# Patient Record
Sex: Female | Born: 1975 | Race: Black or African American | Hispanic: No | Marital: Single | Smoking: Former smoker
Health system: Southern US, Community
[De-identification: ages and names within clinical notes are randomized; demographics above are authoritative.]

## PROBLEM LIST (undated history)

## (undated) DIAGNOSIS — N61 Mastitis without abscess: Secondary | ICD-10-CM

## (undated) DIAGNOSIS — G4730 Sleep apnea, unspecified: Secondary | ICD-10-CM

## (undated) DIAGNOSIS — F1010 Alcohol abuse, uncomplicated: Secondary | ICD-10-CM

## (undated) DIAGNOSIS — F329 Major depressive disorder, single episode, unspecified: Secondary | ICD-10-CM

## (undated) DIAGNOSIS — E739 Lactose intolerance, unspecified: Secondary | ICD-10-CM

## (undated) DIAGNOSIS — F419 Anxiety disorder, unspecified: Secondary | ICD-10-CM

## (undated) DIAGNOSIS — F4310 Post-traumatic stress disorder, unspecified: Secondary | ICD-10-CM

## (undated) DIAGNOSIS — E669 Obesity, unspecified: Secondary | ICD-10-CM

## (undated) DIAGNOSIS — I10 Essential (primary) hypertension: Secondary | ICD-10-CM

## (undated) DIAGNOSIS — M2550 Pain in unspecified joint: Secondary | ICD-10-CM

## (undated) DIAGNOSIS — N611 Abscess of the breast and nipple: Secondary | ICD-10-CM

## (undated) DIAGNOSIS — M549 Dorsalgia, unspecified: Secondary | ICD-10-CM

## (undated) DIAGNOSIS — R600 Localized edema: Secondary | ICD-10-CM

## (undated) DIAGNOSIS — R1310 Dysphagia, unspecified: Secondary | ICD-10-CM

## (undated) DIAGNOSIS — D259 Leiomyoma of uterus, unspecified: Secondary | ICD-10-CM

## (undated) DIAGNOSIS — Z720 Tobacco use: Secondary | ICD-10-CM

## (undated) DIAGNOSIS — K829 Disease of gallbladder, unspecified: Secondary | ICD-10-CM

## (undated) DIAGNOSIS — E282 Polycystic ovarian syndrome: Secondary | ICD-10-CM

## (undated) DIAGNOSIS — K5900 Constipation, unspecified: Secondary | ICD-10-CM

## (undated) DIAGNOSIS — K219 Gastro-esophageal reflux disease without esophagitis: Secondary | ICD-10-CM

## (undated) DIAGNOSIS — R7303 Prediabetes: Secondary | ICD-10-CM

## (undated) DIAGNOSIS — F32A Depression, unspecified: Secondary | ICD-10-CM

## (undated) HISTORY — DX: Localized edema: R60.0

## (undated) HISTORY — DX: Tobacco use: Z72.0

## (undated) HISTORY — DX: Alcohol abuse, uncomplicated: F10.10

## (undated) HISTORY — DX: Depression, unspecified: F32.A

## (undated) HISTORY — PX: INCISION AND DRAINAGE BREAST ABSCESS: SUR672

## (undated) HISTORY — DX: Dorsalgia, unspecified: M54.9

## (undated) HISTORY — DX: Constipation, unspecified: K59.00

## (undated) HISTORY — DX: Dysphagia, unspecified: R13.10

## (undated) HISTORY — DX: Post-traumatic stress disorder, unspecified: F43.10

## (undated) HISTORY — DX: Essential (primary) hypertension: I10

## (undated) HISTORY — DX: Lactose intolerance, unspecified: E73.9

## (undated) HISTORY — DX: Anxiety disorder, unspecified: F41.9

## (undated) HISTORY — DX: Pain in unspecified joint: M25.50

## (undated) HISTORY — DX: Obesity, unspecified: E66.9

## (undated) HISTORY — DX: Disease of gallbladder, unspecified: K82.9

## (undated) HISTORY — DX: Polycystic ovarian syndrome: E28.2

## (undated) HISTORY — DX: Gastro-esophageal reflux disease without esophagitis: K21.9

## (undated) HISTORY — DX: Major depressive disorder, single episode, unspecified: F32.9

## (undated) HISTORY — DX: Abscess of the breast and nipple: N61.1

---

## 1999-07-16 ENCOUNTER — Emergency Department (HOSPITAL_COMMUNITY): Admission: EM | Admit: 1999-07-16 | Discharge: 1999-07-16 | Payer: Self-pay | Admitting: Emergency Medicine

## 1999-09-15 ENCOUNTER — Encounter: Payer: Self-pay | Admitting: Emergency Medicine

## 1999-09-15 ENCOUNTER — Emergency Department (HOSPITAL_COMMUNITY): Admission: EM | Admit: 1999-09-15 | Discharge: 1999-09-15 | Payer: Self-pay | Admitting: Emergency Medicine

## 1999-09-23 ENCOUNTER — Encounter (INDEPENDENT_AMBULATORY_CARE_PROVIDER_SITE_OTHER): Payer: Self-pay | Admitting: Specialist

## 1999-09-23 ENCOUNTER — Ambulatory Visit (HOSPITAL_COMMUNITY): Admission: RE | Admit: 1999-09-23 | Discharge: 1999-09-24 | Payer: Self-pay | Admitting: General Surgery

## 1999-09-23 ENCOUNTER — Encounter: Payer: Self-pay | Admitting: General Surgery

## 2000-03-31 HISTORY — PX: CHOLECYSTECTOMY: SHX55

## 2000-05-06 ENCOUNTER — Emergency Department (HOSPITAL_COMMUNITY): Admission: EM | Admit: 2000-05-06 | Discharge: 2000-05-06 | Payer: Self-pay | Admitting: Emergency Medicine

## 2001-09-13 ENCOUNTER — Emergency Department (HOSPITAL_COMMUNITY): Admission: EM | Admit: 2001-09-13 | Discharge: 2001-09-13 | Payer: Self-pay | Admitting: Emergency Medicine

## 2001-09-19 ENCOUNTER — Emergency Department (HOSPITAL_COMMUNITY): Admission: EM | Admit: 2001-09-19 | Discharge: 2001-09-19 | Payer: Self-pay

## 2002-03-25 ENCOUNTER — Emergency Department (HOSPITAL_COMMUNITY): Admission: EM | Admit: 2002-03-25 | Discharge: 2002-03-25 | Payer: Self-pay | Admitting: Emergency Medicine

## 2002-04-23 ENCOUNTER — Emergency Department (HOSPITAL_COMMUNITY): Admission: EM | Admit: 2002-04-23 | Discharge: 2002-04-24 | Payer: Self-pay | Admitting: Emergency Medicine

## 2002-12-12 ENCOUNTER — Emergency Department (HOSPITAL_COMMUNITY): Admission: EM | Admit: 2002-12-12 | Discharge: 2002-12-12 | Payer: Self-pay | Admitting: Emergency Medicine

## 2003-04-09 ENCOUNTER — Emergency Department (HOSPITAL_COMMUNITY): Admission: EM | Admit: 2003-04-09 | Discharge: 2003-04-09 | Payer: Self-pay | Admitting: Emergency Medicine

## 2003-06-11 ENCOUNTER — Emergency Department (HOSPITAL_COMMUNITY): Admission: EM | Admit: 2003-06-11 | Discharge: 2003-06-11 | Payer: Self-pay | Admitting: Emergency Medicine

## 2004-01-10 ENCOUNTER — Emergency Department (HOSPITAL_COMMUNITY): Admission: EM | Admit: 2004-01-10 | Discharge: 2004-01-10 | Payer: Self-pay | Admitting: Emergency Medicine

## 2004-06-29 ENCOUNTER — Emergency Department (HOSPITAL_COMMUNITY): Admission: EM | Admit: 2004-06-29 | Discharge: 2004-06-29 | Payer: Self-pay | Admitting: Emergency Medicine

## 2004-08-08 ENCOUNTER — Ambulatory Visit: Payer: Self-pay | Admitting: Family Medicine

## 2004-11-20 ENCOUNTER — Emergency Department (HOSPITAL_COMMUNITY): Admission: EM | Admit: 2004-11-20 | Discharge: 2004-11-21 | Payer: Self-pay | Admitting: *Deleted

## 2005-02-04 ENCOUNTER — Emergency Department (HOSPITAL_COMMUNITY): Admission: EM | Admit: 2005-02-04 | Discharge: 2005-02-05 | Payer: Self-pay | Admitting: Emergency Medicine

## 2006-05-28 DIAGNOSIS — F411 Generalized anxiety disorder: Secondary | ICD-10-CM | POA: Insufficient documentation

## 2006-05-28 DIAGNOSIS — K219 Gastro-esophageal reflux disease without esophagitis: Secondary | ICD-10-CM | POA: Insufficient documentation

## 2006-05-28 DIAGNOSIS — I10 Essential (primary) hypertension: Secondary | ICD-10-CM | POA: Insufficient documentation

## 2006-11-08 ENCOUNTER — Emergency Department (HOSPITAL_COMMUNITY): Admission: EM | Admit: 2006-11-08 | Discharge: 2006-11-09 | Payer: Self-pay | Admitting: Emergency Medicine

## 2006-12-11 ENCOUNTER — Ambulatory Visit: Payer: Self-pay | Admitting: Family Medicine

## 2006-12-11 ENCOUNTER — Encounter (INDEPENDENT_AMBULATORY_CARE_PROVIDER_SITE_OTHER): Payer: Self-pay | Admitting: *Deleted

## 2006-12-11 ENCOUNTER — Other Ambulatory Visit: Admission: RE | Admit: 2006-12-11 | Discharge: 2006-12-11 | Payer: Self-pay | Admitting: *Deleted

## 2006-12-11 ENCOUNTER — Telehealth (INDEPENDENT_AMBULATORY_CARE_PROVIDER_SITE_OTHER): Payer: Self-pay | Admitting: *Deleted

## 2006-12-11 LAB — CONVERTED CEMR LAB
Blood in Urine, dipstick: NEGATIVE
Chlamydia, DNA Probe: NEGATIVE
Nitrite: NEGATIVE
Protein, U semiquant: NEGATIVE
Urobilinogen, UA: 0.2
pH: 5.5

## 2006-12-15 ENCOUNTER — Encounter (INDEPENDENT_AMBULATORY_CARE_PROVIDER_SITE_OTHER): Payer: Self-pay | Admitting: *Deleted

## 2006-12-18 ENCOUNTER — Encounter (INDEPENDENT_AMBULATORY_CARE_PROVIDER_SITE_OTHER): Payer: Self-pay | Admitting: *Deleted

## 2007-01-07 ENCOUNTER — Ambulatory Visit: Payer: Self-pay | Admitting: Family Medicine

## 2007-01-08 ENCOUNTER — Encounter: Admission: RE | Admit: 2007-01-08 | Discharge: 2007-01-08 | Payer: Self-pay | Admitting: Sports Medicine

## 2007-01-11 ENCOUNTER — Encounter: Payer: Self-pay | Admitting: Family Medicine

## 2007-01-20 ENCOUNTER — Encounter: Payer: Self-pay | Admitting: Family Medicine

## 2007-01-20 ENCOUNTER — Ambulatory Visit: Payer: Self-pay | Admitting: Family Medicine

## 2007-01-20 LAB — CONVERTED CEMR LAB
ALT: 25 units/L (ref 0–35)
AST: 17 units/L (ref 0–37)
BUN: 10 mg/dL (ref 6–23)
Calcium: 9.2 mg/dL (ref 8.4–10.5)
Chloride: 106 meq/L (ref 96–112)
Cholesterol: 165 mg/dL (ref 0–200)
Creatinine, Ser: 0.91 mg/dL (ref 0.40–1.20)
HDL: 46 mg/dL (ref >39)
Total Bilirubin: 0.2 mg/dL — ABNORMAL LOW (ref 0.3–1.2)
Total CHOL/HDL Ratio: 3.6
VLDL: 17 mg/dL (ref 0–40)
Whiff Test: NEGATIVE

## 2007-02-03 ENCOUNTER — Ambulatory Visit: Payer: Self-pay | Admitting: Family Medicine

## 2007-02-03 DIAGNOSIS — Z6841 Body Mass Index (BMI) 40.0 and over, adult: Secondary | ICD-10-CM | POA: Insufficient documentation

## 2007-03-05 ENCOUNTER — Encounter (INDEPENDENT_AMBULATORY_CARE_PROVIDER_SITE_OTHER): Payer: Self-pay | Admitting: *Deleted

## 2007-04-27 ENCOUNTER — Ambulatory Visit: Payer: Self-pay | Admitting: Family Medicine

## 2007-04-27 ENCOUNTER — Encounter (INDEPENDENT_AMBULATORY_CARE_PROVIDER_SITE_OTHER): Payer: Self-pay | Admitting: Family Medicine

## 2007-04-29 ENCOUNTER — Telehealth: Payer: Self-pay | Admitting: *Deleted

## 2007-05-03 ENCOUNTER — Telehealth: Payer: Self-pay | Admitting: *Deleted

## 2007-05-07 ENCOUNTER — Encounter: Admission: RE | Admit: 2007-05-07 | Discharge: 2007-05-07 | Payer: Self-pay | Admitting: Family Medicine

## 2007-05-10 ENCOUNTER — Ambulatory Visit: Payer: Self-pay | Admitting: Sports Medicine

## 2007-05-13 ENCOUNTER — Telehealth: Payer: Self-pay | Admitting: *Deleted

## 2007-06-15 ENCOUNTER — Telehealth (INDEPENDENT_AMBULATORY_CARE_PROVIDER_SITE_OTHER): Payer: Self-pay | Admitting: Family Medicine

## 2007-06-22 ENCOUNTER — Emergency Department (HOSPITAL_COMMUNITY): Admission: EM | Admit: 2007-06-22 | Discharge: 2007-06-22 | Payer: Self-pay | Admitting: Emergency Medicine

## 2007-08-22 ENCOUNTER — Emergency Department (HOSPITAL_COMMUNITY): Admission: EM | Admit: 2007-08-22 | Discharge: 2007-08-22 | Payer: Self-pay | Admitting: Emergency Medicine

## 2007-10-26 ENCOUNTER — Telehealth: Payer: Self-pay | Admitting: Family Medicine

## 2007-10-28 ENCOUNTER — Emergency Department (HOSPITAL_COMMUNITY): Admission: EM | Admit: 2007-10-28 | Discharge: 2007-10-28 | Payer: Self-pay | Admitting: Family Medicine

## 2008-01-04 ENCOUNTER — Telehealth: Payer: Self-pay | Admitting: *Deleted

## 2008-05-11 ENCOUNTER — Telehealth: Payer: Self-pay | Admitting: Family Medicine

## 2008-05-11 ENCOUNTER — Ambulatory Visit: Payer: Self-pay | Admitting: Family Medicine

## 2008-05-15 ENCOUNTER — Telehealth (INDEPENDENT_AMBULATORY_CARE_PROVIDER_SITE_OTHER): Payer: Self-pay | Admitting: *Deleted

## 2008-05-17 ENCOUNTER — Telehealth (INDEPENDENT_AMBULATORY_CARE_PROVIDER_SITE_OTHER): Payer: Self-pay | Admitting: *Deleted

## 2008-05-23 ENCOUNTER — Encounter: Payer: Self-pay | Admitting: Family Medicine

## 2008-07-10 ENCOUNTER — Emergency Department (HOSPITAL_COMMUNITY): Admission: EM | Admit: 2008-07-10 | Discharge: 2008-07-10 | Payer: Self-pay | Admitting: *Deleted

## 2008-09-23 ENCOUNTER — Emergency Department (HOSPITAL_COMMUNITY): Admission: EM | Admit: 2008-09-23 | Discharge: 2008-09-23 | Payer: Self-pay | Admitting: Emergency Medicine

## 2008-11-01 ENCOUNTER — Ambulatory Visit (HOSPITAL_COMMUNITY): Admission: RE | Admit: 2008-11-01 | Discharge: 2008-11-01 | Payer: Self-pay | Admitting: Family Medicine

## 2008-11-01 ENCOUNTER — Telehealth: Payer: Self-pay | Admitting: *Deleted

## 2008-11-01 ENCOUNTER — Telehealth (INDEPENDENT_AMBULATORY_CARE_PROVIDER_SITE_OTHER): Payer: Self-pay | Admitting: *Deleted

## 2008-11-01 ENCOUNTER — Ambulatory Visit: Payer: Self-pay | Admitting: Family Medicine

## 2008-11-01 DIAGNOSIS — F17200 Nicotine dependence, unspecified, uncomplicated: Secondary | ICD-10-CM

## 2008-11-03 ENCOUNTER — Encounter: Payer: Self-pay | Admitting: Family Medicine

## 2008-11-06 ENCOUNTER — Encounter: Payer: Self-pay | Admitting: Family Medicine

## 2008-11-06 ENCOUNTER — Ambulatory Visit: Payer: Self-pay | Admitting: Family Medicine

## 2008-11-06 ENCOUNTER — Other Ambulatory Visit: Admission: RE | Admit: 2008-11-06 | Discharge: 2008-11-06 | Payer: Self-pay | Admitting: *Deleted

## 2008-11-06 DIAGNOSIS — L301 Dyshidrosis [pompholyx]: Secondary | ICD-10-CM

## 2008-11-08 ENCOUNTER — Emergency Department (HOSPITAL_COMMUNITY): Admission: EM | Admit: 2008-11-08 | Discharge: 2008-11-09 | Payer: Self-pay | Admitting: Emergency Medicine

## 2008-11-08 ENCOUNTER — Encounter: Payer: Self-pay | Admitting: Family Medicine

## 2008-11-16 ENCOUNTER — Encounter: Payer: Self-pay | Admitting: Family Medicine

## 2008-11-24 ENCOUNTER — Ambulatory Visit: Payer: Self-pay | Admitting: Family Medicine

## 2008-12-14 ENCOUNTER — Ambulatory Visit: Payer: Self-pay | Admitting: Family Medicine

## 2008-12-17 ENCOUNTER — Telehealth: Payer: Self-pay | Admitting: Family Medicine

## 2008-12-17 ENCOUNTER — Emergency Department (HOSPITAL_COMMUNITY): Admission: EM | Admit: 2008-12-17 | Discharge: 2008-12-17 | Payer: Self-pay | Admitting: Emergency Medicine

## 2008-12-18 ENCOUNTER — Encounter: Payer: Self-pay | Admitting: Family Medicine

## 2008-12-18 ENCOUNTER — Ambulatory Visit: Payer: Self-pay | Admitting: Family Medicine

## 2008-12-18 ENCOUNTER — Encounter: Payer: Self-pay | Admitting: *Deleted

## 2008-12-18 ENCOUNTER — Telehealth: Payer: Self-pay | Admitting: Family Medicine

## 2008-12-18 ENCOUNTER — Encounter: Admission: RE | Admit: 2008-12-18 | Discharge: 2008-12-18 | Payer: Self-pay | Admitting: Family Medicine

## 2008-12-18 DIAGNOSIS — N61 Mastitis without abscess: Secondary | ICD-10-CM

## 2009-01-04 ENCOUNTER — Telehealth: Payer: Self-pay | Admitting: Family Medicine

## 2009-04-23 ENCOUNTER — Telehealth: Payer: Self-pay | Admitting: *Deleted

## 2009-05-25 ENCOUNTER — Ambulatory Visit: Payer: Self-pay | Admitting: Family Medicine

## 2009-06-01 ENCOUNTER — Ambulatory Visit: Payer: Self-pay | Admitting: Family Medicine

## 2009-06-30 ENCOUNTER — Emergency Department (HOSPITAL_COMMUNITY): Admission: EM | Admit: 2009-06-30 | Discharge: 2009-06-30 | Payer: Self-pay | Admitting: Emergency Medicine

## 2009-09-10 ENCOUNTER — Emergency Department (HOSPITAL_COMMUNITY): Admission: EM | Admit: 2009-09-10 | Discharge: 2009-09-10 | Payer: Self-pay | Admitting: Emergency Medicine

## 2010-02-17 ENCOUNTER — Emergency Department (HOSPITAL_COMMUNITY)
Admission: EM | Admit: 2010-02-17 | Discharge: 2010-02-17 | Payer: Self-pay | Source: Home / Self Care | Admitting: Emergency Medicine

## 2010-03-22 ENCOUNTER — Emergency Department (HOSPITAL_COMMUNITY)
Admission: EM | Admit: 2010-03-22 | Discharge: 2010-03-22 | Payer: Self-pay | Source: Home / Self Care | Admitting: Emergency Medicine

## 2010-04-21 ENCOUNTER — Encounter: Payer: Self-pay | Admitting: Family Medicine

## 2010-04-21 ENCOUNTER — Encounter: Payer: Self-pay | Admitting: Emergency Medicine

## 2010-04-22 ENCOUNTER — Encounter: Payer: Self-pay | Admitting: Family Medicine

## 2010-04-27 IMAGING — CR DG ABDOMEN ACUTE W/ 1V CHEST
3 series · 3 of 3 positions shown · non-contrast
Comparison: Chest dated 02/04/2005.

CLINICAL DATA: Right upper back pain.  Previous cholecystectomy.

ACUTE ABDOMEN SERIES (ABDOMEN 2 VIEW & CHEST 1 VIEW)

[w chest pa]
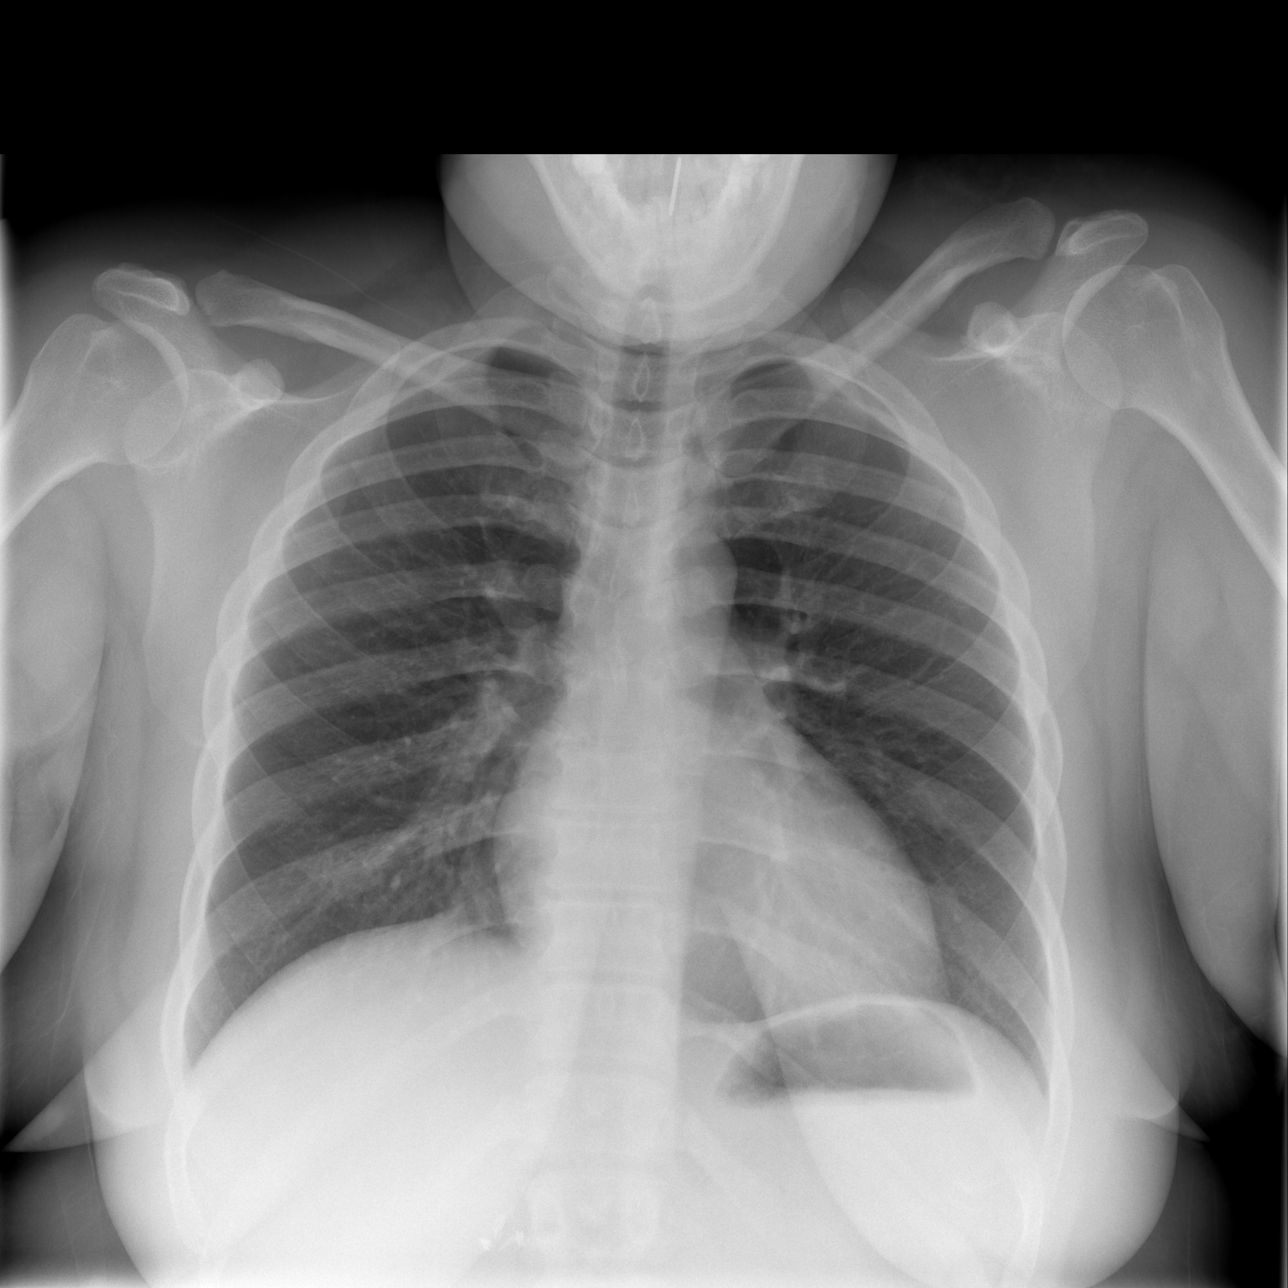

[w abdomen upright]
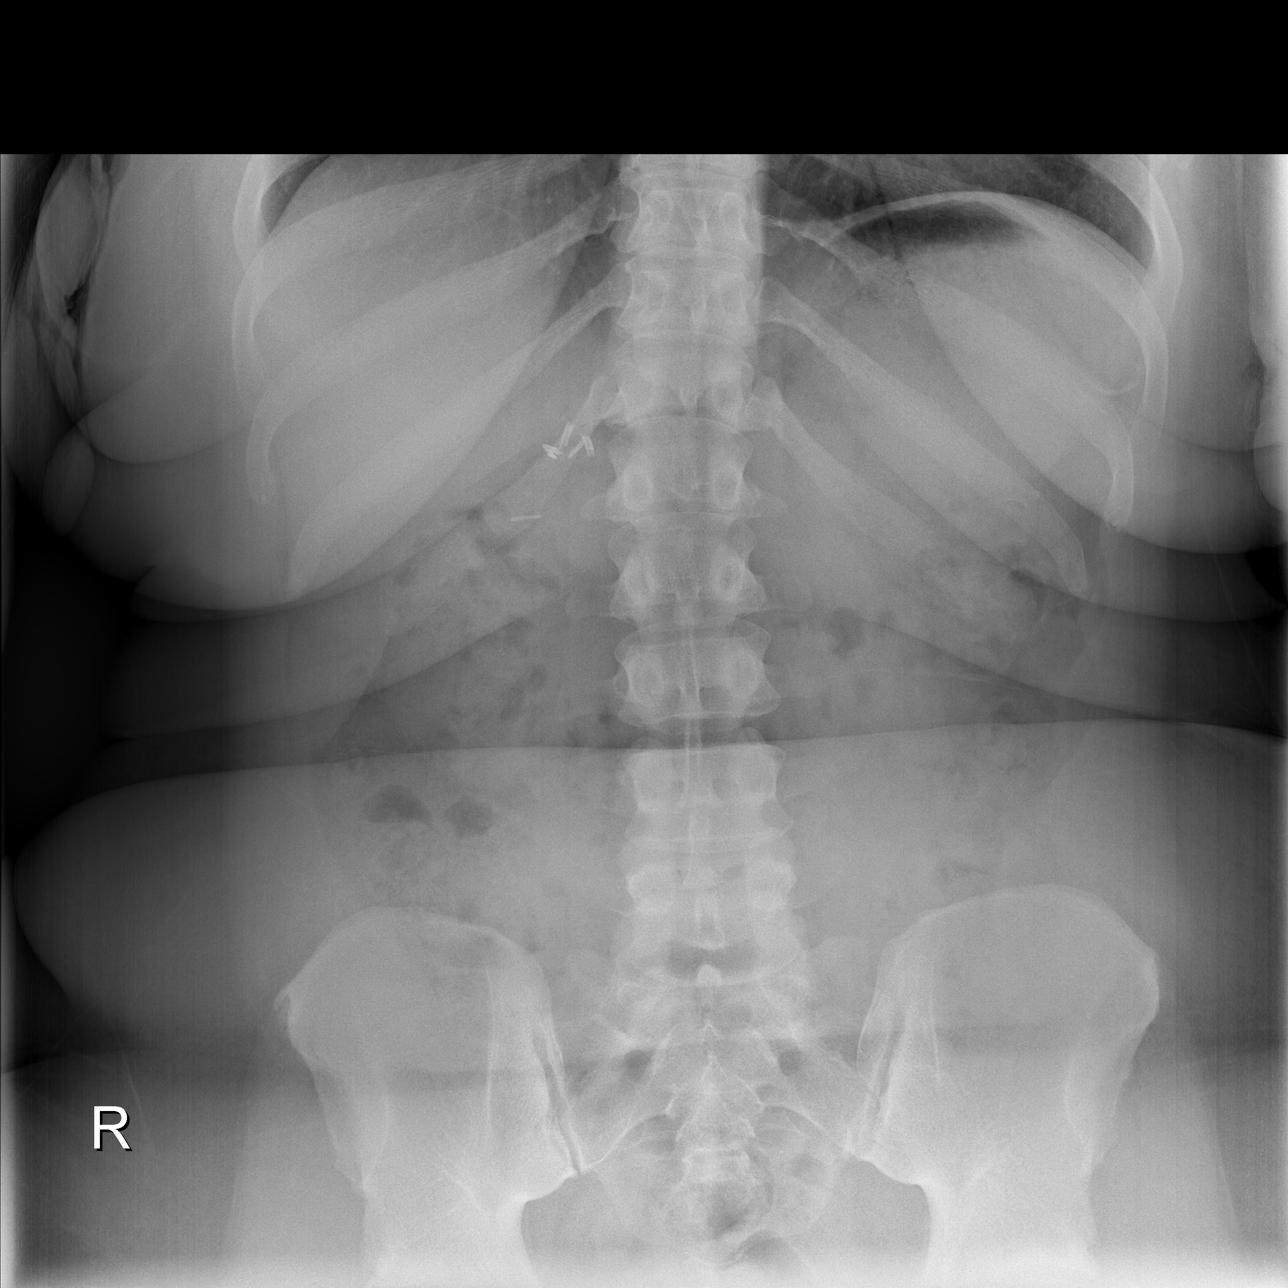

[t abdomen supine]
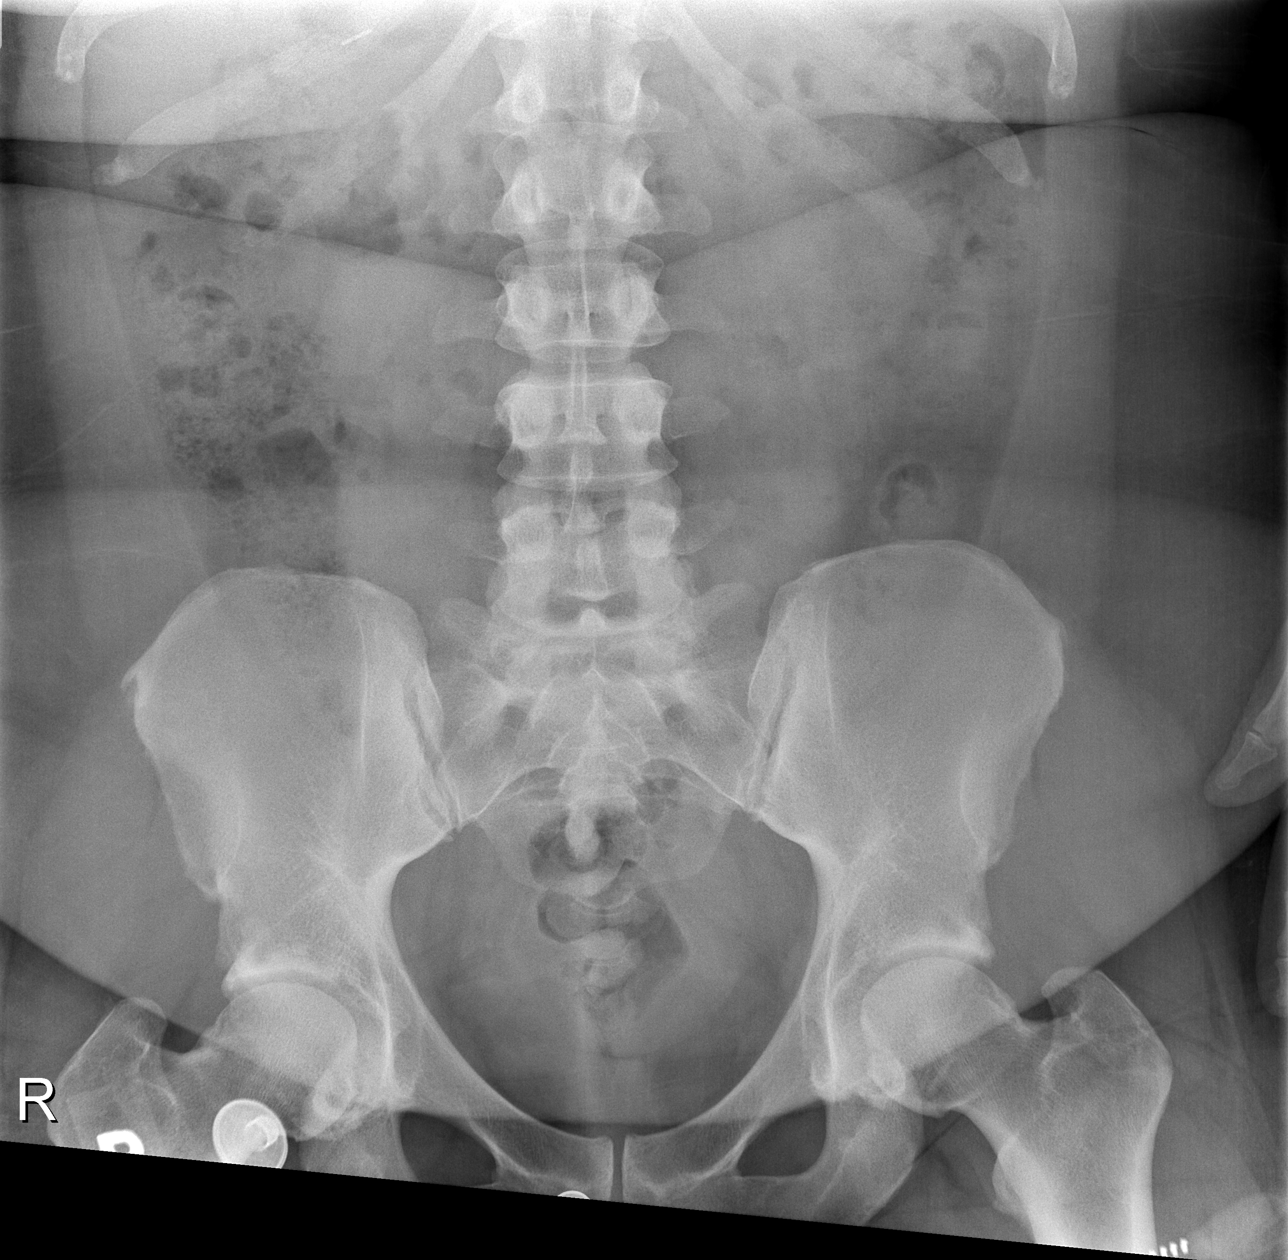

[3 of 3 positions shown; findings below may reference images not displayed]

FINDINGS: Normal sized heart.  Clear lungs.  Normal bowel gas
pattern without free peritoneal air.  Cholecystectomy clips.  Mild
scoliosis, possibly positional.
IMPRESSION: No acute abnormality.

## 2010-04-30 ENCOUNTER — Telehealth (INDEPENDENT_AMBULATORY_CARE_PROVIDER_SITE_OTHER): Payer: Self-pay | Admitting: *Deleted

## 2010-04-30 NOTE — Assessment & Plan Note (Signed)
Summary: bp ck,tcb  Nurse Visit patient in for BP check today because she didn't feel well this morning, "feels like I drank a lot last night and I didn't" feels tired.  In August 2010 she was given 3 refills on HCTZ and has stretched it until now. she has been only  taking off and on. she did take med  yesterday at 9:00 PM but not for few days prior. she currently has 18 tabs left. states she has no insurance and doesn't  qualify for medicaid. gave her Vernell Morgans number to  call. states she has had no period in 2 months . she is certain she is not pregnant due to her lifestyle.Marland Kitchen advised patient that an office visit costs $125.00 and they would like for her to pay half. states she has her tax money and will be able to do that. appointment scheduled next week with Dr. Sharen Hones. Dr. Sheffield Slider consulted on this . BP today 130/98 pulse 76 weight 255.2 Theresia Lo RN  May 25, 2009 12:07 PM   Allergies: No Known Drug Allergies  Orders Added: 1)  No Charge Patient Arrived (NCPA0) [NCPA0]

## 2010-04-30 NOTE — Assessment & Plan Note (Signed)
Summary: BP follow up and needs refill on med/ls   Vital Signs:  Patient profile:   35 year old female Height:      63 inches Weight:      257.6 pounds BMI:     45.80 Pulse rate:   81 / minute BP sitting:   120 / 80  (left arm) Cuff size:   large  Vitals Entered By: Renato Battles slade,cma CC: f/up HTN and refill meds. Is Patient Diabetic? No Pain Assessment Patient in pain? no        Primary Care Provider:  Ardeen Garland  MD  CC:  f/up HTN and refill meds..  History of Present Illness: CC: f/u BP and breast  1. HTN - Today 120/80.  HCTZ 25mg  daily and doing well with it, taking daily.  No recent HA changes, vision changes, CP/tightness, SOB, leg swelling or urinary changes.  Walks on treadmill 30 min every other day.    2. L breast - itching, h/o recurrent abscesses.  not swelling as much as prior to draining.  Last drained 11/2008.  saw surgeon who recommended dial soap and neosporin but per patient didn't provide any long term management plan.  now states draining intermittently, last drained this weekend.  no fevers or systemic symptoms  3. obesity - walking every other day.  Habits & Providers  Alcohol-Tobacco-Diet     Tobacco Status: current     Tobacco Counseling: to quit use of tobacco products  Allergies (verified): No Known Drug Allergies  Past History:  Past Medical History: htn, obesity, tobacco abuse recurrent Left breast abscess lateral to areola PMH-FH-SH reviewed for relevance  Physical Exam  General:  Alert and oriented x3, obese, no acute distress Breasts:  left breast with indurated area with scab no fluctuance lateral to areola.  pruritic.  no rash along skin contact Lungs:  Normal respiratory effort, chest expands symmetrically. Lungs are clear to auscultation, no crackles or wheezes. Heart:  Normal rate and regular rhythm. S1 and S2 normal without gallop, murmur, click, rub or other extra sounds. Extremities:  no edema Skin:  Intact without  suspicious lesions or rashes   Impression & Recommendations:  Problem # 1:  ABSCESS, BREAST, LEFT (ICD-611.0) treat with antibiotics as recently draining.  bactrim DS two times a day x 10 days to cover MRSA.  warm compresses.  use dial soap and neosporin.  pt states gets yeast infections with antibiotics so will provide diflucan ppx  Orders: [facility]- Est  Level 4 (45409)  Problem # 2:  OBESITY (ICD-278.00) continue treadmill walking. Orders: [facility]- Est  Level 4 (81191)  Problem # 3:  HYPERTENSION, BENIGN SYSTEMIC (ICD-401.1)  refilled HCTZ and encouraged BP at goal.  discussed importance of checking labs, pt states she cannot afford to obtain blood work today.  advised to eat plenty of fruits/vegetables to keep K at goal.  Advised HCTZ can drop K.  Her updated medication list for this problem includes:    Hydrochlorothiazide 25 Mg Tabs (Hydrochlorothiazide) .Marland Kitchen... 1 tab by mouth daily  Orders: [facility]- Est  Level 4 (99214)  BP today: 120/80 Prior BP: 110/90 (12/18/2008)  Labs Reviewed: K+: 4.0 (01/20/2007) Creat: : 0.91 (01/20/2007)   Chol: 165 (01/20/2007)   HDL: 46 (01/20/2007)   LDL: 102 (01/20/2007)   TG: 85 (01/20/2007)  Complete Medication List: 1)  Hydrochlorothiazide 25 Mg Tabs (Hydrochlorothiazide) .Marland Kitchen.. 1 tab by mouth daily 2)  Triamcinolone Acetonide 0.1 % Oint (Triamcinolone acetonide) .... Apply to feet once a day.  try to apply after bathing. disp: 80 g tube 3)  Bactrim Ds 800-160 Mg Tabs (Sulfamethoxazole-trimethoprim) .... 2 pills two times a day xx 10 days 4)  Diflucan 100 Mg Tabs (Fluconazole) .... One by mouth daily  Patient Instructions: 1)  I have refilled your HCTZ. 2)  Remember to take as much fruits and vegetables as possible - 3-5 servings daily. 3)  Keep up the good work with walking. 4)  Warm compresses.  Keep using dial soap and neosporin ointment for breast.  Antibiotic for 10 days bactrim 2 pills twice a day.  and Diflucan in case you get yeast  infection. 5)  Call clinic with questions. Prescriptions: DIFLUCAN 100 MG TABS (FLUCONAZOLE) one by mouth daily  #1 x 0   Entered and Authorized by:   Eustaquio Boyden  MD   Signed by:   Eustaquio Boyden  MD on 06/01/2009   Method used:   Electronically to        [facility] [address] (retail)       [address]       [place], [place]  [id number]       Ph: [phone]       Fax: [phone]   RxID:   [id number] BACTRIM DS 800-160 MG TABS (SULFAMETHOXAZOLE-TRIMETHOPRIM) 2 pills two times a day xx 10 days  #40 x 0   Entered and Authorized by:   Eustaquio Boyden  MD   Signed by:   Eustaquio Boyden  MD on 06/01/2009   Method used:   Electronically to        [facility] [facility] (retail)       [address]       [place], [place]  [id number]       Ph: [phone]       Fax: [phone]   RxID:   [id number] HYDROCHLOROTHIAZIDE 25 MG  TABS (HYDROCHLOROTHIAZIDE) 1 tab by mouth daily  #30 x 5   Entered and Authorized by:   Eustaquio Boyden  MD   Signed by:   Eustaquio Boyden  MD on 06/01/2009   Method used:   Electronically to        [facility] (retail)       [address]       [place], [place]  [address]       Ph: [phone]       Fax: [phone]   RxID:   [id number]

## 2010-04-30 NOTE — Progress Notes (Signed)
Summary: pt needs OV with Dr.Mayans/TS  Phone Note Call from Patient Call back at Home Phone [phone]   Caller: Patient Summary of Call: Pt wondering if she can talk to Dr. Georgiana Shore about her refill for her bp meds.   Initial call taken by: Clydell Hakim,  April 23, 2009 9:13 AM  Follow-up for Phone Call        called pt and lmvm to sched. OV with Dr.Mayans and we may refill her BP meds until the appt.  waiting for pt to call back. Follow-up by: Arlyss Repress CMA,,  April 23, 2009 12:14 PM

## 2010-05-08 NOTE — Progress Notes (Signed)
Summary: refill  Phone Note Refill Request Call back at Home Phone [phone] Message from:  Patient  Refills Requested: Medication #1:  HYDROCHLOROTHIAZIDE 25 MG  TABS 1 tab by mouth daily pt is waiting on her medicaid or get her tax refund so she can make an appt [facility]  Initial call taken by: De Nurse,  April 30, 2010 1:46 PM  Follow-up for Phone Call        rx sent to pharmacy . called and advised patient that she will appointment before further refills . she hopes to hear from her medicaid  within a couple weeks and will call to schedule. Follow-up by: Theresia Lo RN,  April 30, 2010 2:59 PM

## 2010-05-22 ENCOUNTER — Encounter: Payer: Self-pay | Admitting: *Deleted

## 2010-05-31 ENCOUNTER — Telehealth: Payer: Self-pay | Admitting: Family Medicine

## 2010-05-31 DIAGNOSIS — I10 Essential (primary) hypertension: Secondary | ICD-10-CM

## 2010-05-31 MED ORDER — HYDROCHLOROTHIAZIDE 25 MG PO TABS: 25.0000 mg | ORAL_TABLET | Freq: Every day | ORAL | Status: DC

## 2010-05-31 NOTE — Telephone Encounter (Signed)
Will be out of HCTZ - has appt on Wednesday [facility]

## 2010-05-31 NOTE — Telephone Encounter (Signed)
Patient notified that will send in 7 tabs to last until hr appointment.

## 2010-06-05 ENCOUNTER — Encounter: Payer: Self-pay | Admitting: Family Medicine

## 2010-06-05 ENCOUNTER — Other Ambulatory Visit: Payer: Self-pay | Admitting: Family Medicine

## 2010-06-05 ENCOUNTER — Other Ambulatory Visit (HOSPITAL_COMMUNITY)
Admission: RE | Admit: 2010-06-05 | Discharge: 2010-06-05 | Disposition: A | Discharge status: Home / Self Care | Payer: Medicaid Other | Source: Ambulatory Visit | Attending: Family Medicine | Admitting: Family Medicine

## 2010-06-05 ENCOUNTER — Ambulatory Visit (INDEPENDENT_AMBULATORY_CARE_PROVIDER_SITE_OTHER): Payer: Medicaid Other | Admitting: Family Medicine

## 2010-06-05 DIAGNOSIS — Z124 Encounter for screening for malignant neoplasm of cervix: Secondary | ICD-10-CM

## 2010-06-05 DIAGNOSIS — I10 Essential (primary) hypertension: Secondary | ICD-10-CM

## 2010-06-05 DIAGNOSIS — R198 Other specified symptoms and signs involving the digestive system and abdomen: Secondary | ICD-10-CM

## 2010-06-05 DIAGNOSIS — Z01419 Encounter for gynecological examination (general) (routine) without abnormal findings: Secondary | ICD-10-CM | POA: Insufficient documentation

## 2010-06-05 LAB — CONVERTED CEMR LAB: GC Probe Amp, Genital: NEGATIVE

## 2010-06-05 NOTE — Patient Instructions (Signed)
Let me know when you are ready to quit tobacco.  Also I think that you would be a lot healthier if you lost some weight. I am happy to help you with this.  Please come see me in 3 months to check your blood pressure and to check for diabetes.

## 2010-06-05 NOTE — Progress Notes (Signed)
Claudia Wall is here for a well woman exam:  Mensuration: Historically irregular. LMP was in Nov 2011. No sex with a man in 13 years. No risk for pregnancy. Has been told her irregular cycles were due to obesity. She is not on OCPs as she smokes and does not have sex with men.   Breast: Has history of left breast abscess. Occasionally drains. Has been I&D by [facility] a while ago. Has been worked up by the breast center as well. She does not want referral back there any time soon.   Paps: Last papsmear was over 1 year ago. It has never been abnormal.   STI: No history of STI. Would like GC/CHL testing today. Notes occasional discharge.   HTN: Stable. Takes HCTZ. No regular doctor here at [facility]. No palpitations or chest pain.   Headaches: Occasionally.  2 a month. Takes NSAIDS. Denies any aura.   Tobacco: Smoke 1/3 PPD for 20 years. Would like to quit eventually.   ROS: As above. No fevers or chills.   Exam: VS noted and rechecked.  GEN: Obese woman in NAD Lungs: CTABL Heart: RRR no MRG Breast: Left breast with hypopigmented dime sized patch lateral to the nipple Mildly TTP. Small amount of yellow fluid expressible. No surrounding erythremia or induration.  Pelvis: Ext genitalia normal appearing. Vaginal normal. Cervix is normal appearing. Small white discharge. Cervix swabbed for PAP and GC/CHL. No cervical motion tenderness. Bimanual exam with not revealing due to body habitus.  Exts: Obese. NT, ND Neuro/Psych: AOx3. Pleasant woman with supportive partner in the room.  ABD: NABS, NT, ND

## 2010-06-05 NOTE — Telephone Encounter (Signed)
Refill request

## 2010-06-06 ENCOUNTER — Encounter: Payer: Self-pay | Admitting: Family Medicine

## 2010-06-06 LAB — GC/CHLAMYDIA PROBE AMP, GENITAL
Chlamydia, DNA Probe: NEGATIVE
GC Probe Amp, Genital: NEGATIVE

## 2010-06-06 NOTE — Telephone Encounter (Signed)
Please review and refill

## 2010-06-12 ENCOUNTER — Other Ambulatory Visit: Payer: Medicaid Other

## 2010-06-12 DIAGNOSIS — E669 Obesity, unspecified: Secondary | ICD-10-CM

## 2010-06-12 LAB — COMPREHENSIVE METABOLIC PANEL
ALT: 30 U/L (ref 0–35)
AST: 19 U/L (ref 0–37)
Albumin: 4.4 g/dL (ref 3.5–5.2)
Alkaline Phosphatase: 76 U/L (ref 39–117)
Potassium: 3.8 mEq/L (ref 3.5–5.3)
Sodium: 136 mEq/L (ref 135–145)
Total Bilirubin: 0.3 mg/dL (ref 0.3–1.2)
Total Protein: 7.6 g/dL (ref 6.0–8.3)

## 2010-06-12 LAB — CONVERTED CEMR LAB
ALT: 30 units/L (ref 0–35)
AST: 19 units/L (ref 0–37)
Albumin: 4.4 g/dL (ref 3.5–5.2)
Calcium: 9.6 mg/dL (ref 8.4–10.5)
Chloride: 100 meq/L (ref 96–112)
Creatinine, Ser: 1.12 mg/dL (ref 0.40–1.20)
Potassium: 3.8 meq/L (ref 3.5–5.3)
Sodium: 136 meq/L (ref 135–145)
Total CHOL/HDL Ratio: 3.9
Total Protein: 7.6 g/dL (ref 6.0–8.3)

## 2010-06-12 LAB — LIPID PANEL
LDL Cholesterol: 107 mg/dL — ABNORMAL HIGH (ref 0–99)
VLDL: 24 mg/dL (ref 0–40)

## 2010-07-10 LAB — DIFFERENTIAL
Eosinophils Relative: 1 % (ref 0–5)
Lymphocytes Relative: 46 % (ref 12–46)
Lymphs Abs: 3.7 10*3/uL (ref 0.7–4.0)
Monocytes Absolute: 0.5 10*3/uL (ref 0.1–1.0)
Monocytes Relative: 6 % (ref 3–12)

## 2010-07-10 LAB — COMPREHENSIVE METABOLIC PANEL
AST: 20 U/L (ref 0–37)
Albumin: 3.6 g/dL (ref 3.5–5.2)
Calcium: 9 mg/dL (ref 8.4–10.5)
Chloride: 103 mEq/L (ref 96–112)
Creatinine, Ser: 1.23 mg/dL — ABNORMAL HIGH (ref 0.4–1.2)
GFR calc Af Amer: >60 mL/min (ref >60)

## 2010-07-10 LAB — LIPASE, BLOOD: Lipase: 23 U/L (ref 11–59)

## 2010-07-10 LAB — URINE MICROSCOPIC-ADD ON

## 2010-07-10 LAB — URINALYSIS, ROUTINE W REFLEX MICROSCOPIC
Glucose, UA: NEGATIVE mg/dL
Leukocytes, UA: NEGATIVE
Protein, ur: NEGATIVE mg/dL
Specific Gravity, Urine: 1.02 (ref 1.005–1.030)

## 2010-07-10 LAB — POCT I-STAT, CHEM 8
Creatinine, Ser: 1.3 mg/dL — ABNORMAL HIGH (ref 0.4–1.2)
Glucose, Bld: 101 mg/dL — ABNORMAL HIGH (ref 70–99)
Hemoglobin: 13.6 g/dL (ref 12.0–15.0)
TCO2: 28 mmol/L (ref 0–100)

## 2010-07-10 LAB — CBC
MCHC: 34.6 g/dL (ref 30.0–36.0)
MCV: 93.4 fL (ref 78.0–100.0)
Platelets: 262 10*3/uL (ref 150–400)
WBC: 8.1 10*3/uL (ref 4.0–10.5)

## 2010-08-16 NOTE — Op Note (Signed)
[facility]  Patient:    Claudia Wall, Claudia Wall                      MRN: [id number] Proc. Date: 09/23/99 Adm. Date:  [id number] Disc. Date: [id number] Attending:  Henrene Dodge CC:         Anselm Pancoast. Zachery Dakins, M.D.             patients chart                           Operative Report  PREOPERATIVE DIAGNOSIS:  Chronic cholecystitis.  POSTOPERATIVE DIAGNOSIS:  Chronic cholecystitis.  OPERATION PERFORMED:  Laparoscopic cholecystectomy with cholangiogram.  SURGEON:  Anselm Pancoast. Zachery Dakins, M.D.  ASSISTANT:  Currie Paris, M.D.  ANESTHESIA: General.  BRIEF HISTORY:  Avo Schlachter is a 35 year old female who I saw last week after she had been seen in the emergency room at [facility] for severe epigastric pain.  Her liver function studies were normal, but she had an ultrasound that showed stones within her gallbladder and she was referred to me for a cholecystectomy.  She stated that she has had previous episodes of pain which occurred after eating, usually late in the evening.  When she was seen in the emergency room at [facility] her amylase was 126, lipase was 20, white count was 12,900.  I recommended we proceed with a laparoscopic cholecystectomy and she was scheduled for today.  Preoperatively, she was given 3 grams of Unasyn and stockings.  DESCRIPTION OF OPERATION:  She was taken to the operative suite and inducted under general anesthesia via endotracheal tube.  The abdomen was prepped with Betadine surgical scrub and solution and draped in a sterile manner.  The patient was fairly overweight and a small incision was made just below the umbilicus, down through the fascia and picked up between two cuffs, a small opening made.  The underlying posterior rectus fascia was identified and a small opening made through this and opened into the peritoneal cavity.  A retraction suture was placed and the cannula introduced.  In the upper  10 mm trocar was placed on direct vision through the midline and two lateral 5 mm trocars were placed at the anterior axillary line.  The gallbladder was grasped, retracted upward and outward.  You could see the numerous stones within. The gallbladder itself was mildly dilated but not tipped, with a long cystic duct and a wide cystic duct.  I dissected down from the transition to the gallbladder to the cystic duct and placed a clip and flushed this and made a small opening and only got while bile out of the cystic duct.  We then continued to section on down more proximal, and as far as where the transition was from the cystic duct to the common bile duct was very difficult because of her size and anatomy.  I kept sort of milking down.  I did an x-ray that showed no evidence of any flow through the cystic duct and then on down further was able to free up a little stone that was very low in the cystic duct and flushed it and then I could get a threaded catheter in the duct and held in place with a clip.  A cholangiogram was then obtained and there was good flow into the common bile duct to the duodenum and it flushes quite easily. We then removed the cystic duct.  We basically transected the cystic duct and repeated trying to get a catheter in.  I placed three clips proximal but still a centimeter or so from from the junction of the common bile duct. The cystic artery was doubly clipped proximally, and distally divided and the gallbladder was freed from its bed.  The gallbladder was grasped and brought up at the umbilicus, the camera in the upper 10 mm port.  The fascia and umbilicus was closed with two figure of eights of ) Vicryl.  The irrigated fluid was aspirated.  There was good hemostasis and two lateral 5 mm trocars were withdrawn.  I had anesthetized the fascia through the trocar to release the carbon dioxide and the subcutaneous wounds were closed with 4-0 Vicryl. Benzoin and  Steri-Strips were placed on the skin and the patient tolerated the procedure and was extubated and taken to the recovery room in stable postoperative condition.  Hopefully she will be ready for release in the a.m. and hopefully will have no further episodes of biliary colic. DD:  09/23/99 TD:  09/24/99 Job: [id number]

## 2010-08-20 ENCOUNTER — Ambulatory Visit (INDEPENDENT_AMBULATORY_CARE_PROVIDER_SITE_OTHER): Payer: Self-pay | Admitting: Family Medicine

## 2010-08-20 ENCOUNTER — Encounter: Payer: Self-pay | Admitting: Family Medicine

## 2010-08-20 DIAGNOSIS — I10 Essential (primary) hypertension: Secondary | ICD-10-CM

## 2010-08-20 DIAGNOSIS — N61 Mastitis without abscess: Secondary | ICD-10-CM

## 2010-08-20 MED ORDER — HYDROCHLOROTHIAZIDE 25 MG PO TABS: 25.0000 mg | ORAL_TABLET | Freq: Every day | ORAL | Status: DC

## 2010-08-20 MED ORDER — DOXYCYCLINE HYCLATE 100 MG PO TABS: 100.0000 mg | ORAL_TABLET | Freq: Two times a day (BID) | ORAL | Status: AC

## 2010-08-20 NOTE — Assessment & Plan Note (Signed)
Chronic issue: Not drain able today.  Plan Doxy x 7 days and instructions to follow up with [facility].  If not able to get in will refer patient.  I feel that she needs eval for this abscess and possibly excision of cyst. Additionally they may elect to send sample to path to prove is not malignancy causing this issue. Stressed this importance to the patient who expresses understanding.

## 2010-08-20 NOTE — Assessment & Plan Note (Signed)
Bp elevated due to medication non-adherence. When Claudia Wall takes the medication her BP is within range.  Plan: Encourage her to take the medication. Gave examples of ways to remember.  Will f/u in 1 month. Red flags reviewed.

## 2010-08-20 NOTE — Patient Instructions (Addendum)
Thank you for coming in today. Go back and see the surgeons about your breast.  Also please take your blood pressure medications regularly.  Try using a pill box to keep track of your medications.  Come back in 1 month for a follow up.

## 2010-08-20 NOTE — Progress Notes (Signed)
1) breast Abscess: Left breast. This has been an ongoing issue for 2 years. Now. It started hurting and draining a few weeks ago. She has been to [facility] for I&D and evaluation of this issue before. She does not want it drained today. No fevers or chills. Feels well otherwise. If frustrated.   2) HTN: Took her HCTZ this afternoon but has missed many doses before today. Feels well enough. No chest pain, SOB, palpitations or syncope.   PMH reviewed.  ROS as above otherwise neg  Exam:  Vs noted.  Gen: Well NAD Lungs: CTABL Nl WOB Heart: RRR no MRG Abd: NABS, NT, ND Exts: Non edematous BL  LE Skin: 3cm indurated erythematous abscess. No material expressible. No fluctuance noted. Tender.  Heme: No LAD in right or left axilla.

## 2010-08-28 ENCOUNTER — Other Ambulatory Visit (INDEPENDENT_AMBULATORY_CARE_PROVIDER_SITE_OTHER): Payer: Self-pay | Admitting: Surgery

## 2010-08-28 ENCOUNTER — Encounter (HOSPITAL_COMMUNITY): Payer: Self-pay

## 2010-08-28 ENCOUNTER — Ambulatory Visit (HOSPITAL_COMMUNITY)
Admission: RE | Admit: 2010-08-28 | Discharge: 2010-08-28 | Disposition: A | Discharge status: Home / Self Care | Payer: Self-pay | Source: Ambulatory Visit | Attending: Surgery | Admitting: Surgery

## 2010-08-28 DIAGNOSIS — Z01818 Encounter for other preprocedural examination: Secondary | ICD-10-CM

## 2010-08-28 DIAGNOSIS — N61 Mastitis without abscess: Secondary | ICD-10-CM | POA: Insufficient documentation

## 2010-08-28 DIAGNOSIS — Z01812 Encounter for preprocedural laboratory examination: Secondary | ICD-10-CM | POA: Insufficient documentation

## 2010-08-28 LAB — BASIC METABOLIC PANEL
BUN: 14 mg/dL (ref 6–23)
CO2: 28 mEq/L (ref 19–32)
Calcium: 10.7 mg/dL — ABNORMAL HIGH (ref 8.4–10.5)
Creatinine, Ser: 1.07 mg/dL (ref 0.4–1.2)
Glucose, Bld: 109 mg/dL — ABNORMAL HIGH (ref 70–99)

## 2010-08-28 LAB — CBC
MCH: 30.7 pg (ref 26.0–34.0)
MCHC: 34.5 g/dL (ref 30.0–36.0)
Platelets: 393 10*3/uL (ref 150–400)
RDW: 12.7 % (ref 11.5–15.5)

## 2010-08-28 LAB — HCG, SERUM, QUALITATIVE: Preg, Serum: NEGATIVE

## 2010-08-29 ENCOUNTER — Other Ambulatory Visit (INDEPENDENT_AMBULATORY_CARE_PROVIDER_SITE_OTHER): Payer: Self-pay | Admitting: Surgery

## 2010-08-29 ENCOUNTER — Ambulatory Visit (HOSPITAL_COMMUNITY)
Admission: RE | Admit: 2010-08-29 | Discharge: 2010-08-29 | Disposition: A | Discharge status: Home / Self Care | Payer: Self-pay | Source: Ambulatory Visit | Attending: Surgery | Admitting: Surgery

## 2010-08-29 DIAGNOSIS — N6089 Other benign mammary dysplasias of unspecified breast: Secondary | ICD-10-CM | POA: Insufficient documentation

## 2010-08-29 DIAGNOSIS — I10 Essential (primary) hypertension: Secondary | ICD-10-CM | POA: Insufficient documentation

## 2010-08-29 DIAGNOSIS — Z79899 Other long term (current) drug therapy: Secondary | ICD-10-CM | POA: Insufficient documentation

## 2010-08-29 DIAGNOSIS — N63 Unspecified lump in unspecified breast: Secondary | ICD-10-CM | POA: Insufficient documentation

## 2010-08-29 DIAGNOSIS — N61 Mastitis without abscess: Secondary | ICD-10-CM | POA: Insufficient documentation

## 2010-08-29 DIAGNOSIS — Z6841 Body Mass Index (BMI) 40.0 and over, adult: Secondary | ICD-10-CM | POA: Insufficient documentation

## 2010-09-01 NOTE — Op Note (Signed)
  Claudia Wall, RINDFLEISCH               ACCOUNT NO.:  [id number]  MEDICAL RECORD NO.:  [id number]           PATIENT TYPE:  O  LOCATION:  DAYL                         [facility]AC[facility]  PHYSICIAN:  Sandria Bales. Ezzard Standing, M.D.  DATE OF BIRTH:  1976/03/15  DATE OF PROCEDURE:  08/29/2010                               OPERATIVE REPORT   PRE OP DIAGNOSIS:  Recurrent left breast abscess  POST OP DIAGNOSIS:  Recurrent left breast abscess  PROCEDURE:  Excision of chronically inflamed left breast tissue  SURGEON:  Ovidio Kin  ANESTHESIA:  General anesthesia  HISTORY OF ILLNESS:  Ms. Olvey is a 35 year old African American female who sees Dr. Lorin Picket at the [facility] as her primary medical doctor.  She is actually seen by several of my partners before for recurrent left breast abscess.  She said this flares up about every month, then gets better, usually drains spontaneously.  When it gets done draining spontaneously, she either goes to the ER or is seen in our office.  When I last saw her, she was again with a recurrent abscess, really did not allow much of an exam in the office.  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  I discussed with her the indications and potential complications of surgery.  Potential complications of surgery include that she will have a defect where I excise this area, that she could have some nerve injury, and that even though usually excising these tracts, the wounds heal okay, she could have recurrent abscess.  OPERATIVE NOTE:  The patient in supine position in the operating room #1.  Dr. Sherrian Divers is the acting anesthesiologist.  I prepped the left breast with Betadine solution and sterilely draped her.  Held a time-out and ran a surgical checklist.  I then excised a palpable mass/tract that was consistent with chronically inflamed breast tissue.  This tract/inflammation went from the edge of  areola up to her nipple. This was about 3 or 4 cm long.  The inflamed area  did not go into the depth of her breast.  I think this is a chronically inflamed ductal system which is chronically infected.    After the excision, I injected the wound with a long-acting 0.25% Marcaine.  I used 27 cc of this.  I then placed a Betadine soaked gauze in an open incision which she will take out tomorrow and start  twice a day dressing changes.  She will see me back in 1 week for wound check.  At the end of the procedure, the sponge and needle count were correct and she was transferred to the recovery room in good condition.   Sandria Bales. Ezzard Standing, M.D., FACS   DHN/MEDQ  D:  08/29/2010  T:  08/30/2010  Job:  [id number]  cc:   Dr. Chinita Pester [facility]  Electronically Signed by Ovidio Kin M.D. on 09/01/2010 21:30:86 PM

## 2010-09-18 ENCOUNTER — Encounter (INDEPENDENT_AMBULATORY_CARE_PROVIDER_SITE_OTHER): Payer: Self-pay | Admitting: Surgery

## 2010-09-27 ENCOUNTER — Encounter (INDEPENDENT_AMBULATORY_CARE_PROVIDER_SITE_OTHER): Payer: Self-pay | Admitting: Surgery

## 2010-12-25 LAB — DIFFERENTIAL
Basophils Absolute: 0
Basophils Relative: 1
Monocytes Absolute: 0.6
Neutro Abs: 4.1
Neutrophils Relative %: 49

## 2010-12-25 LAB — CBC
Hemoglobin: 13.4
MCHC: 34.2
Platelets: 300
RDW: 12.2

## 2010-12-25 LAB — POCT I-STAT, CHEM 8
Calcium, Ion: 1.16
Chloride: 103
HCT: 40
TCO2: 27

## 2011-01-13 LAB — I-STAT 8, (EC8 V) (CONVERTED LAB)
Acid-Base Excess: 2
BUN: 6
Bicarbonate: 27.4 — ABNORMAL HIGH
Chloride: 105
Glucose, Bld: 107 — ABNORMAL HIGH
HCT: 45
Hemoglobin: 15.3 — ABNORMAL HIGH
Operator id: 277751
Potassium: 3.6
Sodium: 141
TCO2: 29
pCO2, Ven: 43.5 — ABNORMAL LOW
pH, Ven: 7.408 — ABNORMAL HIGH

## 2011-01-13 LAB — URINALYSIS, ROUTINE W REFLEX MICROSCOPIC
Bilirubin Urine: NEGATIVE
Glucose, UA: NEGATIVE
Hgb urine dipstick: NEGATIVE
Nitrite: NEGATIVE
Specific Gravity, Urine: 1.03
Urobilinogen, UA: 0.2
pH: 6

## 2011-01-13 LAB — POCT PREGNANCY, URINE
Operator id: 277751
Preg Test, Ur: NEGATIVE

## 2011-01-13 LAB — D-DIMER, QUANTITATIVE: D-Dimer, Quant: <0.22

## 2011-01-13 LAB — POCT I-STAT CREATININE
Creatinine, Ser: 0.9
Operator id: 277751

## 2011-06-30 ENCOUNTER — Telehealth: Payer: Self-pay | Admitting: Family Medicine

## 2011-06-30 DIAGNOSIS — I10 Essential (primary) hypertension: Secondary | ICD-10-CM

## 2011-06-30 NOTE — Telephone Encounter (Addendum)
Ms. Lerette is out of refills for her hctz.  Need to have enough until appt on 4/10.  Please call her if any problem with request.  Pharmacy is [facility] on [address].  Patient calling back to ask if she could get refill on hctz before her appt.  Completely out of med.

## 2011-07-01 MED ORDER — HYDROCHLOROTHIAZIDE 25 MG PO TABS: 25.0000 mg | ORAL_TABLET | Freq: Every day | ORAL | Status: DC

## 2011-07-01 NOTE — Telephone Encounter (Signed)
Patient states she has been on HCTZ regularly since  last visit.  Advised will send in enough to last until appointment.

## 2011-07-04 ENCOUNTER — Encounter (HOSPITAL_COMMUNITY): Payer: Self-pay | Admitting: *Deleted

## 2011-07-04 ENCOUNTER — Telehealth: Payer: Self-pay | Admitting: Family Medicine

## 2011-07-04 ENCOUNTER — Emergency Department (INDEPENDENT_AMBULATORY_CARE_PROVIDER_SITE_OTHER)
Admission: EM | Admit: 2011-07-04 | Discharge: 2011-07-04 | Disposition: A | Discharge status: Home / Self Care | Payer: Medicaid Other | Source: Home / Self Care | Attending: Family Medicine | Admitting: Family Medicine

## 2011-07-04 DIAGNOSIS — N611 Abscess of the breast and nipple: Secondary | ICD-10-CM

## 2011-07-04 DIAGNOSIS — N61 Mastitis without abscess: Secondary | ICD-10-CM

## 2011-07-04 MED ORDER — SULFAMETHOXAZOLE-TRIMETHOPRIM 800-160 MG PO TABS: 1.0000 | ORAL_TABLET | Freq: Two times a day (BID) | ORAL | Status: DC

## 2011-07-04 MED ORDER — OXYCODONE-ACETAMINOPHEN 5-325 MG PO TABS: ORAL_TABLET | ORAL | Status: DC

## 2011-07-04 MED ORDER — NAPROXEN 500 MG PO TABS: 500.0000 mg | ORAL_TABLET | Freq: Two times a day (BID) | ORAL | Status: DC

## 2011-07-04 NOTE — ED Notes (Signed)
Pt is here with complaints of red, swollen right breast.  Pt has hx of mastitis with surgical repair in may of 2012 at [facility].

## 2011-07-04 NOTE — Telephone Encounter (Signed)
Patient is requesting to speak to a nurse because of her symptoms of Masstitis.  She doesn't know what she needs to do.

## 2011-07-04 NOTE — Discharge Instructions (Signed)
Take naproxen as directed, with meals, for baseline pain control, and while you are at work. Use the other pain medication for severe pain not controlled by the naproxen and when you are not working or driving. Please follow up with [facility] for further evaluation and management.

## 2011-07-04 NOTE — Telephone Encounter (Signed)
Spoke with patient . She states she had surgery last  May for duct removal. Now breast is painful, hard and swollen. Skin feels warm to touch. Symptoms started last night . Advised patient she needs to go to  [facility] now for treatment. She voices understanding.

## 2011-07-04 NOTE — ED Provider Notes (Signed)
History     CSN: [id number]  Arrival date & time 07/04/11  1651   First MD Initiated Contact with Patient 07/04/11 1712      Chief Complaint  Patient presents with  . Breast Problem    (Consider location/radiation/quality/duration/timing/severity/associated sxs/prior treatment) HPI Comments: Claudia Wall presents for evaluation of pain, swelling, redness, and induration of her LEFT breast. She reports that she had surgery on her LEFT breast last year at [facility], for a "clogged duct" with repeated infections. She denies any problems since that time until now, when she developed sudden onset of pain, redness, and swelling around the areola.  Patient is a 36 y.o. female presenting with abscess. The history is provided by the patient.  Abscess  This is a new problem. The current episode started yesterday. The problem has been unchanged. The abscess is present on the torso. The problem is severe. The abscess is characterized by painfulness, redness and swelling. It is unknown what she was exposed to.    Past Medical History  Diagnosis Date  . Obesity   . Breast abscess   . HTN (hypertension)   . Irregular periods/menstrual cycles   . Tobacco abuse   . Obesity   . Tobacco abuse     Past Surgical History  Procedure Date  . Incision and drainage breast abscess   . Cholecystectomy 2002    Family History  Problem Relation Age of Onset  . Hypertension Father   . Cancer Mother   . Hypertension Mother     History  Substance Use Topics  . Smoking status: Current Everyday Smoker -- 0.3 packs/day    Types: Cigarettes  . Smokeless tobacco: Not on file  . Alcohol Use: 1.8 oz/week    3 Glasses of wine per week    OB History    Grav Para Term Preterm Abortions TAB SAB Ect Mult Living                  Review of Systems  Constitutional: Negative.   HENT: Negative.   Eyes: Negative.   Respiratory: Negative.   Cardiovascular: Negative.   Gastrointestinal: Negative.    Genitourinary: Negative.   Musculoskeletal: Negative.   Skin: Negative.        Pain, erythema, and swelling of LEFT breast  Neurological: Negative.     Allergies  Review of patient's allergies indicates no known allergies.  Home Medications   Current Outpatient Rx  Name Route Sig Dispense Refill  . HYDROCHLOROTHIAZIDE 25 MG PO TABS Oral Take 1 tablet (25 mg total) by mouth daily. 8 tablet 0  . NAPROXEN 500 MG PO TABS Oral Take 1 tablet (500 mg total) by mouth 2 (two) times daily. 30 tablet 0  . OXYCODONE-ACETAMINOPHEN 5-325 MG PO TABS  Take one to two tablets every 4 to 6 hours as needed for pain 20 tablet 0  . SULFAMETHOXAZOLE-TRIMETHOPRIM 800-160 MG PO TABS Oral Take 1 tablet by mouth 2 (two) times daily. 14 tablet 0    BP 128/94  Pulse 94  Temp(Src) 99.2 F (37.3 C) (Oral)  Resp 18  SpO2 100%  LMP 06/13/2011  Physical Exam  Nursing note and vitals reviewed. Constitutional: She is oriented to person, place, and time. She appears well-developed and well-nourished.  HENT:  Head: Normocephalic and atraumatic.  Eyes: EOM are normal.  Neck: Normal range of motion.  Pulmonary/Chest: Effort normal. She exhibits tenderness and swelling. Left breast exhibits skin change and tenderness. Left breast exhibits no inverted nipple,  no mass and no nipple discharge.    Musculoskeletal: Normal range of motion.  Neurological: She is alert and oriented to person, place, and time.  Skin: Skin is warm and dry.  Psychiatric: Her behavior is normal.    ED Course  Procedures (including critical care time)  Labs Reviewed - No data to display No results found.   1. Breast abscess       MDM  rx given for oxycodone and TMP-SMX and referred back to [facility]; return precautions given to return should symptoms worsen over the weekend        Renaee Munda, MD 07/04/11 1825

## 2011-07-09 ENCOUNTER — Encounter: Payer: Self-pay | Admitting: Family Medicine

## 2011-07-09 ENCOUNTER — Ambulatory Visit (INDEPENDENT_AMBULATORY_CARE_PROVIDER_SITE_OTHER): Payer: Medicaid Other | Admitting: Family Medicine

## 2011-07-09 VITALS — BP 124/94 | HR 88 | Temp 98.5°F | Ht 63.0 in | Wt 245.0 lb

## 2011-07-09 DIAGNOSIS — E669 Obesity, unspecified: Secondary | ICD-10-CM

## 2011-07-09 DIAGNOSIS — F17200 Nicotine dependence, unspecified, uncomplicated: Secondary | ICD-10-CM

## 2011-07-09 DIAGNOSIS — I10 Essential (primary) hypertension: Secondary | ICD-10-CM

## 2011-07-09 MED ORDER — HYDROCHLOROTHIAZIDE 25 MG PO TABS: 25.0000 mg | ORAL_TABLET | Freq: Every day | ORAL | Status: DC

## 2011-07-09 NOTE — Patient Instructions (Addendum)
Thank you for coming in today. Try downloading MY[employer]. Think about quitting smoking.  Come see me right before your quit date.  I would like to see yo every 6 months or so.  We would like fasting labs some time this month.

## 2011-07-10 ENCOUNTER — Encounter: Payer: Self-pay | Admitting: Family Medicine

## 2011-07-10 NOTE — Assessment & Plan Note (Signed)
Patient is overweight with an elevated BMI. Discuss her for weight loss strategies. I advised to try My[employer]nessPal.  She'll try that and continued exercise and followup with me with a smoking cessation counseling visit

## 2011-07-10 NOTE — Assessment & Plan Note (Signed)
Blood pressure currently reasonably well controlled.  Patient will be due for fasting cholesterol and comprehensive metabolic panel in the next few weeks. Order placed.

## 2011-07-10 NOTE — Assessment & Plan Note (Signed)
Patient is desirous of quitting smoking. I am highly encouraged by her desire to quit. We can work on her ability to quit. Advised a dedicated smoking cessation followup visit with myself. Discussed starting a quit date and see me a few days before the quit date. She expresses understanding and is encouraged.

## 2011-07-10 NOTE — Progress Notes (Signed)
Claudia Wall is a 36 y.o. female who presents to [facility] today for   1) hypertension. Patient is taking hydrochlorothiazide and feels well. Denies any chest pain palpitations syncope edema orthopnea. She feels well and needs a refill of her hydrochlorothiazide.   2) patient smokes daily. She smokes approximately half a pack of cigarettes a day. She desires to quit smoking. Yet she finds it very difficult. She has never been successful in a quick attempt before. She rates her desire is a 10 and her ability as low.    3) obesity: Like most Americans [name] has struggled to lose weight.  She is not sure what to do.  She is trying to exercise and abstain from high fat high carbohydrate food.     PMH, SH reviewed: Current smoker ROS as above otherwise neg. No Chest pain, palpitations, SOB, Fever, Chills, Abd pain, N/V/D.  Medications reviewed. Current Outpatient Prescriptions  Medication Sig Dispense Refill  . hydrochlorothiazide (HYDRODIURIL) 25 MG tablet Take 1 tablet (25 mg total) by mouth daily.  90 tablet  4    Exam:  BP 124/94  Pulse 88  Temp(Src) 98.5 F (36.9 C) (Oral)  Ht 5\' 3"  (1.6 m)  Wt 245 lb (111.131 kg)  BMI 43.40 kg/m2  LMP 06/02/2011 Gen: Well NAD Lungs: CTABL Nl WOB Heart: RRR no MRG Abd: NABS, NT, ND Exts: Non edematous BL  LE, warm and well perfused.   No results found for this or any previous visit (from the past 72 hour(s)).

## 2011-07-17 ENCOUNTER — Telehealth: Payer: Self-pay | Admitting: *Deleted

## 2011-07-17 NOTE — Telephone Encounter (Signed)
Patient states she went to Urgent care on 04/05 for mastitis. Was given antibiotic. She took 4-5 tabs and stopped. Now  her main concern that she is calling about is vaginal discharge. However her breast still is sore and has not improved any.  Offered appointment today but she cannot come in today. Appointment scheduled tomorrow.

## 2011-07-18 ENCOUNTER — Encounter: Payer: Self-pay | Admitting: Family Medicine

## 2011-07-18 ENCOUNTER — Ambulatory Visit (INDEPENDENT_AMBULATORY_CARE_PROVIDER_SITE_OTHER): Payer: Medicaid Other | Admitting: Family Medicine

## 2011-07-18 VITALS — BP 122/88 | HR 67 | Temp 98.4°F | Ht 67.0 in | Wt 246.0 lb

## 2011-07-18 DIAGNOSIS — N760 Acute vaginitis: Secondary | ICD-10-CM

## 2011-07-18 DIAGNOSIS — N61 Mastitis without abscess: Secondary | ICD-10-CM

## 2011-07-18 DIAGNOSIS — N898 Other specified noninflammatory disorders of vagina: Secondary | ICD-10-CM

## 2011-07-18 LAB — POCT WET PREP (WET MOUNT): Clue Cells Wet Prep Whiff POC: POSITIVE

## 2011-07-18 MED ORDER — METRONIDAZOLE 500 MG PO TABS: 500.0000 mg | ORAL_TABLET | Freq: Two times a day (BID) | ORAL | Status: AC

## 2011-07-18 NOTE — Assessment & Plan Note (Signed)
Healing currently. Recommended she continue full course of antibiotics as it was not drained at urgent care. However it did spontaneously drain on its own. Provided her with red flags and warnings that would indicate return to medical care plate

## 2011-07-18 NOTE — Progress Notes (Signed)
  Subjective:    Patient ID: Claudia Wall, female    DOB: 1975-06-16, 36 y.o.   MRN: [id number]  HPI  #1. Mastitis: Patient lives with surgery for an inflamed/infected memory gland duct about one year ago in the past. She was doing well since then until a week ago when she had red hot swollen gland on the lateral edge of area low. She presented to urgent care and was diagnosed with mastitis and placed on antibiotic at that time. She states she took a few of the antibiotics and then stopped.  She states that it opened and is spontaneously drained a few days ago. Since then the redness, pain has significantly improved. It now only mainly itches.  #2. Vaginal discharge: Again and she was placed on antibiotic. Describes this year, thick white discharge, vaginal itching. No dysuria or burning noted. No abdominal pain. No new sexual contacts. She states she occasionally has vaginal discharge after condom use. She does continue to use condoms with sexual intercourse.  Review of Systems See HPI above for review of systems.       Objective:   Physical Exam  Gen:  Alert, cooperative patient who appears stated age in no acute distress.  Vital signs reviewed. Breast:  Right breast within normal limits. Left breast reveals 1 cm in size healing for abscess with no overlying cellulitis lateral edge of area low. No warmth noted. Nontender. GYN:  External genitalia within normal limits.  Vaginal mucosa pink, moist, normal rugae.  Nonfriable cervix without lesions, mild thick white discharge with no bleeding noted on speculum exam.  Bimanual exam revealed normal, nongravid uterus.  No cervical motion tenderness. No adnexal masses bilaterally.    Full exam performed with female nurse chaperone present for entire exam.     Assessment & Plan:

## 2011-07-18 NOTE — Assessment & Plan Note (Addendum)
Likely yeast vaginitis in this patient with recent antibiotic use and initiation of discharge after beginning abuse. However possibly also bacterial vaginosis as she does describe fishy odor and history of discharge after condom use. Wet prep obtained today.  Revealed BV. Treat with Flagyl.

## 2011-10-01 ENCOUNTER — Telehealth: Payer: Self-pay | Admitting: Family Medicine

## 2011-10-01 NOTE — Telephone Encounter (Signed)
Patient is calling because she has an ingrown toenail and she wants to know how she can treat it over the counter to try and treat it herself.

## 2011-10-01 NOTE — Telephone Encounter (Signed)
Patient states toe is painful, no  redness or swolling. She has tried cutting the area out herself and is unable to. Advised she can soak in epsom salts , she can use peroxide ( she asks about this ). Advised she really needs to come in to have the area removed.  Offered appointment  . Her medicaid is not in effect now so she does not want to make appointment now.

## 2011-10-27 ENCOUNTER — Ambulatory Visit (INDEPENDENT_AMBULATORY_CARE_PROVIDER_SITE_OTHER): Payer: Medicaid Other | Admitting: Family Medicine

## 2011-10-27 ENCOUNTER — Telehealth: Payer: Self-pay | Admitting: Family Medicine

## 2011-10-27 ENCOUNTER — Other Ambulatory Visit: Payer: Self-pay

## 2011-10-27 ENCOUNTER — Ambulatory Visit (HOSPITAL_COMMUNITY)
Admission: RE | Admit: 2011-10-27 | Discharge: 2011-10-27 | Disposition: A | Discharge status: Home / Self Care | Payer: Medicaid Other | Source: Ambulatory Visit | Attending: Family Medicine | Admitting: Family Medicine

## 2011-10-27 ENCOUNTER — Encounter: Payer: Self-pay | Admitting: Family Medicine

## 2011-10-27 VITALS — BP 120/84 | HR 76 | Temp 99.2°F | Ht 63.0 in | Wt 239.4 lb

## 2011-10-27 DIAGNOSIS — I4902 Ventricular flutter: Secondary | ICD-10-CM | POA: Insufficient documentation

## 2011-10-27 DIAGNOSIS — I10 Essential (primary) hypertension: Secondary | ICD-10-CM

## 2011-10-27 DIAGNOSIS — F17200 Nicotine dependence, unspecified, uncomplicated: Secondary | ICD-10-CM

## 2011-10-27 DIAGNOSIS — F1010 Alcohol abuse, uncomplicated: Secondary | ICD-10-CM | POA: Insufficient documentation

## 2011-10-27 DIAGNOSIS — R002 Palpitations: Secondary | ICD-10-CM

## 2011-10-27 MED ORDER — NICOTINE 14 MG/24HR TD PT24: 1.0000 | MEDICATED_PATCH | TRANSDERMAL | Status: AC

## 2011-10-27 MED ORDER — NICOTINE 7 MG/24HR TD PT24: 1.0000 | MEDICATED_PATCH | TRANSDERMAL | Status: DC

## 2011-10-27 NOTE — Assessment & Plan Note (Signed)
A: BP well controlled P: continue current regimen  

## 2011-10-27 NOTE — Assessment & Plan Note (Signed)
A: ready to quit. P: per AVS Nicotine patch: recommend 21 mg start, patient request 14 mg.  Support: help line and smoking cessation classes.

## 2011-10-27 NOTE — Assessment & Plan Note (Signed)
A: patient contemplative about change.  P: continue to encourage a healthier drinking pattern vs abstinence if patient unable to comply.  She my benefit from SSRI for mood elevation but was resistant to this today.

## 2011-10-27 NOTE — Progress Notes (Signed)
Subjective:     Patient ID: Claudia Wall, female   DOB: 01/17/1976, 36 y.o.   MRN: [id number]  HPI 36 yo F with a past medical history significant for anxiety, HTN, tobacco use and obesity presents with a complaint of palpitations. Palpitations occurred three times yesterday, associated with chest heaviness, relieved with time, last seconds. Symptoms occurred at work while at rest. She denies symptoms with exertion, syncope, chest pain, reflux symptoms. Admit to feeling a bit lightheaded during events. No symptoms currently. These symptoms are unlike her previous anxiety which is marked by worry and shortness of breath.   Smoking:  1/2- 1 PPD x 25 years. Denies chronic cough, chest pain or shortness of breath.   Alcohol: drinks 1/5th of liquor and 6 pack of beer weekly. Drinking x 2 years. Cutting back due to cost of alcohol.   Denies illicit drug use.   Review of Systems As per HPI     Objective:   Physical Exam BP 120/84  Pulse 76  Temp 99.2 F (37.3 C) (Oral)  Ht 5\' 3"  (1.6 m)  Wt 239 lb 6.4 oz (108.591 kg)  BMI 42.41 kg/m2  SpO2 99%  LMP 05/30/2011 General appearance: alert, cooperative and no distress Head: Normocephalic, without obvious abnormality, atraumatic Eyes: conjunctivae/corneas clear. PERRL, EOM's intact. Throat: lips, mucosa, and tongue normal; teeth and gums normal Neck: no adenopathy, no carotid bruit, no JVD, supple, symmetrical, trachea midline and thyroid not enlarged, symmetric, no tenderness/mass/nodules Lungs: clear to auscultation bilaterally Heart: regular rate and rhythm, S1, S2 normal, no murmur, click, rub or gallop Extremities: extremities normal, atraumatic, no cyanosis or edema Pulses: 2+ and symmetric EKG: NSR, HR 68, normal axis, normal R wave progression.   Assessment and Plan:

## 2011-10-27 NOTE — Telephone Encounter (Signed)
Patient experiences heart flutter and shortness of breath 3 x yesterday and wants to be seen today.

## 2011-10-27 NOTE — Telephone Encounter (Signed)
Returned call to patient.  Had some episodes yesterday of "heart flutter."  No episodes today.  Thinks it may be due to her smoking and states she is going to stop smoking.  Patient had episode > 1 year ago and has had flutters about once a month.  Patient is concerned because yesterday was more than usual.  Patient requesting appt for today, but has to work at 2:30pm and may not be able to wait to be worked in on [employer].  No appts available and encouraged patient to go to urgent care to be evaluated.  Patient declined and states she will come for SDA appt this afternoon.  Will need work note for her employer.  Informed patient we will give her work note when she comes for appt  Gaylene Brooks, RN

## 2011-10-27 NOTE — Assessment & Plan Note (Signed)
A: intermittent palpitations. Norma exam and EKG today. Suspect anxiety vs PVCs. Contributing factors: alcoholism, smoking, HTN.  P:  -counseled patient on binge drinking: recommend 2 drinks at a time.  -smoking cessation counseling -increase exercise.

## 2011-10-27 NOTE — Patient Instructions (Signed)
Claudia Wall,  Thank you for coming in today. Your exam is normal today. For the palpitations:  1. Please call and come back if they recur especially if you feel lightheaded or develop chest pain. 2. Please increase exercise to 30 minutes 3-5 days per week. 3. Work on continuing to cut down on your drinking.  4. Think about starting medication for anxiety (SRRI)   For smoking:  Smoking cessation support: smoking cessation hotline: 1-800-QUIT-NOW.  Here is the number to the smoking cessation classes at [facility]: 409-8119 Start nicotine patch 14 mg daily x 6 weeks, 7 mg daily x 2 weeks. Set quit date. For 2-6 weeks from now.   F/u with your PCP in 4 weeks.   Dr. Armen Pickup

## 2011-11-22 ENCOUNTER — Emergency Department (HOSPITAL_COMMUNITY)
Admission: EM | Admit: 2011-11-22 | Discharge: 2011-11-22 | Disposition: A | Discharge status: Home / Self Care | Payer: Self-pay | Attending: Emergency Medicine | Admitting: Emergency Medicine

## 2011-11-22 ENCOUNTER — Encounter (HOSPITAL_COMMUNITY): Payer: Self-pay | Admitting: Emergency Medicine

## 2011-11-22 DIAGNOSIS — R5381 Other malaise: Secondary | ICD-10-CM | POA: Insufficient documentation

## 2011-11-22 DIAGNOSIS — R42 Dizziness and giddiness: Secondary | ICD-10-CM | POA: Insufficient documentation

## 2011-11-22 DIAGNOSIS — F17200 Nicotine dependence, unspecified, uncomplicated: Secondary | ICD-10-CM | POA: Insufficient documentation

## 2011-11-22 DIAGNOSIS — I10 Essential (primary) hypertension: Secondary | ICD-10-CM | POA: Insufficient documentation

## 2011-11-22 DIAGNOSIS — E669 Obesity, unspecified: Secondary | ICD-10-CM | POA: Insufficient documentation

## 2011-11-22 LAB — COMPREHENSIVE METABOLIC PANEL
ALT: 26 U/L (ref 0–35)
Alkaline Phosphatase: 74 U/L (ref 39–117)
BUN: 12 mg/dL (ref 6–23)
CO2: 29 mEq/L (ref 19–32)
Chloride: 102 mEq/L (ref 96–112)
GFR calc Af Amer: 88 mL/min — ABNORMAL LOW (ref >90)
Glucose, Bld: 101 mg/dL — ABNORMAL HIGH (ref 70–99)
Potassium: 4.5 mEq/L (ref 3.5–5.1)
Sodium: 137 mEq/L (ref 135–145)
Total Bilirubin: 0.2 mg/dL — ABNORMAL LOW (ref 0.3–1.2)
Total Protein: 7.4 g/dL (ref 6.0–8.3)

## 2011-11-22 LAB — CBC WITH DIFFERENTIAL/PLATELET
HCT: 39.6 % (ref 36.0–46.0)
Hemoglobin: 13.7 g/dL (ref 12.0–15.0)
Lymphs Abs: 3.9 10*3/uL (ref 0.7–4.0)
MCHC: 34.6 g/dL (ref 30.0–36.0)
Monocytes Absolute: 0.5 10*3/uL (ref 0.1–1.0)
Monocytes Relative: 6 % (ref 3–12)
Neutro Abs: 4 10*3/uL (ref 1.7–7.7)
Neutrophils Relative %: 47 % (ref 43–77)
RBC: 4.37 MIL/uL (ref 3.87–5.11)

## 2011-11-22 LAB — URINALYSIS, ROUTINE W REFLEX MICROSCOPIC
Bilirubin Urine: NEGATIVE
Glucose, UA: NEGATIVE mg/dL
Hgb urine dipstick: NEGATIVE
Specific Gravity, Urine: 1.03 (ref 1.005–1.030)
pH: 5.5 (ref 5.0–8.0)

## 2011-11-22 NOTE — ED Notes (Signed)
PA at bedside.

## 2011-11-22 NOTE — ED Notes (Signed)
Pt states she is feeling dizzy and fatigued all day  Pt states she feels very tired and has no energy  Pt states she has not ever felt this way  Pt states she has had a headache for the past 3 days but is resolved now but now she feels like this   Pt states she has a little nausea

## 2011-11-22 NOTE — ED Notes (Signed)
Pt states she thinks she may have taken 2 of her HCTZ yesterday instead of just one by accident

## 2011-11-22 NOTE — ED Provider Notes (Signed)
History     CSN: [id number]  Arrival date & time 11/22/11  0158   First MD Initiated Contact with Patient 11/22/11 0430      Chief Complaint  Patient presents with  . Dizziness  . Fatigue    (Consider location/radiation/quality/duration/timing/severity/associated sxs/prior treatment) HPI Comments: Claudia Wall is a 36 y.o. Female who presents with complaint of weakness and dizziness. States symptoms started yesterday. States feels "drained of energy" and all she wants to do is sleep. States she took two naps during the day. States dizzy when standing up and weak. Denies fever, chills, pain anywhere. No URI symptoms, no nausea, vomiting, abdominal pain, urinary symptoms. No hx of the same.    Past Medical History  Diagnosis Date  . Obesity   . Breast abscess   . HTN (hypertension)   . Irregular periods/menstrual cycles   . Tobacco abuse   . Obesity   . Tobacco abuse     Past Surgical History  Procedure Date  . Incision and drainage breast abscess   . Cholecystectomy 2002    Family History  Problem Relation Age of Onset  . Hypertension Father   . Cancer Mother   . Hypertension Mother     History  Substance Use Topics  . Smoking status: Current Everyday Smoker -- 0.5 packs/day    Types: Cigarettes  . Smokeless tobacco: Not on file   Comment: "smokes 1/2 to 1ppd depending on stress"  . Alcohol Use: 1.8 oz/week    3 Glasses of wine per week     occassional    OB History    Grav Para Term Preterm Abortions TAB SAB Ect Mult Living                  Review of Systems  Constitutional: Negative for fever and chills.  HENT: Negative for neck pain and neck stiffness.   Eyes: Negative for visual disturbance.  Respiratory: Negative.   Cardiovascular: Negative.   Gastrointestinal: Negative.   Genitourinary: Negative for dysuria, urgency, flank pain, vaginal bleeding, vaginal pain and pelvic pain.  Neurological: Positive for dizziness and weakness. Negative for  headaches.    Allergies  Review of patient's allergies indicates no known allergies.  Home Medications   Current Outpatient Rx  Name Route Sig Dispense Refill  . HYDROCHLOROTHIAZIDE 25 MG PO TABS Oral Take 1 tablet (25 mg total) by mouth daily. 90 tablet 4  . NAPROXEN SODIUM 220 MG PO TABS Oral Take 220 mg by mouth 2 (two) times daily with a meal.    . NICOTINE 14 MG/24HR TD PT24 Transdermal Place 1 patch onto the skin daily. 21 patch 1    BP 133/83  Pulse 68  Temp 98.7 F (37.1 C) (Oral)  Resp 18  SpO2 100%  LMP 11/05/2011  Physical Exam  Nursing note and vitals reviewed. Constitutional: She is oriented to person, place, and time. She appears well-developed and well-nourished. No distress.  HENT:  Head: Normocephalic and atraumatic.  Right Ear: External ear normal.  Left Ear: External ear normal.  Nose: Nose normal.  Mouth/Throat: Oropharynx is clear and moist.  Eyes: Conjunctivae are normal.  Neck: Normal range of motion. Neck supple.       No meningismus  Cardiovascular: Normal rate, regular rhythm and normal heart sounds.   Pulmonary/Chest: Effort normal and breath sounds normal. No respiratory distress. She has no wheezes. She has no rales.  Abdominal: Soft. Bowel sounds are normal. She exhibits no distension. There is no  tenderness. There is no rebound.  Musculoskeletal: She exhibits no edema.  Neurological: She is alert and oriented to person, place, and time.  Skin: Skin is warm and dry.  Psychiatric: She has a normal mood and affect.    ED Course  Procedures (including critical care time)  Pt with weakness, dizziness for a day. No specific complaints. VS normal. Will check CBC, electrolytes, UA, urine preg.   Results for orders placed during the hospital encounter of 11/22/11  CBC WITH DIFFERENTIAL      Component Value Range   WBC 8.5  4.0 - 10.5 K/uL   RBC 4.37  3.87 - 5.11 MIL/uL   Hemoglobin 13.7  12.0 - 15.0 g/dL   HCT 81.1  91.4 - 78.2 %   MCV  90.6  78.0 - 100.0 fL   MCH 31.4  26.0 - 34.0 pg   MCHC 34.6  30.0 - 36.0 g/dL   RDW 95.6  21.3 - 08.6 %   Platelets 336  150 - 400 K/uL   Neutrophils Relative 47  43 - 77 %   Neutro Abs 4.0  1.7 - 7.7 K/uL   Lymphocytes Relative 46  12 - 46 %   Lymphs Abs 3.9  0.7 - 4.0 K/uL   Monocytes Relative 6  3 - 12 %   Monocytes Absolute 0.5  0.1 - 1.0 K/uL   Eosinophils Relative 1  0 - 5 %   Eosinophils Absolute 0.1  0.0 - 0.7 K/uL   Basophils Relative 0  0 - 1 %   Basophils Absolute 0.0  0.0 - 0.1 K/uL  URINALYSIS, ROUTINE W REFLEX MICROSCOPIC      Component Value Range   Color, Urine YELLOW  YELLOW   APPearance CLOUDY (*) CLEAR   Specific Gravity, Urine 1.030  1.005 - 1.030   pH 5.5  5.0 - 8.0   Glucose, UA NEGATIVE  NEGATIVE mg/dL   Hgb urine dipstick NEGATIVE  NEGATIVE   Bilirubin Urine NEGATIVE  NEGATIVE   Ketones, ur NEGATIVE  NEGATIVE mg/dL   Protein, ur NEGATIVE  NEGATIVE mg/dL   Urobilinogen, UA 0.2  0.0 - 1.0 mg/dL   Nitrite NEGATIVE  NEGATIVE   Leukocytes, UA NEGATIVE  NEGATIVE  COMPREHENSIVE METABOLIC PANEL      Component Value Range   Sodium 137  135 - 145 mEq/L   Potassium 4.5  3.5 - 5.1 mEq/L   Chloride 102  96 - 112 mEq/L   CO2 29  19 - 32 mEq/L   Glucose, Bld 101 (*) 70 - 99 mg/dL   BUN 12  6 - 23 mg/dL   Creatinine, Ser 5.78  0.50 - 1.10 mg/dL   Calcium 9.6  8.4 - 46.9 mg/dL   Total Protein 7.4  6.0 - 8.3 g/dL   Albumin 3.9  3.5 - 5.2 g/dL   AST 22  0 - 37 U/L   ALT 26  0 - 35 U/L   Alkaline Phosphatase 74  39 - 117 U/L   Total Bilirubin 0.2 (*) 0.3 - 1.2 mg/dL   GFR calc non Af Amer 76 (*) >90 mL/min   GFR calc Af Amer 88 (*) >90 mL/min    Labs unremarkable. Pt is mildly orthostatic. Discussed lab results with pt. Instructed to drink plenty of fluids. Follow up with primary care doctor in the office.    1. Dizziness       MDM          Lottie Mussel,  PA 11/22/11 [phone]

## 2011-11-27 NOTE — ED Provider Notes (Signed)
Medical screening examination/treatment/procedure(s) were performed by non-physician practitioner and as supervising physician I was immediately available for consultation/collaboration.   Andrez Lieurance M Randeep Biondolillo, DO 11/27/11 1723 

## 2012-01-19 ENCOUNTER — Telehealth: Payer: Self-pay | Admitting: Family Medicine

## 2012-01-19 NOTE — Telephone Encounter (Signed)
Patient has been having neck pain for 2-3 weeks off and on.  She first thought it was a "crick " but now continuing  bothering her .Marland Kitchen Appointment rescheduled for tomorrow for work in. Appointment with PCP for this same problem cancelled.

## 2012-01-19 NOTE — Telephone Encounter (Signed)
Pt is a pain in neck and would like to talk to nurse about this.  She has appt on Wed AM w/pcp

## 2012-01-20 ENCOUNTER — Ambulatory Visit (INDEPENDENT_AMBULATORY_CARE_PROVIDER_SITE_OTHER): Payer: Self-pay | Admitting: Family Medicine

## 2012-01-20 ENCOUNTER — Encounter: Payer: Self-pay | Admitting: Family Medicine

## 2012-01-20 DIAGNOSIS — M542 Cervicalgia: Secondary | ICD-10-CM

## 2012-01-20 MED ORDER — CYCLOBENZAPRINE HCL 10 MG PO TABS: 10.0000 mg | ORAL_TABLET | Freq: Three times a day (TID) | ORAL | Status: DC | PRN

## 2012-01-20 NOTE — Patient Instructions (Signed)
I have sent in a prescription for Flexeril, a muscle relaxer. You can take this with pain in her neck. At least initially, I would recommend only taking it in the evenings as it can make you somewhat sleepy. I would also recommend that you take Aleve twice a day for the next 2 weeks regardless of whether or not her neck is hurting. This will help to reduce inflammation. I would also recommend trying to replace your pillow as I expect that how you are sleeping is making your neck pain worse.

## 2012-01-20 NOTE — Progress Notes (Signed)
Patient ID: Claudia Wall, female   DOB: 12-Nov-1975, 36 y.o.   MRN: [id number] Subjective: The patient is a 36 y.o. year old female who presents today for neck pain x 1 month.  Pain started on left side and of neck, then went to full neck for 2 weeks.  Has now returned to primarily on the left.  Worse with flexion/extension.  Has some muscle tension.  No radiation to arms.  Not obviously worse at any time of day.  No inciting event or trauma.  Patient's past medical, social, and family history were reviewed and updated as appropriate. History  Substance Use Topics  . Smoking status: Former Smoker -- 0.5 packs/day    Types: Cigarettes    Quit date: 11/30/2011  . Smokeless tobacco: Never Used   Comment: "used to smoke 1/2 to 1ppd depending on stress"  . Alcohol Use: 1.8 oz/week    3 Glasses of wine per week     occassional   Objective:  There were no vitals filed for this visit. Wt 248, temp 98.4, height 5'3" Gen: NAD HEENT: No bony abnormalities, no obvious muscle spasm.  Normal flexion/extension.  Patient has normal lateral flexion bilaterally.  Lateral rotation to the right is normal, decreased secondary to pain to the left.  Assessment/Plan: Neck pain, likely MSK in origin.  Muscle relaxer, scheduled NSAID, sleep changes.  See pt instructions.  No evidence of nerve impingement, no need for x-ray due to relatively normal exam and lack of trauma.  Please also see individual problems in problem list for problem-specific plans.

## 2012-01-21 ENCOUNTER — Ambulatory Visit: Payer: Medicaid Other | Admitting: Family Medicine

## 2012-05-11 ENCOUNTER — Telehealth: Payer: Self-pay | Admitting: Sports Medicine

## 2012-05-11 NOTE — Telephone Encounter (Signed)
After hours emergency line call.  Patient reporting her left breast, which has previously had surgery on it before and has issues with recurrent mastitis, is causing her pain over the past 2 days.  She reports that this will usually occur an area around her area low will spontaneously drain and her pain will improve. It has not done so at this time.  She has had a low-grade fever and some mild chills over the past 2 days but no nausea, no vomiting, no orthostasis or other systemic symptoms.  I did encourage her to be evaluated in the [facility] clinic first thing in the morning for evaluation for potential need for I&D versus referral back to surgery.  Instructed she may take Tylenol for pain as well as use warm compresses.  Discussed reasons to be evaluated in the emergency department tonight including unrelenting pain with vomiting.  Patient expresses understanding and agrees to call family practice Center first in the morning to be evaluated.  Understands that both urgent care and the emergency department are options at this time if need be.  Andrena Mews, DO [facility] Resident - PGY-2 05/11/2012 6:50 PM

## 2012-05-12 ENCOUNTER — Telehealth: Payer: Self-pay | Admitting: Family Medicine

## 2012-05-12 ENCOUNTER — Ambulatory Visit
Admission: RE | Admit: 2012-05-12 | Discharge: 2012-05-12 | Disposition: A | Discharge status: Home / Self Care | Payer: No Typology Code available for payment source | Source: Ambulatory Visit | Attending: Family Medicine | Admitting: Family Medicine

## 2012-05-12 ENCOUNTER — Other Ambulatory Visit: Payer: Self-pay | Admitting: Family Medicine

## 2012-05-12 ENCOUNTER — Ambulatory Visit (INDEPENDENT_AMBULATORY_CARE_PROVIDER_SITE_OTHER): Payer: Self-pay | Admitting: Family Medicine

## 2012-05-12 VITALS — BP 142/86 | HR 116 | Temp 99.0°F | Ht 63.0 in | Wt 249.0 lb

## 2012-05-12 DIAGNOSIS — N644 Mastodynia: Secondary | ICD-10-CM | POA: Insufficient documentation

## 2012-05-12 DIAGNOSIS — N6320 Unspecified lump in the left breast, unspecified quadrant: Secondary | ICD-10-CM

## 2012-05-12 DIAGNOSIS — N63 Unspecified lump in unspecified breast: Secondary | ICD-10-CM

## 2012-05-12 MED ORDER — DICLOXACILLIN SODIUM 500 MG PO CAPS: 500.0000 mg | ORAL_CAPSULE | Freq: Four times a day (QID) | ORAL | Status: DC

## 2012-05-12 MED ORDER — HYDROCODONE-ACETAMINOPHEN 10-325 MG PO TABS: 1.0000 | ORAL_TABLET | Freq: Four times a day (QID) | ORAL | Status: DC | PRN

## 2012-05-12 NOTE — Assessment & Plan Note (Signed)
Given history of recurrent breast abscess and her exam I am concerned that there may a breast abscess present today. I will send her for an ultrasound.  Called surgical office but she has an outstanding bill and can not be seen without paying up front.  If it turns out she does have an abscess she may need to go to the ER for the on call surgeon to evaluate.  Will go ahead and start dicloxacillin and norco for pain control .

## 2012-05-12 NOTE — Progress Notes (Signed)
  Subjective:    Patient ID: Claudia Wall, female    DOB: [birth year], 37 y.o.   MRN: [id number]  HPI  1. Breast pain: C/o L breast pain x3 days. 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 Ezzard Standing.  She denies a personal or family history of breast cancer.  She does endorse overall malaise.  Her tmax at home has been 99.  Using ibuprofen for pain control but doesn't work very well. She has not noticed any drainage from the area.  This problem had went away after she quit smoking but she recently started smoking again.    Review of Systems Per HPI    Objective:   Physical Exam  Constitutional: She appears well-nourished. No distress.  Neurological: She is alert.  Skin:  L breast with erythema extending from areola.  She does have indurated area around the nipple and areola.  Difficult to assess for fluctuance due to pain.  She also has a tender axillary lymph node.           Assessment & Plan:

## 2012-05-12 NOTE — Telephone Encounter (Signed)
Called patient on behalf or Dr. Ashley Royalty, [name] negative for breast abscess.  She should take the antibiotics as prescribed.  She voices understanding.

## 2012-05-12 NOTE — Patient Instructions (Signed)
Thank you for coming in today, it was good to see you I am concerned you may have another abscess in your breast We are scheduling you for an ultrasound. I have sent in antibiotics for this and you can use the pain medication as needed.

## 2012-06-14 IMAGING — CR DG CHEST 2V
2 series · 2 of 2 positions shown · non-contrast
Comparison: 11/09/2008

CLINICAL DATA: Preoperative respiratory exam.  Left breast abscess.

CHEST - 2 VIEW

[w chest pa]
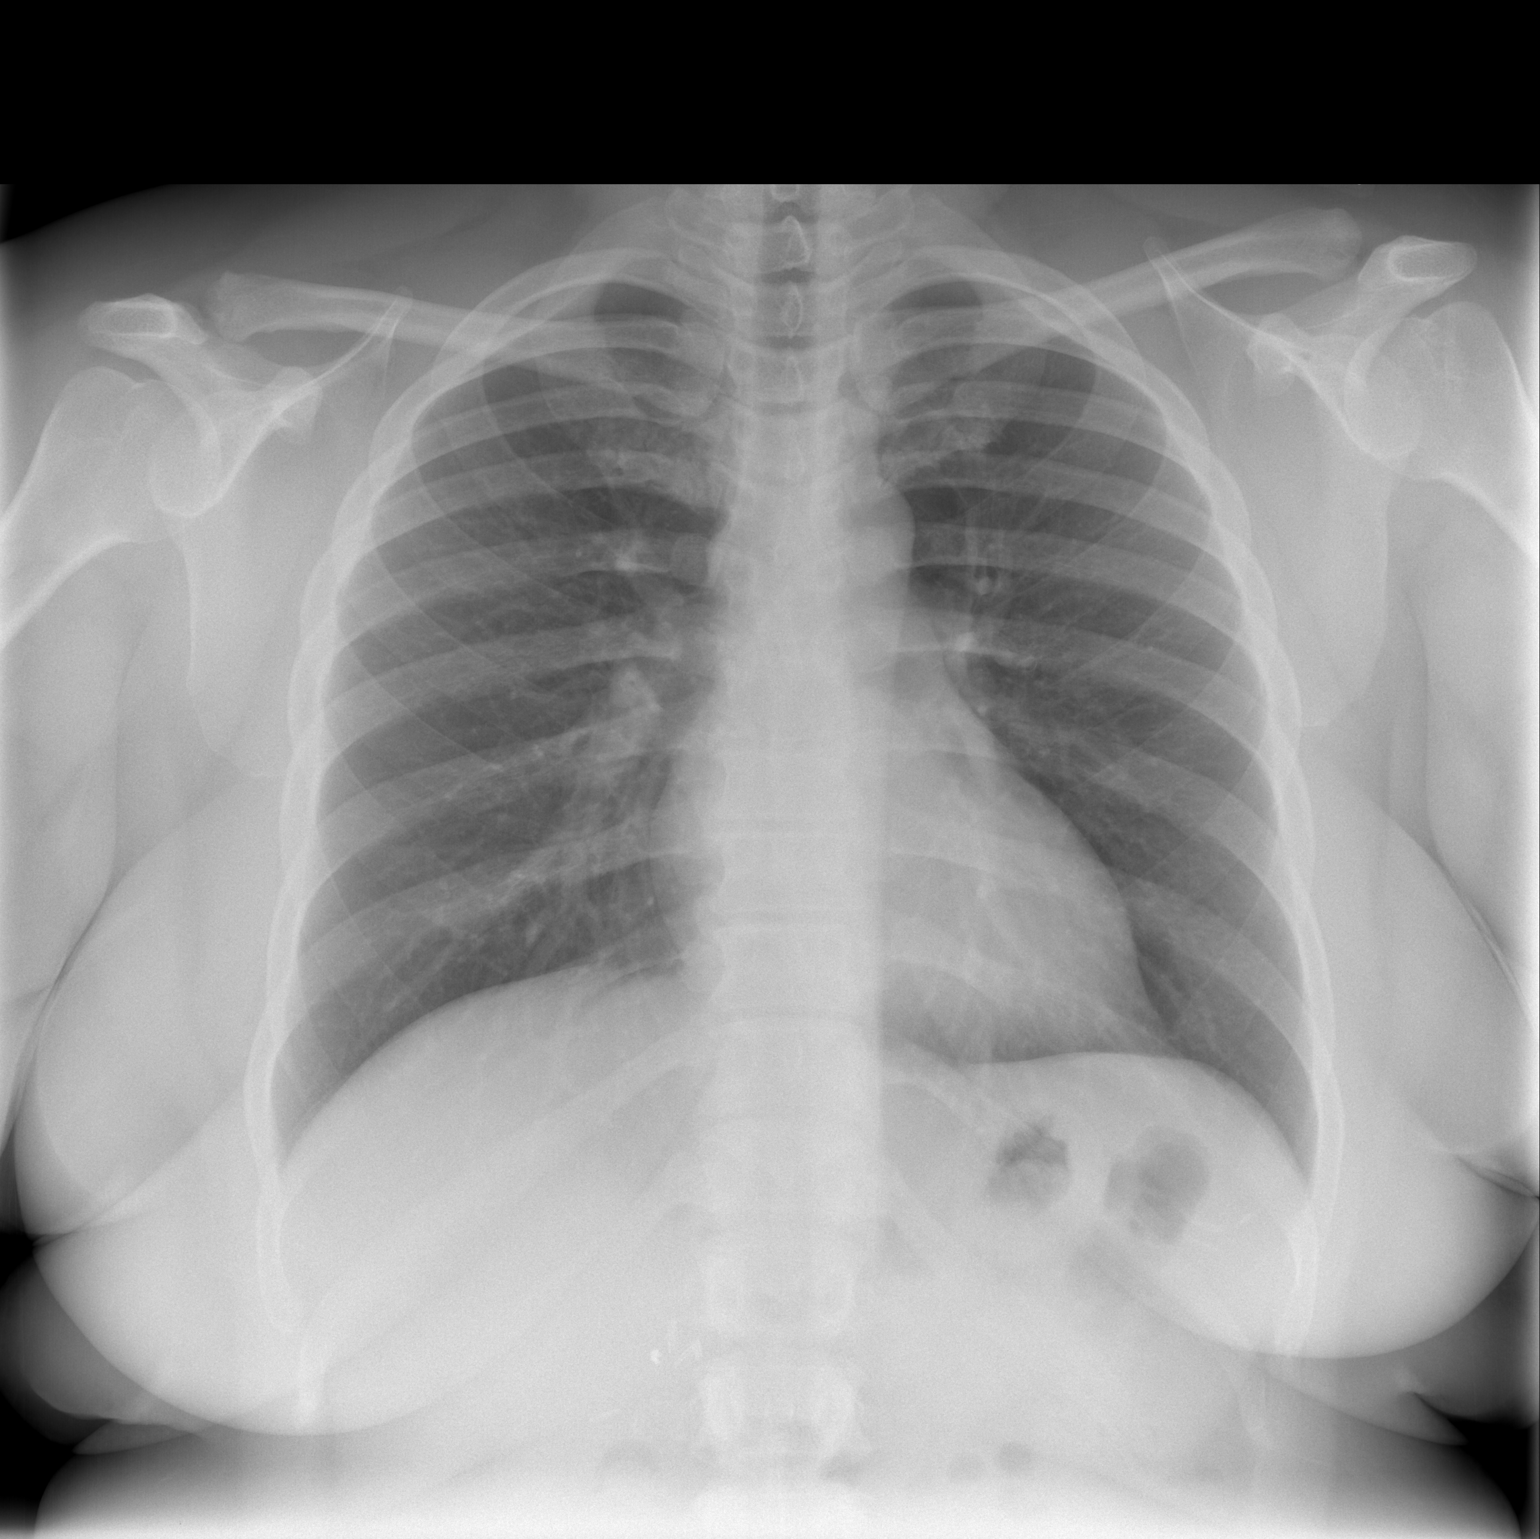

[w chest lat]
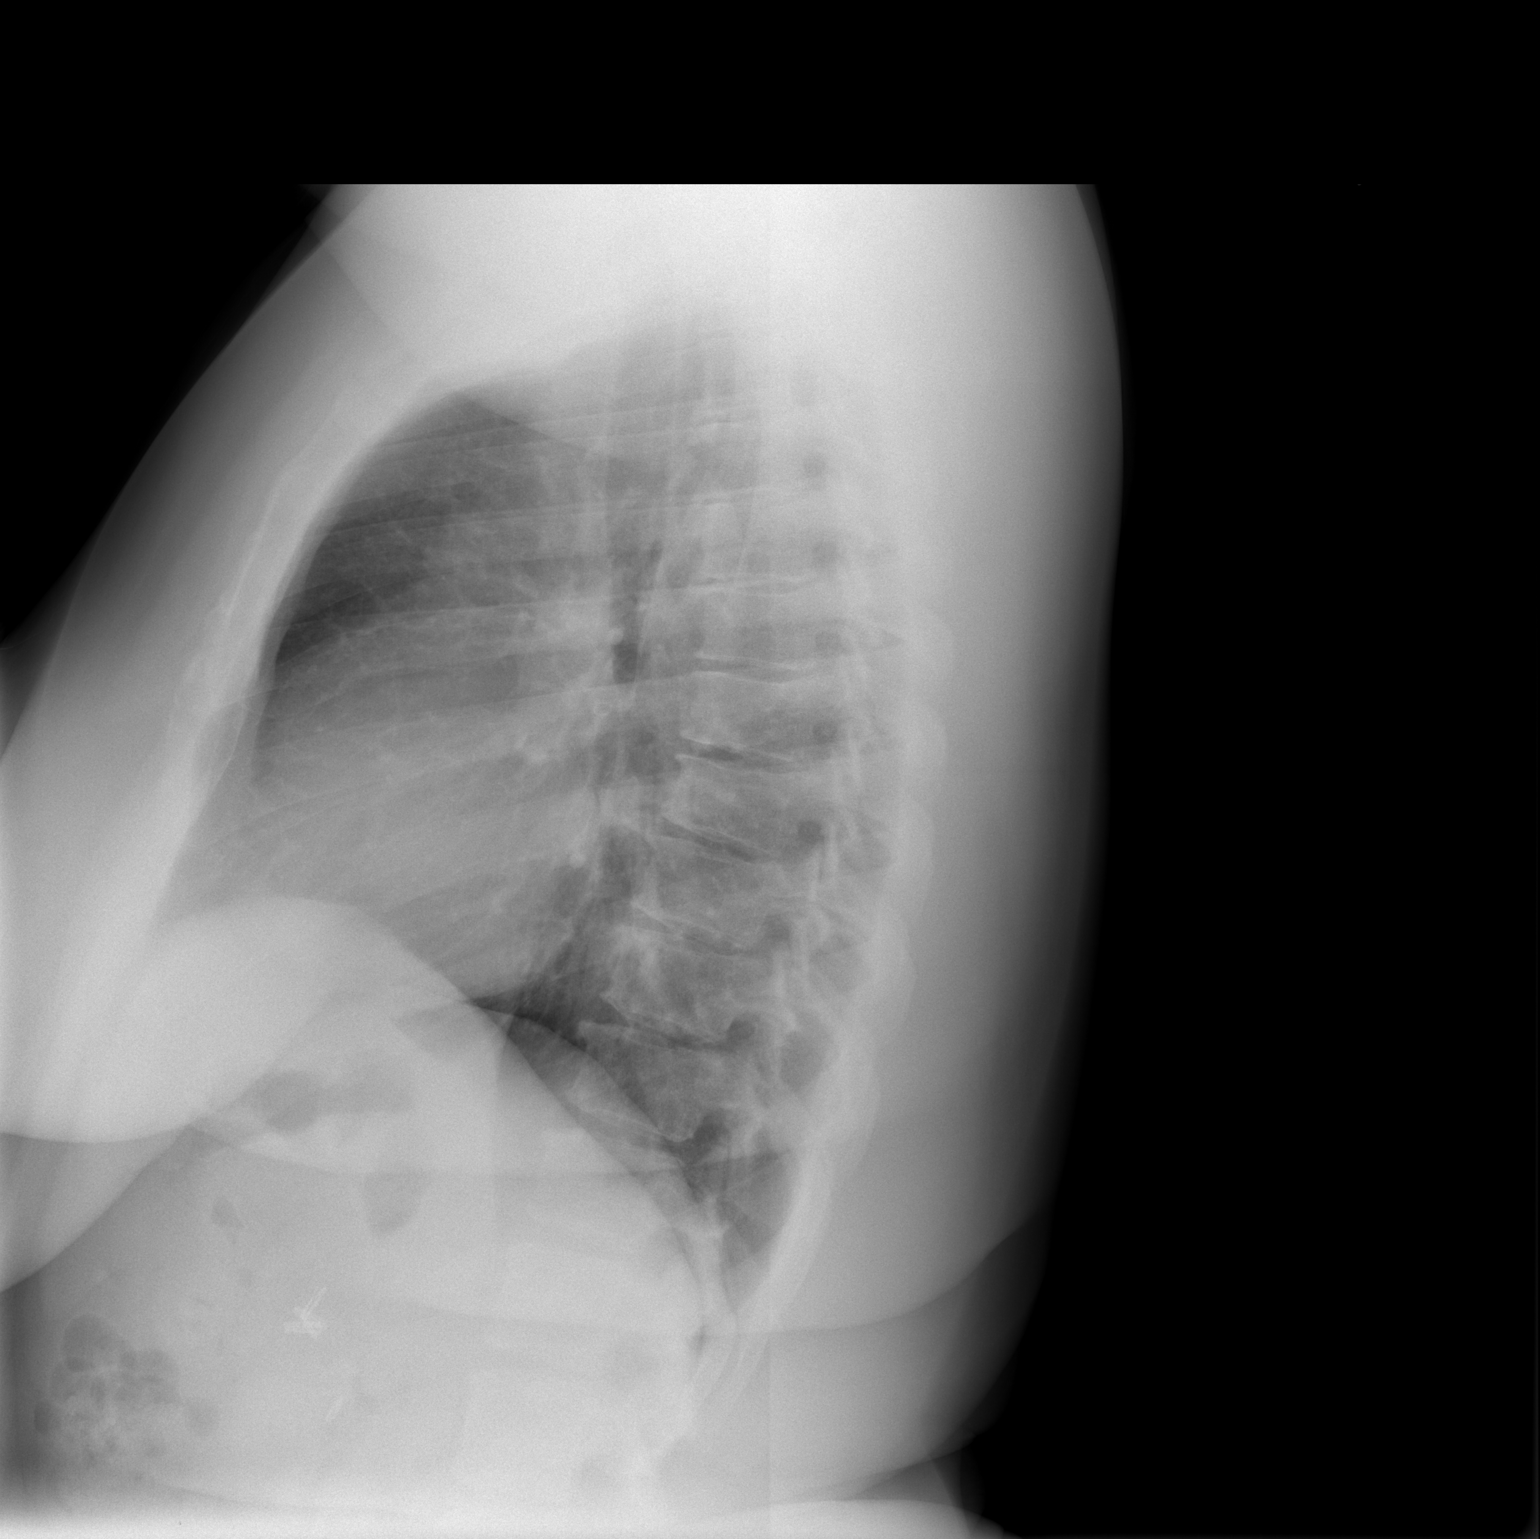

[2 of 2 positions shown; findings below may reference images not displayed]

FINDINGS: The heart size and vascularity are normal and the lungs
are clear.  No osseous abnormality.
IMPRESSION: Normal chest.

## 2012-08-02 ENCOUNTER — Other Ambulatory Visit: Payer: Self-pay | Admitting: *Deleted

## 2012-08-02 DIAGNOSIS — I10 Essential (primary) hypertension: Secondary | ICD-10-CM

## 2012-08-02 MED ORDER — HYDROCHLOROTHIAZIDE 25 MG PO TABS: 25.0000 mg | ORAL_TABLET | Freq: Every day | ORAL | Status: DC

## 2012-08-03 ENCOUNTER — Encounter: Payer: Self-pay | Admitting: Family Medicine

## 2012-08-05 NOTE — Telephone Encounter (Signed)
Error

## 2012-09-02 ENCOUNTER — Encounter (HOSPITAL_COMMUNITY): Payer: Self-pay | Admitting: Family Medicine

## 2012-09-02 ENCOUNTER — Emergency Department (HOSPITAL_COMMUNITY)
Admission: EM | Admit: 2012-09-02 | Discharge: 2012-09-03 | Disposition: A | Discharge status: Home / Self Care | Payer: Self-pay | Attending: Emergency Medicine | Admitting: Emergency Medicine

## 2012-09-02 DIAGNOSIS — Z8742 Personal history of other diseases of the female genital tract: Secondary | ICD-10-CM | POA: Insufficient documentation

## 2012-09-02 DIAGNOSIS — Z87448 Personal history of other diseases of urinary system: Secondary | ICD-10-CM | POA: Insufficient documentation

## 2012-09-02 DIAGNOSIS — I10 Essential (primary) hypertension: Secondary | ICD-10-CM | POA: Insufficient documentation

## 2012-09-02 DIAGNOSIS — Z87891 Personal history of nicotine dependence: Secondary | ICD-10-CM | POA: Insufficient documentation

## 2012-09-02 DIAGNOSIS — R0789 Other chest pain: Secondary | ICD-10-CM | POA: Insufficient documentation

## 2012-09-02 DIAGNOSIS — Z791 Long term (current) use of non-steroidal anti-inflammatories (NSAID): Secondary | ICD-10-CM | POA: Insufficient documentation

## 2012-09-02 DIAGNOSIS — E669 Obesity, unspecified: Secondary | ICD-10-CM | POA: Insufficient documentation

## 2012-09-02 DIAGNOSIS — Z872 Personal history of diseases of the skin and subcutaneous tissue: Secondary | ICD-10-CM | POA: Insufficient documentation

## 2012-09-02 HISTORY — DX: Mastitis without abscess: N61.0

## 2012-09-02 NOTE — ED Provider Notes (Signed)
History     CSN: [id number]  Arrival date & time 09/02/12  2308   First MD Initiated Contact with Patient 09/02/12 2336      Chief Complaint  Patient presents with  . Chest Pain   HPI the past medical history of hypertension and occasional indigestion presents with chest pain. She says it started this morning, it is brief lasting less than 5 seconds, feels like a pressure in the center of her chest, does not radiate, is not associated with diaphoresis, nausea, vomiting, abdominal pain, shortness of breath. No history of coronary artery disease. Patient has no family history of coronary artery disease. Patient has no history of venous thromboembolic disease. No hemoptysis, no recent long travel, no exogenous estrogens, no immobilization or trauma recently. Patient has not had a cough, denies any fevers or chills.  She thought it was "gas" he took some Rolaids which initially helped but the pain recurred this evening twice, again it was a similar type of pain, pressure, less than 5 seconds and self resolved. Patient also has a history of anxiety and panic but she says this feels different.  Past Medical History  Diagnosis Date  . Obesity   . Breast abscess   . HTN (hypertension)   . Irregular periods/menstrual cycles   . Tobacco abuse   . Obesity   . Tobacco abuse   . Mastitis     Past Surgical History  Procedure Laterality Date  . Incision and drainage breast abscess    . Cholecystectomy  2002    Family History  Problem Relation Age of Onset  . Hypertension Father   . Cancer Mother   . Hypertension Mother     History  Substance Use Topics  . Smoking status: Former Smoker -- 0.50 packs/day    Types: Cigarettes    Quit date: 11/30/2011  . Smokeless tobacco: Never Used     Comment: "used to smoke 1/2 to 1ppd depending on stress"  . Alcohol Use: 1.8 oz/week    3 Glasses of wine per week     Comment: occassional    OB History   Grav Para Term Preterm Abortions TAB SAB  Ect Mult Living                  Review of Systems At least 10pt or greater review of systems completed and are negative except where specified in the HPI.  Allergies  Review of patient's allergies indicates no known allergies.  Home Medications   Current Outpatient Rx  Name  Route  Sig  Dispense  Refill  . hydrochlorothiazide (HYDRODIURIL) 25 MG tablet   Oral   Take 1 tablet (25 mg total) by mouth daily.   30 tablet   0   . naproxen sodium (ALEVE) 220 MG tablet   Oral   Take 220 mg by mouth 2 (two) times daily with a meal.           BP 132/82  Pulse 66  Temp(Src) 99.4 F (37.4 C) (Oral)  Resp 18  SpO2 100%  LMP 06/08/2012  Physical Exam  Nursing notes reviewed.  Electronic medical record reviewed. VITAL SIGNS:   Filed Vitals:   09/02/12 2316  BP: 132/82  Pulse: 66  Temp: 99.4 F (37.4 C)  TempSrc: Oral  Resp: 18  SpO2: 100%   CONSTITUTIONAL: Awake, oriented, appears non-toxic HENT: Atraumatic, normocephalic, oral mucosa pink and moist, airway patent. Nares patent without drainage. External ears normal. EYES: Conjunctiva clear, EOMI, PERRLA  NECK: Trachea midline, non-tender, supple CARDIOVASCULAR: Normal heart rate, Normal rhythm, No murmurs, rubs, gallops PULMONARY/CHEST: Clear to auscultation, no rhonchi, wheezes, or rales. Symmetrical breath sounds. Non-tender. ABDOMINAL: Non-distended, obese, soft, non-tender - no rebound or guarding.  BS normal. NEUROLOGIC: Non-focal, moving all four extremities, no gross sensory or motor deficits. EXTREMITIES: No clubbing, cyanosis, or edema SKIN: Warm, Dry, No erythema, No rash  ED Course  Procedures (including critical care time)  Date: 09/03/2012  Rate: 81  Rhythm: normal sinus rhythm  QRS Axis: normal  Intervals: normal  ST/T Wave abnormalities: normal  Conduction Disutrbances: none  Narrative Interpretation: unremarkable normal sinus rhythm, no morphological change to suggest acute ST elevation  myocardial infarction for cardiac ischemia, no arrhythmia, no delta wave, no bruit   Labs Reviewed - No data to display Dg Chest Port 1 View  09/03/2012   *RADIOLOGY REPORT*  Clinical Data: Midsternal chest pain  PORTABLE CHEST - 1 VIEW  Comparison: 08/28/2010  Findings: Cardiomediastinal silhouette is within normal limits. The lungs are clear. No pleural effusion.  No pneumothorax.  No acute osseous abnormality.  IMPRESSION: Normal chest.   Original Report Authenticated By: Christiana Pellant, M.D.     1. Atypical chest pain       MDM  ADREONA BRAND is a 37 y.o. female presenting with chest pain that was intermittent, atypical of acute coronary syndrome, patient has an unremarkable EKG. Negative troponin and this is been going on for at least a day. I doubt ACS in this patient. Patient is PERC negative, no pneumonia seen on chest x-ray, no pneumothorax. Patient is nontoxic afebrile. Take her atypical chest pain could be chest wall pain versus GERD or esophageal spasm. No intrathoracic emergency at this time. Patient to followup with primary care physician Dr. Ashley Royalty. Return to the ER for worsening symptoms.         Jones Skene, MD 09/03/12 1610

## 2012-09-02 NOTE — ED Notes (Signed)
Patient states that she has been having chest pain since this morning. States pain has been coming and going all day.

## 2012-09-03 ENCOUNTER — Emergency Department (HOSPITAL_COMMUNITY): Payer: Self-pay

## 2012-09-03 LAB — POCT I-STAT, CHEM 8
Calcium, Ion: 1.23 mmol/L (ref 1.12–1.23)
Creatinine, Ser: 1 mg/dL (ref 0.50–1.10)
Glucose, Bld: 88 mg/dL (ref 70–99)
Hemoglobin: 13.9 g/dL (ref 12.0–15.0)
Potassium: 5.1 mEq/L (ref 3.5–5.1)

## 2012-09-03 MED ORDER — GI COCKTAIL ~~LOC~~
30.0000 mL | Freq: Once | ORAL | Status: AC
  Administered 2012-09-03: 30 mL via ORAL
  Filled 2012-09-03: qty 30

## 2012-09-03 MED ORDER — PANTOPRAZOLE SODIUM 40 MG PO TBEC
40.0000 mg | DELAYED_RELEASE_TABLET | Freq: Once | ORAL | Status: AC
  Administered 2012-09-03: 40 mg via ORAL
  Filled 2012-09-03: qty 1

## 2012-10-18 ENCOUNTER — Ambulatory Visit: Payer: Self-pay | Admitting: Family Medicine

## 2012-10-19 ENCOUNTER — Telehealth: Payer: Self-pay | Admitting: Family Medicine

## 2012-10-19 ENCOUNTER — Other Ambulatory Visit: Payer: Self-pay | Admitting: Family Medicine

## 2012-10-19 DIAGNOSIS — I10 Essential (primary) hypertension: Secondary | ICD-10-CM

## 2012-10-19 MED ORDER — HYDROCHLOROTHIAZIDE 25 MG PO TABS: 25.0000 mg | ORAL_TABLET | Freq: Every day | ORAL | Status: DC

## 2012-10-19 NOTE — Telephone Encounter (Signed)
Sent Rx for HCTZ 25mg  daily to [facility] on [address].   Marena Chancy, PGY-3 Family Medicine Resident

## 2012-10-19 NOTE — Telephone Encounter (Signed)
Will forward to Dr Losq 

## 2012-10-19 NOTE — Telephone Encounter (Signed)
Pt is requesting  Refill on her BP medication. She is out and will be in on 8/18 with Dr. Gwenlyn Saran. I talked to Molly Maduro about this a few minutes ago.. Her pharmacy is [facility] on [address]. JW

## 2012-10-22 ENCOUNTER — Ambulatory Visit: Payer: Self-pay | Admitting: Family Medicine

## 2012-11-15 ENCOUNTER — Encounter: Payer: Self-pay | Admitting: Family Medicine

## 2012-11-15 ENCOUNTER — Ambulatory Visit (INDEPENDENT_AMBULATORY_CARE_PROVIDER_SITE_OTHER): Payer: Self-pay | Admitting: Family Medicine

## 2012-11-15 VITALS — BP 118/65 | HR 88 | Temp 98.1°F | Ht 63.0 in | Wt 242.0 lb

## 2012-11-15 DIAGNOSIS — I10 Essential (primary) hypertension: Secondary | ICD-10-CM

## 2012-11-15 DIAGNOSIS — F17210 Nicotine dependence, cigarettes, uncomplicated: Secondary | ICD-10-CM

## 2012-11-15 DIAGNOSIS — F1010 Alcohol abuse, uncomplicated: Secondary | ICD-10-CM

## 2012-11-15 DIAGNOSIS — F17200 Nicotine dependence, unspecified, uncomplicated: Secondary | ICD-10-CM

## 2012-11-15 MED ORDER — HYDROCHLOROTHIAZIDE 25 MG PO TABS: 25.0000 mg | ORAL_TABLET | Freq: Every day | ORAL | Status: DC

## 2012-11-15 MED ORDER — NICOTINE 14 MG/24HR TD PT24: 1.0000 | MEDICATED_PATCH | TRANSDERMAL | Status: DC

## 2012-11-15 NOTE — Patient Instructions (Addendum)
I will see you back in 2 months.

## 2012-11-15 NOTE — Assessment & Plan Note (Signed)
Patient wanting to try quitting again. She has had good success with quitting on the patches in the past.  Rx for nicotine patch.

## 2012-11-15 NOTE — Progress Notes (Signed)
Patient ID: Claudia Wall    DOB: [birth year], 37 y.o.   MRN: [id number] --- Subjective:  Claudia Wall is a 37 y.o.female who presents for follow up on blood pressure.  - hypertension: not taking hctz 25mg  daily for multiple days. Doesn't check BP outside of clinic. No chest pain, no shortness of breath, no lower extremity swelling.  - Diet and exercise: has had increased stress recently and has stopped eating like she used to. She has lost weight due to that. She generally drinks water. No sweet drinks. She does binge drink alcohol once to twice a week: 6 pack of beer or 1/5 liquor. She doesn't feel guilty about it. Nobody in her family has expressed worry that she is drinking too much. She has done this for many years. She otherwise doesn't exercise on a regular basis.   - Smoking: started smoking back again 1 month ago due to stress in her relationship which has recently ended. She had quit smoking successfully with the patch in the past. She would like to quit again.    ROS: see HPI Past Medical History: reviewed and updated medications and allergies. Social History: Tobacco: see above.   Objective: Filed Vitals:   11/15/12 0903  BP: 118/65  Pulse: 88  Temp: 98.1 F (36.7 C)    Physical Examination:   General appearance - alert, well appearing, and in no distress Chest - clear to auscultation, no wheezes, rales or rhonchi, symmetric air entry Heart - normal rate, regular rhythm, normal S1, S2, no murmurs, rubs, clicks or gallops Extremities - no pedal edema.   BMET    Component Value Date/Time   NA 137 09/03/2012 0100   K 5.1 09/03/2012 0100   CL 104 09/03/2012 0100   CO2 29 11/22/2011 0510   GLUCOSE 88 09/03/2012 0100   BUN 23 09/03/2012 0100   CREATININE 1.00 09/03/2012 0100   CREATININE 1.12 06/12/2010 0919   CALCIUM 9.6 11/22/2011 0510   GFRNONAA 76* 11/22/2011 0510   GFRAA 88* 11/22/2011 0510

## 2012-11-15 NOTE — Assessment & Plan Note (Signed)
Well controled at 118/65 off of hydrochlorothiazide for several days. Will continue to hold on medicine for now. She has lost some weight due to stress at home and weight loss may be contributing to better BP control.  Follow up in 1 month for BP check.

## 2012-11-15 NOTE — Assessment & Plan Note (Signed)
Patient not wanting to quit and not seeing a problem with binge drinking. Explained the effect alcohol can have on increased weight gain as well as long term consequences on liver and other organs. Patient did not seem ready to quit at this time. Will readdress this at subsequent visits.

## 2012-11-23 ENCOUNTER — Ambulatory Visit: Payer: Self-pay | Admitting: Family Medicine

## 2013-03-30 ENCOUNTER — Encounter (HOSPITAL_COMMUNITY): Payer: Self-pay | Admitting: Emergency Medicine

## 2013-03-30 ENCOUNTER — Emergency Department (HOSPITAL_COMMUNITY)
Admission: EM | Admit: 2013-03-30 | Discharge: 2013-03-30 | Disposition: A | Discharge status: Home / Self Care | Payer: Self-pay | Attending: Emergency Medicine | Admitting: Emergency Medicine

## 2013-03-30 DIAGNOSIS — R51 Headache: Secondary | ICD-10-CM | POA: Insufficient documentation

## 2013-03-30 DIAGNOSIS — R197 Diarrhea, unspecified: Secondary | ICD-10-CM | POA: Insufficient documentation

## 2013-03-30 DIAGNOSIS — B9789 Other viral agents as the cause of diseases classified elsewhere: Secondary | ICD-10-CM | POA: Insufficient documentation

## 2013-03-30 DIAGNOSIS — R52 Pain, unspecified: Secondary | ICD-10-CM | POA: Insufficient documentation

## 2013-03-30 DIAGNOSIS — B349 Viral infection, unspecified: Secondary | ICD-10-CM

## 2013-03-30 DIAGNOSIS — Z8742 Personal history of other diseases of the female genital tract: Secondary | ICD-10-CM | POA: Insufficient documentation

## 2013-03-30 DIAGNOSIS — R42 Dizziness and giddiness: Secondary | ICD-10-CM | POA: Insufficient documentation

## 2013-03-30 DIAGNOSIS — I10 Essential (primary) hypertension: Secondary | ICD-10-CM | POA: Insufficient documentation

## 2013-03-30 DIAGNOSIS — R5381 Other malaise: Secondary | ICD-10-CM | POA: Insufficient documentation

## 2013-03-30 DIAGNOSIS — Z87891 Personal history of nicotine dependence: Secondary | ICD-10-CM | POA: Insufficient documentation

## 2013-03-30 DIAGNOSIS — E669 Obesity, unspecified: Secondary | ICD-10-CM | POA: Insufficient documentation

## 2013-03-30 DIAGNOSIS — Z79899 Other long term (current) drug therapy: Secondary | ICD-10-CM | POA: Insufficient documentation

## 2013-03-30 MED ORDER — KETOROLAC TROMETHAMINE 30 MG/ML IJ SOLN
30.0000 mg | Freq: Once | INTRAMUSCULAR | Status: AC
  Administered 2013-03-30: 30 mg via INTRAMUSCULAR
  Filled 2013-03-30: qty 1

## 2013-03-30 MED ORDER — PROMETHAZINE HCL 25 MG PO TABS: 25.0000 mg | ORAL_TABLET | Freq: Four times a day (QID) | ORAL | Status: DC | PRN

## 2013-03-30 MED ORDER — ACETAMINOPHEN 325 MG PO TABS
650.0000 mg | ORAL_TABLET | Freq: Four times a day (QID) | ORAL | Status: DC | PRN
  Administered 2013-03-30: 650 mg via ORAL
  Filled 2013-03-30: qty 2

## 2013-03-30 MED ORDER — ONDANSETRON 4 MG PO TBDP
4.0000 mg | ORAL_TABLET | Freq: Once | ORAL | Status: AC
  Administered 2013-03-30: 4 mg via ORAL
  Filled 2013-03-30: qty 1

## 2013-03-30 NOTE — ED Notes (Signed)
Pt c/o bodyaches, fever, cough, n/v/d x 3 days. Pt states yesterday is when she started vomiting and having diarrhea.

## 2013-03-30 NOTE — ED Notes (Signed)
Pt tolerated fluid challenge well. 

## 2013-03-30 NOTE — ED Provider Notes (Signed)
CSN: [id number]     Arrival date & time 03/30/13  1833 History   First MD Initiated Contact with Patient 03/30/13 2127     Chief Complaint  Patient presents with  . Generalized Body Aches  . Fever  . Dizziness  . Headache  . Vomiting   (Consider location/radiation/quality/duration/timing/severity/associated sxs/prior Treatment) HPI Comments: Patient presents today with a chief complaint of fatigue, headache, sore throat, cough, fever, chills, and body aches.  Symptoms began three days ago and are gradually worsening.  She reports that yesterday she developed nausea, vomiting, and diarrhea.  She denies any blood in her emesis or blood in the stool.  She reports that her son has similar symptoms.  She did not receive the influenza vaccine this year.  She has not taken anything for her symptoms.  She denies chest pain, SOB, abdominal pain, urinary symptoms, neck pain, or neck stiffness.    The history is provided by the patient.    Past Medical History  Diagnosis Date  . Obesity   . Breast abscess   . HTN (hypertension)   . Irregular periods/menstrual cycles   . Tobacco abuse   . Obesity   . Tobacco abuse   . Mastitis    Past Surgical History  Procedure Laterality Date  . Incision and drainage breast abscess    . Cholecystectomy  2002   Family History  Problem Relation Age of Onset  . Hypertension Father   . Cancer Mother   . Hypertension Mother    History  Substance Use Topics  . Smoking status: Former Smoker -- 0.50 packs/day    Types: Cigarettes    Quit date: 11/30/2011  . Smokeless tobacco: Never Used     Comment: "used to smoke 1/2 to 1ppd depending on stress"  . Alcohol Use: 1.8 oz/week    3 Glasses of wine per week     Comment: occassional   OB History   Grav Para Term Preterm Abortions TAB SAB Ect Mult Living                 Review of Systems  All other systems reviewed and are negative.    Allergies  Review of patient's allergies indicates no known  allergies.  Home Medications   Current Outpatient Rx  Name  Route  Sig  Dispense  Refill  . hydrochlorothiazide (HYDRODIURIL) 25 MG tablet   Oral   Take 1 tablet (25 mg total) by mouth daily.   30 tablet   2   . naproxen sodium (ALEVE) 220 MG tablet   Oral   Take 220 mg by mouth as needed (fever).           BP 127/70  Pulse 96  Temp(Src) 99.7 F (37.6 C) (Oral)  Resp 18  SpO2 95% Physical Exam  Nursing note and vitals reviewed. Constitutional: She appears well-developed and well-nourished.  HENT:  Head: Normocephalic and atraumatic.  Right Ear: Tympanic membrane and ear canal normal.  Left Ear: Tympanic membrane and ear canal normal.  Nose: Nose normal.  Mouth/Throat: Uvula is midline, oropharynx is clear and moist and mucous membranes are normal.  Neck: Normal range of motion. Neck supple.  Cardiovascular: Normal rate, regular rhythm and normal heart sounds.   Pulmonary/Chest: Effort normal and breath sounds normal.  Abdominal: Soft. Bowel sounds are normal. She exhibits no distension and no mass. There is no tenderness. There is no rebound and no guarding.  Musculoskeletal: Normal range of motion.  Neurological: She is  alert.  Skin: Skin is warm and dry.  Psychiatric: She has a normal mood and affect.    ED Course  Procedures (including critical care time) Labs Review Labs Reviewed - No data to display Imaging Review No results found.  EKG Interpretation   None       MDM  No diagnosis found. Patient with symptoms consistent with influenza.  Vitals are stable, low-grade fever.  No signs of dehydration, tolerating PO's.  Lungs are clear. Due to patient's presentation and physical exam a chest x-ray was not ordered bc likely diagnosis of flu.  Discussed the cost versus benefit of Tamiflu treatment with the patient.  The patient understands that symptoms are greater than the recommended 24-48 hour window of treatment.  Patient will be discharged with  instructions to orally hydrate, rest, and use over-the-counter medications such as anti-inflammatories ibuprofen and Aleve for muscle aches and Tylenol for fever.  Patient stable for discharge.  Return precautions given.    Santiago Glad, PA-C 04/01/13 [phone]

## 2013-04-03 NOTE — ED Provider Notes (Signed)
Medical screening examination/treatment/procedure(s) were performed by non-physician practitioner and as supervising physician I was immediately available for consultation/collaboration.  EKG Interpretation   None         Malvin Johns, MD 04/03/13 [phone]

## 2013-07-05 ENCOUNTER — Ambulatory Visit: Payer: Self-pay

## 2013-07-06 ENCOUNTER — Ambulatory Visit (INDEPENDENT_AMBULATORY_CARE_PROVIDER_SITE_OTHER): Payer: Self-pay | Admitting: Family Medicine

## 2013-07-06 ENCOUNTER — Encounter: Payer: Self-pay | Admitting: Family Medicine

## 2013-07-06 VITALS — BP 138/93 | HR 96 | Temp 98.6°F | Ht 63.0 in | Wt 252.0 lb

## 2013-07-06 DIAGNOSIS — I10 Essential (primary) hypertension: Secondary | ICD-10-CM

## 2013-07-06 DIAGNOSIS — G5600 Carpal tunnel syndrome, unspecified upper limb: Secondary | ICD-10-CM

## 2013-07-06 MED ORDER — HYDROCHLOROTHIAZIDE 12.5 MG PO TABS: 12.5000 mg | ORAL_TABLET | Freq: Every day | ORAL | Status: DC

## 2013-07-06 MED ORDER — LEVOFLOXACIN 750 MG PO TABS: 750.0000 mg | ORAL_TABLET | Freq: Every day | ORAL | Status: DC

## 2013-07-06 NOTE — Patient Instructions (Signed)
Follow up in 1 month for the blood pressure and the carpal tunnel.    Carpal Tunnel Syndrome The carpal tunnel is a narrow area located on the palm side of your wrist. The tunnel is formed by the wrist bones and ligaments. Nerves, blood vessels, and tendons pass through the carpal tunnel. Repeated wrist motion or certain diseases may cause swelling within the tunnel. This swelling pinches the main nerve in the wrist (median nerve) and causes the painful hand and arm condition called carpal tunnel syndrome. CAUSES   Repeated wrist motions.  Wrist injuries.  Certain diseases like arthritis, diabetes, alcoholism, hyperthyroidism, and kidney failure.  Obesity.  Pregnancy. SYMPTOMS   A "pins and needles" feeling in your fingers or hand.  Tingling or numbness in your fingers or hand.  An aching feeling in your entire arm.  Wrist pain that goes up your arm to your shoulder.  Pain that goes down into your palm or fingers.  A weak feeling in your hands. DIAGNOSIS  Your caregiver will take your history and perform a physical exam. An electromyography test may be needed. This test measures electrical signals sent out by the muscles. The electrical signals are usually slowed by carpal tunnel syndrome. You may also need X-rays. TREATMENT  Carpal tunnel syndrome may clear up by itself. Your caregiver may recommend a wrist splint or medicine such as a nonsteroidal anti-inflammatory medicine. Cortisone injections may help. Sometimes, surgery may be needed to free the pinched nerve.  HOME CARE INSTRUCTIONS   Take all medicine as directed by your caregiver. Only take over-the-counter or prescription medicines for pain, discomfort, or fever as directed by your caregiver.  If you were given a splint to keep your wrist from bending, wear it as directed. It is important to wear the splint at night. Wear the splint for as long as you have pain or numbness in your hand, arm, or wrist. This may take 1 to  2 months.  Rest your wrist from any activity that may be causing your pain. If your symptoms are work-related, you may need to talk to your employer about changing to a job that does not require using your wrist.  Put ice on your wrist after long periods of wrist activity.  Put ice in a plastic bag.  Place a towel between your skin and the bag.  Leave the ice on for 15-20 minutes, 03-04 times a day.  Keep all follow-up visits as directed by your caregiver. This includes any orthopedic referrals, physical therapy, and rehabilitation. Any delay in getting necessary care could result in a delay or failure of your condition to heal. SEEK IMMEDIATE MEDICAL CARE IF:   You have new, unexplained symptoms.  Your symptoms get worse and are not helped or controlled with medicines. MAKE SURE YOU:   Understand these instructions.  Will watch your condition.  Will get help right away if you are not doing well or get worse. Document Released: 03/14/2000 Document Revised: 06/09/2011 Document Reviewed: 01/31/2011 [facility] Patient Information 2014 [place], [place].

## 2013-07-07 ENCOUNTER — Encounter: Payer: Self-pay | Admitting: Family Medicine

## 2013-07-07 DIAGNOSIS — G5601 Carpal tunnel syndrome, right upper limb: Secondary | ICD-10-CM | POA: Insufficient documentation

## 2013-07-07 NOTE — Assessment & Plan Note (Addendum)
Symptoms and physical exam findings very consistent with carpal tunnel syndrome.  Will treat with cock up splint for 4 weeks and see how she does.  If not better, will likely send her to ortho for evaluation. Spoke about possibility of surgery with patient which she is not really ready for at this time.  Follow up in 1 month If not better, may also consider neck xray to rule out cervical involvement.

## 2013-07-07 NOTE — Assessment & Plan Note (Signed)
Patient on hctz 25mg  on and off.  Will try hctz 12.5mg  daily and follow up in 1 month.

## 2013-07-07 NOTE — Progress Notes (Signed)
Patient ID: STARR URIAS    DOB: [birth year], 38 y.o.   MRN: [id number] --- Subjective:  Cassidie is a 38 y.o.female who presents with bilateral hand numbness. Started a year ago. Getting worst. It's worst when she wakes up in morning or when she uses the car steering wheel. Numbness is in all her fingers. Right more than left. She is right handed. Opening jars has become more difficult. Some neck discomfort at times, but not regularly.  Numbness is better when she shakes hands and starts moving them.   ROS: see HPI Past Medical History: reviewed and updated medications and allergies. Social History: Tobacco: every day smoker  Objective: Filed Vitals:   07/06/13 1532  BP: 138/93  Pulse: 96  Temp: 98.6 F (37 C)    Physical Examination:   General appearance - alert, well appearing, and in no distress Neck - supple, some tenderness along right trapezoid muscle. No spinal tenderness. Negative Spurling's.  Hands - normal strength of fingers bilaterally, no thenar atrophy, normal sensation to light touch bilaterally Positive Tinnel's on left, positive phallen bilaterally. Positive Flick test.

## 2013-08-15 ENCOUNTER — Ambulatory Visit: Payer: Self-pay | Admitting: Family Medicine

## 2013-11-30 ENCOUNTER — Encounter (HOSPITAL_COMMUNITY): Payer: Self-pay | Admitting: Emergency Medicine

## 2013-11-30 ENCOUNTER — Emergency Department (HOSPITAL_COMMUNITY)
Admission: EM | Admit: 2013-11-30 | Discharge: 2013-12-01 | Disposition: A | Discharge status: Home / Self Care | Payer: Self-pay | Attending: Emergency Medicine | Admitting: Emergency Medicine

## 2013-11-30 DIAGNOSIS — E669 Obesity, unspecified: Secondary | ICD-10-CM | POA: Insufficient documentation

## 2013-11-30 DIAGNOSIS — F17200 Nicotine dependence, unspecified, uncomplicated: Secondary | ICD-10-CM | POA: Insufficient documentation

## 2013-11-30 DIAGNOSIS — R42 Dizziness and giddiness: Secondary | ICD-10-CM | POA: Insufficient documentation

## 2013-11-30 DIAGNOSIS — Z79899 Other long term (current) drug therapy: Secondary | ICD-10-CM | POA: Insufficient documentation

## 2013-11-30 DIAGNOSIS — I10 Essential (primary) hypertension: Secondary | ICD-10-CM | POA: Insufficient documentation

## 2013-11-30 DIAGNOSIS — Z8742 Personal history of other diseases of the female genital tract: Secondary | ICD-10-CM | POA: Insufficient documentation

## 2013-11-30 DIAGNOSIS — Z3202 Encounter for pregnancy test, result negative: Secondary | ICD-10-CM | POA: Insufficient documentation

## 2013-11-30 LAB — BASIC METABOLIC PANEL
Anion gap: 13 (ref 5–15)
BUN: 18 mg/dL (ref 6–23)
CHLORIDE: 101 meq/L (ref 96–112)
CO2: 25 meq/L (ref 19–32)
Calcium: 9.6 mg/dL (ref 8.4–10.5)
Creatinine, Ser: 1.12 mg/dL — ABNORMAL HIGH (ref 0.50–1.10)
GFR calc Af Amer: 72 mL/min — ABNORMAL LOW (ref >90)
GFR calc non Af Amer: 62 mL/min — ABNORMAL LOW (ref >90)
GLUCOSE: 89 mg/dL (ref 70–99)
POTASSIUM: 4.2 meq/L (ref 3.7–5.3)
Sodium: 139 mEq/L (ref 137–147)

## 2013-11-30 LAB — CBC
HEMATOCRIT: 42.3 % (ref 36.0–46.0)
HEMOGLOBIN: 14.3 g/dL (ref 12.0–15.0)
MCH: 31 pg (ref 26.0–34.0)
MCHC: 33.8 g/dL (ref 30.0–36.0)
MCV: 91.6 fL (ref 78.0–100.0)
Platelets: 333 10*3/uL (ref 150–400)
RBC: 4.62 MIL/uL (ref 3.87–5.11)
RDW: 12.5 % (ref 11.5–15.5)
WBC: 8 10*3/uL (ref 4.0–10.5)

## 2013-11-30 LAB — POC URINE PREG, ED: Preg Test, Ur: NEGATIVE

## 2013-11-30 LAB — CBG MONITORING, ED: Glucose-Capillary: 91 mg/dL (ref 70–99)

## 2013-11-30 NOTE — ED Notes (Signed)
Pt states that she began to have episodes of dizziness that began last pm; pt states that she was laying down and felt pounding in her ears and felt like the room was spinning; pt states that she has had several episodes of the same dizziness today; pt states that her vision is fuzzy during the episodes as well; pt states that if she goes somewhere quiet and relaxes that the symptoms resolve but then return later.

## 2013-12-01 MED ORDER — MECLIZINE HCL 50 MG PO TABS: 25.0000 mg | ORAL_TABLET | Freq: Three times a day (TID) | ORAL | Status: DC | PRN

## 2013-12-01 NOTE — Discharge Instructions (Signed)
Your workup today has not shown a specific cause for your dizziness.  Drink plenty of fluids.  Rest.  Follow up with your doctor if not improving.   Dizziness Dizziness is a common problem. It is a feeling of unsteadiness or light-headedness. You may feel like you are about to faint. Dizziness can lead to injury if you stumble or fall. A person of any age group can suffer from dizziness, but dizziness is more common in older adults. CAUSES  Dizziness can be caused by many different things, including:  Middle ear problems.  Standing for too long.  Infections.  An allergic reaction.  Aging.  An emotional response to something, such as the sight of blood.  Side effects of medicines.  Tiredness.  Problems with circulation or blood pressure.  Excessive use of alcohol or medicines, or illegal drug use.  Breathing too fast (hyperventilation).  An irregular heart rhythm (arrhythmia).  A low red blood cell count (anemia).  Pregnancy.  Vomiting, diarrhea, fever, or other illnesses that cause body fluid loss (dehydration).  Diseases or conditions such as Parkinson's disease, high blood pressure (hypertension), diabetes, and thyroid problems.  Exposure to extreme heat. DIAGNOSIS  Your health care provider will ask about your symptoms, perform a physical exam, and perform an electrocardiogram (ECG) to record the electrical activity of your heart. Your health care provider may also perform other heart or blood tests to determine the cause of your dizziness. These may include:  Transthoracic echocardiogram (TTE). During echocardiography, sound waves are used to evaluate how blood flows through your heart.  Transesophageal echocardiogram (TEE).  Cardiac monitoring. This allows your health care provider to monitor your heart rate and rhythm in real time.  Holter monitor. This is a portable device that records your heartbeat and can help diagnose heart arrhythmias. It allows your  health care provider to track your heart activity for several days if needed.  Stress tests by exercise or by giving medicine that makes the heart beat faster. TREATMENT  Treatment of dizziness depends on the cause of your symptoms and can vary greatly. HOME CARE INSTRUCTIONS   Drink enough fluids to keep your urine clear or pale yellow. This is especially important in very hot weather. In older adults, it is also important in cold weather.  Take your medicine exactly as directed if your dizziness is caused by medicines. When taking blood pressure medicines, it is especially important to get up slowly.  Rise slowly from chairs and steady yourself until you feel okay.  In the morning, first sit up on the side of the bed. When you feel okay, stand slowly while holding onto something until you know your balance is fine.  Move your legs often if you need to stand in one place for a long time. Tighten and relax your muscles in your legs while standing.  Have someone stay with you for 1-2 days if dizziness continues to be a problem. Do this until you feel you are well enough to stay alone. Have the person call your health care provider if he or she notices changes in you that are concerning.  Do not drive or use heavy machinery if you feel dizzy.  Do not drink alcohol. SEEK IMMEDIATE MEDICAL CARE IF:   Your dizziness or light-headedness gets worse.  You feel nauseous or vomit.  You have problems talking, walking, or using your arms, hands, or legs.  You feel weak.  You are not thinking clearly or you have trouble forming sentences.  It may take a friend or family member to notice this.  You have chest pain, abdominal pain, shortness of breath, or sweating.  Your vision changes.  You notice any bleeding.  You have side effects from medicine that seems to be getting worse rather than better. MAKE SURE YOU:   Understand these instructions.  Will watch your condition.  Will get  help right away if you are not doing well or get worse. Document Released: 09/10/2000 Document Revised: 03/22/2013 Document Reviewed: 10/04/2010 [id number] Patient Information 2015 [place], [place]. This information is not intended to replace advice given to you by your health care provider. Make sure you discuss any questions you have with your health care provider.

## 2013-12-01 NOTE — ED Provider Notes (Signed)
CSN: [id number]     Arrival date & time 11/30/13  2032 History   First MD Initiated Contact with Patient 12/01/13 0043     Chief Complaint  Patient presents with  . Dizziness     (Consider location/radiation/quality/duration/timing/severity/associated sxs/prior Treatment) HPI  38 year old female presents to the emergency department from home with complaint of dizziness.  Patient is vague about her description of the dizziness.  She reports sometimes it is a spinning sensation, but mostly it is a wooziness about to pass out weakness feeling.  She reports symptoms started last night and she was laying in bed.  She denies rolling over in bed or other sudden movement prompting onset of symptoms.  She reports throughout the day today, she has felt extremely tired and sleepy.  She has felt hot at times.  Symptoms improve if she wears sunglasses and is in a quiet place.  She denies any fever chills, no nausea vomiting or diarrhea.  Patient has felt that her blood pressure was elevated no headache.  No focal weakness or numbness.  No difficulties eating drinking walking.    Past Medical History  Diagnosis Date  . Obesity   . Breast abscess   . HTN (hypertension)   . Irregular periods/menstrual cycles   . Tobacco abuse   . Obesity   . Tobacco abuse   . Mastitis    Past Surgical History  Procedure Laterality Date  . Incision and drainage breast abscess    . Cholecystectomy  2002   Family History  Problem Relation Age of Onset  . Hypertension Father   . Cancer Mother   . Hypertension Mother    History  Substance Use Topics  . Smoking status: Current Every Day Smoker -- 0.50 packs/day    Types: Cigarettes  . Smokeless tobacco: Never Used     Comment: "used to smoke 1/2 to 1ppd depending on stress"  . Alcohol Use: 1.8 oz/week    3 Glasses of wine per week     Comment: occassional   OB History   Grav Para Term Preterm Abortions TAB SAB Ect Mult Living                 Review of  Systems   See History of Present Illness; otherwise all other systems are reviewed and negative  Allergies  Review of patient's allergies indicates no known allergies.  Home Medications   Prior to Admission medications   Medication Sig Start Date End Date Taking? Authorizing Provider  hydrochlorothiazide (HYDRODIURIL) 25 MG tablet Take 25 mg by mouth daily.   Yes Historical Provider, MD  naproxen sodium (ALEVE) 220 MG tablet Take 220 mg by mouth as needed (fever).    Yes Historical Provider, MD  meclizine (ANTIVERT) 50 MG tablet Take 0.5 tablets (25 mg total) by mouth 3 (three) times daily as needed for dizziness or nausea. 12/01/13   Kalman Drape, MD   BP 153/94  Pulse 68  Temp(Src) 98.2 F (36.8 C) (Oral)  Resp 18  SpO2 96%  LMP 11/02/2013 Physical Exam  Nursing note and vitals reviewed. Constitutional: She is oriented to person, place, and time. She appears well-developed and well-nourished.  HENT:  Head: Normocephalic and atraumatic.  Right Ear: External ear normal.  Left Ear: External ear normal.  Nose: Nose normal.  Mouth/Throat: Oropharynx is clear and moist.  Eyes: Conjunctivae and EOM are normal. Pupils are equal, round, and reactive to light.  Neck: Normal range of motion. Neck supple. No JVD  present. No tracheal deviation present. No thyromegaly present.  Cardiovascular: Normal rate, regular rhythm, normal heart sounds and intact distal pulses.  Exam reveals no gallop and no friction rub.   No murmur heard. Pulmonary/Chest: Effort normal and breath sounds normal. No stridor. No respiratory distress. She has no wheezes. She has no rales. She exhibits no tenderness.  Abdominal: Soft. Bowel sounds are normal. She exhibits no distension and no mass. There is no tenderness. There is no rebound and no guarding.  Musculoskeletal: Normal range of motion. She exhibits no edema and no tenderness.  Lymphadenopathy:    She has no cervical adenopathy.  Neurological: She is alert  and oriented to person, place, and time. She has normal reflexes. No cranial nerve deficit. She exhibits normal muscle tone. Coordination normal.  No nystagmus elicited with rapid head movement  Skin: Skin is warm and dry. No rash noted. No erythema. No pallor.  Psychiatric: She has a normal mood and affect. Her behavior is normal. Judgment and thought content normal.    ED Course  Procedures (including critical care time) Labs Review Labs Reviewed  BASIC METABOLIC PANEL - Abnormal; Notable for the following:    Creatinine, Ser 1.12 (*)    GFR calc non Af Amer 62 (*)    GFR calc Af Amer 72 (*)    All other components within normal limits  CBC  CBG MONITORING, ED  POC URINE PREG, ED    Imaging Review No results found.   EKG Interpretation   Date/Time:  Wednesday November 30 2013 22:03:32 EDT Ventricular Rate:  69 PR Interval:  168 QRS Duration: 74 QT Interval:  375 QTC Calculation: 402 R Axis:   62 Text Interpretation:  Sinus rhythm Confirmed by Earley Grobe  MD, Darriel Sinquefield ([id number]) on  11/30/2013 11:54:51 PM      MDM   Final diagnoses:  Dizziness, nonspecific   38 year old female with dizziness.  Do not feel symptoms are secondary to a cerebellar infarct.  Her workup here has been unremarkable.  Patient is stable for discharge and followup with primary care Dr.   Kalman Drape, MD 12/01/13 [phone]

## 2014-01-12 ENCOUNTER — Emergency Department (HOSPITAL_COMMUNITY)
Admission: EM | Admit: 2014-01-12 | Discharge: 2014-01-12 | Disposition: A | Discharge status: Home / Self Care | Payer: Self-pay | Attending: Emergency Medicine | Admitting: Emergency Medicine

## 2014-01-12 ENCOUNTER — Encounter (HOSPITAL_COMMUNITY): Payer: Self-pay | Admitting: Emergency Medicine

## 2014-01-12 DIAGNOSIS — Z79899 Other long term (current) drug therapy: Secondary | ICD-10-CM | POA: Insufficient documentation

## 2014-01-12 DIAGNOSIS — N61 Inflammatory disorders of breast: Secondary | ICD-10-CM | POA: Insufficient documentation

## 2014-01-12 DIAGNOSIS — E669 Obesity, unspecified: Secondary | ICD-10-CM | POA: Insufficient documentation

## 2014-01-12 DIAGNOSIS — I10 Essential (primary) hypertension: Secondary | ICD-10-CM | POA: Insufficient documentation

## 2014-01-12 DIAGNOSIS — Z720 Tobacco use: Secondary | ICD-10-CM | POA: Insufficient documentation

## 2014-01-12 MED ORDER — SULFAMETHOXAZOLE-TMP DS 800-160 MG PO TABS
1.0000 | ORAL_TABLET | Freq: Once | ORAL | Status: AC
  Administered 2014-01-12: 1 via ORAL
  Filled 2014-01-12: qty 1

## 2014-01-12 MED ORDER — SULFAMETHOXAZOLE-TRIMETHOPRIM 800-160 MG PO TABS: 1.0000 | ORAL_TABLET | Freq: Two times a day (BID) | ORAL | Status: DC

## 2014-01-12 MED ORDER — HYDROCODONE-ACETAMINOPHEN 5-325 MG PO TABS
2.0000 | ORAL_TABLET | Freq: Once | ORAL | Status: AC
  Administered 2014-01-12: 2 via ORAL
  Filled 2014-01-12: qty 2

## 2014-01-12 MED ORDER — HYDROCODONE-ACETAMINOPHEN 5-325 MG PO TABS
1.0000 | ORAL_TABLET | ORAL | Status: DC | PRN
Start: 2014-01-12 — End: 2015-07-02

## 2014-01-12 MED ORDER — CEPHALEXIN 500 MG PO CAPS: 500.0000 mg | ORAL_CAPSULE | Freq: Four times a day (QID) | ORAL | Status: DC

## 2014-01-12 MED ORDER — CEPHALEXIN 500 MG PO CAPS
500.0000 mg | ORAL_CAPSULE | Freq: Once | ORAL | Status: AC
  Administered 2014-01-12: 500 mg via ORAL
  Filled 2014-01-12: qty 1

## 2014-01-12 NOTE — ED Provider Notes (Signed)
Medical screening examination/treatment/procedure(s) were performed by non-physician practitioner and as supervising physician I was immediately available for consultation/collaboration.   EKG Interpretation None        Debby Freiberg, MD 01/12/14 2243

## 2014-01-12 NOTE — ED Notes (Signed)
Pt c/o left breast pain; pt states that she has hx of abscess and that she normally has drainage but that area has healed and no drainage coming out; pt reports that "it has been awhile since I had drainage from my breast"; pt c/o tenderness and pressure to left breast

## 2014-01-12 NOTE — ED Provider Notes (Signed)
CSN: [id number]     Arrival date & time 01/12/14  4034 History   First MD Initiated Contact with Patient 01/12/14 0340     Chief Complaint  Patient presents with  . Breast Pain     (Consider location/radiation/quality/duration/timing/severity/associated sxs/prior Treatment) HPI Comments: Patient is 38 year old female who presents with left breast pain for the past 3 days. Symptoms started gradually and progressively worsened since the onset. The pain is throbbing and severe without radiation. Palpation makes the pain worse. Patient reports a history of mastitis and abscess of the same breast. Patient denies drainage from the nipple or any other area. Patient has not tried anything for symptoms. No aggravating/alleviating factors.    Past Medical History  Diagnosis Date  . Obesity   . Breast abscess   . HTN (hypertension)   . Irregular periods/menstrual cycles   . Tobacco abuse   . Obesity   . Tobacco abuse   . Mastitis    Past Surgical History  Procedure Laterality Date  . Incision and drainage breast abscess    . Cholecystectomy  2002   Family History  Problem Relation Age of Onset  . Hypertension Father   . Cancer Mother   . Hypertension Mother    History  Substance Use Topics  . Smoking status: Current Every Day Smoker -- 0.50 packs/day    Types: Cigarettes  . Smokeless tobacco: Never Used     Comment: "used to smoke 1/2 to 1ppd depending on stress"  . Alcohol Use: 1.8 oz/week    3 Glasses of wine per week     Comment: occassional   OB History   Grav Para Term Preterm Abortions TAB SAB Ect Mult Living                 Review of Systems  Constitutional: Negative for fever, chills and fatigue.  HENT: Negative for trouble swallowing.   Eyes: Negative for visual disturbance.  Respiratory: Negative for shortness of breath.   Cardiovascular: Negative for chest pain and palpitations.  Gastrointestinal: Negative for nausea, vomiting, abdominal pain and diarrhea.   Genitourinary: Negative for dysuria and difficulty urinating.  Musculoskeletal: Negative for arthralgias and neck pain.  Skin: Positive for wound. Negative for color change.  Neurological: Negative for dizziness and weakness.  Psychiatric/Behavioral: Negative for dysphoric mood.      Allergies  Review of patient's allergies indicates no known allergies.  Home Medications   Prior to Admission medications   Medication Sig Start Date End Date Taking? Authorizing Provider  hydrochlorothiazide (HYDRODIURIL) 25 MG tablet Take 25 mg by mouth daily.    Historical Provider, MD  meclizine (ANTIVERT) 50 MG tablet Take 0.5 tablets (25 mg total) by mouth 3 (three) times daily as needed for dizziness or nausea. 12/01/13   Kalman Drape, MD  naproxen sodium (ALEVE) 220 MG tablet Take 220 mg by mouth as needed (fever).     Historical Provider, MD   BP 178/105  Pulse 94  Temp(Src) 98.2 F (36.8 C) (Oral)  Resp 18  Ht 5\' 3"  (1.6 m)  Wt 248 lb (112.492 kg)  BMI 43.94 kg/m2  SpO2 98%  LMP 01/05/2014 Physical Exam  Nursing note and vitals reviewed. Constitutional: She is oriented to person, place, and time. She appears well-developed and well-nourished. No distress.  HENT:  Head: Normocephalic and atraumatic.  Eyes: Conjunctivae and EOM are normal.  Neck: Normal range of motion.  Cardiovascular: Normal rate and regular rhythm.  Exam reveals no gallop  and no friction rub.   No murmur heard. Pulmonary/Chest: Effort normal and breath sounds normal. She has no wheezes. She has no rales. She exhibits no tenderness.  Abdominal: Soft. There is no tenderness.  Musculoskeletal: Normal range of motion.  Neurological: She is alert and oriented to person, place, and time. Coordination normal.  Speech is goal-oriented. Moves limbs without ataxia.   Skin: Skin is warm and dry.  Erythema, warm and extreme tenderness to left breast around the areola and nipple area. No drainage or fluctuance noted. No open  wound.   Psychiatric: She has a normal mood and affect. Her behavior is normal.    ED Course  Procedures (including critical care time) Labs Review Labs Reviewed - No data to display  Imaging Review No results found.   EKG Interpretation None      MDM   Final diagnoses:  Cellulitis of female breast    3:50 AM Patient has left breast cellulitis. Vitals stable and patient afebrile. Patient will have bactrim, keflex and vicodin for symptoms. Patient instructed to return with worsening or concerning symptoms.     Alvina Chou, PA-C 01/12/14 0403

## 2014-01-12 NOTE — Discharge Instructions (Signed)
Take Bactrim and Keflex as directed until gone. Take Vicodin as needed for pain. Refer to attached documents for more information. Return to the ED with worsening or concerning symptoms.

## 2014-02-15 ENCOUNTER — Ambulatory Visit (INDEPENDENT_AMBULATORY_CARE_PROVIDER_SITE_OTHER): Payer: PRIVATE HEALTH INSURANCE | Admitting: Family Medicine

## 2014-02-15 VITALS — BP 167/118 | HR 87 | Temp 98.3°F | Ht 63.0 in | Wt 247.0 lb

## 2014-02-15 DIAGNOSIS — R202 Paresthesia of skin: Secondary | ICD-10-CM

## 2014-02-15 DIAGNOSIS — I10 Essential (primary) hypertension: Secondary | ICD-10-CM

## 2014-02-15 DIAGNOSIS — R739 Hyperglycemia, unspecified: Secondary | ICD-10-CM

## 2014-02-15 LAB — POCT GLYCOSYLATED HEMOGLOBIN (HGB A1C): HEMOGLOBIN A1C: 5.7

## 2014-02-15 MED ORDER — CETIRIZINE HCL 10 MG PO TABS: 10.0000 mg | ORAL_TABLET | Freq: Every day | ORAL | Status: DC

## 2014-02-15 MED ORDER — HYDROCHLOROTHIAZIDE 25 MG PO TABS: 25.0000 mg | ORAL_TABLET | Freq: Every day | ORAL | Status: DC

## 2014-02-15 NOTE — Progress Notes (Signed)
   Subjective:    Patient ID: Claudia Wall, female    DOB: 1975/04/13, 38 y.o.   MRN: [id number]  HPI  Patient presents for Same Day Appointment  CC: tingling  # Hand and feet tingling  Feet tingling, initially states it is the top but later says it is bottom during exam; present for 2 weeks. Worse after sitting on toilet  Hand tingling: present for past 1 week. Initially states it is primarily when she wakes up but then states it is while she is on the couch. Cannot tell where exactly the tingling is occuring on her hands.  Denies any pain, denies trauma to the areas.  She is concerned about her "sugar"  She currently is not having any of the issues today  # Hypertension:  Says she has not been on her hypertensive med (hctz) as it was discontinued >5 months ago  Denies headache, changes in vision, chest pain  Review of Systems   She endorses feeling cold in the exam room. See HPI for ROS. All other systems reviewed and are negative.  Past medical history, surgical, family, and social history reviewed and updated in the EMR as appropriate.  Objective:  BP 167/118 mmHg  Pulse 87  Temp(Src) 98.3 F (36.8 C) (Oral)  Ht 5\' 3"  (1.6 m)  Wt 247 lb (112.038 kg)  BMI 43.76 kg/m2 Vitals reviewed  General: NAD, laying on exam table curled up in blanket CV: RRR, normal s1 and s2, no mrg Resp: CTAB Ext: no deformity of hands or feet, ROM intact. Skin: dry and scaled bottom of feet bilaterally, no rashes noted Neuro: alert and oriented, no focal deficits. Sensation intact in both hands and feet bilaterally Psych: somewhat increased rate of speech  Assessment & Plan:  See Problem List Documentation

## 2014-02-15 NOTE — Assessment & Plan Note (Signed)
Elevated BP today and prior office visits over past month. Will restart hctz and f/u nursing bp visit in 1 week.

## 2014-02-15 NOTE — Patient Instructions (Signed)
Try to keep your feet moisturized (not with vaseline, with an actual moisturzing cream). Start the zyrtec daily to help with itching.  Start the hctz for your blood pressure. Come back in about 1 week for a nursing visit to check your blood pressure.  F/u with pcp in 1 month

## 2014-02-15 NOTE — Assessment & Plan Note (Addendum)
Feet likely from prolonged sitting, hand tingling not as clear. Pts story changed halfway through visit. At present she denies tingling in either hands or feet in the office. Pt does have a history of carpal tunnel and uses wrist guards sometimes, however she cannot tell me the exact distribution of her tingling. I wonder if anxiety appears to be playing a role in her symptoms as she was acting anxious during visit. Plan: moisturizer for feet, zyrtec for itching. F/u if not improving.

## 2014-02-16 ENCOUNTER — Telehealth: Payer: Self-pay | Admitting: Family Medicine

## 2014-02-16 NOTE — Telephone Encounter (Signed)
Emergency Line / After Hours Call  Pt called after hours line due to fever. Checked temp and got 101.6. Had fever last night as well, and it recurred tonight. Has hx of mastitis of L breast but thinks it's not flared up. Has mild dry cough. Recently quit smoking. No burning with urination or back pain. Feels dizzy and kind of out of it. Recommended pt either go to the ER tonight, or call clinic in the AM if she feels this can wait until the morning. Pt stated she would go to the ER tonight.  Chrisandra Netters, MD Family Medicine PGY-3

## 2014-03-08 ENCOUNTER — Other Ambulatory Visit (HOSPITAL_COMMUNITY)
Admission: RE | Admit: 2014-03-08 | Discharge: 2014-03-08 | Disposition: A | Discharge status: Home / Self Care | Payer: PRIVATE HEALTH INSURANCE | Source: Ambulatory Visit | Attending: Family Medicine | Admitting: Family Medicine

## 2014-03-08 ENCOUNTER — Encounter: Payer: Self-pay | Admitting: Family Medicine

## 2014-03-08 ENCOUNTER — Ambulatory Visit (INDEPENDENT_AMBULATORY_CARE_PROVIDER_SITE_OTHER): Payer: PRIVATE HEALTH INSURANCE | Admitting: Family Medicine

## 2014-03-08 VITALS — BP 137/92 | HR 97 | Temp 98.0°F | Wt 242.0 lb

## 2014-03-08 DIAGNOSIS — Z113 Encounter for screening for infections with a predominantly sexual mode of transmission: Secondary | ICD-10-CM | POA: Diagnosis not present

## 2014-03-08 DIAGNOSIS — B9689 Other specified bacterial agents as the cause of diseases classified elsewhere: Secondary | ICD-10-CM | POA: Insufficient documentation

## 2014-03-08 DIAGNOSIS — N898 Other specified noninflammatory disorders of vagina: Secondary | ICD-10-CM

## 2014-03-08 DIAGNOSIS — N760 Acute vaginitis: Secondary | ICD-10-CM

## 2014-03-08 LAB — POCT WET PREP (WET MOUNT): Clue Cells Wet Prep Whiff POC: NEGATIVE

## 2014-03-08 MED ORDER — FLUCONAZOLE 150 MG PO TABS: 150.0000 mg | ORAL_TABLET | Freq: Once | ORAL | Status: DC

## 2014-03-08 MED ORDER — METRONIDAZOLE 500 MG PO TABS: 500.0000 mg | ORAL_TABLET | Freq: Three times a day (TID) | ORAL | Status: DC

## 2014-03-08 NOTE — Progress Notes (Signed)
   Subjective:    Patient ID: Claudia Wall, female    DOB: 1976-01-31, 38 y.o.   MRN: [id number]  HPI  Vaginal discharge: Patient presents with vaginal discharge, brown, moderate to severe in nature since Thanksgiving. She denies any changes in odor. She feels as if she has a internal irritation, but not externally. She did have sex with a new, female partner around Thanksgiving time. They used an old to tool, that was not properly cleaned and since she has had an infection. She is uncertain if she's had any sexual transmitted diseases at this time. She tried over-the-counter yeast cream, without resolution of symptoms. She has had problems with bacterial vaginosis for many years and prior to Thanksgiving she  was prescribed Keflex and Bactrim for mastitis, and it did make her vaginal discharge resolve.   Current every day smoker Past Medical History  Diagnosis Date  . Obesity   . Breast abscess   . HTN (hypertension)   . Irregular periods/menstrual cycles   . Tobacco abuse   . Obesity   . Tobacco abuse   . Mastitis    No Known Allergies    Review of Systems Per history of present illness    Objective:   Physical Exam BP 137/92 mmHg  Pulse 97  Temp(Src) 98 F (36.7 C) (Oral)  Wt 242 lb (109.77 kg)  LMP 02/15/2014 Gen: Pleasant, African-American female, no acute distress, nontoxic in appearance, well-developed, well-nourished. HEENT: AT. [place]. Bilateral eyes without injections or icterus. MMM. Abd: Soft. Mild tenderness right lower quadrant. ND. BS present. No Masses palpated.   GYN:  External genitalia within normal limits.  Vaginal mucosa pink, moist, normal rugae.  Nonfriable cervix without lesions, no bleeding noted on speculum exam.  Moderate white discharge.  No cervical motion tenderness. No adnexal masses bilaterally.           Assessment & Plan:

## 2014-03-08 NOTE — Patient Instructions (Signed)

## 2014-03-08 NOTE — Assessment & Plan Note (Addendum)
Discussed safe sex with patient today in detail, including hygiene. Wet prep was inconclusive today, I am at treating take her both bacterial vaginosis and yeast, discharge looked yeasty on presentation. Obtained gonorrhea and Chlamydia cultures.  We will call patient once results become available.

## 2014-03-09 ENCOUNTER — Telehealth: Payer: Self-pay | Admitting: Family Medicine

## 2014-03-09 LAB — HSV(HERPES SIMPLEX VRS) I + II AB-IGM: Herpes Simplex Vrs I&II-IgM Ab (EIA): 0.76 INDEX

## 2014-03-09 LAB — CERVICOVAGINAL ANCILLARY ONLY
Chlamydia: NEGATIVE
Neisseria Gonorrhea: NEGATIVE

## 2014-03-09 LAB — HSV(HERPES SIMPLEX VRS) I + II AB-IGG: HSV 2 GLYCOPROTEIN G AB, IGG: 18.2 IV — AB

## 2014-03-09 LAB — HIV ANTIBODY (ROUTINE TESTING W REFLEX): HIV 1&2 Ab, 4th Generation: NONREACTIVE

## 2014-03-09 NOTE — Telephone Encounter (Signed)
Please call patient, her gonorrhea, Chlamydia and HIV tests are negative. 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 It does not appear she is on daily suppression therapy for her herpes simplex 2, if she would like to consider this or have a discussion surrounding a she should contact her PCP. Thanks.

## 2014-03-10 NOTE — Telephone Encounter (Signed)
LVM for patient to call back. ?

## 2014-03-10 NOTE — Telephone Encounter (Signed)
Spoke with patient and informed her of below 

## 2014-03-14 ENCOUNTER — Telehealth: Payer: Self-pay | Admitting: Family Medicine

## 2014-03-14 NOTE — Telephone Encounter (Signed)
Will you please call the patient and have her come in for an appointment. Bactrim and cephalexin are normally not used to treat BV and her wet prep did not look incredibly convincing for BV at her last appointment with Dr. Raoul Pitch.  She may have a urinary tract infection (which would improve with Bactrim and/or Keflex).   Thanks, Archie Patten, MD [facility] Resident  03/14/2014, 2:17 PM  (Discussed this patient with Dr. Gwendlyn Deutscher)

## 2014-03-14 NOTE — Telephone Encounter (Signed)
Was given the generic for flagyl. It is not working. She says only smv/tma ds 800 160 tab interpharm generic for sulfamethoxavole/trimethopri The other one is cephalexin 500 cap lup that works for her. These two pills will take away her BV and this is what she wants.

## 2014-03-16 NOTE — Telephone Encounter (Signed)
Please let Ms. Haze know that I spoke with Dr. Raoul Pitch who evaluated her previously and I cannot write her a prescription for Bactrim and Keflex, however if she is continuing to have symptoms she should come into clinic to be re-evaluated.   Thanks, Archie Patten, MD [facility] Resident  03/16/2014, 1:50 PM '

## 2014-03-16 NOTE — Telephone Encounter (Signed)
Pt says" I dont have a UT" This doesn't make sense. I just need the medicine.  " I know what works for my body"

## 2014-03-16 NOTE — Telephone Encounter (Signed)
LMOVM for pt to call us back. [place], [place] CMA

## 2014-12-27 ENCOUNTER — Ambulatory Visit: Payer: PRIVATE HEALTH INSURANCE | Admitting: Family Medicine

## 2014-12-27 ENCOUNTER — Encounter: Payer: Self-pay | Admitting: Family Medicine

## 2014-12-27 ENCOUNTER — Ambulatory Visit (INDEPENDENT_AMBULATORY_CARE_PROVIDER_SITE_OTHER): Payer: PRIVATE HEALTH INSURANCE | Admitting: Family Medicine

## 2014-12-27 VITALS — BP 142/98 | HR 79 | Temp 98.3°F | Wt 245.9 lb

## 2014-12-27 DIAGNOSIS — R102 Pelvic and perineal pain: Secondary | ICD-10-CM | POA: Diagnosis not present

## 2014-12-27 DIAGNOSIS — N898 Other specified noninflammatory disorders of vagina: Secondary | ICD-10-CM | POA: Diagnosis not present

## 2014-12-27 LAB — POCT WET PREP (WET MOUNT): Clue Cells Wet Prep Whiff POC: POSITIVE

## 2014-12-27 MED ORDER — SULFAMETHOXAZOLE-TRIMETHOPRIM 800-160 MG PO TABS: 1.0000 | ORAL_TABLET | Freq: Two times a day (BID) | ORAL | Status: DC

## 2014-12-27 MED ORDER — IBUPROFEN 800 MG PO TABS: 800.0000 mg | ORAL_TABLET | Freq: Three times a day (TID) | ORAL | Status: DC | PRN

## 2014-12-27 NOTE — Progress Notes (Signed)
Subjective: CC: abdominal pain HPI: Patient is a 39 y.o. female presenting to clinic today for same day appt. Concerns today include:  1. Abdominal pain Patient reports that pain started about 1 day ago.  She describes the pain as stabbing in nature.  It is a twinge/10.  Nothing makes it worse.  Motrin 800mg  makes it better.  She notes that she has been spotting during this time.  Pain is different than normal premenstrual pain. She denies nausea, vomiting, diarrhea, constipation, fevers, postprandial pain. Patient's last menstrual period was 11/30/2014 (approximate).  Periods are normal.  Comes monthly, last about 7 days.  Heavy where she bleeds through pads.  Patient is homosexual.  Does not have sexual relations with men.  Does not use OCPs or hormonal meds because she is an active smoker.  Social History Reviewed: active smoker, smokes 7 cigs/ day. FamHx and MedHx updated.  Please see EMR. Health Maintenance: flu shot due  ROS: All other systems reviewed and are negative.  Objective: Office vital signs reviewed. BP 142/98 mmHg  Pulse 79  Temp(Src) 98.3 F (36.8 C) (Oral)  Wt 245 lb 14.4 oz (111.54 kg)  LMP 11/30/2014 (Approximate)  Physical Examination:  General: Awake, alert, obese, NAD HEENT: Normal, MMM GI: soft, NT/ND,+BS x4, no hepatomegaly, no splenomegaly GU: furuncle on inferior aspect of mons pubis, external vaginal tissue otherwise normal appearing, difficult to pass speculum through introitus 2/2 to patient being unable to relax, smallest speculum used.  Cervix visualized.  Moderate bleeding from os.  No odor or purulence appreciated.  No cervical motion TTP. No adnexal masses.  Results for orders placed or performed in visit on 12/27/14 (from the past 24 hour(s))  POCT Wet Prep [facility] [place])     Status: Abnormal   Collection Time: 12/27/14  4:42 PM  Result Value Ref Range   Source Wet Prep POC VAG    WBC, Wet Prep HPF POC 1-5    Bacteria Wet Prep HPF POC Few  None, Few   Clue Cells Wet Prep HPF POC Few (A) None   Clue Cells Wet Prep Whiff POC Positive Whiff    Yeast Wet Prep HPF POC None    Trichomonas Wet Prep HPF POC NONE    Assessment/ Plan: 39 y.o. female with  1. Pelvic pain in female.  Likely premenstrual pain in nature.  Discussed with patient re possibility of cystic ovary.  VERY LOW suspicion for ectopic pregnancy, provided that patient is homosexual and does not engage in sexual intercourse with men. - Patient to follow up after period is completed this upcoming week if pain persists - Would consider pelvic ultrasound at that time to evaluate for cystic ovary. - ibuprofen (ADVIL,MOTRIN) 800 MG tablet; Take 1 tablet (800 mg total) by mouth every 8 (eight) hours as needed.  Dispense: 30 tablet; Refill: 0 - sulfamethoxazole-trimethoprim (BACTRIM DS,SEPTRA DS) 800-160 MG tablet; Take 1 tablet by mouth 2 (two) times daily.  Dispense: 14 tablet; Refill: 0 - Return precautions reviewed.  2. Vaginal discharge.  Wet prep with BV.  Discussed at length the evidence behind use of Flagyl and Clinda, however patient adamant about using Septra.  Since Septra has some efficacy vs GNR, will give rx for this.  However, we discussed that if symptoms return or persist, she should consider using Clinda. - POCT Wet Prep ([place]) - sulfamethoxazole-trimethoprim (BACTRIM DS,SEPTRA DS) 800-160 MG tablet; Take 1 tablet by mouth 2 (two) times daily.  Dispense: 14 tablet; Refill: 0  Janora Norlander, DO PGY-2, [place]

## 2014-12-27 NOTE — Patient Instructions (Addendum)
Plan to follow up if symptoms have not improved after your period has stopped.  A very mild case of Bacterial vaginosis was see on miscroscopy.  I have sent in antibiotics for this.  Again, Keflex is does not fight against the bacteria that cause this infection.  Septra may, however, so I will send a supply of that into your pharmacy.  If your symptoms worsen, please return.  You may consider eating yogurt daily or taking a probiotic to prevent future BV infections.  If you develop severe pain, nausea, vomiting, or fevers , please seek immediate medical attention.  Ashly M. Lajuana Ripple, DO PGY-2, [facility] Pelvic Pain Female pelvic pain can be caused by many different things and start from a variety of places. Pelvic pain refers to pain that is located in the lower half of the abdomen and between your hips. The pain may occur over a short period of time (acute) or may be reoccurring (chronic). The cause of pelvic pain may be related to disorders affecting the female reproductive organs (gynecologic), but it may also be related to the bladder, kidney stones, an intestinal complication, or muscle or skeletal problems. Getting help right away for pelvic pain is important, especially if there has been severe, sharp, or a sudden onset of unusual pain. It is also important to get help right away because some types of pelvic pain can be life threatening.  CAUSES  Below are only some of the causes of pelvic pain. The causes of pelvic pain can be in one of several categories.   Gynecologic.  Pelvic inflammatory disease.  Sexually transmitted infection.  Ovarian cyst or a twisted ovarian ligament (ovarian torsion).  Uterine lining that grows outside the uterus (endometriosis).  Fibroids, cysts, or tumors.  Ovulation.  Pregnancy.  Pregnancy that occurs outside the uterus (ectopic pregnancy).  Miscarriage.  Labor.  Abruption of the placenta or ruptured uterus.  Infection.  Uterine  infection (endometritis).  Bladder infection.  Diverticulitis.  Miscarriage related to a uterine infection (septic abortion).  Bladder.  Inflammation of the bladder (cystitis).  Kidney stone(s).  Gastrointestinal.  Constipation.  Diverticulitis.  Neurologic.  Trauma.  Feeling pelvic pain because of mental or emotional causes (psychosomatic).  Cancers of the bowel or pelvis. EVALUATION  Your caregiver will want to take a careful history of your concerns. This includes recent changes in your health, a careful gynecologic history of your periods (menses), and a sexual history. Obtaining your family history and medical history is also important. Your caregiver may suggest a pelvic exam. A pelvic exam will help identify the location and severity of the pain. It also helps in the evaluation of which organ system may be involved. In order to identify the cause of the pelvic pain and be properly treated, your caregiver may order tests. These tests may include:   A pregnancy test.  Pelvic ultrasonography.  An X-ray exam of the abdomen.  A urinalysis or evaluation of vaginal discharge.  Blood tests. HOME CARE INSTRUCTIONS   Only take over-the-counter or prescription medicines for pain, discomfort, or fever as directed by your caregiver.   Rest as directed by your caregiver.   Eat a balanced diet.   Drink enough fluids to make your urine clear or pale yellow, or as directed.   Avoid sexual intercourse if it causes pain.   Apply warm or cold compresses to the lower abdomen depending on which one helps the pain.   Avoid stressful situations.   Keep a journal  of your pelvic pain. Write down when it started, where the pain is located, and if there are things that seem to be associated with the pain, such as food or your menstrual cycle.  Follow up with your caregiver as directed.  SEEK MEDICAL CARE IF:  Your medicine does not help your pain.  You have abnormal  vaginal discharge. SEEK IMMEDIATE MEDICAL CARE IF:   You have heavy bleeding from the vagina.   Your pelvic pain increases.   You feel light-headed or faint.   You have chills.   You have pain with urination or blood in your urine.   You have uncontrolled diarrhea or vomiting.   You have a fever or persistent symptoms for more than 3 days.  You have a fever and your symptoms suddenly get worse.   You are being physically or sexually abused.  MAKE SURE YOU:  Understand these instructions.  Will watch your condition.  Will get help if you are not doing well or get worse. Document Released: 02/12/2004 Document Revised: 08/01/2013 Document Reviewed: 07/07/2011 [facility] Patient Information 2015 [place], [place]. This information is not intended to replace advice given to you by your health care provider. Make sure you discuss any questions you have with your health care provider. Bacterial Vaginosis Bacterial vaginosis is an infection of the vagina. It happens when too many of certain germs (bacteria) grow in the vagina. HOME CARE  Take your medicine as told by your doctor.  Finish your medicine even if you start to feel better.  Do not have sex until you finish your medicine and are better.  Tell your sex partner that you have an infection. They should see their doctor for treatment.  Practice safe sex. Use condoms. Have only one sex partner. GET HELP IF:  You are not getting better after 3 days of treatment.  You have more grey fluid (discharge) coming from your vagina than before.  You have more pain than before.  You have a fever. MAKE SURE YOU:   Understand these instructions.  Will watch your condition.  Will get help right away if you are not doing well or get worse. Document Released: 12/25/2007 Document Revised: 01/05/2013 Document Reviewed: 10/27/2012 [facility] Patient Information 2015 [place], [place]. This information is not intended to replace  advice given to you by your health care provider. Make sure you discuss any questions you have with your health care provider.

## 2015-01-30 ENCOUNTER — Ambulatory Visit (INDEPENDENT_AMBULATORY_CARE_PROVIDER_SITE_OTHER): Payer: PRIVATE HEALTH INSURANCE | Admitting: Family Medicine

## 2015-01-30 ENCOUNTER — Encounter: Payer: Self-pay | Admitting: Family Medicine

## 2015-01-30 VITALS — BP 153/104 | HR 82 | Temp 98.9°F | Ht 63.0 in | Wt 251.6 lb

## 2015-01-30 DIAGNOSIS — J069 Acute upper respiratory infection, unspecified: Secondary | ICD-10-CM

## 2015-01-30 DIAGNOSIS — B9789 Other viral agents as the cause of diseases classified elsewhere: Principal | ICD-10-CM

## 2015-01-30 NOTE — Patient Instructions (Signed)
Your symptoms are due to a viral illness. Antibiotics will not help improve your symptoms, but the following will help you feel better while your body fights the virus.   Stay hydrated - drink a lot of water   Nasal Saline Spray  Congestion:   Nose spray: Afrin (Phenylephrine). DO NOT USE MORE THAN 3 DAYS  Oral: Pseudoephedrine  Sneezing & Runny nose: Cetirizine (Zyrtec), Fexofenadine (Allegra), Loratadine (Claritin)  Pain/Sore throat: Tylenol, Ibuprofen  Cough: Dextromethorphan, (Guaifenesin, "Mucinex") & Albuterol  Wash your hands often to prevent spreading the virus 

## 2015-01-30 NOTE — Progress Notes (Signed)
   Subjective:    Patient ID: Claudia Wall, female    DOB: 03-Sep-1975, 39 y.o.   MRN: [id number]  HPI  Patient presents for Same Day Appointment  Chief Complaint  Patient presents with  . URI    x 3 days with dry cough, chills (yesterday) some bodyaches and ears itching , runny nose   URI  Has been sick for 3 days. Nasal discharge: yes, clear Medications tried: ibuprofen for headache Sick contacts: son had small cold 1-2 weeks ago  Symptoms Fever: no Headache or face pain: yes, but all over not focal Tooth pain: no Sneezing: yes Scratchy throat: yes, but not sore Allergies: had when a kid Muscle aches: sore body Severe fatigue Stiff neck: no Shortness of breath: yes but often, smoker since she was 15 Rash: no Sore throat or swollen glands: no  ROS see HPI Smoking Status noted  Review of Systems   See HPI for ROS. All other systems reviewed and are negative.  Past medical history, surgical, family, and social history reviewed and updated in the EMR as appropriate.  Objective:  BP 153/104 mmHg  Pulse 82  Temp(Src) 98.9 F (37.2 C) (Oral)  Ht 5\' 3"  (1.6 m)  Wt 251 lb 9 oz (114.108 kg)  BMI 44.57 kg/m2  SpO2 98%  LMP 01/22/2015 (Approximate) Vitals and nursing note reviewed  General: NAD Eyes: normal conjunctiva and sclera  ENTM: clear rhinorrhea present. TMs pearly gray bilaterally, no effusion or erythema. CV: RRR, normal s1s2, no murmurs, rubs or gallop. 2+ radial pulses bilaterally  Resp: clear to auscultation bilaterally, normal effort Skin: no rashes Neuro: alert and oriented, no deficits Psych: normal thought content and speech  Assessment & Plan:  1. Viral URI with cough 3 day history. No red flags for bacterial sinus infection. OTC symptomatic treatment, return if not improving 1-2 weeks.

## 2015-03-08 ENCOUNTER — Other Ambulatory Visit: Payer: Self-pay | Admitting: Family Medicine

## 2015-07-02 ENCOUNTER — Ambulatory Visit (INDEPENDENT_AMBULATORY_CARE_PROVIDER_SITE_OTHER): Payer: PRIVATE HEALTH INSURANCE | Admitting: Family Medicine

## 2015-07-02 ENCOUNTER — Encounter: Payer: Self-pay | Admitting: Family Medicine

## 2015-07-02 VITALS — BP 158/87 | HR 77 | Temp 98.4°F | Ht 63.0 in | Wt 251.0 lb

## 2015-07-02 DIAGNOSIS — R002 Palpitations: Secondary | ICD-10-CM | POA: Diagnosis not present

## 2015-07-02 NOTE — Assessment & Plan Note (Signed)
Intermittent, none captured on exam today, without red flags for ACS. Primary cause is alcohol abuse. Also at risk with anxiety, smoking, obesity and HTN.  - Long discussion about cutting down on alcohol, high risk for alcoholism with + FH. Offered support for when/if she desires to stop drinking.  - To return for office visit with PCP for smoking cessation. Would consider wellbutrin which may also help with EtOH cravings.

## 2015-07-02 NOTE — Patient Instructions (Signed)
Thank you for coming in today!  Please make an appointment with your PCP to discuss stopping smoking!  Our clinic's number is [phone]. Feel free to call any time with questions or concerns. We will answer any questions after hours with our 24-hour emergency line at that number as well.   - Dr. Bonner Puna

## 2015-07-02 NOTE — Progress Notes (Signed)
Subjective: Claudia Wall is a 40 y.o. female presenting for palpitations.  She reports 2 months of intermittent palpitations that occur about twice a day (initially only once or twice a week), lasting 2 - 5 seconds. These episodes are independent of what she is doing, not related to exertion, position, or breathing. She has no actual dyspnea or chest pain with the episodes. These are sometimes related to stress at work and sometimes occur away from work on weekends.   She's been worked up with cardiac monitoring in the past which showed no arrhythmias, per pt report.   - ROS: No syncope. No cough, HA, vision changes, LE swelling, orthopnea, PND, N/V/D, polyuria, polydipsia, unintentional weight change. No recent medication changes.  - SH: Current every day smoker (somewhat interested in quitting), binge drinks 6 beers on Fridays, also drinks the occasional 5th of liquor, denies illicit drugs use.   Objective: BP 158/87 mmHg  Pulse 77  Temp(Src) 98.4 F (36.9 C) (Oral)  Ht 5\' 3"  (1.6 m)  Wt 251 lb (113.853 kg)  BMI 44.47 kg/m2  LMP 05/16/2015 (Approximate) Gen: Obese, well-appearing 40 y.o. female in no distress CV: Regular rate x 60 seconds, no murmur; radial, DP and PT pulses 2+ bilaterally; no LE edema, no JVD, cap refill < 2 sec. Pulm: Non-labored breathing ambient air; CTAB, no wheezes or crackles  Assessment/Plan: Claudia Wall is a 40 y.o. female here for intermittent palpitations.  Palpitations Intermittent, none captured on exam today, without red flags for ACS. Primary cause is alcohol abuse. Also at risk with anxiety, smoking, obesity and HTN.  - Long discussion about cutting down on alcohol, high risk for alcoholism with + FH. Offered support for when/if she desires to stop drinking.  - To return for office visit with PCP for smoking cessation. Would consider wellbutrin which may also help with EtOH cravings.

## 2015-07-05 ENCOUNTER — Ambulatory Visit (HOSPITAL_COMMUNITY)
Admission: EM | Admit: 2015-07-05 | Discharge: 2015-07-05 | Disposition: A | Discharge status: Home / Self Care | Payer: PRIVATE HEALTH INSURANCE | Attending: Emergency Medicine | Admitting: Emergency Medicine

## 2015-07-05 ENCOUNTER — Encounter (HOSPITAL_COMMUNITY): Payer: Self-pay | Admitting: Emergency Medicine

## 2015-07-05 DIAGNOSIS — N61 Mastitis without abscess: Secondary | ICD-10-CM

## 2015-07-05 MED ORDER — SULFAMETHOXAZOLE-TRIMETHOPRIM 800-160 MG PO TABS: 1.0000 | ORAL_TABLET | Freq: Two times a day (BID) | ORAL | Status: AC

## 2015-07-05 MED ORDER — HYDROCODONE-ACETAMINOPHEN 5-325 MG PO TABS: 1.0000 | ORAL_TABLET | Freq: Four times a day (QID) | ORAL | Status: DC | PRN

## 2015-07-05 MED ORDER — CEPHALEXIN 500 MG PO CAPS
500.0000 mg | ORAL_CAPSULE | Freq: Four times a day (QID) | ORAL | Status: DC
Start: 2015-07-05 — End: 2015-07-19

## 2015-07-05 NOTE — ED Provider Notes (Signed)
CSN: [id number]     Arrival date & time 07/05/15  1637 History   First MD Initiated Contact with Patient 07/05/15 1736     Chief Complaint  Patient presents with  . Abscess   (Consider location/radiation/quality/duration/timing/severity/associated sxs/prior Treatment) HPI  She is a 40 year old woman here for evaluation of left breast pain. This started about 2 days ago. She reports swelling and tenderness of the left breast around the nipple. There has been some redness. She denies any drainage or nipple discharge. She has a history of mastitis in the left breast previously. She states this feels similar. She has had a mammogram that was negative for cancer. She reports some nausea last night, but denies any fevers.  Past Medical History  Diagnosis Date  . Obesity   . Breast abscess   . HTN (hypertension)   . Irregular periods/menstrual cycles   . Tobacco abuse   . Obesity   . Tobacco abuse   . Mastitis    Past Surgical History  Procedure Laterality Date  . Incision and drainage breast abscess    . Cholecystectomy  2002   Family History  Problem Relation Age of Onset  . Hypertension Father   . Cancer Mother   . Hypertension Mother    Social History  Substance Use Topics  . Smoking status: Current Every Day Smoker -- 0.50 packs/day    Types: Cigarettes  . Smokeless tobacco: Never Used     Comment: "used to smoke 1/2 to 1ppd depending on stress"  . Alcohol Use: 1.8 oz/week    3 Glasses of wine per week     Comment: occassional   OB History    No data available     Review of Systems As in history of present illness Allergies  Review of patient's allergies indicates no known allergies.  Home Medications   Prior to Admission medications   Medication Sig Start Date End Date Taking? Authorizing Provider  hydrochlorothiazide (HYDRODIURIL) 25 MG tablet TAKE ONE TABLET BY MOUTH ONCE DAILY 03/09/15  Yes Archie Patten, MD  cephALEXin (KEFLEX) 500 MG capsule Take 1  capsule (500 mg total) by mouth 4 (four) times daily. 07/05/15   Melony Overly, MD  HYDROcodone-acetaminophen (NORCO) 5-325 MG tablet Take 1 tablet by mouth every 6 (six) hours as needed for moderate pain. 07/05/15   Melony Overly, MD  sulfamethoxazole-trimethoprim (BACTRIM DS,SEPTRA DS) 800-160 MG tablet Take 1 tablet by mouth 2 (two) times daily. 07/05/15 07/12/15  Melony Overly, MD   Meds Ordered and Administered this Visit  Medications - No data to display  BP 132/93 mmHg  Pulse 84  Temp(Src) 98.4 F (36.9 C) (Oral)  Resp 16  SpO2 100%  LMP 05/16/2015 (Approximate) No data found.   Physical Exam  Constitutional: She is oriented to person, place, and time. She appears well-developed and well-nourished. No distress.  Cardiovascular: Normal rate.   Pulmonary/Chest: Effort normal.  Genitourinary:  Left breast: There is tenderness and mild erythema surrounding the areola. She does have some induration of the area are. No focal abscess. No nipple discharge.  Neurological: She is alert and oriented to person, place, and time.    ED Course  Procedures (including critical care time)  Labs Review Labs Reviewed - No data to display  Imaging Review No results found.    MDM   1. Acute mastitis of left breast    We'll treat with Bactrim, Keflex, and Vicodin as this worked well for her in  2015. If not improving in 2 days, follow up in the emergency room.    Melony Overly, MD 07/05/15 1806

## 2015-07-05 NOTE — ED Notes (Signed)
Pt has a recurrent issue with abscesses in her left breast.  She was last treated for this at [facility] in October of 2015.  She was prescribed Septra, Keflex, and Norco and she said it worked well.  She noticed swelling two days ago and she does not want to wait until it comes to head to drain because it is very painful.  She states when they do drain, the drainage is usually a green color and the abscesses will make her sick.

## 2015-07-05 NOTE — Discharge Instructions (Signed)
Mastitis   Mastitis is redness, soreness, and puffiness (inflammation) in an area of the breast. It is often caused by an infection that occurs when bacteria enter the skin. The infection is often helped by antibiotic medicine.  HOME CARE  · Only take medicines as told by your doctor.  · If your doctor prescribed an antibiotic medicine, take it as told. Finish it even if you start to feel better.  · Do not wear a tight or underwire bra. Wear a soft support bra.  · Drink more fluids, especially if you have a fever.  · If you are breastfeeding:    Keep emptying the breast. Your doctor can tell you if the milk is safe. Use a breast pump if you are told to stop nursing.    Keep your nipples clean and dry.    Empty the first breast before going to the other breast. Use a breast pump if your baby is not emptying your breast.    If you go back to work, pump your breasts while at work.    Avoid letting your breasts get overly filled with milk (engorged).  GET HELP IF:  · You have pus-like fluid leaking from your breast.  · Your symptoms do not get better within 2 days.  GET HELP RIGHT AWAY IF:  · Your pain and puffiness are getting worse.  · Your pain is not helped by medicine.  · You have a red line going from your breast toward your armpit.  · You have a fever or lasting symptoms for more than 2-3 days.  · You have a fever and your symptoms suddenly get worse.  MAKE SURE YOU:   · Understand these instructions.  · Will watch your condition.  · Will get help right away if you are not doing well or get worse.     This information is not intended to replace advice given to you by your health care provider. Make sure you discuss any questions you have with your health care provider.     Document Released: 03/05/2009 Document Revised: 11/17/2012 Document Reviewed: 10/15/2012  Elsevier Interactive Patient Education ©2016 [employer]

## 2015-07-11 ENCOUNTER — Other Ambulatory Visit: Payer: Self-pay | Admitting: Family Medicine

## 2015-07-11 MED ORDER — HYDROCHLOROTHIAZIDE 25 MG PO TABS: 25.0000 mg | ORAL_TABLET | Freq: Every day | ORAL | Status: DC

## 2015-07-11 NOTE — Telephone Encounter (Signed)
Pt called and needs a refill on her Hydrochlorothiazide called in. She is out. Claudia Wall

## 2015-07-12 ENCOUNTER — Ambulatory Visit: Payer: PRIVATE HEALTH INSURANCE | Admitting: Family Medicine

## 2015-07-15 ENCOUNTER — Telehealth: Payer: Self-pay | Admitting: Family Medicine

## 2015-07-15 ENCOUNTER — Other Ambulatory Visit: Payer: Self-pay | Admitting: Family Medicine

## 2015-07-15 NOTE — Telephone Encounter (Signed)
After hours telephone call  Took 2 corticitin for cold and then got headache and took [place].  Wants to know if she took too much tylenol at once.  Advised patient to not take >1g of tylenol at once or >4g in one day.  Patient understands.  Virginia Crews, MD, MPH PGY-2,  [facility] 07/15/2015 9:37 PM

## 2015-07-16 NOTE — Telephone Encounter (Signed)
Refilled HCTZ x 3 month supply. Please have pt follow up in our clinic.  Thanks, Archie Patten, MD [facility] Resident  07/16/2015, 8:58 AM

## 2015-07-19 ENCOUNTER — Encounter: Payer: Self-pay | Admitting: Family Medicine

## 2015-07-19 ENCOUNTER — Ambulatory Visit (INDEPENDENT_AMBULATORY_CARE_PROVIDER_SITE_OTHER): Payer: PRIVATE HEALTH INSURANCE | Admitting: Family Medicine

## 2015-07-19 VITALS — BP 125/88 | HR 81 | Temp 98.4°F | Wt 244.0 lb

## 2015-07-19 DIAGNOSIS — N61 Mastitis without abscess: Secondary | ICD-10-CM

## 2015-07-19 DIAGNOSIS — N898 Other specified noninflammatory disorders of vagina: Secondary | ICD-10-CM

## 2015-07-19 DIAGNOSIS — N760 Acute vaginitis: Secondary | ICD-10-CM | POA: Diagnosis not present

## 2015-07-19 DIAGNOSIS — A499 Bacterial infection, unspecified: Secondary | ICD-10-CM | POA: Diagnosis not present

## 2015-07-19 DIAGNOSIS — B9689 Other specified bacterial agents as the cause of diseases classified elsewhere: Secondary | ICD-10-CM

## 2015-07-19 LAB — POCT WET PREP (WET MOUNT): Clue Cells Wet Prep Whiff POC: NEGATIVE

## 2015-07-19 MED ORDER — METRONIDAZOLE 0.75 % VA GEL: 1.0000 | Freq: Two times a day (BID) | VAGINAL | Status: DC

## 2015-07-19 MED ORDER — FLUCONAZOLE 150 MG PO TABS: ORAL_TABLET | ORAL | Status: DC

## 2015-07-19 NOTE — Assessment & Plan Note (Addendum)
Consistent with BV given recurrent h/o, wet prep without clearly identified clue cells or whiff. No yeast identified, but potential to miss yeast.  Plan: 1. Start empiric coverage Metrogel vaginal gel 0.75% BID x 5 days - declined metronidazole (prior ineffective) 2. Counseled on reducing recurrences with avoiding cleansing products/soaps for a trial, may try yogurt, probiotics, will not need condom use as patient has same sex female partner 3. Additionally given rx Diflucan to fill if develops subsequent yeast infection (may start after finish metrogel if still symptoms)

## 2015-07-19 NOTE — Progress Notes (Addendum)
Subjective:    Patient ID: Claudia Wall, female    DOB: 1976-03-25, 40 y.o.   MRN: [id number]  Claudia Wall is a 40 y.o. female presenting on 07/19/2015 for Vaginal irritation   Patient presents for a same day appointment.   HPI  LEFT MASTITIS, Follow-up: - Recently seen at [facility] 07/05/15 for Left breast mastitis, prior history of recurrent breast abscesses, last treated prior to that was 12/2013, she has been on multiple courses of antibiotics including Septra, Keflex. On 07/05/15, she had redness and swelling without draining pus superior medial near nipple at about 10-11 o'clock, she was treated with the same Septra and Keflex x 7 days, used warm compresses and soaks and admits she did have drainage of pus and this is mostly healed now. - Now with vaginal discharge, following antibiotics, see below  VAGINITIS: - Reports vaginal discharge started a few days prior to finishing recent antibiotic courses, about 10 days ago. Tried OTC off brand monostat for 3 days without relief. 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 - No menstrual cycle x 2 months, some irregular history. Denies any chance of pregnancy as she admits that her sexual partner is female. Declines pregnancy test. - Admits to using vaginal cleansing products / soaps usually after every menstrual cycle or at other times - Admits burning on outside when voids (not like prior urinary tract infection) - Denies any fevers/chills, dysuria, frequency, hematuria, abdominal pain   Social History  Substance Use Topics  . Smoking status: Current Every Day Smoker -- 0.50 packs/day    Types: Cigarettes  . Smokeless tobacco: Never Used     Comment: "used to smoke 1/2 to 1ppd depending on stress"  . Alcohol Use: 1.8 oz/week    3 Glasses of wine per week     Comment: occassional    Review of  Systems Per HPI unless specifically indicated above     Objective:    BP 125/88 mmHg  Pulse 81  Temp(Src) 98.4 F (36.9 C) (Oral)  Wt 244 lb (110.678 kg)  LMP 05/16/2015 (Approximate)  Wt Readings from Last 3 Encounters:  07/19/15 244 lb (110.678 kg)  07/02/15 251 lb (113.853 kg)  01/30/15 251 lb 9 oz (114.108 kg)    Physical Exam  Constitutional: She appears well-developed and well-nourished. No distress.  Well-appearing, comfortable, cooperative  Genitourinary: Vaginal discharge found.  Patient deferred repeat pelvic exam today.  Skin: Skin is warm and dry. No rash noted. She is not diaphoretic.  Left breast superior and medial against areola/nipple region 10-11 o'clock with resolving mastitis, no further erythema, some peeling skin from prior swelling, healed over prior opening s/p drainage.  Patient declined chaperone for exam. Note this was visual inspection of skin only.  Nursing note and vitals reviewed.  Results for orders placed or performed in visit on 07/19/15  POCT Wet Prep [facility])  Result Value Ref Range   Source Wet Prep POC VAG    WBC, Wet Prep HPF POC OCCASIONAL    Bacteria Wet Prep HPF POC Moderate (A) None, Few, Too numerous to count   Clue Cells Wet Prep HPF POC None None, Too numerous to count   Clue Cells Wet Prep Whiff POC Negative Whiff    Yeast Wet Prep HPF POC None    Trichomonas Wet Prep HPF POC NONE       Assessment &  Plan:   Problem List Items Addressed This Visit    BV (bacterial vaginosis) - Primary    Consistent with BV given recurrent h/o, wet prep without clearly identified clue cells or whiff. No yeast identified, but potential to miss yeast.  Plan: 1. Start empiric coverage Metrogel vaginal gel 0.75% BID x 5 days - declined metronidazole (prior ineffective) 2. Counseled on reducing recurrences with avoiding cleansing products/soaps for a trial, may try yogurt, probiotics, will not need condom use as patient has same sex female  partner 3. Additionally given rx Diflucan to fill if develops subsequent yeast infection (may start after finish metrogel if still symptoms)       Relevant Medications   fluconazole (DIFLUCAN) 150 MG tablet   metroNIDAZOLE (METROGEL) 0.75 % vaginal gel    Other Visit Diagnoses    Mastitis, left, acute        Resolved, s/p antibiotics keflex/septra 2 weeks ago.       Meds ordered this encounter  Medications  . DISCONTD: metroNIDAZOLE (METROGEL) 0.75 % vaginal gel    Sig: Place 1 Applicatorful vaginally 2 (two) times daily. For 5 days.    Dispense:  70 g    Refill:  1  . fluconazole (DIFLUCAN) 150 MG tablet    Sig: Take one tablet by mouth on Day 1. Repeat dose 2nd tablet on Day 3.    Dispense:  2 tablet    Refill:  1  . metroNIDAZOLE (METROGEL) 0.75 % vaginal gel    Sig: Place 1 Applicatorful vaginally 2 (two) times daily. For 5 days.    Dispense:  70 g    Refill:  1      Follow up plan: Return in about 4 weeks (around 08/16/2015), or if symptoms worsen or fail to improve, for vaginal discharge.  Nobie Putnam, [place], PGY-3

## 2015-07-19 NOTE — Patient Instructions (Signed)
Thank you for coming in to clinic today.  1. For Bacterial Vaginosis (BV) - Test showed ONLY BV - no yeast - Use Metronidazole Vaginal Gel Applicator twice daily for 5 days - Also sent in rx for Diflucan 150mg  take one pill day one then repeat in 2 days, may start this AFTER your Metronidazole course ONLY if you still have vaginal discharge symptoms  This problem can be recurrent. Some people are more likely to get it than others, and it has to do with normal body chemistry and vaginal environment. One of the biggest risk factors for causing BV is unprotected sexual intercourse (without a condom), because semen can change the chemistry of your vaginal environment, causing the "good bacteria" reduce and the other bacteria to grow and cause your symptoms. Recommend to start using condoms with intercourse to see if this helps reduce your symptoms, do this especially for next 1 week while on treatment. Some other recommendations include taking a daily probiotic and yogurt can help reduce BV as well, this is not proven to work in all patients.  Please schedule a follow-up appointment with Dr Lorenso Courier within 4 weeks to follow-up Vaginal Discharge Recurrent  If you have any other questions or concerns, please feel free to call the clinic to contact me. You may also schedule an earlier appointment if necessary.  However, if your symptoms get significantly worse, please go to the Emergency Department to seek immediate medical attention.  Nobie Putnam, [place]

## 2015-11-26 ENCOUNTER — Telehealth: Payer: Self-pay | Admitting: *Deleted

## 2015-11-26 NOTE — Telephone Encounter (Signed)
Patient calling requesting refill on hctz. [place]

## 2015-11-27 MED ORDER — HYDROCHLOROTHIAZIDE 25 MG PO TABS: 25.0000 mg | ORAL_TABLET | Freq: Every day | ORAL | 0 refills | Status: DC

## 2015-11-27 NOTE — Telephone Encounter (Signed)
Refill of HCTZ sent in.  Thanks, Archie Patten, MD [facility] Resident  11/27/2015, 7:37 AM

## 2015-11-28 ENCOUNTER — Other Ambulatory Visit: Payer: Self-pay | Admitting: Family Medicine

## 2015-11-28 MED ORDER — HYDROCHLOROTHIAZIDE 25 MG PO TABS: 25.0000 mg | ORAL_TABLET | Freq: Every day | ORAL | 0 refills | Status: DC

## 2015-11-28 NOTE — Telephone Encounter (Signed)
Needs refill on HCTZ.  [place]

## 2016-01-07 ENCOUNTER — Ambulatory Visit (INDEPENDENT_AMBULATORY_CARE_PROVIDER_SITE_OTHER): Payer: PRIVATE HEALTH INSURANCE | Admitting: Family Medicine

## 2016-01-07 ENCOUNTER — Encounter: Payer: Self-pay | Admitting: Family Medicine

## 2016-01-07 DIAGNOSIS — M62838 Other muscle spasm: Secondary | ICD-10-CM | POA: Diagnosis not present

## 2016-01-07 MED ORDER — CYCLOBENZAPRINE HCL 10 MG PO TABS: 10.0000 mg | ORAL_TABLET | Freq: Three times a day (TID) | ORAL | 0 refills | Status: DC | PRN

## 2016-01-07 MED ORDER — NAPROXEN 500 MG PO TABS: 500.0000 mg | ORAL_TABLET | Freq: Two times a day (BID) | ORAL | 0 refills | Status: DC

## 2016-01-07 NOTE — Progress Notes (Signed)
   HPI  CC:  Woke up Saturday with pain in shoulder.  Took ibuprofen helped some. Rt shoulder and back No activity Friday or Saturday. No weakness. Just sore and "cramping". Hard to turn head.  Pain does not radiate. No significant improvement since symptom onset. No history of injury or trauma. No numbness or paresthesias. Nothing has made it better other than the ibuprofen; movement and palpation of the areas of pain exacerbate the pain.  Review of Systems   See HPI for ROS. All other systems reviewed and are negative.  CC, SH/smoking status, and VS noted  Objective: BP (!) 165/97   Pulse 71   Temp 98.8 F (37.1 C) (Oral)   Ht 5\' 3"  (1.6 m)   Wt 257 lb 6.4 oz (116.8 kg)   LMP 01/02/2016   BMI 45.60 kg/m  Gen: NAD, alert, cooperative. CV: Well-perfused. Resp: Non-labored. Neuro: Sensation intact throughout. Shoulder, Right: No evidence of injury or trauma. No swelling or erythema. Significant TTP along the right scalene and trapezius muscles. Pain is exacerbated with extending neck and shrugging. Some muscle body fullness was present on palpation over the areas of pain when compared to the left side. Shoulder has full range of motion in all 4 tendons of the rotator cuff appear intact without significant tenderness.   Assessment and plan:  Muscle spasm of right shoulder Patient is here with complaints of upper back and right shoulder pain most consistent with muscle spasm of the right scalene and trapezius muscles. No red flag symptoms. No radiation of pain. No numbness, weakness, or paresthesias. - Naproxen twice a day 5 days then twice a day when necessary after that - Flexeril 3 times a day when necessary - Rest, heat/ice as needed and tolerated   Meds ordered this encounter  Medications  . cyclobenzaprine (FLEXERIL) 10 MG tablet    Sig: Take 1 tablet (10 mg total) by mouth 3 (three) times daily as needed for muscle spasms.    Dispense:  21 tablet    Refill:  0    . naproxen (NAPROSYN) 500 MG tablet    Sig: Take 1 tablet (500 mg total) by mouth 2 (two) times daily with a meal.    Dispense:  60 tablet    Refill:  0     Elberta Leatherwood, MD,MS,  PGY3 01/07/2016 6:25 PM

## 2016-01-07 NOTE — Assessment & Plan Note (Addendum)
Patient is here with complaints of upper back and right shoulder pain most consistent with muscle spasm of the right scalene and trapezius muscles. No red flag symptoms. No radiation of pain. No numbness, weakness, or paresthesias. - Naproxen twice a day 5 days then twice a day when necessary after that - Flexeril 3 times a day when necessary - Rest, heat/ice as needed and tolerated

## 2016-01-07 NOTE — Patient Instructions (Signed)
It was a pleasure seeing you today in our clinic. Today we discussed your upper back pain. Here is the treatment plan we have discussed and agreed upon together:   - I believe you have muscle spasms in your scalene and trapezius muscles. - I prescribed you naproxen. Take 1 tablet twice a day with food for the first 5 days. Then take 1 tablet twice a day with food as needed after that. - I prescribed you Flexeril. Take 1 tablet 3 times a day as needed for muscle spasm pain. This medication will benefit you most if taken before bed. One major side effect of this medication as drowsiness. Do not drive her car after taking this medication. - If you begin to experience weakness in your hands or shoulder, or bowel/bladder incontinence, or gait changes do not hesitate to come back for reevaluation or report to the nearest emergency department.

## 2016-01-10 ENCOUNTER — Encounter (HOSPITAL_COMMUNITY): Payer: Self-pay | Admitting: Emergency Medicine

## 2016-01-10 ENCOUNTER — Ambulatory Visit (HOSPITAL_COMMUNITY)
Admission: EM | Admit: 2016-01-10 | Discharge: 2016-01-10 | Disposition: A | Discharge status: Home / Self Care | Payer: PRIVATE HEALTH INSURANCE | Attending: Internal Medicine | Admitting: Internal Medicine

## 2016-01-10 DIAGNOSIS — N61 Mastitis without abscess: Secondary | ICD-10-CM | POA: Diagnosis not present

## 2016-01-10 MED ORDER — HYDROCODONE-ACETAMINOPHEN 5-325 MG PO TABS
ORAL_TABLET | ORAL | Status: AC
  Filled 2016-01-10: qty 1

## 2016-01-10 MED ORDER — CLINDAMYCIN HCL 300 MG PO CAPS: 300.0000 mg | ORAL_CAPSULE | Freq: Three times a day (TID) | ORAL | 0 refills | Status: DC

## 2016-01-10 MED ORDER — HYDROCODONE-ACETAMINOPHEN 5-325 MG PO TABS: 2.0000 | ORAL_TABLET | ORAL | 0 refills | Status: DC | PRN

## 2016-01-10 MED ORDER — HYDROCODONE-ACETAMINOPHEN 5-325 MG PO TABS
1.0000 | ORAL_TABLET | Freq: Once | ORAL | Status: AC
  Administered 2016-01-10: 1 via ORAL

## 2016-01-10 NOTE — ED Provider Notes (Signed)
CSN: [id number]     Arrival date & time 01/10/16  1820 History   None    Chief Complaint  Patient presents with  . Abscess   (Consider location/radiation/quality/duration/timing/severity/associated sxs/prior Treatment) Patien c/o pain in left breast and she is in severe pain.  She has hx of mastitis   The history is provided by the patient.  Abscess  Abscess quality: redness   Red streaking: no   Duration:  2 days Progression:  Worsening Chronicity:  New Relieved by:  Nothing Worsened by:  Nothing Ineffective treatments:  None tried Associated symptoms: anorexia     Past Medical History:  Diagnosis Date  . Breast abscess   . HTN (hypertension)   . Irregular periods/menstrual cycles   . Mastitis   . Obesity   . Obesity   . Tobacco abuse   . Tobacco abuse    Past Surgical History:  Procedure Laterality Date  . CHOLECYSTECTOMY  2002  . INCISION AND DRAINAGE BREAST ABSCESS     Family History  Problem Relation Age of Onset  . Cancer Mother   . Hypertension Mother   . Hypertension Father    Social History  Substance Use Topics  . Smoking status: Current Every Day Smoker    Packs/day: 0.50    Types: Cigarettes  . Smokeless tobacco: Never Used     Comment: "used to smoke 1/2 to 1ppd depending on stress"  . Alcohol use 1.8 oz/week    3 Glasses of wine per week     Comment: occassional   OB History    No data available     Review of Systems  Constitutional: Negative.   HENT: Negative.   Eyes: Negative.   Respiratory: Negative.   Cardiovascular: Negative.   Gastrointestinal: Positive for anorexia.  Endocrine: Negative.   Genitourinary: Negative.   Musculoskeletal: Negative.   Allergic/Immunologic: Negative.   Neurological: Negative.   Hematological: Negative.   Psychiatric/Behavioral: Negative.     Allergies  Review of patient's allergies indicates no known allergies.  Home Medications   Prior to Admission medications   Medication Sig Start  Date End Date Taking? Authorizing Provider  clindamycin (CLEOCIN) 300 MG capsule Take 1 capsule (300 mg total) by mouth 3 (three) times daily. 01/10/16   Lysbeth Penner, FNP  cyclobenzaprine (FLEXERIL) 10 MG tablet Take 1 tablet (10 mg total) by mouth 3 (three) times daily as needed for muscle spasms. 01/07/16   Elberta Leatherwood, MD  fluconazole (DIFLUCAN) 150 MG tablet Take one tablet by mouth on Day 1. Repeat dose 2nd tablet on Day 3. 07/19/15   Olin Hauser, DO  hydrochlorothiazide (HYDRODIURIL) 25 MG tablet Take 1 tablet (25 mg total) by mouth daily. 11/28/15   Archie Patten, MD  HYDROcodone-acetaminophen (NORCO) 5-325 MG tablet Take 1 tablet by mouth every 6 (six) hours as needed for moderate pain. 07/05/15   Melony Overly, MD  HYDROcodone-acetaminophen (NORCO/VICODIN) 5-325 MG tablet Take 2 tablets by mouth every 4 (four) hours as needed. 01/10/16   Lysbeth Penner, FNP  metroNIDAZOLE (METROGEL) 0.75 % vaginal gel Place 1 Applicatorful vaginally 2 (two) times daily. For 5 days. 07/19/15   Olin Hauser, DO  naproxen (NAPROSYN) 500 MG tablet Take 1 tablet (500 mg total) by mouth 2 (two) times daily with a meal. 01/07/16   Elberta Leatherwood, MD   Meds Ordered and Administered this Visit  Medications - No data to display  BP 145/97 (BP Location: Right Arm)  Pulse 80   Temp 98.2 F (36.8 C) (Oral)   Resp 18   LMP 01/02/2016   SpO2 100%  No data found.   Physical Exam  Constitutional: She appears well-developed and well-nourished.  HENT:  Head: Normocephalic and atraumatic.  Right Ear: External ear normal.  Left Ear: External ear normal.  Mouth/Throat: Oropharynx is clear and moist.  Eyes: Conjunctivae and EOM are normal. Pupils are equal, round, and reactive to light.  Neck: Normal range of motion. Neck supple.  Cardiovascular: Normal rate, regular rhythm and normal heart sounds.   Pulmonary/Chest: Effort normal and breath sounds normal.  Abdominal: Soft. Bowel sounds  are normal.  Musculoskeletal: Normal range of motion.  Skin:  Left breast warm and erythematous approx 3 cm area left breast and no abscess palpated.  Nursing note and vitals reviewed.   Urgent Care Course   Clinical Course    Procedures (including critical care time)  Labs Review Labs Reviewed - No data to display  Imaging Review No results found.   Visual Acuity Review  Right Eye Distance:   Left Eye Distance:   Bilateral Distance:    Right Eye Near:   Left Eye Near:    Bilateral Near:         MDM   1. Mastitis, left, acute    Clindamycin 300mg  po tid x 10 days #30 Norco 5/325 one po q 6 hours prn #6      Lysbeth Penner, FNP 01/10/16 1936

## 2016-01-10 NOTE — ED Notes (Signed)
Patient requested pain medicine now.  Patient has a driver.  Spoke to bill oxford to get order

## 2016-01-10 NOTE — ED Triage Notes (Signed)
Patient reports a history of mastitis.  Reports having surgery "to remove the duct" and patient also says she has had an abscess develop before, but she is not going to let it get that bad.

## 2016-01-10 NOTE — ED Notes (Signed)
At bedside for examination of patient

## 2016-01-15 ENCOUNTER — Encounter (HOSPITAL_COMMUNITY): Payer: Self-pay | Admitting: *Deleted

## 2016-01-15 ENCOUNTER — Emergency Department (HOSPITAL_COMMUNITY)
Admission: EM | Admit: 2016-01-15 | Discharge: 2016-01-15 | Disposition: A | Discharge status: Home / Self Care | Payer: PRIVATE HEALTH INSURANCE | Attending: Emergency Medicine | Admitting: Emergency Medicine

## 2016-01-15 DIAGNOSIS — Z79899 Other long term (current) drug therapy: Secondary | ICD-10-CM | POA: Diagnosis not present

## 2016-01-15 DIAGNOSIS — I10 Essential (primary) hypertension: Secondary | ICD-10-CM | POA: Diagnosis not present

## 2016-01-15 DIAGNOSIS — N644 Mastodynia: Secondary | ICD-10-CM | POA: Diagnosis present

## 2016-01-15 DIAGNOSIS — F17210 Nicotine dependence, cigarettes, uncomplicated: Secondary | ICD-10-CM | POA: Diagnosis not present

## 2016-01-15 DIAGNOSIS — N611 Abscess of the breast and nipple: Secondary | ICD-10-CM

## 2016-01-15 MED ORDER — VANCOMYCIN HCL 10 G IV SOLR
1250.0000 mg | Freq: Once | INTRAVENOUS | Status: AC
  Administered 2016-01-15: 1250 mg via INTRAVENOUS
  Filled 2016-01-15: qty 1250

## 2016-01-15 MED ORDER — HYDROCODONE-ACETAMINOPHEN 5-325 MG PO TABS: 2.0000 | ORAL_TABLET | ORAL | 0 refills | Status: DC | PRN

## 2016-01-15 MED ORDER — MORPHINE SULFATE (PF) 4 MG/ML IV SOLN
4.0000 mg | INTRAVENOUS | Status: AC | PRN
  Administered 2016-01-15 (×3): 4 mg via INTRAVENOUS
  Filled 2016-01-15 (×3): qty 1

## 2016-01-15 MED ORDER — ONDANSETRON HCL 4 MG/2ML IJ SOLN
4.0000 mg | Freq: Once | INTRAMUSCULAR | Status: AC
Start: 2016-01-15 — End: 2016-01-15
  Administered 2016-01-15: 4 mg via INTRAVENOUS
  Filled 2016-01-15: qty 2

## 2016-01-15 MED ORDER — CLINDAMYCIN HCL 150 MG PO CAPS: 150.0000 mg | ORAL_CAPSULE | Freq: Three times a day (TID) | ORAL | 0 refills | Status: DC

## 2016-01-15 NOTE — ED Triage Notes (Signed)
Patient c/o left breast pain and swelling.  She was seen at [facility] UC and dx with mastitis 5 days ago.  Patient was prescribed Clindamyin and Norco at [facility] on 10/12.  She states Norco is not managing pain and she is not getting better.  Patient denies drainage from nipple, and v/d, but endorses fever, high of 103 and nausea.  She also endorses anorexia.    Patient has hx of mastitis and had a duct removed "years ago."

## 2016-01-15 NOTE — Discharge Instructions (Signed)
Continue your current prescription, and fill new prescription to complete 10 days of clindamycin.  Take an over-the-counter probiotic twice per day to prevent diarrhea from your clindamycin.  Discus this with the pharmacist.  Vicodin for pain.  Change dressing as needed if soaked with drainage, or blood.  In 48 hours soaked, and remove the gauze from the wound. Then continue soaks with gentle massage twice per day to encourage additional drainage.  If not resolving in 5-10 days, or before completion of antibiotics, called the phone number on the chart above to make an appointment with [facility] [place] [facility] to discussed formal surgical procedure to drain abscess.

## 2016-01-15 NOTE — ED Provider Notes (Signed)
[facility] DEPT Provider Note   CSN: [id number] Arrival date & time: 01/15/16  0757     History   Chief Complaint Chief Complaint  Patient presents with  . Breast Pain    HPI Claudia Wall is a 39 y.o. female.  She presents for evaluation of redness and pain in her left breast for the last 5-6 days. She was seen and evaluated on the 12th at urgent care with a red swollen breast. She has a history of previous mastitis. Also has a history of a previous abscess requiring surgical incision and drainage a few years ago. Was placed on clindamycin 5 days go to urgent care. Exam at that time per her latter did not suggest abscess. Her symptoms have worsened with progressive redness of the left breast and focal pain just above and to the right of the left nipple.  HPI  Past Medical History:  Diagnosis Date  . Breast abscess   . HTN (hypertension)   . Irregular periods/menstrual cycles   . Mastitis   . Obesity   . Obesity   . Tobacco abuse   . Tobacco abuse     Patient Active Problem List   Diagnosis Date Noted  . Muscle spasm of right shoulder 01/07/2016  . BV (bacterial vaginosis) 03/08/2014  . Tingling in extremities 02/15/2014  . Carpal tunnel syndrome 07/07/2013  . Breast pain, left 05/12/2012  . Palpitations 10/27/2011  . Alcohol consumption binge drinking 10/27/2011  . DYSHIDROTIC ECZEMA 11/06/2008  . Moderate cigarette smoker (10-19 per day) 11/01/2008  . OBESITY 02/03/2007  . ANXIETY 05/28/2006  . HYPERTENSION, BENIGN SYSTEMIC 05/28/2006  . GASTROESOPHAGEAL REFLUX, NO ESOPHAGITIS 05/28/2006    Past Surgical History:  Procedure Laterality Date  . CHOLECYSTECTOMY  2002  . INCISION AND DRAINAGE BREAST ABSCESS      OB History    No data available       Home Medications    Prior to Admission medications   Medication Sig Start Date End Date Taking? Authorizing Provider  hydrochlorothiazide (HYDRODIURIL) 25 MG tablet Take 1 tablet (25 mg total) by mouth  daily. Patient taking differently: Take 25 mg by mouth at bedtime.  11/28/15  Yes Archie Patten, MD  ibuprofen (ADVIL,MOTRIN) 200 MG tablet Take 600 mg by mouth every 6 (six) hours as needed for headache, mild pain or moderate pain.   Yes Historical Provider, MD  clindamycin (CLEOCIN) 150 MG capsule Take 1 capsule (150 mg total) by mouth 3 (three) times daily. 01/15/16   Tanna Furry, MD  HYDROcodone-acetaminophen (NORCO/VICODIN) 5-325 MG tablet Take 2 tablets by mouth every 4 (four) hours as needed. 01/15/16   Tanna Furry, MD    Family History Family History  Problem Relation Age of Onset  . Cancer Mother   . Hypertension Mother   . Hypertension Father     Social History Social History  Substance Use Topics  . Smoking status: Current Some Day Smoker    Packs/day: 0.50    Types: Cigarettes  . Smokeless tobacco: Never Used     Comment: "used to smoke 1/2 to 1ppd depending on stress"  . Alcohol use 1.8 oz/week    3 Glasses of wine per week     Comment: occassional     Allergies   Review of patient's allergies indicates no known allergies.   Review of Systems Review of Systems  Constitutional: Negative for appetite change, chills, diaphoresis, fatigue and fever.  HENT: Negative for mouth sores, sore throat and trouble  swallowing.   Eyes: Negative for visual disturbance.  Respiratory: Negative for cough, chest tightness, shortness of breath and wheezing.   Cardiovascular: Negative for chest pain.  Gastrointestinal: Negative for abdominal distention, abdominal pain, diarrhea, nausea and vomiting.  Endocrine: Negative for polydipsia, polyphagia and polyuria.  Genitourinary: Negative for dysuria, frequency and hematuria.  Musculoskeletal: Negative for gait problem.  Skin: Negative for color change, pallor and rash.       Redness pain and swelling of the left breast. No nipple discharge.  Neurological: Negative for dizziness, syncope, light-headedness and headaches.    Hematological: Does not bruise/bleed easily.  Psychiatric/Behavioral: Negative for behavioral problems and confusion.     Physical Exam Updated Vital Signs BP 126/83   Pulse 94   Temp 98 F (36.7 C) (Oral)   Resp 18   Ht 5\' 3"  (1.6 m)   Wt 257 lb (116.6 kg)   LMP 01/02/2016   SpO2 100%   BMI 45.53 kg/m   Physical Exam  Constitutional: She is oriented to person, place, and time. She appears well-developed and well-nourished. No distress.  HENT:  Head: Normocephalic.  Eyes: Conjunctivae are normal. Pupils are equal, round, and reactive to light. No scleral icterus.  Neck: Normal range of motion. Neck supple. No thyromegaly present.  Cardiovascular: Normal rate and regular rhythm.  Exam reveals no gallop and no friction rub.   No murmur heard. Pulmonary/Chest: Effort normal and breath sounds normal. No respiratory distress. She has no wheezes. She has no rales.    Abdominal: Soft. Bowel sounds are normal. She exhibits no distension. There is no tenderness. There is no rebound.  Musculoskeletal: Normal range of motion.  Neurological: She is alert and oriented to person, place, and time.  Skin: Skin is warm and dry. No rash noted.  Psychiatric: She has a normal mood and affect. Her behavior is normal.     ED Treatments / Results  Labs (all labs ordered are listed, but only abnormal results are displayed) Labs Reviewed - No data to display  EKG  EKG Interpretation None       Radiology No results found.  Procedures Procedures (including critical care time)  Medications Ordered in ED Medications  morphine 4 MG/ML injection 4 mg (4 mg Intravenous Given 01/15/16 1239)  ondansetron (ZOFRAN) injection 4 mg (4 mg Intravenous Given 01/15/16 1042)  vancomycin (VANCOCIN) 1,250 mg in sodium chloride 0.9 % 250 mL IVPB (0 mg Intravenous Stopped 01/15/16 1309)     Initial Impression / Assessment and Plan / ED Course  I have reviewed the triage vital signs and the  nursing notes.  Pertinent labs & imaging results that were available during my care of the patient were reviewed by me and considered in my medical decision making (see chart for details).  Clinical Course    IV was placed. Given IV vancomycin. Given IV morphine and Zofran for pain and nausea. We discussed incision and drainage and sig is consent for this. The ultrasound this abscess is immediately subcutaneous and superficial to obvious demarcated breast tissue.  INCISION AND DRAINAGE Performed by: Lolita Patella Consent: Verbal consent obtained. Risks and benefits: risks, benefits and alternatives were discussed Type: abscess  Body area: Left breast  Anesthesia: local infiltration  Incision was made with a scalpel.  Local anesthetic: lidocaine2% c epinephrine  Anesthetic total: 4 ml  Complexity: complex Blunt dissection to break up loculations  Drainage: purulent  Drainage amount: moderate 5-10cc purulent, thick, irrigated with Ns until clear effluent. Then  probed with a cotton tip applicator and we irrigated until again clear effluent   Packing material: 1/4 in iodoform gauze  Patient tolerance: Patient tolerated the procedure well with no immediate complications.    Final Clinical Impressions(s) / ED Diagnoses   Final diagnoses:  Breast abscess    New Prescriptions New Prescriptions   CLINDAMYCIN (CLEOCIN) 150 MG CAPSULE    Take 1 capsule (150 mg total) by mouth 3 (three) times daily.   HYDROCODONE-ACETAMINOPHEN (NORCO/VICODIN) 5-325 MG TABLET    Take 2 tablets by mouth every 4 (four) hours as needed.     Tanna Furry, MD 01/18/16 1346

## 2016-01-17 ENCOUNTER — Other Ambulatory Visit: Payer: Self-pay | Admitting: Family Medicine

## 2016-01-17 MED ORDER — HYDROCODONE-ACETAMINOPHEN 5-325 MG PO TABS: 2.0000 | ORAL_TABLET | ORAL | 0 refills | Status: DC | PRN

## 2016-01-17 NOTE — Telephone Encounter (Signed)
Refill pain medication for 5 pills.  She should be taking Ibuprofen or Tylenol for pain in addition to warm compress prior to taking narcotics. She can pick up the Rx at the front office.  Thanks, Archie Patten, MD [facility] Family Medicine Resident  01/17/2016, 1:39 PM

## 2016-01-17 NOTE — Telephone Encounter (Signed)
Pt has breast pain, was seen in ER and given pain medicine. Pt only has one left. I scheduled a f/u appointment for tomorrow (01-18-16), but pt would like to know if it was possible to get a couple pain pills to get her through until appointment tomorrow. Please advise. Thanks! ep

## 2016-01-17 NOTE — Telephone Encounter (Signed)
Patient informed, expressed understanding. 

## 2016-01-18 ENCOUNTER — Encounter: Payer: Self-pay | Admitting: Family Medicine

## 2016-01-18 ENCOUNTER — Ambulatory Visit (INDEPENDENT_AMBULATORY_CARE_PROVIDER_SITE_OTHER): Payer: PRIVATE HEALTH INSURANCE | Admitting: Family Medicine

## 2016-01-18 DIAGNOSIS — N644 Mastodynia: Secondary | ICD-10-CM

## 2016-01-18 NOTE — Patient Instructions (Signed)
It was a pleasure to meet you today. Please see below to review our plan for today's visit.  1. Please finish the clindamycin antibiotic. If redness gets worse or your develop a fever. Please seek medical care. 2. Take ibuprofen for pain and tylenol interchangeably. You can take up to 800 mg four times per day. Use Norco only for severe pain if needed. 3. You need an up to date mammogram. I would recommend you calling and scheduling one in 2 months after this resolves.  Please call the clinic at [phone] if your symptoms worsen or you have any concerns. It was my pleasure to see you. -- Harriet Butte, [place], PGY-1   Mastitis Mastitis is inflammation of the breast tissue. It occurs most often in women who are breastfeeding, but it can also affect other women, and even sometimes men. CAUSES  Mastitis is usually caused by a bacterial infection. Bacteria enter the breast tissue through cuts or openings in the skin. Typically, this occurs with breastfeeding because of cracked or irritated skin. Sometimes, it can occur even when there is no opening in the skin. It can be associated with plugged milk (lactiferous) ducts. Nipple piercing can also lead to mastitis. Also, some forms of breast cancer can cause mastitis. SIGNS AND SYMPTOMS   Swelling, redness, tenderness, and pain in an area of the breast.  Swelling of the glands under the arm on the same side.  Fever. If an infection is allowed to progress, a collection of pus (abscess) may develop. DIAGNOSIS  Your health care provider can usually diagnose mastitis based on your symptoms and a physical exam. Tests may be done to help confirm the diagnosis. These may include:   Removal of pus from the breast by applying pressure to the area. This pus can be examined in the lab to determine which bacteria are present. If an abscess has developed, the fluid in the abscess can be removed with a needle. This can also be  used to confirm the diagnosis and determine the bacteria present. In most cases, pus will not be present.  Blood tests to determine if your body is fighting a bacterial infection.  Mammogram or ultrasound tests to rule out other problems or diseases. TREATMENT  Antibiotic medicine is used to treat a bacterial infection. Your health care provider will determine which bacteria are most likely causing the infection and will select an appropriate antibiotic. This is sometimes changed based on the results of tests performed to identify the bacteria, or if there is no response to the antibiotic selected. Antibiotics are usually given by mouth. You may also be given medicine for pain. Mastitis that occurs with breastfeeding will sometimes go away on its own, so your health care provider may choose to wait 24 hours after first seeing you to decide whether a prescription medicine is needed. HOME CARE INSTRUCTIONS   Only take over-the-counter or prescription medicines for pain, fever, or discomfort as directed by your health care provider.  If your health care provider prescribed an antibiotic, take the medicine as directed. Make sure you finish it even if you start to feel better.  Do not wear a tight or underwire bra. Wear a soft, supportive bra.  Increase your fluid intake, especially if you have a fever.  Women who are breastfeeding should follow these instructions:  Continue to empty the breast. Your health care provider can tell you whether this milk is safe for your infant or needs to be  thrown out. You may be told to stop nursing until your health care provider thinks it is safe for your baby. Use a breast pump if you are advised to stop nursing.  Keep your nipples clean and dry.  Empty the first breast completely before going to the other breast. If your baby is not emptying your breasts completely for some reason, use a breast pump to empty your breasts.  If you go back to work, pump your  breasts while at work to stay in time with your nursing schedule.  Avoid allowing your breasts to become overly filled with milk (engorged). SEEK MEDICAL CARE IF:   You have pus-like discharge from the breast.  Your symptoms do not improve with the treatment prescribed by your health care provider within 2 days. SEEK IMMEDIATE MEDICAL CARE IF:   Your pain and swelling are getting worse.  You have pain that is not controlled with medicine.  You have a red line extending from the breast toward your armpit.  You have a fever or persistent symptoms for more than 2-3 days.  You have a fever and your symptoms suddenly get worse.   This information is not intended to replace advice given to you by your health care provider. Make sure you discuss any questions you have with your health care provider.   Document Released: 03/17/2005 Document Revised: 03/22/2013 Document Reviewed: 10/15/2012 Elsevier Interactive Patient Education [employer].

## 2016-01-18 NOTE — Assessment & Plan Note (Addendum)
Improving. Mastitis with abscess drained. No erythema or drainage appreciated. Patient seems to be improving with clindamycin. Pain is well controlled. - Finish Clindamycin 150 mg TID - Use tylenol and ibuprofen for pain and Norco for severe pain PRN - Patient told to keep site dry - Dry dressings  - Due for mammogram (last 2014), rec patient get one in 2 months when breast is healed

## 2016-01-18 NOTE — Progress Notes (Signed)
   Subjective:   Patient ID: Claudia Wall    DOB: 11-12-1975, 40 y.o. female   MRN: [id number]  CC: Left Breast Pain  HPI: Claudia Wall is a 40 y.o. female who presents to clinic today left breast pain. Problems discussed today are as follows:  Left Breast Pain: onset 10/10 with left breast tenderness. Denies h/o trauma. Became erythematous and was seen at urgent care on 10/12 and d/c on Clindamycin and Norco for mastitis but without relief. Erythema worsened and patient experienced fever up to 103F with nausea. Returned to [facility] ED on 10/17 and was treated with U/S guided I&D with successful drainage of abscess. Given IV vancomycin and morphine. D/c on Clindamycin and Norco again. Here today for f/u which patient notes much improvement of both pain and redness. Pain is minimal and located only at incision site. Drainage has been decreasing daily and is minimal today with clear to white discharge. Denies fevers, chills, nausea, chest pain, dypnea, abdominal pain, fatigue, nipple drainage.  ROS: See HPI for pertinent ROS.  [place]: Pertinent past medical, surgical, family, and social history were reviewed and updated as appropriate. Smoking status reviewed.  Medications reviewed. Current Outpatient Prescriptions  Medication Sig Dispense Refill  . clindamycin (CLEOCIN) 150 MG capsule Take 1 capsule (150 mg total) by mouth 3 (three) times daily. 15 capsule 0  . hydrochlorothiazide (HYDRODIURIL) 25 MG tablet Take 1 tablet (25 mg total) by mouth daily. (Patient taking differently: Take 25 mg by mouth at bedtime. ) 90 tablet 0  . HYDROcodone-acetaminophen (NORCO/VICODIN) 5-325 MG tablet Take 2 tablets by mouth every 4 (four) hours as needed. 5 tablet 0  . ibuprofen (ADVIL,MOTRIN) 200 MG tablet Take 600 mg by mouth every 6 (six) hours as needed for headache, mild pain or moderate pain.     No current facility-administered medications for this visit.     Objective:   BP (!) 143/100   Pulse 91    Temp 98.1 F (36.7 C) (Oral)   Wt 250 lb 6.4 oz (113.6 kg)   LMP 01/02/2016   BMI 44.36 kg/m  Vitals and nursing note reviewed.  General: well nourished, well developed, in no acute distress with non-toxic appearance HEENT: normocephalic, atraumatic, moist mucous membranes Neck: supple, non-tender without lymphadenopathy CV: regular rate and rhythm without murmurs, rubs, or gallops Lungs: clear to auscultation bilaterally with normal work of breathing Abdomen: soft, non-tender, non-distended, no masses or organomegaly palpable, normoactive bowel sounds GU: accompanied by chaperone, right breast unremarkable, left breast with dry dressing, small 5 mm incision site superior to nipple with no drainage or hemorrhaging, minimal tenderness at site with no tracking or crepitus Skin: warm, dry, no rashes or lesions, cap refill < 2 seconds Extremities: warm and well perfused, normal tone  Assessment & Plan:   Breast pain, left Improving. Mastitis with abscess drained. No erythema or drainage appreciated. Patient seems to be improving with clindamycin. Pain is well controlled. - Finish Clindamycin 150 mg TID - Use tylenol and ibuprofen for pain and Norco for severe pain PRN - Patient told to keep site dry - Dry dressings  - Due for mammogram (last 2014), rec patient get one in 2 months when breast is healed   No orders of the defined types were placed in this encounter.  No orders of the defined types were placed in this encounter.   Harriet Butte, [place], PGY-1 01/18/2016 5:10 PM

## 2016-01-31 ENCOUNTER — Ambulatory Visit (INDEPENDENT_AMBULATORY_CARE_PROVIDER_SITE_OTHER): Payer: PRIVATE HEALTH INSURANCE | Admitting: Family Medicine

## 2016-01-31 ENCOUNTER — Encounter: Payer: Self-pay | Admitting: Family Medicine

## 2016-01-31 VITALS — BP 131/69 | HR 81 | Temp 98.8°F | Ht 63.0 in | Wt 252.8 lb

## 2016-01-31 DIAGNOSIS — N61 Mastitis without abscess: Secondary | ICD-10-CM | POA: Diagnosis not present

## 2016-01-31 MED ORDER — HYDROCODONE-ACETAMINOPHEN 5-325 MG PO TABS: 2.0000 | ORAL_TABLET | ORAL | 0 refills | Status: DC | PRN

## 2016-01-31 MED ORDER — CEFTRIAXONE SODIUM 1 G IJ SOLR
1.0000 g | Freq: Once | INTRAMUSCULAR | Status: AC
  Administered 2016-01-31: 1 g via INTRAMUSCULAR

## 2016-01-31 MED ORDER — SULFAMETHOXAZOLE-TRIMETHOPRIM 800-160 MG PO TABS: 2.0000 | ORAL_TABLET | Freq: Two times a day (BID) | ORAL | 0 refills | Status: DC

## 2016-01-31 NOTE — Patient Instructions (Signed)
We will give you a shot of antibiotics today.  Please start the bactrim today.  We will refer you to a surgeon in case it is not getting better.  Come back for a follow up within the next week.  Take care,  Dr Jerline Pain

## 2016-01-31 NOTE — Assessment & Plan Note (Addendum)
Unforunately no gram stain or culture from previous I&D. No obvious areas of abscess on bedside US. Apparently patient had similar episode in the past which improved with bactrim. Will start a 10 day course of bactrim today. Will also give 1G rocephin while in clinic (to cover possible MSSA). Will give norco for pain. Instructed patient to follow up within the next week. Strict return precautions reviewed. Give history of recurrent abscesses, will refer to surgery.

## 2016-01-31 NOTE — Progress Notes (Addendum)
    Subjective:  Claudia Wall is a 40 y.o. female who presents to the [facility] today with a chief complaint of left breast abscess.   HPI:  Left Breast Abscess Patient now with several week history of left breast mastitis and abscess. She was seen in the ED two weeks ago and had an I&D performed. No culture or gram stain was sent. She was placed on clindamycin which she does not think helped significantly with the symptoms. She was seen in clinic 10 days ago for follow up mastitis which seemed to be improving at that time. She was instructed to finish her course of clindamycin.  Since finishing her course, she has noticed an increase in redness and pain at the site over the last couple of days. Pain is most significant just above her left nipple.  No fevers or chills. No drainage from the area.   ROS: Per HPI  PMH: Smoking history reviewed.    Objective:  Physical Exam: BP 131/69 (BP Location: Left Arm, Patient Position: Sitting, Cuff Size: Large)   Pulse 81   Temp 98.8 F (37.1 C) (Oral)   Ht 5\' 3"  (1.6 m)   Wt 252 lb 12.8 oz (114.7 kg)   LMP 01/02/2016 (Exact Date)   SpO2 100%   BMI 44.78 kg/m   Gen: NAD, resting comfortably Pulm: NWOB Breast: Left breast erythematous across approximately 15cm area around nipple with underlying 4cm diameter indurated area. Exquisitely tender to palpation. No drainage.  MSK: no edema, cyanosis, or clubbing noted Skin: warm, dry Neuro: grossly normal, moves all extremities Psych: Normal affect and thought content  Bedside US: No apparent areas of abscess in left breast.   Assessment/Plan:  Cellulitis of left breast Unforunately no gram stain or culture from previous I&D. No obvious areas of abscess on bedside US. Apparently patient had similar episode in the past which improved with bactrim. Will start a 10 day course of bactrim today. Will also give 1G rocephin while in clinic (to cover possible MSSA). Will give norco for pain. Instructed  patient to follow up within the next week. Strict return precautions reviewed. Give history of recurrent abscesses, will refer to surgery.    Algis Greenhouse. Jerline Pain, [place] Resident PGY-3 02/01/2016 8:45 AM

## 2016-02-29 ENCOUNTER — Encounter: Payer: Self-pay | Admitting: Family Medicine

## 2016-02-29 ENCOUNTER — Ambulatory Visit (INDEPENDENT_AMBULATORY_CARE_PROVIDER_SITE_OTHER): Payer: PRIVATE HEALTH INSURANCE | Admitting: Family Medicine

## 2016-02-29 DIAGNOSIS — M545 Low back pain: Secondary | ICD-10-CM

## 2016-02-29 MED ORDER — MELOXICAM 15 MG PO TABS: 15.0000 mg | ORAL_TABLET | Freq: Every day | ORAL | 0 refills | Status: DC

## 2016-02-29 NOTE — Progress Notes (Signed)
   Subjective:    Patient ID: Claudia Wall , female   DOB: 1975-06-24 , 40 y.o..   MRN: [id number]  HPI  Claudia Wall is here for back pain . BACK PAIN  Location: right lower back  Quality: spasm   Onset: November 23rd. She was pushing a shopping cart at [facility] and all of a sudden felt a sharp pain. Worse with: walking, standing  Better with: heating pad, muscle relaxers (flexeril)  Radiation: down right leg Trauma: Pulled her lower lumbar "years ago". No acute events in the last couple months. 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  Best sitting/standing/leaning forward: No comfortable positions  Red Flags Fecal/urinary incontinence: no  Numbness/Weakness of legs: no Fever/chills/sweats: no  Night pain: no  Unexplained weight loss: no  No relief with bedrest: no  h/o cancer/immunosuppression: no  IV drug use: no  PMH of osteoporosis or chronic steroid use: no    Review of Systems: Per HPI. All other systems reviewed and are negative.  Social Hx:  reports that she has been smoking Cigarettes.  She has been smoking about 0.50 packs per day. She has never used smokeless tobacco.   Objective:   BP (!) 147/97   Pulse 93   Temp 98.1 F (36.7 C) (Oral)   Wt 255 lb (115.7 kg)   LMP 01/02/2016 (Exact Date)   SpO2 99%   BMI 45.17 kg/m  Physical Exam  Gen: NAD, alert, cooperative with exam, well-appearing, morbidly obese HEENT: clear conjunctiva, oropharynx clear, supple neck Cardiac: Regular rate and rhythm Respiratory: Clear to auscultation bilaterally, no wheezes, non-labored breathing Gastrointestinal: soft, non tender, non distended, bowel sounds present, obese abdomen Skin: no rashes, normal turgor  Neurological: no gross deficits.  Psych: good insight, normal Wall and affect  MSK Back Exam:  Inspection: Unremarkable  Palpable tenderness: Tender to palpation on middle lumbar area and right parasternal Range of Motion:  Flexion 45 deg; Extension 45 deg; Side Bending to 45 deg bilaterally; Rotation to 45 deg bilaterally  Leg strength: Quad: 5/5 Hamstring: 5/5 Hip flexor: 5/5 Hip abductors: 5/5  Strength at foot: Plantar-flexion: 5/5 Dorsi-flexion: 5/5 Eversion: 5/5 Inversion: 5/5  Sensory change: Gross sensation intact to all lumbar and sacral dermatomes.  Reflexes: 2+ at both patellar tendons, 2+ at achilles tendons, Babinski's downgoing.  Gait unremarkable. SLR laying: Negative  XSLR laying: Negative  FABER: negative.u  Assessment & Plan:  Back pain Pain likely secondary to muscle strain in lumbar area. Patient is morbidly obese and she is also lifting and pulling people in her job which I feel both have contributed to her pain. Other differential includes herniated disc. No red flag symptoms that would suggest cauda equina or spinal stenosis at this time. Recommended rest, intermittent application of  heating pad, and 7 day course of Mobic 15 mg daily. Discussed longer term treatment plan of prn NSAID's and discussed a home back care exercise program with flexion exercise routine. Proper lifting with avoidance of heavy lifting discussed. Consider Physical Therapy and XRay studies if not improving. She will call or return to clinic as needed if these symptoms worsen or fail to improve as anticipated.    Smitty Cords, MD [place], PGY-2

## 2016-02-29 NOTE — Patient Instructions (Signed)
Thank you for coming in today, it was so nice to see you! Today we talked about:   Back Pain Treatment - you should take 1 pill of mobic for the next 7 days. Please take this medications with a full glass of water and a meal. Please try walking 10 minutes a day. At your job it is recommended that you try not using your back to lift.  You should be better in 1-2 weeks Call us or go to the ER if you lose control of your bowels or bladder or are weak in one leg  Come back to see [name] if your pain does not resolve in the next couple weeks  If you have any questions or concerns, please do not hesitate to call the office at [phone]. You can also message me directly via MyChart.   Sincerely,  Smitty Cords, MD

## 2016-03-03 DIAGNOSIS — M5442 Lumbago with sciatica, left side: Secondary | ICD-10-CM | POA: Insufficient documentation

## 2016-03-03 NOTE — Assessment & Plan Note (Addendum)
Pain likely secondary to muscle strain in lumbar area. Patient is morbidly obese and she is also lifting and pulling people in her job which I feel both have contributed to her pain. Other differential includes herniated disc. No red flag symptoms that would suggest cauda equina or spinal stenosis at this time. Recommended rest, intermittent application of  heating pad, and 7 day course of Mobic 15 mg daily. Discussed longer term treatment plan of prn NSAID's and discussed a home back care exercise program with flexion exercise routine. Proper lifting with avoidance of heavy lifting discussed. Consider Physical Therapy and XRay studies if not improving. She will call or return to clinic as needed if these symptoms worsen or fail to improve as anticipated.

## 2016-03-20 ENCOUNTER — Other Ambulatory Visit: Payer: Self-pay | Admitting: General Surgery

## 2016-03-20 DIAGNOSIS — N611 Abscess of the breast and nipple: Secondary | ICD-10-CM

## 2016-04-01 ENCOUNTER — Ambulatory Visit
Admission: RE | Admit: 2016-04-01 | Discharge: 2016-04-01 | Disposition: A | Discharge status: Home / Self Care | Payer: PRIVATE HEALTH INSURANCE | Source: Ambulatory Visit | Attending: General Surgery | Admitting: General Surgery

## 2016-04-01 ENCOUNTER — Other Ambulatory Visit: Payer: Self-pay | Admitting: Family Medicine

## 2016-04-01 DIAGNOSIS — N611 Abscess of the breast and nipple: Secondary | ICD-10-CM

## 2016-04-01 MED ORDER — HYDROCHLOROTHIAZIDE 25 MG PO TABS: 25.0000 mg | ORAL_TABLET | Freq: Every day | ORAL | 0 refills | Status: DC

## 2016-04-01 NOTE — Progress Notes (Signed)
Family Medicine After hours phone call  Patient paged asking for a refill on her antihypertensive medication. She states that she did not realize that our clinic would be closed today.  30 day refill provided.  Patient was reminded that she would need a office visit scheduled before this prescription runs out at the end of the month. Patient stated her understanding and said that she would make this appointment first thing tomorrow. Patient had no further questions.  Elberta Leatherwood, MD,MS,  PGY3 04/01/2016 10:16 AM

## 2016-04-02 ENCOUNTER — Other Ambulatory Visit: Payer: Self-pay | Admitting: Family Medicine

## 2016-04-02 NOTE — Telephone Encounter (Signed)
Pt needs a refill on BP medication. Pt uses [facility] @ [address]. ep

## 2016-04-04 MED ORDER — HYDROCHLOROTHIAZIDE 25 MG PO TABS: 25.0000 mg | ORAL_TABLET | Freq: Every day | ORAL | 1 refills | Status: DC

## 2016-04-04 NOTE — Telephone Encounter (Signed)
Refilled HCTZ.  CSD

## 2016-04-08 ENCOUNTER — Ambulatory Visit (INDEPENDENT_AMBULATORY_CARE_PROVIDER_SITE_OTHER): Payer: PRIVATE HEALTH INSURANCE | Admitting: Family Medicine

## 2016-04-08 ENCOUNTER — Encounter: Payer: Self-pay | Admitting: Family Medicine

## 2016-04-08 VITALS — BP 128/90 | HR 90 | Temp 98.4°F | Ht 63.0 in | Wt 262.0 lb

## 2016-04-08 DIAGNOSIS — G5603 Carpal tunnel syndrome, bilateral upper limbs: Secondary | ICD-10-CM

## 2016-04-08 DIAGNOSIS — M545 Low back pain, unspecified: Secondary | ICD-10-CM

## 2016-04-08 DIAGNOSIS — G629 Polyneuropathy, unspecified: Secondary | ICD-10-CM

## 2016-04-08 LAB — CBC WITH DIFFERENTIAL/PLATELET
BASOS PCT: 0 %
Basophils Absolute: 0 cells/uL (ref 0–200)
EOS ABS: 136 {cells}/uL (ref 15–500)
Eosinophils Relative: 2 %
HEMATOCRIT: 38.6 % (ref 35.0–45.0)
HEMOGLOBIN: 12.9 g/dL (ref 11.7–15.5)
LYMPHS PCT: 52 %
Lymphs Abs: 3536 cells/uL (ref 850–3900)
MCH: 29.9 pg (ref 27.0–33.0)
MCHC: 33.4 g/dL (ref 32.0–36.0)
MCV: 89.6 fL (ref 80.0–100.0)
MONO ABS: 612 {cells}/uL (ref 200–950)
MPV: 9.1 fL (ref 7.5–12.5)
Monocytes Relative: 9 %
NEUTROS PCT: 37 %
Neutro Abs: 2516 cells/uL (ref 1500–7800)
Platelets: 341 10*3/uL (ref 140–400)
RBC: 4.31 MIL/uL (ref 3.80–5.10)
RDW: 13.8 % (ref 11.0–15.0)
WBC: 6.8 10*3/uL (ref 3.8–10.8)

## 2016-04-08 LAB — BASIC METABOLIC PANEL WITH GFR
BUN: 14 mg/dL (ref 7–25)
CO2: 25 mmol/L (ref 20–31)
Calcium: 9.2 mg/dL (ref 8.6–10.2)
Chloride: 104 mmol/L (ref 98–110)
Creat: 0.95 mg/dL (ref 0.50–1.10)
GFR, EST AFRICAN AMERICAN: 87 mL/min (ref >=60)
GFR, Est Non African American: 75 mL/min (ref >=60)
GLUCOSE: 114 mg/dL — AB (ref 65–99)
POTASSIUM: 4.1 mmol/L (ref 3.5–5.3)
Sodium: 138 mmol/L (ref 135–146)

## 2016-04-08 LAB — TSH: TSH: 0.96 mIU/L

## 2016-04-08 MED ORDER — HYDROCHLOROTHIAZIDE 25 MG PO TABS: 25.0000 mg | ORAL_TABLET | Freq: Every day | ORAL | 1 refills | Status: DC

## 2016-04-08 MED ORDER — MELOXICAM 15 MG PO TABS: 15.0000 mg | ORAL_TABLET | Freq: Every day | ORAL | 0 refills | Status: DC

## 2016-04-08 MED ORDER — METHOCARBAMOL 500 MG PO TABS: 500.0000 mg | ORAL_TABLET | Freq: Three times a day (TID) | ORAL | 0 refills | Status: DC | PRN

## 2016-04-08 NOTE — Progress Notes (Signed)
Subjective: CC: back pain HPI: Patient is a 41 y.o. female with a past medical history of HTN and obesity presenting to clinic today a f/u on back pain.  BACK PAIN  Back pain began Nov 23rd Pain is described as "heavy," pressure, and pulling in the lumbar region bilaterally and radiates around the R flank with ambulation. Pain doesn't radiate down the legs. Prolonged sitting, standing, or walking makes it worse.  History of trauma or injury: Pulled her lower back "years ago". No acute events in the last couple months. 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   She was prescribed Mobic daily which she felt helped as well until she ran out.   Symptoms Incontinence of bowel or bladder:  no Numbness of leg: no Fever: no Rest or Night pain: no Weight Loss:  no Rash: no  She also notes chronic numbness and tingling in her hands. This is the most prominent at night. She has a h/o carpal tunnel and has been using splints without improvement. She notes that the numbness is the most prominent in the 1-4th fingers, however also notes mild numbness in her wrists up into her elbows as well. Denies any shooting pain. No cervical neck pain.  No trauma. Per EMR, has had issues with numbness of her feet bilaterally in the past as well.    Social History: current smoker  Flu Vaccine: declines today   ROS: All other systems reviewed and are negative.  Past Medical History Patient Active Problem List   Diagnosis Date Noted  . Back pain 03/03/2016  . Cellulitis of left breast 01/31/2016  . Muscle spasm of right shoulder 01/07/2016  . Tingling in extremities 02/15/2014  .  Carpal tunnel syndrome 07/07/2013  . Breast pain, left 05/12/2012  . Palpitations 10/27/2011  . Alcohol consumption binge drinking 10/27/2011  . DYSHIDROTIC ECZEMA 11/06/2008  . Moderate cigarette smoker (10-19 per day) 11/01/2008  . OBESITY 02/03/2007  . ANXIETY 05/28/2006  . HYPERTENSION, BENIGN SYSTEMIC 05/28/2006  . GASTROESOPHAGEAL REFLUX, NO ESOPHAGITIS 05/28/2006    Medications- reviewed and updated  Objective: Office vital signs reviewed. BP 128/90 (BP Location: Left Arm, Patient Position: Sitting, Cuff Size: Large)   Pulse 90   Temp 98.4 F (36.9 C) (Oral)   Ht 5\' 3"  (1.6 m)   Wt 262 lb (118.8 kg)   LMP 01/03/2016 (Approximate)   SpO2 98%   BMI 46.41 kg/m    Physical Examination:  General: Awake, alert, well- nourished, NAD No thyromegaly Cardio: RRR, no m/r/g noted.  Pulm: No increased WOB.  CTAB, without wheezes, rhonchi or crackles noted.  Back: normal to inspection without bruising. Tenderness to palpation over the midline and R paraspinous muscles in the lumbar region. No drop offs noted. Full ROM of the lumbar spine with reported tension in flexion and side bending (most notable with bending to left side). Negative SLR. 5/5 strength in the LEs bilaterally. 2/4 patellar and 1/4 Achilles DTRs. Sensation intact grossly in the LEs. Negative Spurling's test Hands: normal to inspection without swelling. + phalens, negative tinel's. 5/5 strength bilaterally.   Assessment/Plan: Carpal tunnel syndrome Exam mostly classic for carpal  tunnel, however pt notes numbness in wrist up to elbow which is not consistent. Advised pt to f/u her symptoms to better pinpoint where the issue is. Given she has a h/o numbness/tingling of feet per EMR as well in the past (not currently having, was attributed to immobility vs anxiety at that time), will check CBG. Will also check TSH and CBC. Advised pt to continue wrist splints at night for now.   Back pain Exam consistent with muscle  strain in the lumbar region. No red flags on exam or history. No need for imaging at this time. Discussed rest, light stretching, heat therapy, Rx for Mobic daily x 10 days, then daily PRN, and Robaxin as needed. Discussed appropriate lifting techniques and possible PT in the future. Return precautions discussed. Pt to f/u in 1 month or sooner as needed.    Orders Placed This Encounter  Procedures  . TSH  . CBC with Differential/Platelet  . BASIC METABOLIC PANEL WITH GFR    Meds ordered this encounter  Medications  . hydrochlorothiazide (HYDRODIURIL) 25 MG tablet    Sig: Take 1 tablet (25 mg total) by mouth daily.    Dispense:  30 tablet    Refill:  1  . meloxicam (MOBIC) 15 MG tablet    Sig: Take 1 tablet (15 mg total) by mouth daily. For 10 days, then daily as needed    Dispense:  30 tablet    Refill:  0  . methocarbamol (ROBAXIN) 500 MG tablet    Sig: Take 1 tablet (500 mg total) by mouth every 8 (eight) hours as needed for muscle spasms.    Dispense:  45 tablet    Refill:  [place] PGY-3, [place]

## 2016-04-08 NOTE — Patient Instructions (Addendum)
Back Exercises Introduction If you have pain in your back, do these exercises 2-3 times each day or as told by your doctor. When the pain goes away, do the exercises once each day, but repeat the steps more times for each exercise (do more repetitions). If you do not have pain in your back, do these exercises once each day or as told by your doctor. Exercises Single Knee to Chest  Do these steps 3-5 times in a row for each leg: 1. Lie on your back on a firm bed or the floor with your legs stretched out. 2. Bring one knee to your chest. 3. Hold your knee to your chest by grabbing your knee or thigh. 4. Pull on your knee until you feel a gentle stretch in your lower back. 5. Keep doing the stretch for 10-30 seconds. 6. Slowly let go of your leg and straighten it. Pelvic Tilt  Do these steps 5-10 times in a row: 1. Lie on your back on a firm bed or the floor with your legs stretched out. 2. Bend your knees so they point up to the ceiling. Your feet should be flat on the floor. 3. Tighten your lower belly (abdomen) muscles to press your lower back against the floor. This will make your tailbone point up to the ceiling instead of pointing down to your feet or the floor. 4. Stay in this position for 5-10 seconds while you gently tighten your muscles and breathe evenly. Cat-Cow  Do 5 sets total daily.  Do these steps until your lower back bends more easily: 1. Get on your hands and knees on a firm surface. Keep your hands under your shoulders, and keep your knees under your hips. You may put padding under your knees. 2. Let your head hang down, and make your tailbone point down to the floor so your lower back is round like the back of a cat. 3. Stay in this position for 5 seconds. 4. Slowly lift your head and make your tailbone point up to the ceiling so your back hangs low (sags) like the back of a cow. 5. Stay in this position for 5 seconds. Contact a doctor if:  Your back pain gets a lot  worse when you do an exercise.  Your back pain does not lessen 2 hours after you exercise. If you have any of these problems, stop doing the exercises. Do not do them again unless your doctor says it is okay. Get help right away if:  You have sudden, very bad back pain. If this happens, stop doing the exercises. Do not do them again unless your doctor says it is okay. This information is not intended to replace advice given to you by your health care provider. Make sure you discuss any questions you have with your health care provider. Document Released: 04/19/2010 Document Revised: 08/23/2015 Document Reviewed: 05/11/2014  2017 Elsevier

## 2016-04-10 ENCOUNTER — Telehealth: Payer: Self-pay | Admitting: Family Medicine

## 2016-04-10 NOTE — Telephone Encounter (Signed)
Would like test results hs

## 2016-04-11 NOTE — Assessment & Plan Note (Signed)
Exam consistent with muscle strain in the lumbar region. No red flags on exam or history. No need for imaging at this time. Discussed rest, light stretching, heat therapy, Rx for Mobic daily x 10 days, then daily PRN, and Robaxin as needed. Discussed appropriate lifting techniques and possible PT in the future. Return precautions discussed. Pt to f/u in 1 month or sooner as needed.

## 2016-04-11 NOTE — Telephone Encounter (Signed)
Called patient to discuss results, all normal.  pt concerned about continued numbness/tingling in hands most prominent at night.  She notes that it is the most prominent in the thumb.  After discussing her wrist splints, it sounds as though they are not fitting appropriately, she's still able to flex and extend her wrists significantly. Discussed making sure that they fit appropriately but aren't cutting off circulation. She will try this and if there's no improvement, will refer to ortho for possible steroid injections vs surgical intervention.  Archie Patten, MD [facility] Family Medicine Resident  04/11/2016, 8:27 AM

## 2016-04-11 NOTE — Assessment & Plan Note (Signed)
Exam mostly classic for carpal tunnel, however pt notes numbness in wrist up to elbow which is not consistent. Advised pt to f/u her symptoms to better pinpoint where the issue is. Given she has a h/o numbness/tingling of feet per EMR as well in the past (not currently having, was attributed to immobility vs anxiety at that time), will check CBG. Will also check TSH and CBC. Advised pt to continue wrist splints at night for now.

## 2016-04-18 ENCOUNTER — Telehealth: Payer: Self-pay | Admitting: Family Medicine

## 2016-04-18 DIAGNOSIS — G5603 Carpal tunnel syndrome, bilateral upper limbs: Secondary | ICD-10-CM

## 2016-04-18 NOTE — Telephone Encounter (Signed)
Pt has been wearing braces at night and the pain isnt going away.  Please advise what to do next

## 2016-04-21 NOTE — Telephone Encounter (Signed)
Please let the patient know that I referred her to orthopedics.   Thanks, Archie Patten, MD [facility] Family Medicine Resident  04/21/2016, 8:16 AM

## 2016-04-21 NOTE — Telephone Encounter (Signed)
Patient informed, would like to go to [facility]. FYI to referral coodinator

## 2016-05-12 ENCOUNTER — Ambulatory Visit (INDEPENDENT_AMBULATORY_CARE_PROVIDER_SITE_OTHER): Payer: PRIVATE HEALTH INSURANCE | Admitting: Family Medicine

## 2016-05-12 VITALS — BP 125/82 | HR 73 | Temp 98.5°F | Ht 63.0 in | Wt 264.4 lb

## 2016-05-12 DIAGNOSIS — R197 Diarrhea, unspecified: Secondary | ICD-10-CM | POA: Diagnosis not present

## 2016-05-12 DIAGNOSIS — R42 Dizziness and giddiness: Secondary | ICD-10-CM

## 2016-05-12 LAB — CBC WITH DIFFERENTIAL/PLATELET
BASOS ABS: 0 {cells}/uL (ref 0–200)
Basophils Relative: 0 %
EOS ABS: 60 {cells}/uL (ref 15–500)
EOS PCT: 1 %
HCT: 38.9 % (ref 35.0–45.0)
HEMOGLOBIN: 12.9 g/dL (ref 11.7–15.5)
LYMPHS ABS: 3360 {cells}/uL (ref 850–3900)
Lymphocytes Relative: 56 %
MCH: 29.5 pg (ref 27.0–33.0)
MCHC: 33.2 g/dL (ref 32.0–36.0)
MCV: 89 fL (ref 80.0–100.0)
MONOS PCT: 9 %
MPV: 9.2 fL (ref 7.5–12.5)
Monocytes Absolute: 540 cells/uL (ref 200–950)
NEUTROS ABS: 2040 {cells}/uL (ref 1500–7800)
Neutrophils Relative %: 34 %
PLATELETS: 334 10*3/uL (ref 140–400)
RBC: 4.37 MIL/uL (ref 3.80–5.10)
RDW: 13.9 % (ref 11.0–15.0)
WBC: 6 10*3/uL (ref 3.8–10.8)

## 2016-05-12 LAB — BASIC METABOLIC PANEL WITH GFR
BUN: 13 mg/dL (ref 7–25)
CALCIUM: 9.5 mg/dL (ref 8.6–10.2)
CO2: 24 mmol/L (ref 20–31)
Chloride: 107 mmol/L (ref 98–110)
Creat: 0.9 mg/dL (ref 0.50–1.10)
GFR, EST NON AFRICAN AMERICAN: 80 mL/min (ref >=60)
GLUCOSE: 112 mg/dL — AB (ref 65–99)
POTASSIUM: 3.9 mmol/L (ref 3.5–5.3)
SODIUM: 139 mmol/L (ref 135–146)

## 2016-05-12 NOTE — Patient Instructions (Signed)
Dizziness Dizziness is a common problem. It is a feeling of unsteadiness or light-headedness. You may feel like you are about to faint. Dizziness can lead to injury if you stumble or fall. Anyone can become dizzy, but dizziness is more common in older adults. This condition can be caused by a number of things, including medicines, dehydration, or illness. Follow these instructions at home: Taking these steps may help with your condition: Eating and drinking   Drink enough fluid to keep your urine clear or pale yellow. This helps to keep you from becoming dehydrated. Try to drink more clear fluids, such as water.  Do not drink alcohol.  Limit your caffeine intake if directed by your health care provider.  Limit your salt intake if directed by your health care provider. Activity   Avoid making quick movements.  Rise slowly from chairs and steady yourself until you feel okay.  In the morning, first sit up on the side of the bed. When you feel okay, stand slowly while you hold onto something until you know that your balance is fine.  Move your legs often if you need to stand in one place for a long time. Tighten and relax your muscles in your legs while you are standing.  Do not drive or operate heavy machinery if you feel dizzy.  Avoid bending down if you feel dizzy. Place items in your home so that they are easy for you to reach without leaning over. Lifestyle   Do not use any tobacco products, including cigarettes, chewing tobacco, or electronic cigarettes. If you need help quitting, ask your health care provider.  Try to reduce your stress level, such as with yoga or meditation. Talk with your health care provider if you need help. General instructions   Watch your dizziness for any changes.  Take medicines only as directed by your health care provider. Talk with your health care provider if you think that your dizziness is caused by a medicine that you are taking.  Tell a friend  or a family member that you are feeling dizzy. If he or she notices any changes in your behavior, have this person call your health care provider.  Keep all follow-up visits as directed by your health care provider. This is important. Contact a health care provider if:  Your dizziness does not go away.  Your dizziness or light-headedness gets worse.  You feel nauseous.  You have reduced hearing.  You have new symptoms.  You are unsteady on your feet or you feel like the room is spinning. Get help right away if:  You vomit or have diarrhea and are unable to eat or drink anything.  You have problems talking, walking, swallowing, or using your arms, hands, or legs.  You feel generally weak.  You are not thinking clearly or you have trouble forming sentences. It may take a friend or family member to notice this.  You have chest pain, abdominal pain, shortness of breath, or sweating.  Your vision changes.  You notice any bleeding.  You have a headache.  You have neck pain or a stiff neck.  You have a fever. This information is not intended to replace advice given to you by your health care provider. Make sure you discuss any questions you have with your health care provider. Document Released: 09/10/2000 Document Revised: 08/23/2015 Document Reviewed: 03/13/2014 Elsevier Interactive Patient Education  2017 Elsevier Inc.  

## 2016-05-12 NOTE — Progress Notes (Signed)
   Subjective: [id number] N Felt is a 41 y.o. female presenting to clinic today for same day appointment. PCP: Kathrine Cords, MD Concerns today include:  1. Dizziness Patient reports that dizziness started on Thursday, was worse yesterday.  She describes dizziness as lightheadedness.  Last time she felt like this her BP was low, also had a h/o dehydration requiring hospitalization in the past.  She reports diarrhea that has been ongoing for 1 month.  She notes that she has diarrheal stools 3-4 times daily every day.  No blood in stool.  Denies fevers, nausea, vomiting.  No new pets, unusual foods, recent travel.  She reports fatigue.  Denies abnormal vaginal bleeding or abnormal bleeding from elsewhere.  No Known Allergies  Social Hx reviewed. MedHx, current medications and allergies reviewed.  Please see EMR. ROS: Per HPI  Objective: Office vital signs reviewed. BP 125/82 (BP Location: Right Wrist, Patient Position: Sitting, Cuff Size: Normal)   Pulse 73   Temp 98.5 F (36.9 C) (Oral)   Ht 5\' 3"  (1.6 m)   Wt 264 lb 6.4 oz (119.9 kg)   LMP 05/05/2016 (Approximate)   SpO2 99%   BMI 46.84 kg/m   Physical Examination:  General: Awake, alert, well nourished, tired appearing, No acute distress HEENT: Normal    Neck: No masses palpated. No lymphadenopathy    Eyes: PERRLA, EOMI, sclera white    Throat: moist mucus membranes, no erythema, no tonsillar exudate.  Airway is patent Cardio: regular rate and rhythm, S1S2 heard, no murmurs appreciated Pulm: clear to auscultation bilaterally, no wheezes, rhonchi or rales; normal work of breathing on room air GI: obese, soft, non-tender, non-distended, bowel sounds present x4, no masses Neuro: 5/5 UE and LE Strength and UE and LE light touch sensation grossly intact, 2/4 patellar DTRs, CN 2-12 grossly in tact. No focal deficits on exam.  Orthostatic VS for the past 24 hrs:  BP- Lying Pulse- Lying BP- Sitting Pulse- Sitting BP-  Standing at 0 minutes Pulse- Standing at 0 minutes  05/12/16 1739 122/80 80 125/82 73 120/60 86    Assessment/ Plan: 41 y.o. female   1. Dizziness.  She has no neurologic deficits on exam.  She had negative orthostatic vital signs.  Her report of dark urine causes me to think that she likely has a component of dehydration.  Frequent diarrheal stools certainly would put patient at risk for electrolyte abnormalities and dehydration.  - Hydration encouraged - BASIC METABOLIC PANEL WITH GFR - CBC with Differential/Platelet - Will contact with results - Work excuse provided - If normal work up would consider referral to Vestibular PT for eval/ treatment. - Return precautions reviewed  2. Diarrhea, unspecified type.  03/2016 TSH reviewed and WNL.  Possible infectious etiology vs malabsorption. Offered to test stools.  Patient declines this at this time. - Recommend yogurt/ probiotic daily - Recommend follow up with PCP if loose stools persist.  Janora Norlander, DO PGY-3, [facility]

## 2016-05-13 ENCOUNTER — Other Ambulatory Visit: Payer: Self-pay | Admitting: Family Medicine

## 2016-05-13 ENCOUNTER — Telehealth: Payer: Self-pay

## 2016-05-13 DIAGNOSIS — R42 Dizziness and giddiness: Secondary | ICD-10-CM

## 2016-05-13 NOTE — Telephone Encounter (Signed)
-----   Message from Janora Norlander, DO sent at 05/13/2016  1:54 PM EST ----- No electrolyte abnormalities.  No evidence of infection or anemia.  Referral placed to vestibular PT.  Please inform patient.

## 2016-05-13 NOTE — Telephone Encounter (Signed)
Pt informed. Adalida Garver T Phillippe Orlick, CMA  

## 2016-05-13 NOTE — Progress Notes (Signed)
Results for orders placed or performed in visit on 05/12/16 (from the past 24 hour(s))  BASIC METABOLIC PANEL WITH GFR     Status: Abnormal   Collection Time: 05/12/16  3:10 PM  Result Value Ref Range   Sodium 139 135 - 146 mmol/L   Potassium 3.9 3.5 - 5.3 mmol/L   Chloride 107 98 - 110 mmol/L   CO2 24 20 - 31 mmol/L   Glucose, Bld 112 (H) 65 - 99 mg/dL   BUN 13 7 - 25 mg/dL   Creat 0.90 0.50 - 1.10 mg/dL   Calcium 9.5 8.6 - 10.2 mg/dL   GFR, Est African American >89 >=60 mL/min   GFR, Est Non African American 80 >=60 mL/min   Narrative   Performed at:  [address], [address]                [place], [place] [address] SOURCE: BLD&BLOOD; SOURCE: BLD&BLOOD; SOURCE: BLD&BLOOD  CBC with Differential/Platelet     Status: None   Collection Time: 05/12/16  3:10 PM  Result Value Ref Range   WBC 6.0 3.8 - 10.8 K/uL   RBC 4.37 3.80 - 5.10 MIL/uL   Hemoglobin 12.9 11.7 - 15.5 g/dL   HCT 38.9 35.0 - 45.0 %   MCV 89.0 80.0 - 100.0 fL   MCH 29.5 27.0 - 33.0 pg   MCHC 33.2 32.0 - 36.0 g/dL   RDW 13.9 11.0 - 15.0 %   Platelets 334 140 - 400 K/uL   MPV 9.2 7.5 - 12.5 fL   Neutro Abs 2,040 1,500 - 7,800 cells/uL   Lymphs Abs 3,360 850 - 3,900 cells/uL   Monocytes Absolute 540 200 - 950 cells/uL   Eosinophils Absolute 60 15 - 500 cells/uL   Basophils Absolute 0 0 - 200 cells/uL   Neutrophils Relative % 34 %   Lymphocytes Relative 56 %   Monocytes Relative 9 %   Eosinophils Relative 1 %   Basophils Relative 0 %   Smear Review Criteria for review not met    Narrative   Performed at:  [address], [address]                [place],  [address]    No electrolyte abnormalities or evidence of infection.  Will refer to Vestibular PT.

## 2016-06-02 ENCOUNTER — Telehealth: Payer: Self-pay | Admitting: Family Medicine

## 2016-06-02 NOTE — Telephone Encounter (Signed)
t called because we referred her to an orthopedic doctor. We sent the referral to Dr. Alfonso Ramus and she doesn't like him and would like [name] to send her to a different office. jw

## 2016-06-03 NOTE — Telephone Encounter (Signed)
Will forward to [place].  Archie Patten, MD [facility] Family Medicine Resident  06/03/2016, 8:57 PM

## 2016-06-04 NOTE — Telephone Encounter (Signed)
Referral sent to [place].

## 2016-06-13 ENCOUNTER — Telehealth: Payer: Self-pay | Admitting: Family Medicine

## 2016-06-13 NOTE — Telephone Encounter (Signed)
Return call to patient regarding her symptoms.  Patient reported heaviness of chest, SOB, no menstrual cycle in months and fatigue.  Pt stated she cant's even talk or walk across a parking lot without getting SOB.  Advised patient that she should be seen in the ED or urgent care.  No appointments this afternoon at [facility] due to early closing of clinic.  Pt denies dizziness, headache, visual changes, n/v or numbness/tingling of arms/legs.  Patient stated if she did not feel better by tomorrow she was going to ED.  Advised patient to call after hours line if she needed to speak with a provider from [facility].  Patient voiced understanding. Will forward to PCP. Derl Barrow, RN

## 2016-06-13 NOTE — Telephone Encounter (Signed)
Pt would like to talk to a nurse. She isnt feeling good.  She is feeling really tired, heavy in her chest.  She hasnt had a period since oct but says she isnt pregnant because she is a lesbian. She doesn't know what to do.

## 2016-06-16 ENCOUNTER — Ambulatory Visit (INDEPENDENT_AMBULATORY_CARE_PROVIDER_SITE_OTHER): Payer: Self-pay

## 2016-06-16 ENCOUNTER — Ambulatory Visit (INDEPENDENT_AMBULATORY_CARE_PROVIDER_SITE_OTHER): Payer: PRIVATE HEALTH INSURANCE | Admitting: Orthopaedic Surgery

## 2016-06-16 ENCOUNTER — Encounter (INDEPENDENT_AMBULATORY_CARE_PROVIDER_SITE_OTHER): Payer: Self-pay | Admitting: Orthopaedic Surgery

## 2016-06-16 DIAGNOSIS — M79641 Pain in right hand: Secondary | ICD-10-CM | POA: Diagnosis not present

## 2016-06-16 DIAGNOSIS — M25532 Pain in left wrist: Secondary | ICD-10-CM

## 2016-06-16 DIAGNOSIS — M79642 Pain in left hand: Secondary | ICD-10-CM

## 2016-06-16 DIAGNOSIS — M25531 Pain in right wrist: Secondary | ICD-10-CM | POA: Diagnosis not present

## 2016-06-16 NOTE — Progress Notes (Signed)
Office Visit Note   Patient: Claudia Wall           Date of Birth: [birth year]           MRN: [id number] Visit Date: 06/16/2016              Requested by: Archie Patten, MD [address], [place] [address] PCP: Kathrine Cords, MD   Assessment & Plan: Visit Diagnoses:  1. Bilateral hand pain   2. Bilateral wrist pain     Plan: Nerve conduction study to evaluate severity of suspected carpal tunnel syndrome bilaterally. Continue nighttime bracing for now. Ibuprofen as needed.  Follow-Up Instructions: Return in about 10 days (around 06/26/2016).   Orders:  Orders Placed This Encounter  Procedures  . Ambulatory referral to Physical Medicine Rehab   No orders of the defined types were placed in this encounter.     Procedures: No procedures performed   Clinical Data: No additional findings.   Subjective: Chief Complaint  Patient presents with  . Right Hand - Pain  . Left Hand - Pain  . Right Wrist - Pain  . Left Wrist - Pain    Patient is a 41 year old female with bilateral hand pain and numbness is constant and worse at night. She endorses tingling burning and numbness. She has worn carpal tunnel braces with partial relief. This is been getting worse progressively over the last year. She has not taken any medicines. The pain does not radiate. She drops things.    Review of Systems  Constitutional: Negative.   HENT: Negative.   Eyes: Negative.   Respiratory: Negative.   Cardiovascular: Negative.   Endocrine: Negative.   Musculoskeletal: Negative.   Neurological: Negative.   Hematological: Negative.   Psychiatric/Behavioral: Negative.   All other systems reviewed and are negative.    Objective: Vital Signs: There were no vitals taken for this visit.  Physical Exam  Constitutional: She is oriented to person, place, and time. She appears well-developed and well-nourished.  HENT:  Head: Normocephalic and atraumatic.  Eyes: EOM are  normal.  Neck: Neck supple.  Pulmonary/Chest: Effort normal.  Abdominal: Soft.  Neurological: She is alert and oriented to person, place, and time.  Skin: Skin is warm. Capillary refill takes less than 2 seconds.  Psychiatric: She has a normal mood and affect. Her behavior is normal. Judgment and thought content normal.  Nursing note and vitals reviewed.   Ortho Exam Bilateral hand exam shows no muscular flattening or atrophy. She has positive Durkin's, Tinel, Phalen's test. Hand is warm well-perfused Specialty Comments:  No specialty comments available.  Imaging: No results found.   PMFS History: Patient Active Problem List   Diagnosis Date Noted  . Back pain 03/03/2016  . Cellulitis of left breast 01/31/2016  . Muscle spasm of right shoulder 01/07/2016  . Tingling in extremities 02/15/2014  . Carpal tunnel syndrome 07/07/2013  . Breast pain, left 05/12/2012  . Palpitations 10/27/2011  . Alcohol consumption binge drinking 10/27/2011  . DYSHIDROTIC ECZEMA 11/06/2008  . Moderate cigarette smoker (10-19 per day) 11/01/2008  . OBESITY 02/03/2007  . ANXIETY 05/28/2006  . HYPERTENSION, BENIGN SYSTEMIC 05/28/2006  . GASTROESOPHAGEAL REFLUX, NO ESOPHAGITIS 05/28/2006   Past Medical History:  Diagnosis Date  . Breast abscess   . HTN (hypertension)   . Irregular periods/menstrual cycles   . Mastitis   . Obesity   . Obesity   . Tobacco abuse   . Tobacco abuse  Family History  Problem Relation Age of Onset  . Cancer Mother   . Hypertension Mother   . Hypertension Father     Past Surgical History:  Procedure Laterality Date  . CHOLECYSTECTOMY  2002  . INCISION AND DRAINAGE BREAST ABSCESS     Social History   Occupational History  . Not on file.   Social History Main Topics  . Smoking status: Current Some Day Smoker    Packs/day: 0.50    Types: Cigarettes  . Smokeless tobacco: Never Used     Comment: "used to smoke 1/2 to 1ppd depending on stress"  .  Alcohol use 1.8 oz/week    3 Glasses of wine per week     Comment: occassional  . Drug use: No  . Sexual activity: Yes    Birth control/ protection: Other-see comments, None     Comment: With women

## 2016-06-24 ENCOUNTER — Ambulatory Visit (INDEPENDENT_AMBULATORY_CARE_PROVIDER_SITE_OTHER): Payer: PRIVATE HEALTH INSURANCE | Admitting: Orthopaedic Surgery

## 2016-07-03 ENCOUNTER — Ambulatory Visit (INDEPENDENT_AMBULATORY_CARE_PROVIDER_SITE_OTHER): Payer: PRIVATE HEALTH INSURANCE | Admitting: Physical Medicine and Rehabilitation

## 2016-07-03 ENCOUNTER — Encounter (INDEPENDENT_AMBULATORY_CARE_PROVIDER_SITE_OTHER): Payer: Self-pay | Admitting: Physical Medicine and Rehabilitation

## 2016-07-03 DIAGNOSIS — R202 Paresthesia of skin: Secondary | ICD-10-CM | POA: Diagnosis not present

## 2016-07-03 NOTE — Progress Notes (Signed)
DIM MEISINGER - 41 y.o. female MRN [id number]  Date of birth: 12-02-1975  Office Visit Note: Visit Date: 07/03/2016 PCP: Kathrine Cords, MD Referred by: Archie Patten, MD  Subjective: Chief Complaint  Patient presents with  . Right Hand - Pain  . Left Hand - Pain   HPI: [name] is a pleasant right hand dominant 41 year old female who complains of chronic worsening hand pain with numbness and tingling in the radial digits worsening over the last year.  She now reports constant symptoms but worse at night. Again, she has tingling, burning, and numbness particularly in the radial 3 digits bilaterally. She has worn carpal tunnel braces with some relief initially but not now. She reports that she has been dropping some objects. She's had no prior electrodiagnostic studies. She has been using ibuprofen    ROS Otherwise per HPI.  Assessment & Plan: Visit Diagnoses:  1. Paresthesia of skin     Plan: No additional findings.  Impression: The above electrodiagnostic study is ABNORMAL and reveals evidence of a moderate bilateral median nerve entrapment at the wrist (carpal tunnel syndrome) affecting sensory and motor components. The left side is slightly more severe with mild evidence of sensory axonal changes.   There is no significant electrodiagnostic evidence of any other focal nerve entrapment, brachial plexopathy or cervical radiculopathy.   Recommendations: 1.  Follow-up with referring physician. 2.  Continue current management of symptoms. 3.  Continue use of resting splint at night-time and as needed during the day. 4.  Suggest surgical evaluation.   Meds & Orders: No orders of the defined types were placed in this encounter.   Orders Placed This Encounter  Procedures  . NCV with EMG (electromyography)    Follow-up: Return for Dr. Erlinda Hong appointment.   Procedures: No procedures performed  EMG & NCV Findings: Evaluation of the left median motor and the right median  motor nerves showed prolonged distal onset latency (L6.0, R5.5 ms) and decreased conduction velocity (Elbow-Wrist, L49, R49 m/s).  The left median (across palm) sensory nerve showed prolonged distal peak latency (Wrist, 7.8 ms), reduced amplitude (2.5 V), and prolonged distal peak latency (Palm, 4.8 ms).  The right median (across palm) sensory nerve showed prolonged distal peak latency (Wrist, 5.9 ms) and prolonged distal peak latency (Palm, 4.9 ms).  All remaining nerves (as indicated in the following tables) were within normal limits.  Left vs. Right side comparison data for the ulnar motor nerve indicates abnormal L-R velocity difference (B Elbow-Wrist, 11 m/s).  All remaining left vs. right side differences were within normal limits.    All examined muscles (as indicated in the following table) showed no evidence of electrical instability.    Impression: The above electrodiagnostic study is ABNORMAL and reveals evidence of a moderate bilateral median nerve entrapment at the wrist (carpal tunnel syndrome) affecting sensory and motor components. The left side is slightly more severe with mild evidence of sensory axonal changes.   There is no significant electrodiagnostic evidence of any other focal nerve entrapment, brachial plexopathy or cervical radiculopathy.   Recommendations: 1.  Follow-up with referring physician. 2.  Continue current management of symptoms. 3.  Continue use of resting splint at night-time and as needed during the day. 4.  Suggest surgical evaluation.   Nerve Conduction Studies Anti Sensory Summary Table   Stim Site NR Peak (ms) Norm Peak (ms) P-T Amp (V) Norm P-T Amp Site1 Site2 Delta-P (ms) Dist (cm) Vel (m/s) Norm Vel (m/s)  Left  Median Acr Palm Anti Sensory (2nd Digit)  34.9C  Wrist    *7.8 <3.6 *2.5 >10 Wrist Palm 3.0 0.0    Palm    *4.8 <2.0 4.9         Right Median Acr Palm Anti Sensory (2nd Digit)  33.6C  Wrist    *5.9 <3.6 17.3 >10 Wrist Palm 1.0 0.0      Palm    *4.9 <2.0 3.9         Left Radial Anti Sensory (Base 1st Digit)  33C  Wrist    1.8 <3.1 33.8  Wrist Base 1st Digit 1.8 0.0    Right Radial Anti Sensory (Base 1st Digit)  32.9C  Wrist    1.7 <3.1 21.9  Wrist Base 1st Digit 1.7 0.0    Left Ulnar Anti Sensory (5th Digit)  33.7C  Wrist    2.9 <3.7 39.5 >15.0 Wrist 5th Digit 2.9 14.0 48 >38  Right Ulnar Anti Sensory (5th Digit)  33.3C  Wrist    2.8 <3.7 32.5 >15.0 Wrist 5th Digit 2.8 14.0 50 >38   Motor Summary Table   Stim Site NR Onset (ms) Norm Onset (ms) O-P Amp (mV) Norm O-P Amp Site1 Site2 Delta-0 (ms) Dist (cm) Vel (m/s) Norm Vel (m/s)  Left Median Motor (Abd Poll Brev)  33C  Wrist    *6.0 <4.2 8.2 >5 Elbow Wrist 4.5 22.0 *49 >50  Elbow    10.5  7.9         Right Median Motor (Abd Poll Brev)  32.6C  Wrist    *5.5 <4.2 9.5 >5 Elbow Wrist 4.4 21.5 *49 >50  Elbow    9.9  9.5         Left Ulnar Motor (Abd Dig Min)  33.1C  Wrist    2.6 <4.2 12.5 >3 B Elbow Wrist 3.1 20.0 65 >53  B Elbow    5.7  10.4  A Elbow B Elbow 1.2 11.0 92 >53  A Elbow    6.9  12.4         Right Ulnar Motor (Abd Dig Min)  32.1C  Wrist    2.7 <4.2 11.9 >3 B Elbow Wrist 2.8 21.3 76 >53  B Elbow    5.5  12.3  A Elbow B Elbow 1.3 11.0 85 >53  A Elbow    6.8  12.4          EMG   Side Muscle Nerve Root Ins Act Fibs Psw Amp Dur Poly Recrt Int Fraser Din Comment  Left Abd Poll Brev Median C8-T1 Nml Nml Nml Nml Nml 0 Nml Nml   Left 1stDorInt Ulnar C8-T1 Nml Nml Nml Nml Nml 0 Nml Nml   Left PronatorTeres Median C6-7 Nml Nml Nml Nml Nml 0 Nml Nml   Left Biceps Musculocut C5-6 Nml Nml Nml Nml Nml 0 Nml Nml   Left Deltoid Axillary C5-6 Nml Nml Nml Nml Nml 0 Nml Nml     Nerve Conduction Studies Anti Sensory Left/Right Comparison   Stim Site L Lat (ms) R Lat (ms) L-R Lat (ms) L Amp (V) R Amp (V) L-R Amp (%) Site1 Site2 L Vel (m/s) R Vel (m/s) L-R Vel (m/s)  Median Acr Palm Anti Sensory (2nd Digit)  34.9C  Wrist *7.8 *5.9 1.9 *2.5 17.3 85.5 Wrist Palm      Palm *4.8 *4.9 0.1 4.9 3.9 20.4       Radial Anti Sensory (Base 1st Digit)  33C  Wrist 1.8 1.7 0.1  33.8 21.9 35.2 Wrist Base 1st Digit     Ulnar Anti Sensory (5th Digit)  33.7C  Wrist 2.9 2.8 0.1 39.5 32.5 17.7 Wrist 5th Digit 48 50 2   Motor Left/Right Comparison   Stim Site L Lat (ms) R Lat (ms) L-R Lat (ms) L Amp (mV) R Amp (mV) L-R Amp (%) Site1 Site2 L Vel (m/s) R Vel (m/s) L-R Vel (m/s)  Median Motor (Abd Poll Brev)  33C  Wrist *6.0 *5.5 0.5 8.2 9.5 13.7 Elbow Wrist *49 *49 0  Elbow 10.5 9.9 0.6 7.9 9.5 16.8       Ulnar Motor (Abd Dig Min)  33.1C  Wrist 2.6 2.7 0.1 12.5 11.9 4.8 B Elbow Wrist 65 76 *11  B Elbow 5.7 5.5 0.2 10.4 12.3 15.4 A Elbow B Elbow 92 85 7  A Elbow 6.9 6.8 0.1 12.4 12.4 0.0             Clinical History: No specialty comments available.  She reports that she has been smoking Cigarettes.  She has been smoking about 0.50 packs per day. She has never used smokeless tobacco. No results for input(s): HGBA1C, LABURIC in the last 8760 hours.  Objective:  VS:  HT:    WT:   BMI:     BP:   HR: bpm  TEMP: ( )  RESP:  Physical Exam  Musculoskeletal:  Inspection reveals no atrophy of the bilateral APB or FDI or hand intrinsics. There is no swelling, color changes, allodynia or dystrophic changes. There is 5 out of 5 strength in the bilateral wrist extension, finger abduction and long finger flexion. There is impaired sensation to light touch in the bilateral median nerve distribution. There is a positive Phalen's test bilaterally. There is a negative Hoffmann's test bilaterally.    Ortho Exam Imaging: No results found.  Past Medical/Family/Surgical/Social History: Medications & Allergies reviewed per EMR Patient Active Problem List   Diagnosis Date Noted  . Back pain 03/03/2016  . Cellulitis of left breast 01/31/2016  . Muscle spasm of right shoulder 01/07/2016  . Tingling in extremities 02/15/2014  . Carpal tunnel syndrome 07/07/2013  . Breast  pain, left 05/12/2012  . Palpitations 10/27/2011  . Alcohol consumption binge drinking 10/27/2011  . DYSHIDROTIC ECZEMA 11/06/2008  . Moderate cigarette smoker (10-19 per day) 11/01/2008  . OBESITY 02/03/2007  . ANXIETY 05/28/2006  . HYPERTENSION, BENIGN SYSTEMIC 05/28/2006  . GASTROESOPHAGEAL REFLUX, NO ESOPHAGITIS 05/28/2006   Past Medical History:  Diagnosis Date  . Breast abscess   . HTN (hypertension)   . Irregular periods/menstrual cycles   . Mastitis   . Obesity   . Obesity   . Tobacco abuse   . Tobacco abuse    Family History  Problem Relation Age of Onset  . Cancer Mother   . Hypertension Mother   . Hypertension Father    Past Surgical History:  Procedure Laterality Date  . CHOLECYSTECTOMY  2002  . INCISION AND DRAINAGE BREAST ABSCESS     Social History   Occupational History  . Not on file.   Social History Main Topics  . Smoking status: Current Some Day Smoker    Packs/day: 0.50    Types: Cigarettes  . Smokeless tobacco: Never Used     Comment: "used to smoke 1/2 to 1ppd depending on stress"  . Alcohol use 1.8 oz/week    3 Glasses of wine per week     Comment: occassional  . Drug use: No  . Sexual  activity: Yes    Birth control/ protection: Other-see comments, None     Comment: With women

## 2016-07-07 NOTE — Procedures (Signed)
EMG & NCV Findings: Evaluation of the left median motor and the right median motor nerves showed prolonged distal onset latency (L6.0, R5.5 ms) and decreased conduction velocity (Elbow-Wrist, L49, R49 m/s).  The left median (across palm) sensory nerve showed prolonged distal peak latency (Wrist, 7.8 ms), reduced amplitude (2.5 V), and prolonged distal peak latency (Palm, 4.8 ms).  The right median (across palm) sensory nerve showed prolonged distal peak latency (Wrist, 5.9 ms) and prolonged distal peak latency (Palm, 4.9 ms).  All remaining nerves (as indicated in the following tables) were within normal limits.  Left vs. Right side comparison data for the ulnar motor nerve indicates abnormal L-R velocity difference (B Elbow-Wrist, 11 m/s).  All remaining left vs. right side differences were within normal limits.    All examined muscles (as indicated in the following table) showed no evidence of electrical instability.    Impression: The above electrodiagnostic study is ABNORMAL and reveals evidence of a moderate bilateral median nerve entrapment at the wrist (carpal tunnel syndrome) affecting sensory and motor components. The left side is slightly more severe with mild evidence of sensory axonal changes.   There is no significant electrodiagnostic evidence of any other focal nerve entrapment, brachial plexopathy or cervical radiculopathy.   Recommendations: 1.  Follow-up with referring physician. 2.  Continue current management of symptoms. 3.  Continue use of resting splint at night-time and as needed during the day. 4.  Suggest surgical evaluation.   Nerve Conduction Studies Anti Sensory Summary Table   Stim Site NR Peak (ms) Norm Peak (ms) P-T Amp (V) Norm P-T Amp Site1 Site2 Delta-P (ms) Dist (cm) Vel (m/s) Norm Vel (m/s)  Left Median Acr Palm Anti Sensory (2nd Digit)  34.9C  Wrist    *7.8 <3.6 *2.5 >10 Wrist Palm 3.0 0.0    Palm    *4.8 <2.0 4.9         Right Median Acr Palm Anti  Sensory (2nd Digit)  33.6C  Wrist    *5.9 <3.6 17.3 >10 Wrist Palm 1.0 0.0    Palm    *4.9 <2.0 3.9         Left Radial Anti Sensory (Base 1st Digit)  33C  Wrist    1.8 <3.1 33.8  Wrist Base 1st Digit 1.8 0.0    Right Radial Anti Sensory (Base 1st Digit)  32.9C  Wrist    1.7 <3.1 21.9  Wrist Base 1st Digit 1.7 0.0    Left Ulnar Anti Sensory (5th Digit)  33.7C  Wrist    2.9 <3.7 39.5 >15.0 Wrist 5th Digit 2.9 14.0 48 >38  Right Ulnar Anti Sensory (5th Digit)  33.3C  Wrist    2.8 <3.7 32.5 >15.0 Wrist 5th Digit 2.8 14.0 50 >38   Motor Summary Table   Stim Site NR Onset (ms) Norm Onset (ms) O-P Amp (mV) Norm O-P Amp Site1 Site2 Delta-0 (ms) Dist (cm) Vel (m/s) Norm Vel (m/s)  Left Median Motor (Abd Poll Brev)  33C  Wrist    *6.0 <4.2 8.2 >5 Elbow Wrist 4.5 22.0 *49 >50  Elbow    10.5  7.9         Right Median Motor (Abd Poll Brev)  32.6C  Wrist    *5.5 <4.2 9.5 >5 Elbow Wrist 4.4 21.5 *49 >50  Elbow    9.9  9.5         Left Ulnar Motor (Abd Dig Min)  33.1C  Wrist    2.6 <4.2 12.5 >3  B Elbow Wrist 3.1 20.0 65 >53  B Elbow    5.7  10.4  A Elbow B Elbow 1.2 11.0 92 >53  A Elbow    6.9  12.4         Right Ulnar Motor (Abd Dig Min)  32.1C  Wrist    2.7 <4.2 11.9 >3 B Elbow Wrist 2.8 21.3 76 >53  B Elbow    5.5  12.3  A Elbow B Elbow 1.3 11.0 85 >53  A Elbow    6.8  12.4          EMG   Side Muscle Nerve Root Ins Act Fibs Psw Amp Dur Poly Recrt Int Fraser Din Comment  Left Abd Poll Brev Median C8-T1 Nml Nml Nml Nml Nml 0 Nml Nml   Left 1stDorInt Ulnar C8-T1 Nml Nml Nml Nml Nml 0 Nml Nml   Left PronatorTeres Median C6-7 Nml Nml Nml Nml Nml 0 Nml Nml   Left Biceps Musculocut C5-6 Nml Nml Nml Nml Nml 0 Nml Nml   Left Deltoid Axillary C5-6 Nml Nml Nml Nml Nml 0 Nml Nml     Nerve Conduction Studies Anti Sensory Left/Right Comparison   Stim Site L Lat (ms) R Lat (ms) L-R Lat (ms) L Amp (V) R Amp (V) L-R Amp (%) Site1 Site2 L Vel (m/s) R Vel (m/s) L-R Vel (m/s)  Median Acr Palm Anti  Sensory (2nd Digit)  34.9C  Wrist *7.8 *5.9 1.9 *2.5 17.3 85.5 Wrist Palm     Palm *4.8 *4.9 0.1 4.9 3.9 20.4       Radial Anti Sensory (Base 1st Digit)  33C  Wrist 1.8 1.7 0.1 33.8 21.9 35.2 Wrist Base 1st Digit     Ulnar Anti Sensory (5th Digit)  33.7C  Wrist 2.9 2.8 0.1 39.5 32.5 17.7 Wrist 5th Digit 48 50 2   Motor Left/Right Comparison   Stim Site L Lat (ms) R Lat (ms) L-R Lat (ms) L Amp (mV) R Amp (mV) L-R Amp (%) Site1 Site2 L Vel (m/s) R Vel (m/s) L-R Vel (m/s)  Median Motor (Abd Poll Brev)  33C  Wrist *6.0 *5.5 0.5 8.2 9.5 13.7 Elbow Wrist *49 *49 0  Elbow 10.5 9.9 0.6 7.9 9.5 16.8       Ulnar Motor (Abd Dig Min)  33.1C  Wrist 2.6 2.7 0.1 12.5 11.9 4.8 B Elbow Wrist 65 76 *11  B Elbow 5.7 5.5 0.2 10.4 12.3 15.4 A Elbow B Elbow 92 85 7  A Elbow 6.9 6.8 0.1 12.4 12.4 0.0

## 2016-07-08 ENCOUNTER — Ambulatory Visit (INDEPENDENT_AMBULATORY_CARE_PROVIDER_SITE_OTHER): Payer: PRIVATE HEALTH INSURANCE | Admitting: Orthopaedic Surgery

## 2016-07-08 ENCOUNTER — Encounter (INDEPENDENT_AMBULATORY_CARE_PROVIDER_SITE_OTHER): Payer: Self-pay | Admitting: Orthopaedic Surgery

## 2016-07-08 DIAGNOSIS — G5602 Carpal tunnel syndrome, left upper limb: Secondary | ICD-10-CM | POA: Diagnosis not present

## 2016-07-08 DIAGNOSIS — G5601 Carpal tunnel syndrome, right upper limb: Secondary | ICD-10-CM | POA: Diagnosis not present

## 2016-07-08 NOTE — Progress Notes (Signed)
   Office Visit Note   Patient: GRICEL COPEN           Date of Birth: 30-May-1975           MRN: [id number] Visit Date: 07/08/2016              Requested by: Archie Patten, MD [address], [place] [address] PCP: Kathrine Cords, MD   Assessment & Plan: Visit Diagnoses:  1. Right carpal tunnel syndrome   2. Left carpal tunnel syndrome     Plan: We discussed carpal tunnel injections for moderate carpal tunnel syndrome. Patient has not done well with nighttime bracing and is actually worse from this. She will give [name] a call when she is ready to have an injection  Follow-Up Instructions: Return if symptoms worsen or fail to improve.   Orders:  No orders of the defined types were placed in this encounter.  No orders of the defined types were placed in this encounter.     Procedures: No procedures performed   Clinical Data: No additional findings.   Subjective: Chief Complaint  Patient presents with  . Left Hand - Pain, Follow-up  . Right Hand - Pain, Follow-up    Patient follows up today to review her nerve conduction studies. She states both hands tingle and burn and are worse at night. Wearing the braces makes her symptoms worse.    Review of Systems   Objective: Vital Signs: There were no vitals taken for this visit.  Physical Exam  Ortho Exam Bilateral hand and wrist exam are unchanged Specialty Comments:  No specialty comments available.  Imaging: No results found.   PMFS History: Patient Active Problem List   Diagnosis Date Noted  . Left carpal tunnel syndrome 07/08/2016  . Back pain 03/03/2016  . Cellulitis of left breast 01/31/2016  . Muscle spasm of right shoulder 01/07/2016  . Tingling in extremities 02/15/2014  . Right carpal tunnel syndrome 07/07/2013  . Breast pain, left 05/12/2012  . Palpitations 10/27/2011  . Alcohol consumption binge drinking 10/27/2011  . DYSHIDROTIC ECZEMA 11/06/2008  . Moderate cigarette  smoker (10-19 per day) 11/01/2008  . OBESITY 02/03/2007  . ANXIETY 05/28/2006  . HYPERTENSION, BENIGN SYSTEMIC 05/28/2006  . GASTROESOPHAGEAL REFLUX, NO ESOPHAGITIS 05/28/2006   Past Medical History:  Diagnosis Date  . Breast abscess   . HTN (hypertension)   . Irregular periods/menstrual cycles   . Mastitis   . Obesity   . Obesity   . Tobacco abuse   . Tobacco abuse     Family History  Problem Relation Age of Onset  . Cancer Mother   . Hypertension Mother   . Hypertension Father     Past Surgical History:  Procedure Laterality Date  . CHOLECYSTECTOMY  2002  . INCISION AND DRAINAGE BREAST ABSCESS     Social History   Occupational History  . Not on file.   Social History Main Topics  . Smoking status: Current Some Day Smoker    Packs/day: 0.50    Types: Cigarettes  . Smokeless tobacco: Never Used     Comment: "used to smoke 1/2 to 1ppd depending on stress"  . Alcohol use 1.8 oz/week    3 Glasses of wine per week     Comment: occassional  . Drug use: No  . Sexual activity: Yes    Birth control/ protection: Other-see comments, None     Comment: With women

## 2016-07-14 ENCOUNTER — Ambulatory Visit (INDEPENDENT_AMBULATORY_CARE_PROVIDER_SITE_OTHER): Payer: PRIVATE HEALTH INSURANCE | Admitting: Orthopaedic Surgery

## 2016-07-14 DIAGNOSIS — G5601 Carpal tunnel syndrome, right upper limb: Secondary | ICD-10-CM | POA: Diagnosis not present

## 2016-07-14 DIAGNOSIS — G5602 Carpal tunnel syndrome, left upper limb: Secondary | ICD-10-CM | POA: Diagnosis not present

## 2016-07-14 MED ORDER — BUPIVACAINE HCL 0.5 % IJ SOLN
0.5000 mL | INTRAMUSCULAR | Status: AC | PRN
  Administered 2016-07-14: .5 mL

## 2016-07-14 MED ORDER — METHYLPREDNISOLONE ACETATE 40 MG/ML IJ SUSP
20.0000 mg | INTRAMUSCULAR | Status: AC | PRN
  Administered 2016-07-14: 20 mg

## 2016-07-14 MED ORDER — LIDOCAINE HCL 1 % IJ SOLN
0.5000 mL | INTRAMUSCULAR | Status: AC | PRN
  Administered 2016-07-14: .5 mL

## 2016-07-14 NOTE — Progress Notes (Signed)
Office Visit Note   Patient: Claudia Wall           Date of Birth: [birth year]           MRN: [id number] Visit Date: 07/14/2016              Requested by: Archie Patten, MD [address], [place] [address] PCP: Kathrine Cords, MD   Assessment & Plan: Visit Diagnoses:  1. Left carpal tunnel syndrome   2. Right carpal tunnel syndrome     Plan: Left carpal tunnel injection was performed under sterile conditions. Patient tolerated this well.  Follow-Up Instructions: Return if symptoms worsen or fail to improve.   Orders:  No orders of the defined types were placed in this encounter.  No orders of the defined types were placed in this encounter.     Procedures: Hand/UE Inj Date/Time: 07/14/2016 4:54 PM Performed by: Leandrew Koyanagi Authorized by: Leandrew Koyanagi   Consent Given by:  Patient Timeout: prior to procedure the correct patient, procedure, and site was verified   Condition: carpal tunnel   Site:  L carpal tunnel Prep: patient was prepped and draped in usual sterile fashion   Needle Size:  25 G Approach:  Volar Medications:  0.5 mL lidocaine 1 %; 0.5 mL bupivacaine 0.5 %; 20 mg methylPREDNISolone acetate 40 MG/ML     Clinical Data: No additional findings.   Subjective: Chief Complaint  Patient presents with  . Right Hand - Pain  . Left Hand - Pain    [name] follows up today for left carpal tunnel injection. She denies any changes in medical history.    Review of Systems   Objective: Vital Signs: There were no vitals taken for this visit.  Physical Exam  Ortho Exam Exam is unchanged. Specialty Comments:  No specialty comments available.  Imaging: No results found.   PMFS History: Patient Active Problem List   Diagnosis Date Noted  . Left carpal tunnel syndrome 07/08/2016  . Back pain 03/03/2016  . Cellulitis of left breast 01/31/2016  . Muscle spasm of right shoulder 01/07/2016  . Tingling in extremities 02/15/2014    . Right carpal tunnel syndrome 07/07/2013  . Breast pain, left 05/12/2012  . Palpitations 10/27/2011  . Alcohol consumption binge drinking 10/27/2011  . DYSHIDROTIC ECZEMA 11/06/2008  . Moderate cigarette smoker (10-19 per day) 11/01/2008  . OBESITY 02/03/2007  . ANXIETY 05/28/2006  . HYPERTENSION, BENIGN SYSTEMIC 05/28/2006  . GASTROESOPHAGEAL REFLUX, NO ESOPHAGITIS 05/28/2006   Past Medical History:  Diagnosis Date  . Breast abscess   . HTN (hypertension)   . Irregular periods/menstrual cycles   . Mastitis   . Obesity   . Obesity   . Tobacco abuse   . Tobacco abuse     Family History  Problem Relation Age of Onset  . Cancer Mother   . Hypertension Mother   . Hypertension Father     Past Surgical History:  Procedure Laterality Date  . CHOLECYSTECTOMY  2002  . INCISION AND DRAINAGE BREAST ABSCESS     Social History   Occupational History  . Not on file.   Social History Main Topics  . Smoking status: Current Some Day Smoker    Packs/day: 0.50    Types: Cigarettes  . Smokeless tobacco: Never Used     Comment: "used to smoke 1/2 to 1ppd depending on stress"  . Alcohol use 1.8 oz/week    3 Glasses of wine per week  Comment: occassional  . Drug use: No  . Sexual activity: Yes    Birth control/ protection: Other-see comments, None     Comment: With women

## 2016-08-19 ENCOUNTER — Ambulatory Visit (HOSPITAL_COMMUNITY)
Admission: RE | Admit: 2016-08-19 | Discharge: 2016-08-19 | Disposition: A | Discharge status: Home / Self Care | Payer: PRIVATE HEALTH INSURANCE | Source: Ambulatory Visit | Attending: Family Medicine | Admitting: Family Medicine

## 2016-08-19 ENCOUNTER — Encounter: Payer: Self-pay | Admitting: Family Medicine

## 2016-08-19 ENCOUNTER — Ambulatory Visit (INDEPENDENT_AMBULATORY_CARE_PROVIDER_SITE_OTHER): Payer: PRIVATE HEALTH INSURANCE | Admitting: Family Medicine

## 2016-08-19 VITALS — BP 142/90 | HR 76 | Temp 98.4°F | Wt 257.0 lb

## 2016-08-19 DIAGNOSIS — R0789 Other chest pain: Secondary | ICD-10-CM

## 2016-08-19 DIAGNOSIS — R0602 Shortness of breath: Secondary | ICD-10-CM | POA: Insufficient documentation

## 2016-08-19 DIAGNOSIS — J040 Acute laryngitis: Secondary | ICD-10-CM | POA: Diagnosis not present

## 2016-08-19 NOTE — Progress Notes (Signed)
    Subjective:  Claudia Wall is a 41 y.o. female who presents to the [facility] today with a chief complaint of chest heaviness.   HPI:  Started yesterday, when got up in the morning felt "funny" and sounded hoarse. Feels short of breath. Chest flutter and heaviness, worse on exertion.  No fever/chills. No cough, sore throat. Allergies have been worse than usual. Sneezing. No OTC medications.   ROS: Per HPI  Smoking history: 1/3 ppd for 25 years, then quit in October 2018.   Objective:  Physical Exam: BP (!) 142/90   Pulse 76   Temp 98.4 F (36.9 C) (Oral)   Wt 257 lb (116.6 kg)   LMP 08/05/2016   SpO2 98%   BMI 45.53 kg/m   Gen: NAD, resting comfortably HEENT: [place], AT. Oropharynx mildy erythematous, no exudates or lesions CV: RRR with no murmurs appreciated Pulm: NWOB, CTAB with no crackles, wheezes, or rhonchi GI: Normal bowel sounds present. Soft, Nontender, Nondistended. MSK: no edema, cyanosis, or clubbing noted Skin: warm, dry Neuro: grossly normal, moves all extremities Psych: Normal affect and thought content  EKG normal sinus rhythm  Assessment/Plan:  Acute laryngitis Reassuring physical exam c/w throat irritation. Patient did present with chest heaviness and possible palpitations but EKG reassuring. - Supportive care with voice rest and good hydration - Patient given return precautions.  Bufford Lope, DO PGY-1, [facility] 08/19/2016 3:12 PM

## 2016-08-19 NOTE — Patient Instructions (Signed)
It was great to see you today,  For your laryngitis, - Voice rest, good hydration. I except you should be feeling better in about 1 week   Take care and seek immediate care sooner if you develop any concerns.   Dr. Bufford Lope, DO [facility]   Hoarseness Hoarseness is any abnormal change in your voice.Hoarseness can make it difficult to speak. Your voice may sound raspy, breathy, or strained. Hoarseness is caused by a problem with the vocal cords. The vocal cords are two bands of tissue inside your voice box (larynx). When you speak, your vocal cords move back and forth to create sound. The surfaces of your vocal cords need to be smooth for your voice to sound clear. Swelling or lumps on the vocal cords can cause hoarseness. Common causes of vocal cord problems include:  Upper airway infection.  A long-term cough.  Straining or overusing your voice.  Smoking.  Allergies.  Vocal cord growths.  Stomach acids that flow up from your stomach and irritate your vocal cords (gastroesophageal reflux). Follow these instructions at home: Watch your condition for any changes. To ease any discomfort that you feel:  Rest your voice. Do not whisper. Whispering can cause muscle strain.  Do not speak in a loud or harsh voice that makes your hoarseness worse.  Do not use any tobacco products, including cigarettes, chewing tobacco, or electronic cigarettes. If you need help quitting, ask your health care provider.  Avoid secondhand smoke.  Do not eat foods that give you heartburn. Heartburn can make gastroesophageal reflux worse.  Do not drink coffee.  Do not drink alcohol.  Drink enough fluids to keep your urine clear or pale yellow.  Use a humidifier if the air in your home is dry. Contact a health care provider if:  You have hoarseness that lasts longer than 3 weeks.  You almost lose or completelylose your voice for longer than 3 days.  You have pain when  you swallow or try to talk.  You feel a lump in your neck. Get help right away if:  You have trouble swallowing.  You feel as though you are choking when you swallow.  You cough up blood or vomit blood.  You have trouble breathing. This information is not intended to replace advice given to you by your health care provider. Make sure you discuss any questions you have with your health care provider. Document Released: 02/28/2005 Document Revised: 08/23/2015 Document Reviewed: 03/08/2014 Elsevier Interactive Patient Education  2017 [employer].

## 2016-09-09 ENCOUNTER — Other Ambulatory Visit: Payer: Self-pay | Admitting: Family Medicine

## 2016-09-09 NOTE — Telephone Encounter (Signed)
Pt needs a refill on hydrochlorothiazide. Pt only has one left. Pt uses [facility] on [address]. ep

## 2016-09-10 MED ORDER — HYDROCHLOROTHIAZIDE 25 MG PO TABS: 25.0000 mg | ORAL_TABLET | Freq: Every day | ORAL | 1 refills | Status: DC

## 2016-10-22 ENCOUNTER — Encounter: Payer: Self-pay | Admitting: Family Medicine

## 2016-10-22 ENCOUNTER — Ambulatory Visit (INDEPENDENT_AMBULATORY_CARE_PROVIDER_SITE_OTHER): Payer: 59 | Admitting: Family Medicine

## 2016-10-22 VITALS — BP 130/90 | HR 74 | Temp 98.0°F | Ht 63.0 in | Wt 258.2 lb

## 2016-10-22 DIAGNOSIS — Z6841 Body Mass Index (BMI) 40.0 and over, adult: Secondary | ICD-10-CM

## 2016-10-22 DIAGNOSIS — M5442 Lumbago with sciatica, left side: Secondary | ICD-10-CM | POA: Diagnosis not present

## 2016-10-22 DIAGNOSIS — Z0000 Encounter for general adult medical examination without abnormal findings: Secondary | ICD-10-CM | POA: Diagnosis not present

## 2016-10-22 MED ORDER — METHOCARBAMOL 500 MG PO TABS: 500.0000 mg | ORAL_TABLET | Freq: Three times a day (TID) | ORAL | 0 refills | Status: DC | PRN

## 2016-10-22 MED ORDER — MELOXICAM 15 MG PO TABS: 15.0000 mg | ORAL_TABLET | Freq: Every day | ORAL | 0 refills | Status: DC

## 2016-10-22 NOTE — Assessment & Plan Note (Addendum)
Chronic. Patient motivated to lose weight. Restricted due to acute back pain though now much improved. --Recommended 150 minutes of moderate to high intensity exercise weekly --Discussed at length importance of carbohydrate restriction

## 2016-10-22 NOTE — Progress Notes (Signed)
Subjective:   Patient ID: Claudia Wall    DOB: 1975/12/27, 41 y.o. female   MRN: [id number]  CC: "back pain"  HPI: Claudia Wall is a 41 y.o. female who presents to clinic today For back pain. Problems discussed today are as follows:  Low back pain: Onset last year after Thanksgiving. Patient recalls walking and feeling a sudden sensation of shock running down her left leg. Her symptoms are intermittent and had resolved since being seen at the beginning of the year. She endorses recurrence of her lower back pain which resolved with the use of leftover Mobic from her previous office visit. She also has been taking her friend's tramadol.  ROS: Denies bowel or bladder incontinence, saddle anesthesia, fever or chills, nuchal rigidity, unintentional weight loss.  Morbid obesity: Patient states her back pain has limited her ability to exercise but is motivated. She has been making efforts to reduce calorie intake.  Preventative health: Patient overdue for Pap and mammogram. Previous Pap unremarkable.  Complete ROS performed, see HPI for pertinent.  [place]: Obesity, HTN, GERD, eczema, anxiety, tobacco use disorder. Surgical cholecystectomy, I&D of breast abscess. Family mother cancer, HTN, father HTN. Smoking status reviewed. Medications reviewed.  Objective:   BP 130/90   Pulse 74   Temp 98 F (36.7 C) (Oral)   Ht 5\' 3"  (1.6 m)   Wt 258 lb 3.2 oz (117.1 kg)   SpO2 100%   BMI 45.74 kg/m  Vitals and nursing note reviewed.  General: morbidly obese, well nourished, well developed, in no acute distress with non-toxic appearance HEENT: normocephalic, atraumatic, moist mucous membranes CV: regular rate and rhythm without murmurs, rubs, or gallops, no lower extremity edema Lungs: clear to auscultation bilaterally with normal work of breathing Skin: warm, dry, no rashes or lesions, cap refill < 2 seconds Extremities: warm and well perfused, normal tone MSK: exquisitely tender SI joints  bilaterally, stable gait, full passive ROM, no decrease in sensation, 5/5 motor function on all 4 extremities  Assessment & Plan:   Lumbago with sciatica, left side Acute on chronic. Has symptoms of sciatica on left only. Much improved with use of leftover Mobic from prior episode of back pain. 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 Compensated with morbid obesity. Could also be muscle strain due to weight. No red flags on exam. --Prescribed Mobic 15 mg daily 30 tablets no refills, Robaxin 500 mg every 8 hours 45 tabs no refills --Patient to make lifestyle modifications to lose weight --RTC 3 months  Preventative health care Overdue for tetanus booster and Pap smear. Patient states Pap smear was obtained last year. --Patient sign a release of information, we'll recheck out to obtain Pap results --Tetanus booster prescription given during last visit, patient to go receive shot  BMI 45.0-49.9, adult ([place]) Chronic. Patient motivated to lose weight. Restricted due to acute back pain though now much improved. --Recommended 150 minutes of moderate to high intensity exercise weekly --Discussed at length importance of carbohydrate restriction  No orders of the defined types were placed in this encounter.  Meds ordered this encounter  Medications  . methocarbamol (ROBAXIN) 500 MG tablet    Sig: Take 1 tablet (500 mg total) by mouth every 8 (eight) hours as needed for muscle spasms.    Dispense:  45 tablet    Refill:  0  . meloxicam (MOBIC) 15 MG tablet    Sig: Take 1 tablet (15 mg total) by mouth daily.    Dispense:  30 tablet  Refill:  0    Harriet Butte, [place], PGY-2 10/23/2016 11:43 AM

## 2016-10-22 NOTE — Patient Instructions (Addendum)
Thank you for coming in to see [name] today. Please see below to review our plan for today's visit.  Your back pain is likely related to your weight. I have given you a medication to help with the pain including a muscle relaxer and a pain reducer. Ideally we would discontinue these medications once her pain is under control. The idea behind using his heart to help to chew through the days that you can remain active and make efforts to lose weight. I recommend 150 minutes of moderate intensity exercise weekly. She is also important to understand what you eat and avoid excessive calorie intake including sugary beverages and carbohydrates in foods such as bread. I have also attached information on scheduling a screening mammogram. You're overdue for Pap smear. We can discuss next visit.  Return to clinic in 3 months.  Please call the clinic at [phone] if your symptoms worsen or you have any concerns. It was my pleasure to see you. -- Harriet Butte, [place] [employer], PGY-2

## 2016-10-22 NOTE — Assessment & Plan Note (Addendum)
Acute on chronic. Has symptoms of sciatica on left only. Much improved with use of leftover Mobic from prior episode of back pain. 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 Compensated with morbid obesity. Could also be muscle strain due to weight. No red flags on exam. --Prescribed Mobic 15 mg daily 30 tablets no refills, Robaxin 500 mg every 8 hours 45 tabs no refills --Patient to make lifestyle modifications to lose weight --RTC 3 months

## 2016-10-22 NOTE — Assessment & Plan Note (Addendum)
Overdue for tetanus booster and Pap smear. Patient states Pap smear was obtained last year. --Patient sign a release of information, we'll recheck out to obtain Pap results --Tetanus booster prescription given during last visit, patient to go receive shot

## 2016-10-27 ENCOUNTER — Ambulatory Visit (INDEPENDENT_AMBULATORY_CARE_PROVIDER_SITE_OTHER): Payer: 59 | Admitting: Psychiatry

## 2016-10-27 ENCOUNTER — Encounter (HOSPITAL_COMMUNITY): Payer: Self-pay | Admitting: Psychiatry

## 2016-10-27 VITALS — BP 136/80 | HR 79 | Ht 63.0 in | Wt 255.8 lb

## 2016-10-27 DIAGNOSIS — F4312 Post-traumatic stress disorder, chronic: Secondary | ICD-10-CM | POA: Diagnosis not present

## 2016-10-27 MED ORDER — FLUOXETINE HCL 20 MG PO CAPS: 20.0000 mg | ORAL_CAPSULE | Freq: Every day | ORAL | 2 refills | Status: DC
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
## 2016-11-05 ENCOUNTER — Other Ambulatory Visit: Payer: Self-pay | Admitting: Family Medicine

## 2016-11-05 DIAGNOSIS — M5442 Lumbago with sciatica, left side: Secondary | ICD-10-CM

## 2016-11-05 MED ORDER — METHOCARBAMOL 500 MG PO TABS: 500.0000 mg | ORAL_TABLET | Freq: Three times a day (TID) | ORAL | 0 refills | Status: DC | PRN

## 2016-11-05 MED ORDER — MELOXICAM 15 MG PO TABS: 15.0000 mg | ORAL_TABLET | Freq: Every day | ORAL | 0 refills | Status: DC

## 2016-11-05 NOTE — Telephone Encounter (Signed)
Pt states meloxicam and methocarbamol were sent to the wrong pharmacy. Pt uses [facility]. Pt would like her pharmacy on file to be changed to this as well. ep

## 2016-11-05 NOTE — Telephone Encounter (Signed)
Medications sent to the correct pharmacy per patient request.  Derl Barrow, RN

## 2016-11-11 ENCOUNTER — Ambulatory Visit (INDEPENDENT_AMBULATORY_CARE_PROVIDER_SITE_OTHER): Payer: 59 | Admitting: Licensed Clinical Social Worker

## 2016-11-11 ENCOUNTER — Encounter (HOSPITAL_COMMUNITY): Payer: Self-pay | Admitting: Licensed Clinical Social Worker

## 2016-11-11 DIAGNOSIS — F4312 Post-traumatic stress disorder, chronic: Secondary | ICD-10-CM | POA: Diagnosis not present
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
## 2016-11-25 ENCOUNTER — Other Ambulatory Visit: Payer: Self-pay | Admitting: *Deleted

## 2016-11-25 MED ORDER — HYDROCHLOROTHIAZIDE 25 MG PO TABS: 25.0000 mg | ORAL_TABLET | Freq: Every day | ORAL | 3 refills | Status: DC

## 2016-12-04 ENCOUNTER — Ambulatory Visit (INDEPENDENT_AMBULATORY_CARE_PROVIDER_SITE_OTHER): Payer: 59 | Admitting: Psychiatry

## 2016-12-04 DIAGNOSIS — F4312 Post-traumatic stress disorder, chronic: Secondary | ICD-10-CM | POA: Diagnosis not present
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
## 2016-12-11 ENCOUNTER — Ambulatory Visit (INDEPENDENT_AMBULATORY_CARE_PROVIDER_SITE_OTHER): Payer: 59 | Admitting: Psychiatry

## 2016-12-11 DIAGNOSIS — F17210 Nicotine dependence, cigarettes, uncomplicated: Secondary | ICD-10-CM

## 2016-12-11 DIAGNOSIS — F4312 Post-traumatic stress disorder, chronic: Secondary | ICD-10-CM | POA: Diagnosis not present

## 2016-12-11 DIAGNOSIS — Z630 Problems in relationship with spouse or partner: Secondary | ICD-10-CM | POA: Diagnosis not present

## 2016-12-11 NOTE — Progress Notes (Signed)
[place] MD/PA/NP OP Progress Note  12/11/2016 4:02 PM Claudia Wall  MRN:  [id number]  Chief Complaint: med check  HPI: Claudia Wall has met with and continues to work with her therapist in office, last seen on September 6.  She reports that the Prozac has been very well tolerated, and she has had a significant turnaround in her mood. She reports that her girlfriend has noted a remarkable improvement, and she reports that she and her girlfriend now has decided to stay together, because the patient has not been as "ugly" and has been much kinder in the relationship.   She reports that she has not drank any alcohol except for on one occasion since she last saw this Probation officer in July. I applauded her efforts in this and expressed how impressed I am with her. She reports that she feels more upbeat, things don't get on her nerves nearly as much, she is sleeping better, she is able to push away hopeless thoughts and suicidal thoughts with ease, and she feels optimistic for the future. She is able to enjoy her activities and hobbies including watching television series, and spending time with her girlfriend. She agrees to remain on the current dose 20 mg, but will increase to 40 mg if she starts to notice a decline in her mood.  She agrees to continue to follow up in therapy and follow-up with this Probation officer in 3 months.   Visit Diagnosis:    ICD-10-CM   1. Chronic post-traumatic stress disorder (PTSD) F43.12     Past Psychiatric History: See intake H&P for full details. Reviewed, with no updates at this time.  Past Medical History:  Past Medical History:  Diagnosis Date  . Breast abscess   . HTN (hypertension)   . Hypertension   . Irregular periods/menstrual cycles   . Mastitis   . Obesity   . Obesity   . Tobacco abuse   . Tobacco abuse     Past Surgical History:  Procedure Laterality Date  . CHOLECYSTECTOMY  2002  . INCISION AND DRAINAGE BREAST ABSCESS      Family Psychiatric History: See  intake H&P for full details. Reviewed, with no updates at this time.   Family History:  Family History  Problem Relation Age of Onset  . Cancer Mother   . Hypertension Mother   . Hypertension Father     Social History:  Social History   Social History  . Marital status: Single    Spouse name: N/A  . Number of children: N/A  . Years of education: N/A   Social History Main Topics  . Smoking status: Current Every Day Smoker    Packs/day: 0.25    Years: 25.00    Types: Cigarettes  . Smokeless tobacco: Never Used     Comment: "used to smoke 1/2 to 1ppd depending on stress"  . Alcohol use 1.8 oz/week    3 Glasses of wine per week     Comment: occassional  . Drug use: No  . Sexual activity: Yes    Birth control/ protection: Other-see comments, None     Comment: With women   Other Topics Concern  . Not on file   Social History Narrative   Is in a same sex relationship x 1 year. Very supportive and loving per pt and partner.     Allergies: No Known Allergies  Metabolic Disorder Labs: Lab Results  Component Value Date   HGBA1C 5.7 02/15/2014   No results found  for: PROLACTIN Lab Results  Component Value Date   CHOL 176 06/12/2010   TRIG 121 06/12/2010   HDL 45 06/12/2010   CHOLHDL 3.9 Ratio 06/12/2010   VLDL 24 06/12/2010   LDLCALC 107 (H) 06/12/2010   LDLCALC 107 (H) 06/12/2010   Lab Results  Component Value Date   TSH 0.96 04/08/2016    Therapeutic Level Labs: No results found for: LITHIUM No results found for: VALPROATE No components found for:  CBMZ  Current Medications: Current Outpatient Prescriptions  Medication Sig Dispense Refill  . diphenhydrAMINE (BENADRYL) 25 mg capsule Take 25 mg by mouth every 6 (six) hours as needed.    Marland Kitchen FLUoxetine (PROZAC) 20 MG capsule Take 1 capsule (20 mg total) by mouth daily. 30 capsule 2  . hydrochlorothiazide (HYDRODIURIL) 25 MG tablet Take 1 tablet (25 mg total) by mouth daily. 90 tablet 3  . ibuprofen  (ADVIL,MOTRIN) 200 MG tablet Take 600 mg by mouth every 6 (six) hours as needed for headache, mild pain or moderate pain.    . meloxicam (MOBIC) 15 MG tablet Take 1 tablet (15 mg total) by mouth daily. 30 tablet 0  . methocarbamol (ROBAXIN) 500 MG tablet Take 1 tablet (500 mg total) by mouth every 8 (eight) hours as needed for muscle spasms. 45 tablet 0   No current facility-administered medications for this visit.      Musculoskeletal: Strength & Muscle Tone: within normal limits Gait & Station: normal Patient leans: N/A  Psychiatric Specialty Exam: ROS  There were no vitals taken for this visit.There is no height or weight on file to calculate BMI.  General Appearance: Casual and Fairly Groomed  Eye Contact:  Fair  Speech:  Clear and Coherent  Volume:  Normal  Mood:  Euthymic  Affect:  Appropriate and Congruent  Thought Process:  Goal Directed  Orientation:  Full (Time, Place, and Person)  Thought Content: Logical   Suicidal Thoughts:  No  Homicidal Thoughts:  No  Memory:  Immediate;   Fair  Judgement:  Fair  Insight:  Fair  Psychomotor Activity:  Normal  Concentration:  Concentration: Good  Recall:  Good  Fund of Knowledge: Good  Language: Good  Akathisia:  Negative  Handed:  Right  AIMS (if indicated): not done  Assets:  Communication Skills Desire for Improvement Financial Resources/Insurance Housing Transportation Vocational/Educational  ADL's:  Intact  Cognition: WNL  Sleep:  Fair   Screenings: PHQ2-9     Office Visit from 10/22/2016 in [place] Office Visit from 08/19/2016 in [place] Office Visit from 02/29/2016 in [place] Office Visit from 01/18/2016 in [place] Office Visit from 07/19/2015 in [place]  PHQ-2 Total Score  0  0  0  0  0  PHQ-9 Total Score  -  -  -  0  -      Assessment and Plan: Claudia Wall is a 41 year old  female with chronic PTSD and alcohol use disorder.She presents today for follow-up visit. She has been on Prozac for approximately 6 weeks, and reports a significant improvement in her mood and sleep symptoms.  This has positively affected her relationships, and she has had improved outlook for the future. She does not have any acute safety issues and has dramatically reduced her alcohol use.  I suspect a large part of her mood improvements are due primarily to her abstinence from alcohol, with additional benefits seen  from Prozac. We will follow-up in 2-3 months or sooner if needed.  1. Chronic post-traumatic stress disorder (PTSD)    Continue Prozac 20 mg daily; okay to increase to 40 mg if she notices a worsening of mood RTC 2-3 months Continue in individual therapy   Aundra Dubin, MD 12/11/2016, 4:02 PM

## 2017-01-19 ENCOUNTER — Ambulatory Visit (INDEPENDENT_AMBULATORY_CARE_PROVIDER_SITE_OTHER): Payer: 59 | Admitting: Psychiatry

## 2017-01-19 DIAGNOSIS — F4312 Post-traumatic stress disorder, chronic: Secondary | ICD-10-CM | POA: Diagnosis not present

## 2017-01-26 ENCOUNTER — Other Ambulatory Visit (HOSPITAL_COMMUNITY): Payer: Self-pay

## 2017-01-26 DIAGNOSIS — F4312 Post-traumatic stress disorder, chronic: Secondary | ICD-10-CM

## 2017-01-26 MED ORDER — FLUOXETINE HCL 20 MG PO CAPS: 40.0000 mg | ORAL_CAPSULE | Freq: Every day | ORAL | 2 refills | Status: DC

## 2017-02-09 ENCOUNTER — Ambulatory Visit (HOSPITAL_COMMUNITY): Payer: Self-pay | Admitting: Psychiatry

## 2017-02-09 ENCOUNTER — Telehealth: Payer: Self-pay | Admitting: Family Medicine

## 2017-02-09 NOTE — Telephone Encounter (Signed)
Pt called and said her dentist put her on amoxicillin and she says she now has a UTI. Pt would like a rx called in treat this. She comes in 11/14 to get other things checked out. Please advise

## 2017-02-10 ENCOUNTER — Other Ambulatory Visit: Payer: Self-pay | Admitting: Family Medicine

## 2017-02-10 MED ORDER — METRONIDAZOLE 500 MG PO TABS: 500.0000 mg | ORAL_TABLET | Freq: Two times a day (BID) | ORAL | 0 refills | Status: DC

## 2017-02-10 NOTE — Telephone Encounter (Signed)
Sent Rx for flagyl 500 mg BID for 7 days for suspected BV. Has h/o BV. Patient still needs to f/u with appt. Thanks.

## 2017-02-10 NOTE — Telephone Encounter (Signed)
Pt called again about her yeast infection. She doesn't want to wait until tomorrow to get the prescription. She is miserable

## 2017-02-11 ENCOUNTER — Ambulatory Visit (INDEPENDENT_AMBULATORY_CARE_PROVIDER_SITE_OTHER): Payer: 59 | Admitting: Family Medicine

## 2017-02-11 ENCOUNTER — Other Ambulatory Visit (HOSPITAL_COMMUNITY)
Admission: RE | Admit: 2017-02-11 | Discharge: 2017-02-11 | Disposition: A | Discharge status: Home / Self Care | Payer: 59 | Source: Ambulatory Visit | Attending: Family Medicine | Admitting: Family Medicine

## 2017-02-11 VITALS — BP 119/80 | HR 81 | Temp 98.5°F | Wt 248.0 lb

## 2017-02-11 DIAGNOSIS — N898 Other specified noninflammatory disorders of vagina: Secondary | ICD-10-CM | POA: Diagnosis present

## 2017-02-11 DIAGNOSIS — N926 Irregular menstruation, unspecified: Secondary | ICD-10-CM

## 2017-02-11 LAB — POCT WET PREP (WET MOUNT)
CLUE CELLS WET PREP WHIFF POC: NEGATIVE
Trichomonas Wet Prep HPF POC: ABSENT
WBC, Wet Prep HPF POC: >20

## 2017-02-11 LAB — POCT URINE PREGNANCY: Preg Test, Ur: NEGATIVE

## 2017-02-11 MED ORDER — FLUCONAZOLE 150 MG PO TABS: 150.0000 mg | ORAL_TABLET | Freq: Once | ORAL | 0 refills | Status: AC

## 2017-02-11 NOTE — Patient Instructions (Addendum)
It was good to see you today!  For your irregular periods,  - We are checking some labs today. If results require attention, either myself or my nurse will get in touch with you. If everything is normal, you will get a letter in the mail or a message in My Chart. Please give [name] a call if you do not hear from [name] after 2 weeks.  For your vaginal itching - Diflucan once to treat for a possible undetected yeast infection.    Please check-out at the front desk before leaving the clinic. Make an appointment with your PCP to discuss your sleep issues.  Please bring all of your medications with you to each visit.   Sign up for My Chart to have easy access to your labs results, and communication with your primary care physician.  Feel free to call with any questions or concerns at any time, at [phone].   Take care,  Dr. Bufford Lope, [place]

## 2017-02-11 NOTE — Telephone Encounter (Signed)
LMOVM for pt to call us back. Please see message below. Deseree Kennon Holter, CMA

## 2017-02-11 NOTE — Progress Notes (Signed)
    Subjective:  Claudia Wall is a 41 y.o. female who presents to the [facility] today with a chief complaint of irregular periods.   HPI:  Irregular periods - Has had irregular periods for most of her life - recalls in 2006 that she did not have any periods for almost a whole year. Does not recall if she had any spotting - Recently has been more irregular than usual and now having hot flashes - concerned about early menopause because she does not know if runs in her family. Mother had full hysterectomy at <age 83  - feels like got worse with some weight gain over recent years - has very painful cramping with her periods now  Vaginal itching - no abnormal vaginal discharge but does feel very itchy. Started after completing course of amoxicillin. Was given po metronidazole at that time which she was surprised as that has not worked well for her in the past and she had wanted diflucan - has been sexually active with the same female partner for years, no concern for STD exposure - has noticed a painful small bump on the outside of her vagina that she has been putting warm compresses on and seem to be resolving now - no fever/chills - no purulent drainage or foul odor   ROS: Per HPI  Objective:  Physical Exam: BP 119/80   Pulse 81   Temp 98.5 F (36.9 C)   Wt 248 lb (112.5 kg)   SpO2 99%   BMI 43.93 kg/m   Gen: NAD, resting comfortably GI: Soft, Nontender, Nondistended. Pelvic exam: small tender mildly erythematous area at 4'oclock at external female genitalia. normal vagina, cervix, uterus and adnexa. No CMT MSK: no edema, cyanosis, or clubbing noted Skin: warm, dry Neuro: grossly normal, moves all extremities Psych: Normal affect and thought content  Results for orders placed or performed in visit on 02/11/17 (from the past 72 hour(s))  POCT urine pregnancy     Status: None   Collection Time: 02/11/17  2:43 PM  Result Value Ref Range   Preg Test, Ur Negative Negative  POCT  Wet Prep [facility])     Status: None   Collection Time: 02/11/17  2:43 PM  Result Value Ref Range   Source Wet Prep POC VAG    WBC, Wet Prep HPF POC >20    Bacteria Wet Prep HPF POC Few Few   Clue Cells Wet Prep HPF POC None None   Clue Cells Wet Prep Whiff POC Negative Whiff    Yeast Wet Prep HPF POC None    Trichomonas Wet Prep HPF POC Absent Absent    Assessment/Plan:  Irregular periods/menstrual cycles Patient asking to be checked for menopause so will check labs today including FSH, estradiol, TSH, PRL. More likely is PCOS given patient weight. Upreg negative today.  Vaginal itching Bartholin gland cyst seen on exam today that per history appears to be self resolving. Wet prep negative but given patient's symptoms of intense itching after antibiotics, will treat with diflucan x1. GC/CT sent today off of pap smear collected today.   Patient had multiple other concerns including difficulty sleeping and weight that I advised she make an appointment with her PCP to discuss.   Bufford Lope, DO PGY-2, [facility] 02/11/2017 2:09 PM

## 2017-02-12 DIAGNOSIS — N898 Other specified noninflammatory disorders of vagina: Secondary | ICD-10-CM | POA: Insufficient documentation

## 2017-02-12 DIAGNOSIS — N926 Irregular menstruation, unspecified: Secondary | ICD-10-CM | POA: Insufficient documentation

## 2017-02-12 LAB — TESTOSTERONE: TESTOSTERONE: 54 ng/dL — AB (ref 8–48)

## 2017-02-12 LAB — ESTRADIOL: ESTRADIOL: 42.5 pg/mL

## 2017-02-12 LAB — CERVICOVAGINAL ANCILLARY ONLY
Chlamydia: NEGATIVE
NEISSERIA GONORRHEA: NEGATIVE

## 2017-02-12 LAB — PROLACTIN: PROLACTIN: 10.6 ng/mL (ref 4.8–23.3)

## 2017-02-12 LAB — FOLLICLE STIMULATING HORMONE: FSH: 8.5 m[IU]/mL

## 2017-02-12 LAB — TSH: TSH: 1.02 u[IU]/mL (ref 0.450–4.500)

## 2017-02-12 NOTE — Assessment & Plan Note (Signed)
Bartholin gland cyst seen on exam today that per history appears to be self resolving. Wet prep negative but given patient's symptoms of intense itching after antibiotics, will treat with diflucan x1. GC/CT sent today off of pap smear collected today.

## 2017-02-12 NOTE — Telephone Encounter (Signed)
Pt informed. Claudia Wall, CMA  

## 2017-02-12 NOTE — Assessment & Plan Note (Signed)
Patient asking to be checked for menopause so will check labs today including FSH, estradiol, TSH, PRL. More likely is PCOS given patient weight. Upreg negative today.

## 2017-02-13 ENCOUNTER — Encounter: Payer: Self-pay | Admitting: Family Medicine

## 2017-02-13 LAB — CYTOLOGY - PAP
Diagnosis: NEGATIVE
HPV: DETECTED — AB

## 2017-02-16 ENCOUNTER — Telehealth: Payer: Self-pay | Admitting: Family Medicine

## 2017-02-16 NOTE — Telephone Encounter (Signed)
Discussed lab result with patient that does not show menopause but that elevated testosterone could be c/w PCOS. Discussed that could check 17-hydroxyprogesterone or do ultrasound. Also discussed possibility of OCPs for cycle regulation and endometrial protection. Patient stated she preferred to come back in for appointment and give additional bloodwork. Agreed to discuss further at that time.

## 2017-02-24 ENCOUNTER — Ambulatory Visit (INDEPENDENT_AMBULATORY_CARE_PROVIDER_SITE_OTHER): Payer: 59 | Admitting: Psychiatry

## 2017-02-24 DIAGNOSIS — F4310 Post-traumatic stress disorder, unspecified: Secondary | ICD-10-CM

## 2017-02-24 DIAGNOSIS — F4312 Post-traumatic stress disorder, chronic: Secondary | ICD-10-CM
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
## 2017-02-27 NOTE — Progress Notes (Signed)
   THERAPIST PROGRESS NOTE   Session Time:3:05-4:00  Participation Level:Active  Behavioral Response:CasualAlertDepressed  Type of Therapy:Individual Therapy  Treatment Goals addressed:Coping  Interventions:CBT  Summary:Claudia N Settleis a 41 y.o.femalewho presents with Mood Disorder.   Suicidal/Homicidal:Nowithout intent/plan  Therapist Response:Pt. Continues to present as talkative, engaged in therapeutic process. Pt. Discussed recent events of Thanksgiving. Pt. Discussed that generally that she enjoyed time with her children and girlfriend. Pt. Discussed that her girlfriend accused her of drinking too much which she did not remember. Pt. Discussed that she went to see her mother who is living mostly on the streets and is addicted to drugs. Pt. reports that the interaction made her feel sad and angry because her mother did not recognize her at first. Pt. Discussed that she continues to see her mother on holidays out of guilt and obligation. She too her to a fast food restaurant and got her some food, but no meaningful interaction. Pt. Also discussed going bowling with her adult children and her girlfriend and her daughter becoming frustrating by her girlfriend's "picking" at her. Significant time was spent processing her observation of the poor boundaries between her daughter and her girlfriend and how she might model healthier boundaries and how to be more assertive in her relationships for her daughter. Pt. Inquired how she might begin to go about this. Counselor suggested that she continue her work around self-care and learning to prioritize her care for herself. Pt. Discussed that this had always been difficult for her due to thoughts of self-indulgence and feelings of guilt. Counselor discussed that guilt in this instance was a learned emotion and that her work was to be present with the guilt and to continue to engage in the self-care until the guilt became less of a  barrier. Pt. Was encouraged to engage in this exercise of "self-care diet" for the next 30 days and to notice if it became easier to advocate for her needs.  Diagnosis: PTSD  Plan: Return for supportive, CBT. Next session in 30 days.   Nancie Neas, [facility] 02/27/2017

## 2017-03-09 ENCOUNTER — Ambulatory Visit (HOSPITAL_COMMUNITY): Payer: Self-pay | Admitting: Psychiatry

## 2017-04-06 ENCOUNTER — Ambulatory Visit (INDEPENDENT_AMBULATORY_CARE_PROVIDER_SITE_OTHER): Payer: 59 | Admitting: Psychiatry

## 2017-04-06 DIAGNOSIS — F4312 Post-traumatic stress disorder, chronic: Secondary | ICD-10-CM

## 2017-04-10 NOTE — Progress Notes (Signed)
   THERAPIST PROGRESS NOTE  Time: 3:05-4:00  Participation Level:Active  Behavioral Response:CasualAlertEuthymic  Type of Therapy:Individual Therapy  Treatment Goals addressed:Coping  Interventions:CBT  Summary:Claudia Wall a 42 y.o.femalewho presents with Mood Disorder.   Suicidal/Homicidal:Nowithout intent/plan  Therapist Response:Pt.Continues to presentas talkative, engaged in therapeutic process. Pt. Discussed recent events of the Christmas holidays with her girlfriend and children. Pt. Discussed that things were not going well with her girlfriend and that she was making plans to move out of the house. Pt. Discussed that her emotional and physical needs were not being met and that she had tried for the last ten months to communicate this to her and that she was not being responsive. Pt. Discussed that she was frequently referred to as her "sugar momma" and issue of how she was showing up in the relationship financially and her discomfort with financial intimacy were discussed. Pt. Discussed her desire to take care of her partner financially and that she feels uncomfortable when she is not able to do so, to the point that she will do without or feel financially burdened. Pt. Discussed her history in other relationships allowing herself to take care of others rather than having the relationship meet both of their needs. Session focused on introduction of four love languages and how she and her current partner might discuss their preferences in getting their needs met so that the relationship can be more mutually satisfying.  Diagnosis: PTSD; MDD  Plan: Return for supportive, CBT. Next session in 30 days.  Nancie Neas, [facility] 04/10/2017

## 2017-04-16 ENCOUNTER — Encounter (INDEPENDENT_AMBULATORY_CARE_PROVIDER_SITE_OTHER): Payer: 59

## 2017-04-20 ENCOUNTER — Ambulatory Visit (HOSPITAL_COMMUNITY): Payer: Self-pay | Admitting: Psychiatry

## 2017-04-22 ENCOUNTER — Ambulatory Visit (INDEPENDENT_AMBULATORY_CARE_PROVIDER_SITE_OTHER): Payer: 59 | Admitting: Psychiatry

## 2017-04-22 DIAGNOSIS — F329 Major depressive disorder, single episode, unspecified: Secondary | ICD-10-CM

## 2017-04-22 DIAGNOSIS — F4310 Post-traumatic stress disorder, unspecified: Secondary | ICD-10-CM

## 2017-04-22 DIAGNOSIS — F4312 Post-traumatic stress disorder, chronic: Secondary | ICD-10-CM

## 2017-04-29 ENCOUNTER — Encounter (INDEPENDENT_AMBULATORY_CARE_PROVIDER_SITE_OTHER): Payer: Self-pay | Admitting: Family Medicine

## 2017-04-29 ENCOUNTER — Ambulatory Visit (INDEPENDENT_AMBULATORY_CARE_PROVIDER_SITE_OTHER): Payer: 59 | Admitting: Family Medicine

## 2017-04-29 VITALS — BP 138/87 | HR 90 | Temp 98.1°F | Ht 63.0 in | Wt 243.0 lb

## 2017-04-29 DIAGNOSIS — Z6841 Body Mass Index (BMI) 40.0 and over, adult: Secondary | ICD-10-CM

## 2017-04-29 DIAGNOSIS — E282 Polycystic ovarian syndrome: Secondary | ICD-10-CM

## 2017-04-29 DIAGNOSIS — R5383 Other fatigue: Secondary | ICD-10-CM | POA: Diagnosis not present

## 2017-04-29 DIAGNOSIS — Z1331 Encounter for screening for depression: Secondary | ICD-10-CM

## 2017-04-29 DIAGNOSIS — F1010 Alcohol abuse, uncomplicated: Secondary | ICD-10-CM

## 2017-04-29 DIAGNOSIS — R0602 Shortness of breath: Secondary | ICD-10-CM

## 2017-04-29 DIAGNOSIS — Z9189 Other specified personal risk factors, not elsewhere classified: Secondary | ICD-10-CM | POA: Diagnosis not present

## 2017-04-29 DIAGNOSIS — Z0289 Encounter for other administrative examinations: Secondary | ICD-10-CM

## 2017-04-29 DIAGNOSIS — E049 Nontoxic goiter, unspecified: Secondary | ICD-10-CM

## 2017-04-29 DIAGNOSIS — F1020 Alcohol dependence, uncomplicated: Secondary | ICD-10-CM | POA: Insufficient documentation

## 2017-04-29 NOTE — Progress Notes (Signed)
.  Office: [phone]  /  Fax: [phone]   HPI:   Chief Complaint: OBESITY  Claudia Wall (MR# [id number]) is a 42 y.o. female who presents on 04/29/2017 for obesity evaluation and treatment. Current BMI is Body mass index is 43.05 kg/m.Marland Kitchen Meshia has struggled with obesity for years and has been unsuccessful in either losing weight or maintaining long term weight loss. Zenith attended our information session and states she is currently in the action stage of change and ready to dedicate time achieving and maintaining a healthier weight.  Kelcy states her desired weight loss is 70 lbs she has been heavy most of  her life she started gaining weight around 79 or 42 yrs old her heaviest weight ever was 278 lbs. she has significant food cravings issues  she snacks frequently in the evenings she skips meals frequently she is frequently drinking liquids with calories she frequently makes poor food choices she frequently eats larger portions than normal  she has binge eating behaviors she struggles with emotional eating  Kensie heard about our clinic through her counselor at behavioral health. She is about to separate from her significant other. Clarie admits to emotional eating.   Fatigue Trinh feels her energy is lower than it should be. This has worsened with weight gain and has not worsened recently. Lenell admits to daytime somnolence and  admits to waking up still tired. Patient is at risk for obstructive sleep apnea. Patent has a history of symptoms of daytime fatigue, morning fatigue and morning headache. Patient generally gets 4 or 5 hours of sleep per night, and states they generally have restless sleep. Snoring is present. Apneic episodes are present. Epworth Sleepiness Score is 7 Gelene has poor sleep quality. We discussed follow up with PCP.  Dyspnea on exertion Aloha notes increasing shortness of breath with exercising and seems to be worsening over time with weight gain. She  notes getting out of breath sooner with activity than she used to. This has not gotten worse recently. Her EKG was within normal limits. Ellamay denies orthopnea.  ETOHism Oluwasemilore does binge drinking about 2 times per month. She has a history of binge drinking numerous times per week.  PCOS (polycystic ovarian syndrome) Mikaylah has a diagnosis of polycystic ovarian syndrome and is at risk of insulin resistance. She has a family history of diabetes.  Enlarged Thyroid Jakia has slight thyromegaly. She has no polyps and denies hot or cold intolerance. She has no nodules on exam and Sidnee has no history of hypothyroidism or hyperthyroidism.  At risk for diabetes Carmine is at higher than average risk for developing diabetes due to her obesity. She currently denies polyuria or polydipsia.  Depression Screen Heddy's Food and Mood (modified PHQ-9) score was  Depression screen PHQ 2/9 04/29/2017  Decreased Interest 1  Down, Depressed, Hopeless 3  PHQ - 2 Score 4  Altered sleeping 3  Tired, decreased energy 3  Change in appetite 3  Feeling bad or failure about yourself  2  Trouble concentrating 3  Moving slowly or fidgety/restless 0  Suicidal thoughts 1  PHQ-9 Score 19  Difficult doing work/chores Somewhat difficult    ALLERGIES: No Known Allergies  MEDICATIONS: Current Outpatient Medications on File Prior to Visit  Medication Sig Dispense Refill  . diphenhydrAMINE (BENADRYL) 25 mg capsule Take 25 mg by mouth at bedtime as needed.     Marland Kitchen FLUoxetine (PROZAC) 20 MG capsule Take 2 capsules (40 mg total) by mouth daily. 60 capsule 2  .  hydrochlorothiazide (HYDRODIURIL) 25 MG tablet Take 1 tablet (25 mg total) by mouth daily. 90 tablet 3  . ibuprofen (ADVIL,MOTRIN) 200 MG tablet Take 600 mg by mouth every 6 (six) hours as needed for headache, mild pain or moderate pain.    . Melatonin 10 MG TABS Take 20-30 mg by mouth at bedtime.    . meloxicam (MOBIC) 15 MG tablet Take 1 tablet (15 mg  total) by mouth daily. 30 tablet 0  . methocarbamol (ROBAXIN) 500 MG tablet Take 1 tablet (500 mg total) by mouth every 8 (eight) hours as needed for muscle spasms. 45 tablet 0  . naproxen sodium (ALEVE) 220 MG tablet Take 220 mg by mouth daily as needed.     No current facility-administered medications on file prior to visit.     PAST MEDICAL HISTORY: Past Medical History:  Diagnosis Date  . Alcohol abuse   . Anxiety   . Back pain   . Breast abscess   . Constipation   . Depression   . Gallbladder problem   . GERD (gastroesophageal reflux disease)   . HTN (hypertension)   . Hypertension   . Irregular periods/menstrual cycles   . Joint pain   . Lactose intolerance   . Leg edema   . Mastitis   . Obesity   . Obesity   . PCOS (polycystic ovarian syndrome)   . PTSD (post-traumatic stress disorder)   . Swallowing difficulty   . Tobacco abuse   . Tobacco abuse     PAST SURGICAL HISTORY: Past Surgical History:  Procedure Laterality Date  . CHOLECYSTECTOMY  2002  . INCISION AND DRAINAGE BREAST ABSCESS      SOCIAL HISTORY: Social History   Tobacco Use  . Smoking status: Current Every Day Smoker    Packs/day: 0.25    Years: 25.00    Pack years: 6.25    Types: Cigarettes  . Smokeless tobacco: Never Used  . Tobacco comment: "used to smoke 1/2 to 1ppd depending on stress"  Substance Use Topics  . Alcohol use: Yes    Alcohol/week: 1.8 oz    Types: 3 Glasses of wine per week    Comment: occassional  . Drug use: No    FAMILY HISTORY: Family History  Problem Relation Age of Onset  . Cancer Mother   . Hypertension Mother   . Heart disease Mother   . Depression Mother   . Bipolar disorder Mother   . Schizophrenia Mother   . Alcoholism Mother   . Drug abuse Mother   . Obesity Mother   . Hypertension Father   . Diabetes Father     ROS: Review of Systems  Constitutional: Positive for malaise/fatigue.       Breasts Pain Breasts Lumps  HENT: Positive for  congestion (nasal stuffiness) and tinnitus.        Difficult or Painful Swallowing Hoarseness  Eyes:       Wear Contacts or Glasses Flashes of Light Floaters  Respiratory: Positive for shortness of breath (on exertion).   Cardiovascular: Positive for palpitations.  Gastrointestinal: Positive for diarrhea and heartburn.       Swallowing difficulty  Musculoskeletal: Positive for back pain and neck pain.       Neck Stiffness Muscle or Joint Pain Muscle Stiffness  Skin: Positive for itching.       dryness  Neurological: Positive for dizziness, weakness and headaches.  Endo/Heme/Allergies:       Heat/Cold Intolerance Excessive Hunger  Psychiatric/Behavioral: Positive for depression.  The patient is nervous/anxious (nervousness) and has insomnia.        Stress    PHYSICAL EXAM: Blood pressure 138/87, pulse 90, temperature 98.1 F (36.7 C), temperature source Oral, height 5\' 3"  (1.6 m), weight 243 lb (110.2 kg), last menstrual period 04/25/2017, SpO2 100 %. Body mass index is 43.05 kg/m. Physical Exam  Constitutional: She is oriented to person, place, and time. She appears well-developed and well-nourished.  HENT:  Head: Normocephalic and atraumatic.  Nose: Nose normal.  Eyes: EOM are normal. No scleral icterus.  Neck: Normal range of motion. Neck supple. Thyromegaly (slight) present.  Cardiovascular: Normal rate and regular rhythm.  Pulmonary/Chest: Effort normal. No respiratory distress.  Abdominal: Soft. There is no tenderness.  + obesity  Musculoskeletal: Normal range of motion.  Range of Motion normal in all 4 extremities  Neurological: She is alert and oriented to person, place, and time. Coordination normal.  Skin: Skin is warm and dry.  Positive acanthosis nigricans  Psychiatric: She has a normal mood and affect. Her behavior is normal.  Vitals reviewed.   RECENT LABS AND TESTS: BMET    Component Value Date/Time   NA 139 05/12/2016 1510   K 3.9 05/12/2016 1510     CL 107 05/12/2016 1510   CO2 24 05/12/2016 1510   GLUCOSE 112 (H) 05/12/2016 1510   BUN 13 05/12/2016 1510   CREATININE 0.90 05/12/2016 1510   CALCIUM 9.5 05/12/2016 1510   GFRNONAA 80 05/12/2016 1510   GFRAA >89 05/12/2016 1510   Lab Results  Component Value Date   HGBA1C 5.7 02/15/2014   No results found for: INSULIN CBC    Component Value Date/Time   WBC 6.0 05/12/2016 1510   RBC 4.37 05/12/2016 1510   HGB 12.9 05/12/2016 1510   HCT 38.9 05/12/2016 1510   PLT 334 05/12/2016 1510   MCV 89.0 05/12/2016 1510   MCH 29.5 05/12/2016 1510   MCHC 33.2 05/12/2016 1510   RDW 13.9 05/12/2016 1510   LYMPHSABS 3,360 05/12/2016 1510   MONOABS 540 05/12/2016 1510   EOSABS 60 05/12/2016 1510   BASOSABS 0 05/12/2016 1510   Iron/TIBC/Ferritin/ %Sat No results found for: IRON, TIBC, FERRITIN, IRONPCTSAT Lipid Panel     Component Value Date/Time   CHOL 176 06/12/2010 1723   TRIG 121 06/12/2010 1723   HDL 45 06/12/2010 1723   CHOLHDL 3.9 Ratio 06/12/2010 1723   VLDL 24 06/12/2010 1723   LDLCALC 107 (H) 06/12/2010 1723   Hepatic Function Panel     Component Value Date/Time   PROT 7.4 11/22/2011 0510   ALBUMIN 3.9 11/22/2011 0510   AST 22 11/22/2011 0510   ALT 26 11/22/2011 0510   ALKPHOS 74 11/22/2011 0510   BILITOT 0.2 (L) 11/22/2011 0510      Component Value Date/Time   TSH 1.020 02/11/2017 1522   Vitamin D There are no recent labs  ECG  shows NSR with a rate of 64 BPM INDIRECT CALORIMETER done today shows a VO2 of 315 and a REE of 2191. Her calculated basal metabolic rate is 6160 thus her basal metabolic rate is better than expected.    ASSESSMENT AND PLAN: Other fatigue - Plan: EKG 12-Lead, Vitamin B12, CBC With Differential, Comprehensive metabolic panel, Folate, Hemoglobin A1c, Lipid Panel With LDL/HDL Ratio, VITAMIN D 25 Hydroxy (Vit-D Deficiency, Fractures)  Shortness of breath on exertion - Plan: CBC With Differential  Enlarged thyroid - Plan: T3, T4,  free, TSH  ETOHism (HCC)  PCOS (polycystic ovarian  syndrome) - Plan: Insulin, random  At risk for diabetes mellitus  Depression screening  Class 3 severe obesity with serious comorbidity and body mass index (BMI) of 40.0 to 44.9 in adult, unspecified obesity type ([place])  PLAN:  Fatigue Rondell was informed that her fatigue may be related to obesity, depression or many other causes. We will check thyroid panel, CMP, vitamin D and Indirect Calorimetry. In the meanwhile Kendle has agreed to work on diet, exercise and weight loss to help with fatigue. Proper sleep hygiene was discussed including the need for 7-8 hours of quality sleep each night. A sleep study was not ordered based on symptoms and Epworth score.  Dyspnea on exertion Kenyana's shortness of breath appears to be obesity related and exercise induced. She has agreed to work on weight loss and gradually increase exercise to treat her exercise induced shortness of breath. If Kirandeep follows our instructions and loses weight without improvement of her shortness of breath, we will plan to refer to pulmonology. We will order EKG and CBC and will monitor this condition regularly. Chizuko agrees to this plan.  ETOHism We encouraged cessation of ETOH. Keyatta is seeing a Social worker at behavioral health. She agrees to follow up with our clinic in 2 weeks.  PCOS (polycystic ovarian syndrome) We will check insulin level and Brittany is to return to her PCP for further workup as stated in note from PCP in November 2018. She agrees to follow up with our clinic in 2 weeks.  Enlarged Thyroid We will check thyroid panel and Rosario agrees to follow up with our clinic in 2 weeks.  Diabetes risk counseling Brennan was given extended (15 minutes) diabetes prevention counseling today. She is 42 y.o. female and has risk factors for diabetes including obesity. We discussed intensive lifestyle modifications today with an emphasis on weight loss as well as  increasing exercise and decreasing simple carbohydrates in her diet.  Depression Screen Liyanna had a strongly positive depression screening. Depression is commonly associated with obesity and often results in emotional eating behaviors. We will monitor this closely and work on CBT to help improve the non-hunger eating patterns. Referral to Psychology may be required if no improvement is seen as she continues in our clinic.  Obesity Savaya is currently in the action stage of change and her goal is to continue with weight loss efforts She has agreed to follow the Category 3 plan +200 calories Malika has been instructed to work up to a goal of 150 minutes of combined cardio and strengthening exercise per week for weight loss and overall health benefits. We discussed the following Behavioral Modification Strategies today: increasing lean protein intake and decrease eating out  Peachie has agreed to follow up with our clinic in 2 weeks. She was informed of the importance of frequent follow up visits to maximize her success with intensive lifestyle modifications for her multiple health conditions. She was informed we would discuss her lab results at her next visit unless there is a critical issue that needs to be addressed sooner. Solenne agreed to keep her next visit at the agreed upon time to discuss these results.    OBESITY BEHAVIORAL INTERVENTION VISIT  Today's visit was # 1 out of 22.  Starting weight: 243 lbs Starting date: 04/29/17 Today's weight : 243 lbs  Today's date: 04/29/2017 Total lbs lost to date: 0 (Patients must lose 7 lbs in the first 6 months to continue with counseling)   ASK: We discussed the diagnosis of obesity  with Augustina Mood today and Joyanna agreed to give [name] permission to discuss obesity behavioral modification therapy today.  ASSESS: Fredrika has the diagnosis of obesity and her BMI today is 43.06 Shelle is in the action stage of change   ADVISE: Jaymes was  educated on the multiple health risks of obesity as well as the benefit of weight loss to improve her health. She was advised of the need for long term treatment and the importance of lifestyle modifications.  AGREE: Multiple dietary modification options and treatment options were discussed and  Anavey agreed to the above obesity treatment plan.   I, Doreene Nest, am acting as transcriptionist for Dennard Nip, MD  I have reviewed the above documentation for accuracy and completeness, and I agree with the above. -Dennard Nip, MD

## 2017-04-30 ENCOUNTER — Telehealth: Payer: Self-pay | Admitting: Family Medicine

## 2017-04-30 LAB — CBC WITH DIFFERENTIAL
Basophils Absolute: 0 10*3/uL (ref 0.0–0.2)
Basos: 0 %
EOS (ABSOLUTE): 0.1 10*3/uL (ref 0.0–0.4)
Eos: 1 %
Hematocrit: 40 % (ref 34.0–46.6)
Hemoglobin: 13 g/dL (ref 11.1–15.9)
Immature Grans (Abs): 0 10*3/uL (ref 0.0–0.1)
Immature Granulocytes: 0 %
Lymphocytes Absolute: 3.6 10*3/uL — ABNORMAL HIGH (ref 0.7–3.1)
Lymphs: 51 %
MCH: 29.8 pg (ref 26.6–33.0)
MCHC: 32.5 g/dL (ref 31.5–35.7)
MCV: 92 fL (ref 79–97)
MONOS ABS: 0.4 10*3/uL (ref 0.1–0.9)
Monocytes: 6 %
NEUTROS PCT: 42 %
Neutrophils Absolute: 3 10*3/uL (ref 1.4–7.0)
RBC: 4.36 x10E6/uL (ref 3.77–5.28)
RDW: 13.7 % (ref 12.3–15.4)
WBC: 7.1 10*3/uL (ref 3.4–10.8)

## 2017-04-30 LAB — HEMOGLOBIN A1C
ESTIMATED AVERAGE GLUCOSE: 120 mg/dL
HEMOGLOBIN A1C: 5.8 % — AB (ref 4.8–5.6)

## 2017-04-30 LAB — COMPREHENSIVE METABOLIC PANEL
A/G RATIO: 1.3 (ref 1.2–2.2)
ALT: 28 IU/L (ref 0–32)
AST: 20 IU/L (ref 0–40)
Albumin: 4.3 g/dL (ref 3.5–5.5)
Alkaline Phosphatase: 86 IU/L (ref 39–117)
BUN/Creatinine Ratio: 11 (ref 9–23)
BUN: 10 mg/dL (ref 6–24)
Bilirubin Total: 0.3 mg/dL (ref 0.0–1.2)
CALCIUM: 9.7 mg/dL (ref 8.7–10.2)
CO2: 26 mmol/L (ref 20–29)
Chloride: 98 mmol/L (ref 96–106)
Creatinine, Ser: 0.92 mg/dL (ref 0.57–1.00)
GFR calc Af Amer: 89 mL/min/{1.73_m2} (ref >59)
GFR, EST NON AFRICAN AMERICAN: 78 mL/min/{1.73_m2} (ref >59)
Globulin, Total: 3.2 g/dL (ref 1.5–4.5)
Glucose: 83 mg/dL (ref 65–99)
POTASSIUM: 4.3 mmol/L (ref 3.5–5.2)
Sodium: 137 mmol/L (ref 134–144)
Total Protein: 7.5 g/dL (ref 6.0–8.5)

## 2017-04-30 LAB — LIPID PANEL WITH LDL/HDL RATIO
Cholesterol, Total: 193 mg/dL (ref 100–199)
HDL: 54 mg/dL (ref >39)
LDL Calculated: 122 mg/dL — ABNORMAL HIGH (ref 0–99)
LDL/HDL RATIO: 2.3 ratio (ref 0.0–3.2)
TRIGLYCERIDES: 83 mg/dL (ref 0–149)
VLDL Cholesterol Cal: 17 mg/dL (ref 5–40)

## 2017-04-30 LAB — T4, FREE: Free T4: 1.33 ng/dL (ref 0.82–1.77)

## 2017-04-30 LAB — TSH: TSH: 0.889 u[IU]/mL (ref 0.450–4.500)

## 2017-04-30 LAB — FOLATE: FOLATE: 17.5 ng/mL (ref >3.0)

## 2017-04-30 LAB — INSULIN, RANDOM: INSULIN: 8.6 u[IU]/mL (ref 2.6–24.9)

## 2017-04-30 LAB — VITAMIN B12: VITAMIN B 12: 594 pg/mL (ref 232–1245)

## 2017-04-30 LAB — T3: T3, Total: 101 ng/dL (ref 71–180)

## 2017-04-30 LAB — VITAMIN D 25 HYDROXY (VIT D DEFICIENCY, FRACTURES): Vit D, 25-Hydroxy: 19.6 ng/mL — ABNORMAL LOW (ref 30.0–100.0)

## 2017-04-30 NOTE — Progress Notes (Signed)
   THERAPIST PROGRESS NOTE  Time: 3:05-4:00  Participation Level:Active  Behavioral Response:CasualAlertEuthymic  Type of Therapy:Individual Therapy  Treatment Goals addressed:Coping  Interventions:CBT  Summary:Claudia Wall a 41y.o.femalewho presents with Mood Disorder.   Suicidal/Homicidal:Nowithout intent/plan  Therapist Response:Pt.Continues to presentas talkative, engaged in therapeutic process. Pt. Discussed that she and her girlfriend broke up. Pt. Discussed that she was sad about the breakup but that believed that it was the best decision because she has not been happy for a while. Pt. Discussed her plan to move into a new apartment and become "a cat lady". Pt. Discussed her past pattern of becoming quickly sexually involved in relationships and neglecting her emotional needs. Pt. Also discussed her pattern of taking on financial obligations too soon and becoming resentful of her partners. In this session the counselor encouraged Pt. That she could both be in relationship and not neglect her emotional needs by clearly communicating her needs and boundaries and asserting them in the relationship. Pt. Indicated that she had never been able to do this and was not sure she would be able to which is why she felt safer alone with her cat. Counselor explained that asserting her emotional needs was a skill that she could learn but validated that being alone for while to heal and and develop her self was a good idea before making a decision to be in another relationship.  Diagnosis: PTSD; MDD  Plan: Return for supportive, CBT. Next session in 30 days.   Nancie Neas, [facility] 04/30/2017

## 2017-04-30 NOTE — Telephone Encounter (Signed)
Error

## 2017-05-04 ENCOUNTER — Ambulatory Visit (INDEPENDENT_AMBULATORY_CARE_PROVIDER_SITE_OTHER): Payer: 59 | Admitting: Psychiatry

## 2017-05-04 DIAGNOSIS — F4312 Post-traumatic stress disorder, chronic: Secondary | ICD-10-CM | POA: Diagnosis not present

## 2017-05-06 ENCOUNTER — Emergency Department (HOSPITAL_COMMUNITY)
Admission: EM | Admit: 2017-05-06 | Discharge: 2017-05-06 | Discharge status: Against Medical Advice | Payer: 59 | Attending: Emergency Medicine | Admitting: Emergency Medicine

## 2017-05-06 ENCOUNTER — Encounter (HOSPITAL_COMMUNITY): Payer: Self-pay | Admitting: Emergency Medicine

## 2017-05-06 ENCOUNTER — Emergency Department (HOSPITAL_COMMUNITY): Payer: 59

## 2017-05-06 DIAGNOSIS — Z5321 Procedure and treatment not carried out due to patient leaving prior to being seen by health care provider: Secondary | ICD-10-CM | POA: Diagnosis not present

## 2017-05-06 DIAGNOSIS — R42 Dizziness and giddiness: Secondary | ICD-10-CM | POA: Diagnosis present

## 2017-05-06 NOTE — ED Notes (Signed)
Called for blood work without response

## 2017-05-06 NOTE — ED Notes (Signed)
No answer for blood draw @ this time. 

## 2017-05-06 NOTE — ED Notes (Addendum)
Second call for blood draw with no answer.Marland Kitchen

## 2017-05-06 NOTE — ED Notes (Signed)
Called x2 for vital recheck with no answer. 

## 2017-05-06 NOTE — ED Notes (Signed)
Patient called for vital recheck x1 with no answer. 

## 2017-05-06 NOTE — ED Triage Notes (Signed)
Per EMS. Pt reports she began to feel dizzy earlier today (describes as dizziness as "feeling like I am high"). Pt also reports she had CP earlier today, none now. Pt also felt hot. Hx of anxiety and panic attacks, but pt reports symptoms feel different.

## 2017-05-07 NOTE — Progress Notes (Signed)
   THERAPIST PROGRESS NOTE   Time: 3:05-4:00  Participation Level:Active  Behavioral Response:CasualAlertEuthymic  Type of Therapy:Individual Therapy  Treatment Goals addressed:Coping  Interventions:CBT  Summary:Claudia Wall a 41y.o.femalewho presents with Mood Disorder.   Suicidal/Homicidal:Nowithout intent/plan  Therapist Response:Claudia WallContinues to presentwith euthymic mood, talkative, engaged in therapeutic process. Pt. Discussed that she continues to move forward with her plan to move and her relationship with her partner has ended. Pt. Is actively moving forward with her self-care and reported that she got her nails done, met with bariatric physician to begin weight loss, and made appointment to go to a spa. Pt. Discussed that in her pain following the break-up she was tempted to enter a new relationship, but decided not to do because she knew that it was reactive to the break up and instead she decided to focus on herself. Pt. Discussed that she told her children about the break-up and they both have been supportive. Pt.'s children told her that she should start investing in herself and that to start she "should stop buying the cheap stuff". Pt. Interpreted that to mean that instead of buying the store brand groceries that she would treat herself to higher quality food because she was worth it, especially as she started focusing on her health. Therapist brought Pt.'s attention to how far she had come in the past few months from not being able to focus her attention on herself at all, not understanding the definition of self-care, to being able to choose self-care over turning to another potentially destructive relationship. Pt. Expressed gratification at her ability to make a positive choice for herself.  Diagnosis: PTSD; MDD  Plan: Return for supportive, CBT. Next session in 30 days.   Nancie Neas, [facility] 05/07/2017

## 2017-05-11 ENCOUNTER — Telehealth (HOSPITAL_COMMUNITY): Payer: Self-pay

## 2017-05-11 DIAGNOSIS — F4312 Post-traumatic stress disorder, chronic: Secondary | ICD-10-CM

## 2017-05-11 MED ORDER — FLUOXETINE HCL 20 MG PO CAPS: 40.0000 mg | ORAL_CAPSULE | Freq: Every day | ORAL | 0 refills | Status: DC

## 2017-05-13 ENCOUNTER — Ambulatory Visit (INDEPENDENT_AMBULATORY_CARE_PROVIDER_SITE_OTHER): Payer: 59 | Admitting: Family Medicine

## 2017-05-13 VITALS — BP 121/80 | HR 74 | Temp 98.9°F | Ht 63.0 in | Wt 244.0 lb

## 2017-05-13 DIAGNOSIS — R7303 Prediabetes: Secondary | ICD-10-CM

## 2017-05-13 DIAGNOSIS — E559 Vitamin D deficiency, unspecified: Secondary | ICD-10-CM | POA: Diagnosis not present

## 2017-05-13 DIAGNOSIS — Z9189 Other specified personal risk factors, not elsewhere classified: Secondary | ICD-10-CM | POA: Diagnosis not present

## 2017-05-13 DIAGNOSIS — Z6841 Body Mass Index (BMI) 40.0 and over, adult: Secondary | ICD-10-CM | POA: Diagnosis not present

## 2017-05-13 DIAGNOSIS — E7849 Other hyperlipidemia: Secondary | ICD-10-CM

## 2017-05-13 DIAGNOSIS — E66813 Obesity, class 3: Secondary | ICD-10-CM

## 2017-05-13 MED ORDER — VITAMIN D (ERGOCALCIFEROL) 1.25 MG (50000 UNIT) PO CAPS: 50000.0000 [IU] | ORAL_CAPSULE | ORAL | 0 refills | Status: DC

## 2017-05-13 MED ORDER — METFORMIN HCL 500 MG PO TABS: 500.0000 mg | ORAL_TABLET | Freq: Every day | ORAL | 0 refills | Status: DC

## 2017-05-13 NOTE — Progress Notes (Signed)
Office: [phone]  /  Fax: [phone]   HPI:   Chief Complaint: OBESITY Claudia Wall is here to discuss her progress with her obesity treatment plan. She is on the Category 3 plan + 200 calories and is following her eating plan approximately 90 % of the time. She states she is exercising 0 minutes 0 times per week. Claudia Wall is retaining some fluid but has struggled to follow her eating plan closely. She did have some celebration eating and drinking this weekend and underestimated the extra calories she took in.  Her weight is 244 lb (110.7 kg) today and has gained 1 pound since her last visit. She has lost 0 lbs since starting treatment with [name].  Hyperlipidemia Claudia Wall has hyperlipidemia and attempting to diet control to improve her cholesterol levels with intensive lifestyle modification including a low saturated fat diet, exercise and weight loss. She denies any chest pain, claudication or myalgias.  Vitamin D Deficiency Claudia Wall has a diagnosis of vitamin D deficiency. She is not on Vit D currently, level is low. She notes fatigue and denies nausea, vomiting or muscle weakness.  Pre-Diabetes Claudia Wall has a new diagnosis of pre-diabetes based on her elevated Hgb A1c at 5.8 and she notes polyphagia, she is struggling to follow her diet closely, snacking on simple carbohydrates. She was informed this puts her at greater risk of developing diabetes. She is taking metformin currently and continues to work on diet and exercise to decrease risk of diabetes. She denies nausea or hypoglycemia.  At risk for diabetes Claudia Wall is at higher than average risk for developing diabetes due to her obesity and pre-diabetes. She currently denies polyuria or polydipsia.  ALLERGIES: No Known Allergies  MEDICATIONS: Current Outpatient Medications on File Prior to Visit  Medication Sig Dispense Refill  . diphenhydrAMINE (BENADRYL) 25 mg capsule Take 25 mg by mouth at bedtime as needed.     Marland Kitchen FLUoxetine (PROZAC) 20  MG capsule Take 2 capsules (40 mg total) by mouth daily. 60 capsule 0  . hydrochlorothiazide (HYDRODIURIL) 25 MG tablet Take 1 tablet (25 mg total) by mouth daily. 90 tablet 3  . ibuprofen (ADVIL,MOTRIN) 200 MG tablet Take 600 mg by mouth every 6 (six) hours as needed for headache, mild pain or moderate pain.    . Melatonin 10 MG TABS Take 20-30 mg by mouth at bedtime.    . meloxicam (MOBIC) 15 MG tablet Take 1 tablet (15 mg total) by mouth daily. 30 tablet 0  . methocarbamol (ROBAXIN) 500 MG tablet Take 1 tablet (500 mg total) by mouth every 8 (eight) hours as needed for muscle spasms. 45 tablet 0  . naproxen sodium (ALEVE) 220 MG tablet Take 220 mg by mouth daily as needed.     No current facility-administered medications on file prior to visit.     PAST MEDICAL HISTORY: Past Medical History:  Diagnosis Date  . Alcohol abuse   . Anxiety   . Back pain   . Breast abscess   . Constipation   . Depression   . Gallbladder problem   . GERD (gastroesophageal reflux disease)   . HTN (hypertension)   . Hypertension   . Irregular periods/menstrual cycles   . Joint pain   . Lactose intolerance   . Leg edema   . Mastitis   . Obesity   . Obesity   . PCOS (polycystic ovarian syndrome)   . PTSD (post-traumatic stress disorder)   . Swallowing difficulty   . Tobacco abuse   .  Tobacco abuse     PAST SURGICAL HISTORY: Past Surgical History:  Procedure Laterality Date  . CHOLECYSTECTOMY  2002  . INCISION AND DRAINAGE BREAST ABSCESS      SOCIAL HISTORY: Social History   Tobacco Use  . Smoking status: Current Every Day Smoker    Packs/day: 0.25    Years: 25.00    Pack years: 6.25    Types: Cigarettes  . Smokeless tobacco: Never Used  . Tobacco comment: "used to smoke 1/2 to 1ppd depending on stress"  Substance Use Topics  . Alcohol use: Yes    Alcohol/week: 1.8 oz    Types: 3 Glasses of wine per week    Comment: occassional  . Drug use: No    FAMILY HISTORY: Family History   Problem Relation Age of Onset  . Cancer Mother   . Hypertension Mother   . Heart disease Mother   . Depression Mother   . Bipolar disorder Mother   . Schizophrenia Mother   . Alcoholism Mother   . Drug abuse Mother   . Obesity Mother   . Hypertension Father   . Diabetes Father     ROS: Review of Systems  Constitutional: Positive for malaise/fatigue. Negative for weight loss.  Cardiovascular: Negative for chest pain and claudication.  Gastrointestinal: Negative for nausea and vomiting.  Genitourinary: Negative for frequency.  Musculoskeletal: Negative for myalgias.       Negative muscle weakness  Endo/Heme/Allergies: Negative for polydipsia.       Negative hypoglycemia    PHYSICAL EXAM: Blood pressure 121/80, pulse 74, temperature 98.9 F (37.2 C), temperature source Oral, height 5\' 3"  (1.6 m), weight 244 lb (110.7 kg), last menstrual period 04/25/2017, SpO2 98 %. Body mass index is 43.22 kg/m. Physical Exam  Constitutional: She is oriented to person, place, and time. She appears well-developed and well-nourished.  Cardiovascular: Normal rate.  Pulmonary/Chest: Effort normal.  Musculoskeletal: Normal range of motion.  Neurological: She is oriented to person, place, and time.  Skin: Skin is warm and dry.  Psychiatric: She has a normal mood and affect. Her behavior is normal.  Vitals reviewed.   RECENT LABS AND TESTS: BMET    Component Value Date/Time   NA 137 04/29/2017 1138   K 4.3 04/29/2017 1138   CL 98 04/29/2017 1138   CO2 26 04/29/2017 1138   GLUCOSE 83 04/29/2017 1138   GLUCOSE 112 (H) 05/12/2016 1510   BUN 10 04/29/2017 1138   CREATININE 0.92 04/29/2017 1138   CREATININE 0.90 05/12/2016 1510   CALCIUM 9.7 04/29/2017 1138   GFRNONAA 78 04/29/2017 1138   GFRNONAA 80 05/12/2016 1510   GFRAA 89 04/29/2017 1138   GFRAA >89 05/12/2016 1510   Lab Results  Component Value Date   HGBA1C 5.8 (H) 04/29/2017   HGBA1C 5.7 02/15/2014   Lab Results    Component Value Date   INSULIN 8.6 04/29/2017   CBC    Component Value Date/Time   WBC 7.1 04/29/2017 1138   WBC 6.0 05/12/2016 1510   RBC 4.36 04/29/2017 1138   RBC 4.37 05/12/2016 1510   HGB 13.0 04/29/2017 1138   HCT 40.0 04/29/2017 1138   PLT 334 05/12/2016 1510   MCV 92 04/29/2017 1138   MCH 29.8 04/29/2017 1138   MCH 29.5 05/12/2016 1510   MCHC 32.5 04/29/2017 1138   MCHC 33.2 05/12/2016 1510   RDW 13.7 04/29/2017 1138   LYMPHSABS 3.6 (H) 04/29/2017 1138   MONOABS 540 05/12/2016 1510   EOSABS 0.1  04/29/2017 1138   BASOSABS 0.0 04/29/2017 1138   Iron/TIBC/Ferritin/ %Sat No results found for: IRON, TIBC, FERRITIN, IRONPCTSAT Lipid Panel     Component Value Date/Time   CHOL 193 04/29/2017 1138   TRIG 83 04/29/2017 1138   HDL 54 04/29/2017 1138   CHOLHDL 3.9 Ratio 06/12/2010 1723   VLDL 24 06/12/2010 1723   LDLCALC 122 (H) 04/29/2017 1138   Hepatic Function Panel     Component Value Date/Time   PROT 7.5 04/29/2017 1138   ALBUMIN 4.3 04/29/2017 1138   AST 20 04/29/2017 1138   ALT 28 04/29/2017 1138   ALKPHOS 86 04/29/2017 1138   BILITOT 0.3 04/29/2017 1138      Component Value Date/Time   TSH 0.889 04/29/2017 1138   TSH 1.020 02/11/2017 1522   TSH 0.96 04/08/2016 1612  Results for SHARONANN, MALBROUGH (MRN [id number]) as of 05/13/2017 15:59  Ref. Range 04/29/2017 11:38  Vitamin D, 25-Hydroxy Latest Ref Range: 30.0 - 100.0 ng/mL 19.6 (L)    ASSESSMENT AND PLAN: Other hyperlipidemia  Vitamin D deficiency - Plan: Vitamin D, Ergocalciferol, (DRISDOL) 50000 units CAPS capsule  Prediabetes - Plan: metFORMIN (GLUCOPHAGE) 500 MG tablet  At risk for diabetes mellitus  Class 3 severe obesity with serious comorbidity and body mass index (BMI) of 40.0 to 44.9 in adult, unspecified obesity type ([place])  PLAN:  Hyperlipidemia Sheika was informed of the American Heart Association Guidelines emphasizing intensive lifestyle modifications as the first line treatment for  hyperlipidemia. We discussed many lifestyle modifications today in depth, and Reona will continue to work on decreasing saturated fats such as fatty red meat, butter and many fried foods. She will also increase vegetables and lean protein in her diet and continue to work on diet, exercise and weight loss efforts. We will recheck labs in 3 months and Karlena agrees to follow up with our clinic in 2 weeks.  Vitamin D Deficiency Breleigh was informed that low vitamin D levels contributes to fatigue and are associated with obesity, breast, and colon cancer. Elmer agrees to start prescription Vit D @50 ,000 IU every week #4 with no refills. She will follow up for routine testing of vitamin D, at least 2-3 times per year. She was informed of the risk of over-replacement of vitamin D and agrees to not increase her dose unless she discusses this with [name] first. We will recheck labs in 3 months and Prakriti agrees to follow up with our clinic in 2 weeks.  Pre-Diabetes Melah will continue to work on weight loss, exercise, and decreasing simple carbohydrates in her diet to help decrease the risk of diabetes. We dicussed metformin including benefits and risks. She was informed that eating too many simple carbohydrates or too many calories at one sitting increases the likelihood of GI side effects. Judit agrees to start metformin 500 mg q AM #30 with no refills. Yaresly agrees to follow up with our clinic in 2 weeks as directed to monitor her progress.  Diabetes risk counselling Stormy was given extended (15 minutes) diabetes prevention counseling today. She is 42 y.o. female and has risk factors for diabetes including obesity and pre-diabetes. We discussed intensive lifestyle modifications today with an emphasis on weight loss as well as increasing exercise and decreasing simple carbohydrates in her diet.  Obesity Daylen is currently in the action stage of change. As such, her goal is to continue with weight loss  efforts She has agreed to follow the Category 3 plan + 200 calories Tahlia has been  instructed to work up to a goal of 150 minutes of combined cardio and strengthening exercise per week for weight loss and overall health benefits. We discussed the following Behavioral Modification Strategies today: increasing lean protein intake, decreasing simple carbohydrates  and decrease ETOH   Donzella has agreed to follow up with our clinic in 2 weeks. She was informed of the importance of frequent follow up visits to maximize her success with intensive lifestyle modifications for her multiple health conditions.   OBESITY BEHAVIORAL INTERVENTION VISIT  Today's visit was # 2 out of 22.  Starting weight: 243 lbs Starting date: 04/29/17 Today's weight : 244 lbs  Today's date: 05/13/2017 Total lbs lost to date: 0 (Patients must lose 7 lbs in the first 6 months to continue with counseling)   ASK: We discussed the diagnosis of obesity with Augustina Mood today and Sumaiyah agreed to give [name] permission to discuss obesity behavioral modification therapy today.  ASSESS: Carl has the diagnosis of obesity and her BMI today is 43.23 Kashina is in the action stage of change   ADVISE: Nakeda was educated on the multiple health risks of obesity as well as the benefit of weight loss to improve her health. She was advised of the need for long term treatment and the importance of lifestyle modifications.  AGREE: Multiple dietary modification options and treatment options were discussed and  Kataryna agreed to the above obesity treatment plan.  I, Trixie Dredge, am acting as transcriptionist for Dennard Nip, MD  I have reviewed the above documentation for accuracy and completeness, and I agree with the above. -Dennard Nip, MD

## 2017-05-25 ENCOUNTER — Telehealth: Payer: Self-pay | Admitting: Family Medicine

## 2017-05-25 NOTE — Telephone Encounter (Signed)
Pt was in the bed sick Thursday and Friday with head congestion.  She would like a drs note for those two days.  Please advise

## 2017-05-26 ENCOUNTER — Encounter: Payer: Self-pay | Admitting: Family Medicine

## 2017-05-26 NOTE — Telephone Encounter (Signed)
Letter made.  Please print and let patient know available for pickup.  Thank you.  Harriet Butte, [place], PGY-2

## 2017-05-27 ENCOUNTER — Ambulatory Visit (INDEPENDENT_AMBULATORY_CARE_PROVIDER_SITE_OTHER): Payer: 59 | Admitting: Physician Assistant

## 2017-05-27 ENCOUNTER — Ambulatory Visit (INDEPENDENT_AMBULATORY_CARE_PROVIDER_SITE_OTHER): Payer: 59 | Admitting: Family Medicine

## 2017-05-27 VITALS — BP 123/88 | HR 86 | Temp 98.1°F | Ht 63.0 in | Wt 236.0 lb

## 2017-05-27 DIAGNOSIS — R7303 Prediabetes: Secondary | ICD-10-CM

## 2017-05-27 DIAGNOSIS — Z6841 Body Mass Index (BMI) 40.0 and over, adult: Secondary | ICD-10-CM | POA: Diagnosis not present

## 2017-05-27 NOTE — Telephone Encounter (Signed)
Pt informed, will pick up letter today. Deseree Kennon Holter, CMA

## 2017-05-27 NOTE — Progress Notes (Signed)
Office: [phone]  /  Fax: [phone]   HPI:   Chief Complaint: OBESITY Claudia Wall is here to discuss her progress with her obesity treatment plan. She is on the Category 3 plan and is following her eating plan approximately 50 % of the time. She states she is exercising 0 minutes 0 times per week. Claudia Wall continues to do well with weight loss. She keeps up with her protein intake and states her hunger is well controlled. Claudia Wall has had an increase in emotional eating during her menses. Her weight is 236 lb (107 kg) today and has had a weight loss of 8 pounds over a period of 2 weeks since her last visit. She has lost 7 lbs since starting treatment with [name].  Pre-Diabetes Claudia Wall has a diagnosis of pre-diabetes based on her elevated Hgb A1c and was informed this puts her at greater risk of developing diabetes. She is taking metformin currently and continues to work on diet and exercise to decrease risk of diabetes. She denies nausea or hypoglycemia.  ALLERGIES: No Known Allergies  MEDICATIONS: Current Outpatient Medications on File Prior to Visit  Medication Sig Dispense Refill  . diphenhydrAMINE (BENADRYL) 25 mg capsule Take 25 mg by mouth at bedtime as needed.     Marland Kitchen FLUoxetine (PROZAC) 20 MG capsule Take 2 capsules (40 mg total) by mouth daily. 60 capsule 0  . hydrochlorothiazide (HYDRODIURIL) 25 MG tablet Take 1 tablet (25 mg total) by mouth daily. 90 tablet 3  . ibuprofen (ADVIL,MOTRIN) 200 MG tablet Take 600 mg by mouth every 6 (six) hours as needed for headache, mild pain or moderate pain.    . Melatonin 10 MG TABS Take 20-30 mg by mouth at bedtime.    . meloxicam (MOBIC) 15 MG tablet Take 1 tablet (15 mg total) by mouth daily. 30 tablet 0  . metFORMIN (GLUCOPHAGE) 500 MG tablet Take 1 tablet (500 mg total) by mouth daily with breakfast. 30 tablet 0  . methocarbamol (ROBAXIN) 500 MG tablet Take 1 tablet (500 mg total) by mouth every 8 (eight) hours as needed for muscle spasms. 45  tablet 0  . naproxen sodium (ALEVE) 220 MG tablet Take 220 mg by mouth daily as needed.    . Vitamin D, Ergocalciferol, (DRISDOL) 50000 units CAPS capsule Take 1 capsule (50,000 Units total) by mouth every 7 (seven) days. 4 capsule 0   No current facility-administered medications on file prior to visit.     PAST MEDICAL HISTORY: Past Medical History:  Diagnosis Date  . Alcohol abuse   . Anxiety   . Back pain   . Breast abscess   . Constipation   . Depression   . Gallbladder problem   . GERD (gastroesophageal reflux disease)   . HTN (hypertension)   . Hypertension   . Irregular periods/menstrual cycles   . Joint pain   . Lactose intolerance   . Leg edema   . Mastitis   . Obesity   . Obesity   . PCOS (polycystic ovarian syndrome)   . PTSD (post-traumatic stress disorder)   . Swallowing difficulty   . Tobacco abuse   . Tobacco abuse     PAST SURGICAL HISTORY: Past Surgical History:  Procedure Laterality Date  . CHOLECYSTECTOMY  2002  . INCISION AND DRAINAGE BREAST ABSCESS      SOCIAL HISTORY: Social History   Tobacco Use  . Smoking status: Current Every Day Smoker    Packs/day: 0.25    Years: 25.00  Pack years: 6.25    Types: Cigarettes  . Smokeless tobacco: Never Used  . Tobacco comment: "used to smoke 1/2 to 1ppd depending on stress"  Substance Use Topics  . Alcohol use: Yes    Alcohol/week: 1.8 oz    Types: 3 Glasses of wine per week    Comment: occassional  . Drug use: No    FAMILY HISTORY: Family History  Problem Relation Age of Onset  . Cancer Mother   . Hypertension Mother   . Heart disease Mother   . Depression Mother   . Bipolar disorder Mother   . Schizophrenia Mother   . Alcoholism Mother   . Drug abuse Mother   . Obesity Mother   . Hypertension Father   . Diabetes Father     ROS: Review of Systems  Constitutional: Positive for weight loss.  Gastrointestinal: Negative for nausea.  Endo/Heme/Allergies:       Negative for  hypoglycemia    PHYSICAL EXAM: Blood pressure 123/88, pulse 86, temperature 98.1 F (36.7 C), temperature source Oral, height 5\' 3"  (1.6 m), weight 236 lb (107 kg), SpO2 98 %. Body mass index is 41.81 kg/m. Physical Exam  Constitutional: She is oriented to person, place, and time. She appears well-developed and well-nourished.  Cardiovascular: Normal rate.  Pulmonary/Chest: Effort normal.  Musculoskeletal: Normal range of motion.  Neurological: She is oriented to person, place, and time.  Skin: Skin is warm and dry.  Psychiatric: She has a normal Wall and affect. Her behavior is normal.  Vitals reviewed.   RECENT LABS AND TESTS: BMET    Component Value Date/Time   NA 137 04/29/2017 1138   K 4.3 04/29/2017 1138   CL 98 04/29/2017 1138   CO2 26 04/29/2017 1138   GLUCOSE 83 04/29/2017 1138   GLUCOSE 112 (H) 05/12/2016 1510   BUN 10 04/29/2017 1138   CREATININE 0.92 04/29/2017 1138   CREATININE 0.90 05/12/2016 1510   CALCIUM 9.7 04/29/2017 1138   GFRNONAA 78 04/29/2017 1138   GFRNONAA 80 05/12/2016 1510   GFRAA 89 04/29/2017 1138   GFRAA >89 05/12/2016 1510   Lab Results  Component Value Date   HGBA1C 5.8 (H) 04/29/2017   HGBA1C 5.7 02/15/2014   Lab Results  Component Value Date   INSULIN 8.6 04/29/2017   CBC    Component Value Date/Time   WBC 7.1 04/29/2017 1138   WBC 6.0 05/12/2016 1510   RBC 4.36 04/29/2017 1138   RBC 4.37 05/12/2016 1510   HGB 13.0 04/29/2017 1138   HCT 40.0 04/29/2017 1138   PLT 334 05/12/2016 1510   MCV 92 04/29/2017 1138   MCH 29.8 04/29/2017 1138   MCH 29.5 05/12/2016 1510   MCHC 32.5 04/29/2017 1138   MCHC 33.2 05/12/2016 1510   RDW 13.7 04/29/2017 1138   LYMPHSABS 3.6 (H) 04/29/2017 1138   MONOABS 540 05/12/2016 1510   EOSABS 0.1 04/29/2017 1138   BASOSABS 0.0 04/29/2017 1138   Iron/TIBC/Ferritin/ %Sat No results found for: IRON, TIBC, FERRITIN, IRONPCTSAT Lipid Panel     Component Value Date/Time   CHOL 193 04/29/2017  1138   TRIG 83 04/29/2017 1138   HDL 54 04/29/2017 1138   CHOLHDL 3.9 Ratio 06/12/2010 1723   VLDL 24 06/12/2010 1723   LDLCALC 122 (H) 04/29/2017 1138   Hepatic Function Panel     Component Value Date/Time   PROT 7.5 04/29/2017 1138   ALBUMIN 4.3 04/29/2017 1138   AST 20 04/29/2017 1138   ALT 28  04/29/2017 1138   ALKPHOS 86 04/29/2017 1138   BILITOT 0.3 04/29/2017 1138      Component Value Date/Time   TSH 0.889 04/29/2017 1138   TSH 1.020 02/11/2017 1522   TSH 0.96 04/08/2016 1612    ASSESSMENT AND PLAN: Prediabetes  Class 3 severe obesity with serious comorbidity and body mass index (BMI) of 40.0 to 44.9 in adult, unspecified obesity type (Claudia Wall)  PLAN:  Pre-Diabetes Claudia Wall will continue to work on weight loss, exercise, and decreasing simple carbohydrates in her diet to help decrease the risk of diabetes. We dicussed metformin including benefits and risks. She was informed that eating too many simple carbohydrates or too many calories at one sitting increases the likelihood of GI side effects. Claudia Wall will continue metformin for now and a prescription was not written today. Claudia Wall agreed to follow up with [name] as directed to monitor her progress.  We spent > than 50% of the 15 minute visit on the counseling as documented in the note.  Obesity Claudia Wall is currently in the action stage of change. As such, her goal is to continue with weight loss efforts She has agreed to follow the Category 3 plan Claudia Wall has been instructed to work up to a goal of 150 minutes of combined cardio and strengthening exercise per week for weight loss and overall health benefits. We discussed the following Behavioral Modification Strategies today: increasing lean protein intake and emotional eating strategies  Claudia Wall has agreed to follow up with our clinic in 2 weeks. She was informed of the importance of frequent follow up visits to maximize her success with intensive lifestyle modifications for her  multiple health conditions.    OBESITY BEHAVIORAL INTERVENTION VISIT  Today's visit was # 3 out of 22.  Starting weight: 243 lbs Starting date: 04/29/17 Today's weight : 236 lbs Today's date: 05/27/2017 Total lbs lost to date: 7 (Patients must lose 7 lbs in the first 6 months to continue with counseling)   ASK: We discussed the diagnosis of obesity with Claudia Wall today and Claudia Wall agreed to give [name] permission to discuss obesity behavioral modification therapy today.  ASSESS: Claudia Wall has the diagnosis of obesity and her BMI today is 22.82 Claudia Wall is in the action stage of change   ADVISE: Claudia Wall was educated on the multiple health risks of obesity as well as the benefit of weight loss to improve her health. She was advised of the need for long term treatment and the importance of lifestyle modifications.  AGREE: Multiple dietary modification options and treatment options were discussed and  Claudia Wall agreed to the above obesity treatment plan.   Corey Skains, am acting as transcriptionist for [employer], PA-C I, [facility], have reviewed this note and agree with its content.

## 2017-05-30 ENCOUNTER — Encounter (HOSPITAL_COMMUNITY): Payer: Self-pay | Admitting: Psychiatry

## 2017-05-30 ENCOUNTER — Ambulatory Visit (HOSPITAL_COMMUNITY): Payer: 59 | Admitting: Psychiatry

## 2017-05-30 DIAGNOSIS — F4312 Post-traumatic stress disorder, chronic: Secondary | ICD-10-CM

## 2017-05-30 DIAGNOSIS — Z630 Problems in relationship with spouse or partner: Secondary | ICD-10-CM

## 2017-05-30 DIAGNOSIS — Z813 Family history of other psychoactive substance abuse and dependence: Secondary | ICD-10-CM

## 2017-05-30 DIAGNOSIS — F419 Anxiety disorder, unspecified: Secondary | ICD-10-CM

## 2017-05-30 DIAGNOSIS — Z811 Family history of alcohol abuse and dependence: Secondary | ICD-10-CM | POA: Diagnosis not present

## 2017-05-30 DIAGNOSIS — Z818 Family history of other mental and behavioral disorders: Secondary | ICD-10-CM | POA: Diagnosis not present

## 2017-05-30 DIAGNOSIS — F17210 Nicotine dependence, cigarettes, uncomplicated: Secondary | ICD-10-CM

## 2017-05-30 MED ORDER — FLUOXETINE HCL 40 MG PO CAPS: 40.0000 mg | ORAL_CAPSULE | Freq: Every day | ORAL | 1 refills | Status: DC

## 2017-05-30 MED ORDER — HYDROXYZINE PAMOATE 25 MG PO CAPS: 25.0000 mg | ORAL_CAPSULE | Freq: Every day | ORAL | 0 refills | Status: DC | PRN

## 2017-05-30 MED ORDER — TRAZODONE HCL 100 MG PO TABS: ORAL_TABLET | ORAL | 0 refills | Status: DC

## 2017-05-30 NOTE — Progress Notes (Signed)
[place] MD/PA/NP OP Progress Note  05/30/2017 10:57 AM Claudia Wall  MRN:  [id number]  Chief Complaint: med management  HPI: Claudia Wall reports that things are going well with her mood, considering the recent stressors including breakup with her girlfriend a few weeks ago.  She is in the process of finding a new place to live.  She reports that she continues to drink alcohol approximately 1-2 nights per week, and reports that reduction of anxiety and ongoing insomnia are driving factors for this.  We discussed introducing trazodone to help with insomnia symptoms and reviewed the risks and benefits.  She continues on Prozac at a dose of 40 mg daily and reports that Prozac has helped substantially with her sense of ability to tolerate frustration, and hopefulness for the future.  She has been trying to engage in hobbies and healthy living to improve herself.  She denies any acute safety issues.  She reports that she had one episode of panic in the context of recent breakup and argument with ex-girlfriend.  Offered Vistaril to be used as needed for episodes of anxiety or panic in the short-term.  She continues in individual therapy with Anderson Malta in this office and will follow-up with writer in 3 months/  Visit Diagnosis:    ICD-10-CM   1. Chronic post-traumatic stress disorder (PTSD) F43.12 FLUoxetine (PROZAC) 40 MG capsule    hydrOXYzine (VISTARIL) 25 MG capsule    traZODone (DESYREL) 100 MG tablet   Past Psychiatric History: See intake H&P for full details. Reviewed, with no updates at this time.  Past Medical History:  Past Medical History:  Diagnosis Date  . Alcohol abuse   . Anxiety   . Back pain   . Breast abscess   . Constipation   . Depression   . Gallbladder problem   . GERD (gastroesophageal reflux disease)   . HTN (hypertension)   . Hypertension   . Irregular periods/menstrual cycles   . Joint pain   . Lactose intolerance   . Leg edema   . Mastitis   . Obesity   .  Obesity   . PCOS (polycystic ovarian syndrome)   . PTSD (post-traumatic stress disorder)   . Swallowing difficulty   . Tobacco abuse   . Tobacco abuse     Past Surgical History:  Procedure Laterality Date  . CHOLECYSTECTOMY  2002  . INCISION AND DRAINAGE BREAST ABSCESS      Family Psychiatric History: See intake H&P for full details. Reviewed, with no updates at this time.   Family History:  Family History  Problem Relation Age of Onset  . Cancer Mother   . Hypertension Mother   . Heart disease Mother   . Depression Mother   . Bipolar disorder Mother   . Schizophrenia Mother   . Alcoholism Mother   . Drug abuse Mother   . Obesity Mother   . Hypertension Father   . Diabetes Father     Social History:  Social History   Socioeconomic History  . Marital status: Significant Other    Spouse name: Deforest Hoyles  . Number of children: None  . Years of education: None  . Highest education level: None  Social Needs  . Financial resource strain: None  . Food insecurity - worry: None  . Food insecurity - inability: None  . Transportation needs - medical: None  . Transportation needs - non-medical: None  Occupational History  . None  Tobacco Use  . Smoking status: Current  Every Day Smoker    Packs/day: 0.25    Years: 25.00    Pack years: 6.25    Types: Cigarettes  . Smokeless tobacco: Never Used  . Tobacco comment: "used to smoke 1/2 to 1ppd depending on stress"  Substance and Sexual Activity  . Alcohol use: Yes    Alcohol/week: 1.8 oz    Types: 3 Glasses of wine per week    Comment: occassional  . Drug use: No  . Sexual activity: Yes    Birth control/protection: Other-see comments, None    Comment: With women  Other Topics Concern  . None  Social History Narrative   Is in a same sex relationship x 1 year. Very supportive and loving per pt and partner.     Allergies: No Known Allergies  Metabolic Disorder Labs: Lab Results  Component Value Date   HGBA1C  5.8 (H) 04/29/2017   Lab Results  Component Value Date   PROLACTIN 10.6 02/11/2017   Lab Results  Component Value Date   CHOL 193 04/29/2017   TRIG 83 04/29/2017   HDL 54 04/29/2017   CHOLHDL 3.9 Ratio 06/12/2010   VLDL 24 06/12/2010   LDLCALC 122 (H) 04/29/2017   LDLCALC 107 (H) 06/12/2010   Lab Results  Component Value Date   TSH 0.889 04/29/2017   TSH 1.020 02/11/2017    Therapeutic Level Labs: No results found for: LITHIUM No results found for: VALPROATE No components found for:  CBMZ  Current Medications: Current Outpatient Medications  Medication Sig Dispense Refill  . diphenhydrAMINE (BENADRYL) 25 mg capsule Take 25 mg by mouth at bedtime as needed.     . hydrochlorothiazide (HYDRODIURIL) 25 MG tablet Take 1 tablet (25 mg total) by mouth daily. 90 tablet 3  . ibuprofen (ADVIL,MOTRIN) 200 MG tablet Take 600 mg by mouth every 6 (six) hours as needed for headache, mild pain or moderate pain.    . metFORMIN (GLUCOPHAGE) 500 MG tablet Take 1 tablet (500 mg total) by mouth daily with breakfast. 30 tablet 0  . methocarbamol (ROBAXIN) 500 MG tablet Take 1 tablet (500 mg total) by mouth every 8 (eight) hours as needed for muscle spasms. 45 tablet 0  . naproxen sodium (ALEVE) 220 MG tablet Take 220 mg by mouth daily as needed.    . Vitamin D, Ergocalciferol, (DRISDOL) 50000 units CAPS capsule Take 1 capsule (50,000 Units total) by mouth every 7 (seven) days. 4 capsule 0  . FLUoxetine (PROZAC) 40 MG capsule Take 1 capsule (40 mg total) by mouth daily. 90 capsule 1  . hydrOXYzine (VISTARIL) 25 MG capsule Take 1 capsule (25 mg total) by mouth daily as needed for anxiety. 30 capsule 0  . traZODone (DESYREL) 100 MG tablet Take 50-100 mg nightly for sleep 90 tablet 0   No current facility-administered medications for this visit.      Musculoskeletal: Strength & Muscle Tone: within normal limits Gait & Station: normal Patient leans: N/A  Psychiatric Specialty Exam: ROS   Blood pressure 136/84, pulse 85, height 5\' 3"  (1.6 m), weight 241 lb (109.3 kg).Body mass index is 42.69 kg/m.  General Appearance: Casual and Fairly Groomed  Eye Contact:  Fair  Speech:  Clear and Coherent and Normal Rate  Volume:  Normal  Mood:  Euthymic  Affect:  Appropriate and Congruent  Thought Process:  Goal Directed and Descriptions of Associations: Intact  Orientation:  Full (Time, Place, and Person)  Thought Content: Logical   Suicidal Thoughts:  No  Homicidal Thoughts:  No  Memory:  Immediate;   Fair  Judgement:  Fair  Insight:  Fair  Psychomotor Activity:  Normal  Concentration:  Concentration: Good  Recall:  Good  Fund of Knowledge: Good  Language: Good  Akathisia:  Negative  Handed:  Right  AIMS (if indicated): not done  Assets:  Communication Skills Desire for Improvement Financial Resources/Insurance Housing Intimacy Transportation Vocational/Educational  ADL's:  Intact  Cognition: WNL  Sleep:  Fair   Screenings: PHQ2-9     Office Visit from 04/29/2017 in [place] Office Visit from 10/22/2016 in [place] Office Visit from 08/19/2016 in [place] Office Visit from 02/29/2016 in [place] Office Visit from 01/18/2016 in [place]  PHQ-2 Total Score  4  0  0  0  0  PHQ-9 Total Score  19  No data  No data  No data  0      Assessment and Plan:  Claudia Wall has some stress related anxiety related to recent breakup, but appears to be coping generally pretty well.  She is taking Prozac 40 mg which has continued to provide benefit.  I suggested we proceed as below for some temporary increases in her anxiety and insomnia symptoms.  We will follow-up in 3 months, and she continues to engage in individual therapy in this office.  1. Chronic post-traumatic stress disorder (PTSD)     Status of current problems: stable  Labs Ordered: No orders of  the defined types were placed in this encounter.   Labs Reviewed: reviewed recent vitamin d level  Collateral Obtained/Records Reviewed: pcp note  Plan:  Prozac 40 mg daily Trazodone 50-100 mg nightly Vistaril 25 mg for anxiety or panic Continue individual therapy Continue to encourage healthy living, reduction of alcohol use  I spent 20 minutes with the patient in direct face-to-face clinical care.  Greater than 50% of this time was spent in counseling and coordination of care with the patient.    Aundra Dubin, MD 05/30/2017, 10:57 AM

## 2017-06-10 ENCOUNTER — Ambulatory Visit (INDEPENDENT_AMBULATORY_CARE_PROVIDER_SITE_OTHER): Payer: 59 | Admitting: Physician Assistant

## 2017-06-10 VITALS — BP 133/82 | HR 82 | Temp 98.5°F | Ht 63.0 in | Wt 240.0 lb

## 2017-06-10 DIAGNOSIS — Z6841 Body Mass Index (BMI) 40.0 and over, adult: Secondary | ICD-10-CM | POA: Diagnosis not present

## 2017-06-10 DIAGNOSIS — R7303 Prediabetes: Secondary | ICD-10-CM

## 2017-06-10 DIAGNOSIS — E559 Vitamin D deficiency, unspecified: Secondary | ICD-10-CM | POA: Diagnosis not present

## 2017-06-10 DIAGNOSIS — Z9189 Other specified personal risk factors, not elsewhere classified: Secondary | ICD-10-CM | POA: Diagnosis not present

## 2017-06-10 MED ORDER — VITAMIN D (ERGOCALCIFEROL) 1.25 MG (50000 UNIT) PO CAPS: 50000.0000 [IU] | ORAL_CAPSULE | ORAL | 0 refills | Status: DC

## 2017-06-10 MED ORDER — METFORMIN HCL 500 MG PO TABS: 500.0000 mg | ORAL_TABLET | Freq: Every day | ORAL | 0 refills | Status: DC

## 2017-06-10 NOTE — Progress Notes (Signed)
Office: [phone]  /  Fax: [phone]   HPI:   Chief Complaint: OBESITY Claudia Wall is here to discuss her progress with her obesity treatment plan. She is on the Category 3 plan and is following her eating plan approximately 30 to 40 % of the time. She states she is exercising 0 minutes 0 times per week. Claudia Wall has had increased emotional eating and has been drinking more high calories liquids. Her weight is 240 lb (108.9 kg) today and has had a weight gain of 4 pounds over a period of 2 weeks since her last visit. She has lost 3 lbs since starting treatment with [name].  Vitamin D deficiency Claudia Wall has a diagnosis of vitamin D deficiency. She is currently taking vit D and denies nausea, vomiting or muscle weakness.   Ref. Range 04/29/2017 11:38  Vitamin D, 25-Hydroxy Latest Ref Range: 30.0 - 100.0 ng/mL 19.6 (L)   Pre-Diabetes Claudia Wall has a diagnosis of pre-diabetes based on her elevated Hgb A1c and was informed this puts her at greater risk of developing diabetes. She is taking metformin currently and continues to work on diet and exercise to decrease risk of diabetes. She denies nausea, polyphagia or hypoglycemia.  At risk for diabetes Claudia Wall is at higher than average risk for developing diabetes due to her obesity and pre-diabetes. She currently denies polyuria or polydipsia.  ALLERGIES: No Known Allergies  MEDICATIONS: Current Outpatient Medications on File Prior to Visit  Medication Sig Dispense Refill  . diphenhydrAMINE (BENADRYL) 25 mg capsule Take 25 mg by mouth at bedtime as needed.     Marland Kitchen FLUoxetine (PROZAC) 40 MG capsule Take 1 capsule (40 mg total) by mouth daily. 90 capsule 1  . hydrochlorothiazide (HYDRODIURIL) 25 MG tablet Take 1 tablet (25 mg total) by mouth daily. 90 tablet 3  . hydrOXYzine (VISTARIL) 25 MG capsule Take 1 capsule (25 mg total) by mouth daily as needed for anxiety. 30 capsule 0  . ibuprofen (ADVIL,MOTRIN) 200 MG tablet Take 600 mg by mouth every 6 (six)  hours as needed for headache, mild pain or moderate pain.    . metFORMIN (GLUCOPHAGE) 500 MG tablet Take 1 tablet (500 mg total) by mouth daily with breakfast. 30 tablet 0  . methocarbamol (ROBAXIN) 500 MG tablet Take 1 tablet (500 mg total) by mouth every 8 (eight) hours as needed for muscle spasms. 45 tablet 0  . naproxen sodium (ALEVE) 220 MG tablet Take 220 mg by mouth daily as needed.    . traZODone (DESYREL) 100 MG tablet Take 50-100 mg nightly for sleep 90 tablet 0  . Vitamin D, Ergocalciferol, (DRISDOL) 50000 units CAPS capsule Take 1 capsule (50,000 Units total) by mouth every 7 (seven) days. 4 capsule 0   No current facility-administered medications on file prior to visit.     PAST MEDICAL HISTORY: Past Medical History:  Diagnosis Date  . Alcohol abuse   . Anxiety   . Back pain   . Breast abscess   . Constipation   . Depression   . Gallbladder problem   . GERD (gastroesophageal reflux disease)   . HTN (hypertension)   . Hypertension   . Irregular periods/menstrual cycles   . Joint pain   . Lactose intolerance   . Leg edema   . Mastitis   . Obesity   . Obesity   . PCOS (polycystic ovarian syndrome)   . PTSD (post-traumatic stress disorder)   . Swallowing difficulty   . Tobacco abuse   . Tobacco  abuse     PAST SURGICAL HISTORY: Past Surgical History:  Procedure Laterality Date  . CHOLECYSTECTOMY  2002  . INCISION AND DRAINAGE BREAST ABSCESS      SOCIAL HISTORY: Social History   Tobacco Use  . Smoking status: Current Every Day Smoker    Packs/day: 0.25    Years: 25.00    Pack years: 6.25    Types: Cigarettes  . Smokeless tobacco: Never Used  . Tobacco comment: "used to smoke 1/2 to 1ppd depending on stress"  Substance Use Topics  . Alcohol use: Yes    Alcohol/week: 1.8 oz    Types: 3 Glasses of wine per week    Comment: occassional  . Drug use: No    FAMILY HISTORY: Family History  Problem Relation Age of Onset  . Cancer Mother   .  Hypertension Mother   . Heart disease Mother   . Depression Mother   . Bipolar disorder Mother   . Schizophrenia Mother   . Alcoholism Mother   . Drug abuse Mother   . Obesity Mother   . Hypertension Father   . Diabetes Father     ROS: Review of Systems  Constitutional: Negative for weight loss.  Gastrointestinal: Negative for nausea and vomiting.  Genitourinary: Negative for frequency.  Musculoskeletal:       Negative for muscle weakness  Endo/Heme/Allergies: Negative for polydipsia.       Negative for polyphagia Negative for hypoglycemia    PHYSICAL EXAM: Blood pressure 133/82, pulse 82, temperature 98.5 F (36.9 C), temperature source Oral, height 5\' 3"  (1.6 m), weight 240 lb (108.9 kg), SpO2 100 %. Body mass index is 42.51 kg/m. Physical Exam  Constitutional: She is oriented to person, place, and time. She appears well-developed and well-nourished.  Cardiovascular: Normal rate.  Pulmonary/Chest: Effort normal.  Musculoskeletal: Normal range of motion.  Neurological: She is oriented to person, place, and time.  Skin: Skin is warm and dry.  Psychiatric: She has a normal Wall and affect. Her behavior is normal.  Vitals reviewed.   RECENT LABS AND TESTS: BMET    Component Value Date/Time   NA 137 04/29/2017 1138   K 4.3 04/29/2017 1138   CL 98 04/29/2017 1138   CO2 26 04/29/2017 1138   GLUCOSE 83 04/29/2017 1138   GLUCOSE 112 (H) 05/12/2016 1510   BUN 10 04/29/2017 1138   CREATININE 0.92 04/29/2017 1138   CREATININE 0.90 05/12/2016 1510   CALCIUM 9.7 04/29/2017 1138   GFRNONAA 78 04/29/2017 1138   GFRNONAA 80 05/12/2016 1510   GFRAA 89 04/29/2017 1138   GFRAA >89 05/12/2016 1510   Lab Results  Component Value Date   HGBA1C 5.8 (H) 04/29/2017   HGBA1C 5.7 02/15/2014   Lab Results  Component Value Date   INSULIN 8.6 04/29/2017   CBC    Component Value Date/Time   WBC 7.1 04/29/2017 1138   WBC 6.0 05/12/2016 1510   RBC 4.36 04/29/2017 1138   RBC  4.37 05/12/2016 1510   HGB 13.0 04/29/2017 1138   HCT 40.0 04/29/2017 1138   PLT 334 05/12/2016 1510   MCV 92 04/29/2017 1138   MCH 29.8 04/29/2017 1138   MCH 29.5 05/12/2016 1510   MCHC 32.5 04/29/2017 1138   MCHC 33.2 05/12/2016 1510   RDW 13.7 04/29/2017 1138   LYMPHSABS 3.6 (H) 04/29/2017 1138   MONOABS 540 05/12/2016 1510   EOSABS 0.1 04/29/2017 1138   BASOSABS 0.0 04/29/2017 1138   Iron/TIBC/Ferritin/ %Sat No results found  for: IRON, TIBC, FERRITIN, IRONPCTSAT Lipid Panel     Component Value Date/Time   CHOL 193 04/29/2017 1138   TRIG 83 04/29/2017 1138   HDL 54 04/29/2017 1138   CHOLHDL 3.9 Ratio 06/12/2010 1723   VLDL 24 06/12/2010 1723   LDLCALC 122 (H) 04/29/2017 1138   Hepatic Function Panel     Component Value Date/Time   PROT 7.5 04/29/2017 1138   ALBUMIN 4.3 04/29/2017 1138   AST 20 04/29/2017 1138   ALT 28 04/29/2017 1138   ALKPHOS 86 04/29/2017 1138   BILITOT 0.3 04/29/2017 1138      Component Value Date/Time   TSH 0.889 04/29/2017 1138   TSH 1.020 02/11/2017 1522   TSH 0.96 04/08/2016 1612    Ref. Range 04/29/2017 11:38  Vitamin D, 25-Hydroxy Latest Ref Range: 30.0 - 100.0 ng/mL 19.6 (L)   ASSESSMENT AND PLAN: Prediabetes - Plan: metFORMIN (GLUCOPHAGE) 500 MG tablet  Vitamin D deficiency - Plan: Vitamin D, Ergocalciferol, (DRISDOL) 50000 units CAPS capsule  At risk for diabetes mellitus  Class 3 severe obesity with serious comorbidity and body mass index (BMI) of 40.0 to 44.9 in adult, unspecified obesity type ([facility])  PLAN:  Vitamin D Deficiency Claudia Wall was informed that low vitamin D levels contributes to fatigue and are associated with obesity, breast, and colon cancer. She agrees to continue to take prescription Vit D @50 ,000 IU every week #4 with no refills and will follow up for routine testing of vitamin D, at least 2-3 times per year. She was informed of the risk of over-replacement of vitamin D and agrees to not increase her dose unless  she discusses this with [name] first. Claudia Wall agrees to follow up with our clinic in 2 weeks.  Pre-Diabetes Claudia Wall will continue to work on weight loss, exercise, and decreasing simple carbohydrates in her diet to help decrease the risk of diabetes. We dicussed metformin including benefits and risks. She was informed that eating too many simple carbohydrates or too many calories at one sitting increases the likelihood of GI side effects. Claudia Wall requested metformin for now and a prescription was written today for 1 month refill. Claudia Wall agreed to follow up with [name] as directed to monitor her progress.  Diabetes risk counseling Claudia Wall was given extended (15 minutes) diabetes prevention counseling today. She is 42 y.o. female and has risk factors for diabetes including obesity and pre-diabetes. We discussed intensive lifestyle modifications today with an emphasis on weight loss as well as increasing exercise and decreasing simple carbohydrates in her diet.  Obesity Claudia Wall is currently in the action stage of change. As such, her goal is to continue with weight loss efforts She has agreed to follow the Category 3 plan Claudia Wall has been instructed to work up to a goal of 150 minutes of combined cardio and strengthening exercise per week for weight loss and overall health benefits. We discussed the following Behavioral Modification Strategies today: emotional eating strategies and decrease liquid calories  Claudia Wall has agreed to follow up with our clinic in 2 weeks. She was informed of the importance of frequent follow up visits to maximize her success with intensive lifestyle modifications for her multiple health conditions.    OBESITY BEHAVIORAL INTERVENTION VISIT  Today's visit was # 4 out of 22.  Starting weight: 243 lbs Starting date: 04/29/17 Today's weight : 240 lbs Today's date: 06/10/2017 Total lbs lost to date: 3 (Patients must lose 7 lbs in the first 6 months to continue with  counseling)  ASK: We discussed the diagnosis of obesity with Claudia Wall today and Claudia Wall agreed to give [name] permission to discuss obesity behavioral modification therapy today.  ASSESS: Claudia Wall has the diagnosis of obesity and her BMI today is 42.52 Claudia Wall is in the action stage of change   ADVISE: Claudia Wall was educated on the multiple health risks of obesity as well as the benefit of weight loss to improve her health. She was advised of the need for long term treatment and the importance of lifestyle modifications.  AGREE: Multiple dietary modification options and treatment options were discussed and  Claudia Wall agreed to the above obesity treatment plan.   Claudia Wall, am acting as transcriptionist for [employer], PA-C I, Lacy Duverney [facility], have reviewed this note and agree with its content

## 2017-06-11 ENCOUNTER — Other Ambulatory Visit: Payer: Self-pay

## 2017-06-11 ENCOUNTER — Ambulatory Visit: Payer: 59 | Admitting: Family Medicine

## 2017-06-11 VITALS — HR 99 | Ht 63.0 in

## 2017-06-12 ENCOUNTER — Encounter: Payer: Self-pay | Admitting: Family Medicine

## 2017-06-12 ENCOUNTER — Ambulatory Visit (INDEPENDENT_AMBULATORY_CARE_PROVIDER_SITE_OTHER): Payer: 59 | Admitting: Family Medicine

## 2017-06-12 ENCOUNTER — Other Ambulatory Visit: Payer: Self-pay

## 2017-06-12 VITALS — BP 138/86 | HR 88 | Temp 98.5°F | Ht 63.0 in | Wt 244.8 lb

## 2017-06-12 DIAGNOSIS — N61 Mastitis without abscess: Secondary | ICD-10-CM | POA: Diagnosis not present

## 2017-06-12 MED ORDER — TRAMADOL HCL 50 MG PO TABS: 50.0000 mg | ORAL_TABLET | Freq: Four times a day (QID) | ORAL | 0 refills | Status: DC | PRN

## 2017-06-12 MED ORDER — FLUCONAZOLE 150 MG PO TABS: ORAL_TABLET | ORAL | 0 refills | Status: DC

## 2017-06-12 MED ORDER — DOXYCYCLINE HYCLATE 100 MG PO TABS
100.0000 mg | ORAL_TABLET | Freq: Two times a day (BID) | ORAL | 0 refills | Status: DC
Start: 2017-06-12 — End: 2017-11-03

## 2017-06-12 NOTE — Progress Notes (Signed)
Subjective:    Claudia Wall is a 42 y.o. female who presents to [facility] today for concerns for mastitis:  1.  Mastitis:  Chronic/recurrent issue for patient.  Started in the past several days.  She was seen at the obesity clinic last week on March 13 and this started shortly thereafter.  She states she has had some "squeezing" of the area which, combined with restarting smoking, often causes mastitis.  She is now smoking again.  She was able to quit for several months before restarting secondary to stress.  She feels tenderness around her breast and the area surrounding her areola.  She's had no drainage.  She has felt some heat from the area.  She is applying heat in the shower and wearing 2 bras for support.  It is painful when she does not have support.  She has tried Aleve without any pain relief.  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  She has had no fevers or chills.  No nausea or vomiting.  She is eating and drinking well.  She has not tried anything else for relief.  No abd pain.    ROS as above per HPI.    The following portions of the patient's history were reviewed and updated as appropriate: allergies, current medications, past medical history, family and social history, and problem list.   Patient is a smoker.    PMH reviewed.  Past Medical History:  Diagnosis Date  . Alcohol abuse   . Anxiety   . Back pain   . Breast abscess   . Constipation   . Depression   . Gallbladder problem   . GERD (gastroesophageal reflux disease)   . HTN (hypertension)   . Hypertension   . Irregular periods/menstrual cycles   . Joint pain   . Lactose intolerance   . Leg edema   . Mastitis   . Obesity   . Obesity   . PCOS (polycystic ovarian syndrome)   . PTSD (post-traumatic stress disorder)   . Swallowing difficulty   . Tobacco abuse   . Tobacco abuse    Past Surgical History:  Procedure Laterality Date  . CHOLECYSTECTOMY  2002  . INCISION AND DRAINAGE  BREAST ABSCESS      Medications reviewed. Current Outpatient Medications  Medication Sig Dispense Refill  . diphenhydrAMINE (BENADRYL) 25 mg capsule Take 25 mg by mouth at bedtime as needed.     Marland Kitchen FLUoxetine (PROZAC) 40 MG capsule Take 1 capsule (40 mg total) by mouth daily. 90 capsule 1  . hydrochlorothiazide (HYDRODIURIL) 25 MG tablet Take 1 tablet (25 mg total) by mouth daily. 90 tablet 3  . hydrOXYzine (VISTARIL) 25 MG capsule Take 1 capsule (25 mg total) by mouth daily as needed for anxiety. 30 capsule 0  . ibuprofen (ADVIL,MOTRIN) 200 MG tablet Take 600 mg by mouth every 6 (six) hours as needed for headache, mild pain or moderate pain.    . metFORMIN (GLUCOPHAGE) 500 MG tablet Take 1 tablet (500 mg total) by mouth daily with breakfast. 30 tablet 0  . methocarbamol (ROBAXIN) 500 MG tablet Take 1 tablet (500 mg total) by mouth every 8 (eight) hours as needed for muscle spasms. 45 tablet 0  . naproxen sodium (ALEVE) 220 MG tablet Take 220 mg by mouth daily as needed.    . traZODone (DESYREL) 100 MG tablet Take 50-100 mg nightly for sleep 90 tablet 0  . Vitamin D, Ergocalciferol, (DRISDOL) 50000 units CAPS capsule Take  1 capsule (50,000 Units total) by mouth every 7 (seven) days. 4 capsule 0   No current facility-administered medications for this visit.      Objective:   Physical Exam BP 138/86   Pulse 88   Temp 98.5 F (36.9 C) (Oral)   Ht 5\' 3"  (1.6 m)   Wt 244 lb 12.8 oz (111 kg)   SpO2 99%   BMI 43.36 kg/m  Gen:  Alert, cooperative patient who appears stated age in no acute distress.  Vital signs reviewed. HEENT: EOMI,  MMM Breast:  Some erythema surrounding areala. Minimal induration.  No fluctuance.  Unable to express any purulence from nipple/ducts.  No retracted nipple.  No masses.  TTP.  No swelling in upper breast/axilla  Imp/Plan: 1.  Mastitis: - recurrent issue. No evidence of abscess currently  - treat with Doxycycline as abx - pain relief with Tramadol   2.   Frequent yeast infections: -with abx - treat with Diflucan - see AVS

## 2017-06-12 NOTE — Patient Instructions (Addendum)
It was good to see you today.  Take the Tramadol for pain relief every 6 hours as needed.   Take the doxycycline for the infection.  Take this once in the morning and once the evening for the next 14 days.  Keep using heat and support issue have been for pain relief as well.  You can take 1 Diflucan now and another one again in 3 days to help prevent any yeast infection.  If you start to have a yeast infection after you finish the antibiotics you can take 1 and then repeat again in 3 days.  If this isn't better with the antibiotics, come back and see [name].

## 2017-06-12 NOTE — Progress Notes (Signed)
Patient was seen by Dr. Mingo Amber. Thanks.

## 2017-06-15 ENCOUNTER — Ambulatory Visit (INDEPENDENT_AMBULATORY_CARE_PROVIDER_SITE_OTHER): Payer: 59 | Admitting: Psychiatry

## 2017-06-15 DIAGNOSIS — F329 Major depressive disorder, single episode, unspecified: Secondary | ICD-10-CM | POA: Diagnosis not present

## 2017-06-15 DIAGNOSIS — F4312 Post-traumatic stress disorder, chronic: Secondary | ICD-10-CM

## 2017-06-15 DIAGNOSIS — F4310 Post-traumatic stress disorder, unspecified: Secondary | ICD-10-CM

## 2017-06-15 NOTE — Progress Notes (Signed)
Patient left without being seen on 06/11/17. Saw Dr Mingo Amber on 06/12/17. Sending this encounter to preceptor to close.  Danley Danker, RN [facility] RN)
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
## 2017-06-24 ENCOUNTER — Ambulatory Visit (INDEPENDENT_AMBULATORY_CARE_PROVIDER_SITE_OTHER): Payer: 59 | Admitting: Physician Assistant

## 2017-07-13 ENCOUNTER — Other Ambulatory Visit: Payer: Self-pay

## 2017-07-13 ENCOUNTER — Encounter: Payer: Self-pay | Admitting: Internal Medicine

## 2017-07-13 ENCOUNTER — Ambulatory Visit: Payer: 59 | Admitting: Internal Medicine

## 2017-07-13 DIAGNOSIS — R1031 Right lower quadrant pain: Secondary | ICD-10-CM | POA: Diagnosis not present

## 2017-07-13 NOTE — Progress Notes (Signed)
   [facility] Phone: [phone]  Subjective:  Claudia Wall is a 42 year old female presenting to clinic with RLQ abdominal pain for the last 3-4 days. She states she had a "severe" and "pulling" pain in her right lower quadrant. The pain bothered her throughout the whole weekend. She states she could barely walk and was "doubled over". The pain went away this morning. She was taking Ibuprofen, which helped. The pain did not radiate. She denies nausea, vomiting, or fevers. She endorses occasional chills. She had two BMs this morning, which were both diarrhea. She states she typically has diarrhea at baseline. She denies any vaginal discharge. She is sexually active only with women.  ROS: See HPI for pertinent positives and negatives  Past Medical History- HTN, PCOS, prediabetes, HLD  Family history reviewed for today's visit. No changes.  Social history- patient is a current smoker  Objective: BP 130/80   Pulse 80   Temp 98.4 F (36.9 C) (Oral)   Wt 244 lb (110.7 kg)   LMP 06/22/2017   BMI 43.22 kg/m  Gen: NAD, alert, cooperative with exam, well-appearing Resp: Normal work of breathing GI: +soft, non-distended, +focal tenderness to palpation in the RLQ, no rebound, no guarding, no abdominal wall defect, no hepatosplenomegaly.  Assessment/Plan: RLQ Abdominal Pain: Patient with "pulling" and severe RLQ abdominal pain over the last couple of days that resolved spontaneously. Concern for appendicitis, given her focal tenderness to palpation over McBurney's point; however she is very well-appearing, afebrile, and has not had any nausea or vomiting. Would not expect appendicitis to resolve on its own. She could also have a ruptured right ovarian cyst, as she does have a history of PCOS. This could explain severe right sided abdominal pain that resolved spontaneously. Ectopic pregnancy very unlikely, as patient only has sexual intercourse with women. PID unlikely without any  pelvic pain, fevers, or vaginal discharge. No abdominal wall hernia appreciated on abdominal exam. She could also have an abdominal muscle strain, given that she describes the pain as "pulling".  - Discussed different options with patient. Given that her pain has resolved on its own, will hold off on any imaging at this time. If her pain returns or if she starts having fevers, nausea, or vomiting, will order stat CT abdomen - Could also consider pelvic US to evaluate for ovarian cysts - Return precautions discussed in detail. Patient feels comfortable with the plan.   Hyman Bible, MD PGY-3

## 2017-07-13 NOTE — Assessment & Plan Note (Signed)
Patient with "pulling" and severe RLQ abdominal pain over the last couple of days that resolved spontaneously. Concern for appendicitis, given her focal tenderness to palpation over McBurney's point; however she is very well-appearing, afebrile, and has not had any nausea or vomiting. Would not expect appendicitis to resolve on its own. She could also have a ruptured right ovarian cyst, as she does have a history of PCOS. This could explain severe right sided abdominal pain that resolved spontaneously. Ectopic pregnancy very unlikely, as patient only has sexual intercourse with women. PID unlikely without any pelvic pain, fevers, or vaginal discharge. No abdominal wall hernia appreciated on abdominal exam. She could also have an abdominal muscle strain, given that she describes the pain as "pulling".  - Discussed different options with patient. Given that her pain has resolved on its own, will hold off on any imaging at this time. If her pain returns or if she starts having fevers, nausea, or vomiting, will order stat CT abdomen - Could also consider pelvic US to evaluate for ovarian cysts - Return precautions discussed in detail. Patient feels comfortable with the plan.

## 2017-07-13 NOTE — Patient Instructions (Addendum)
It was so nice to meet you!  I am not sure what is causing your belly pain, but I am glad it got better on its own. Let's wait and see what happens over the next couple of days. If your belly pain comes back or if you start having fevers, chills, vomiting, or feeling really sick, please let us know. We will likely need to get a CT scan of your abdomen at that time.  -Dr. Brett Albino

## 2017-07-21 ENCOUNTER — Ambulatory Visit (INDEPENDENT_AMBULATORY_CARE_PROVIDER_SITE_OTHER): Payer: 59 | Admitting: Physician Assistant

## 2017-07-21 VITALS — BP 122/78 | HR 84 | Temp 98.8°F | Ht 63.0 in | Wt 238.0 lb

## 2017-07-21 DIAGNOSIS — Z6841 Body Mass Index (BMI) 40.0 and over, adult: Secondary | ICD-10-CM | POA: Diagnosis not present

## 2017-07-21 DIAGNOSIS — R7303 Prediabetes: Secondary | ICD-10-CM | POA: Diagnosis not present

## 2017-07-21 DIAGNOSIS — Z9189 Other specified personal risk factors, not elsewhere classified: Secondary | ICD-10-CM

## 2017-07-21 DIAGNOSIS — E559 Vitamin D deficiency, unspecified: Secondary | ICD-10-CM | POA: Diagnosis not present

## 2017-07-21 MED ORDER — METFORMIN HCL 500 MG PO TABS: 500.0000 mg | ORAL_TABLET | Freq: Every day | ORAL | 0 refills | Status: DC

## 2017-07-21 MED ORDER — VITAMIN D (ERGOCALCIFEROL) 1.25 MG (50000 UNIT) PO CAPS: 50000.0000 [IU] | ORAL_CAPSULE | ORAL | 0 refills | Status: DC

## 2017-07-22 NOTE — Progress Notes (Signed)
Office: [phone]  /  Fax: [phone]   HPI:   Chief Complaint: OBESITY Claudia Wall is here to discuss her progress with her obesity treatment plan. She is on the Category 3 plan and is following her eating plan approximately 0 % of the time. She states she is exercising 0 minutes 0 times per week. Claudia Wall continues to do well with weight loss. Claudia Wall states she has not been following the meal plan, and continues to eat out more and drink high calorie liquids. Her weight is 238 lb (108 kg) today and has had a weight loss of 2 pounds over a period of 6 weeks since her last visit. She has lost 5 lbs since starting treatment with [name].  Vitamin D deficiency Claudia Wall has a diagnosis of vitamin D deficiency. She is currently taking vit D and denies nausea, vomiting or muscle weakness.  At risk for osteopenia and osteoporosis Claudia Wall is at higher risk of osteopenia and osteoporosis due to vitamin D deficiency.   Pre-Diabetes Claudia Wall has a diagnosis of pre-diabetes based on her elevated Hgb A1c and was informed this puts her at greater risk of developing diabetes. She is taking metformin currently and continues to work on diet and exercise to decrease risk of diabetes. She denies nausea, polyphagia or hypoglycemia.  ALLERGIES: No Known Allergies  MEDICATIONS: Current Outpatient Medications on File Prior to Visit  Medication Sig Dispense Refill  . diphenhydrAMINE (BENADRYL) 25 mg capsule Take 25 mg by mouth at bedtime as needed.     . doxycycline (VIBRA-TABS) 100 MG tablet Take 1 tablet (100 mg total) by mouth 2 (two) times daily. X 2 weeks 28 tablet 0  . fluconazole (DIFLUCAN) 150 MG tablet Take 1 after starting antibiotics, and second 3 days later.  May repeat after finishing abx course 4 tablet 0  . FLUoxetine (PROZAC) 40 MG capsule Take 1 capsule (40 mg total) by mouth daily. 90 capsule 1  . hydrochlorothiazide (HYDRODIURIL) 25 MG tablet Take 1 tablet (25 mg total) by mouth daily. 90 tablet 3    . hydrOXYzine (VISTARIL) 25 MG capsule Take 1 capsule (25 mg total) by mouth daily as needed for anxiety. 30 capsule 0  . ibuprofen (ADVIL,MOTRIN) 200 MG tablet Take 600 mg by mouth every 6 (six) hours as needed for headache, mild pain or moderate pain.    . methocarbamol (ROBAXIN) 500 MG tablet Take 1 tablet (500 mg total) by mouth every 8 (eight) hours as needed for muscle spasms. 45 tablet 0  . naproxen sodium (ALEVE) 220 MG tablet Take 220 mg by mouth daily as needed.    . traMADol (ULTRAM) 50 MG tablet Take 1-2 tablets (50-100 mg total) by mouth every 6 (six) hours as needed. 25 tablet 0  . traZODone (DESYREL) 100 MG tablet Take 50-100 mg nightly for sleep 90 tablet 0   No current facility-administered medications on file prior to visit.     PAST MEDICAL HISTORY: Past Medical History:  Diagnosis Date  . Alcohol abuse   . Anxiety   . Back pain   . Breast abscess   . Constipation   . Depression   . Gallbladder problem   . GERD (gastroesophageal reflux disease)   . HTN (hypertension)   . Hypertension   . Irregular periods/menstrual cycles   . Joint pain   . Lactose intolerance   . Leg edema   . Mastitis   . Obesity   . Obesity   . PCOS (polycystic ovarian syndrome)   .  PTSD (post-traumatic stress disorder)   . Swallowing difficulty   . Tobacco abuse   . Tobacco abuse     PAST SURGICAL HISTORY: Past Surgical History:  Procedure Laterality Date  . CHOLECYSTECTOMY  2002  . INCISION AND DRAINAGE BREAST ABSCESS      SOCIAL HISTORY: Social History   Tobacco Use  . Smoking status: Current Every Day Smoker    Packs/day: 0.25    Years: 25.00    Pack years: 6.25    Types: Cigarettes  . Smokeless tobacco: Never Used  . Tobacco comment: "used to smoke 1/2 to 1ppd depending on stress"  Substance Use Topics  . Alcohol use: Yes    Alcohol/week: 1.8 oz    Types: 3 Glasses of wine per week    Comment: occassional  . Drug use: No    FAMILY HISTORY: Family History   Problem Relation Age of Onset  . Cancer Mother   . Hypertension Mother   . Heart disease Mother   . Depression Mother   . Bipolar disorder Mother   . Schizophrenia Mother   . Alcoholism Mother   . Drug abuse Mother   . Obesity Mother   . Hypertension Father   . Diabetes Father     ROS: Review of Systems  Constitutional: Positive for weight loss.  Gastrointestinal: Negative for nausea and vomiting.  Musculoskeletal:       Negative for muscle weakness  Endo/Heme/Allergies:       Negative for polyphagia Negative for hypoglycemia    PHYSICAL EXAM: Blood pressure 122/78, pulse 84, temperature 98.8 F (37.1 C), temperature source Oral, height 5\' 3"  (1.6 m), weight 238 lb (108 kg), last menstrual period 06/22/2017, SpO2 99 %. Body mass index is 42.16 kg/m. Physical Exam  Constitutional: She is oriented to person, place, and time. She appears well-developed and well-nourished.  Cardiovascular: Normal rate.  Pulmonary/Chest: Effort normal.  Musculoskeletal: Normal range of motion.  Neurological: She is oriented to person, place, and time.  Skin: Skin is warm and dry.  Psychiatric: She has a normal Wall and affect. Her behavior is normal.  Vitals reviewed.   RECENT LABS AND TESTS: BMET    Component Value Date/Time   NA 137 04/29/2017 1138   K 4.3 04/29/2017 1138   CL 98 04/29/2017 1138   CO2 26 04/29/2017 1138   GLUCOSE 83 04/29/2017 1138   GLUCOSE 112 (H) 05/12/2016 1510   BUN 10 04/29/2017 1138   CREATININE 0.92 04/29/2017 1138   CREATININE 0.90 05/12/2016 1510   CALCIUM 9.7 04/29/2017 1138   GFRNONAA 78 04/29/2017 1138   GFRNONAA 80 05/12/2016 1510   GFRAA 89 04/29/2017 1138   GFRAA >89 05/12/2016 1510   Lab Results  Component Value Date   HGBA1C 5.8 (H) 04/29/2017   HGBA1C 5.7 02/15/2014   Lab Results  Component Value Date   INSULIN 8.6 04/29/2017   CBC    Component Value Date/Time   WBC 7.1 04/29/2017 1138   WBC 6.0 05/12/2016 1510   RBC 4.36  04/29/2017 1138   RBC 4.37 05/12/2016 1510   HGB 13.0 04/29/2017 1138   HCT 40.0 04/29/2017 1138   PLT 334 05/12/2016 1510   MCV 92 04/29/2017 1138   MCH 29.8 04/29/2017 1138   MCH 29.5 05/12/2016 1510   MCHC 32.5 04/29/2017 1138   MCHC 33.2 05/12/2016 1510   RDW 13.7 04/29/2017 1138   LYMPHSABS 3.6 (H) 04/29/2017 1138   MONOABS 540 05/12/2016 1510   EOSABS 0.1 04/29/2017  1138   BASOSABS 0.0 04/29/2017 1138   Iron/TIBC/Ferritin/ %Sat No results found for: IRON, TIBC, FERRITIN, IRONPCTSAT Lipid Panel     Component Value Date/Time   CHOL 193 04/29/2017 1138   TRIG 83 04/29/2017 1138   HDL 54 04/29/2017 1138   CHOLHDL 3.9 Ratio 06/12/2010 1723   VLDL 24 06/12/2010 1723   LDLCALC 122 (H) 04/29/2017 1138   Hepatic Function Panel     Component Value Date/Time   PROT 7.5 04/29/2017 1138   ALBUMIN 4.3 04/29/2017 1138   AST 20 04/29/2017 1138   ALT 28 04/29/2017 1138   ALKPHOS 86 04/29/2017 1138   BILITOT 0.3 04/29/2017 1138      Component Value Date/Time   TSH 0.889 04/29/2017 1138   TSH 1.020 02/11/2017 1522   TSH 0.96 04/08/2016 1612   Results for ISOBELLA, ASCHER (MRN [id number]) as of 07/22/2017 09:05  Ref. Range 04/29/2017 11:38  Vitamin D, 25-Hydroxy Latest Ref Range: 30.0 - 100.0 ng/mL 19.6 (L)   ASSESSMENT AND PLAN: Vitamin D deficiency - Plan: Vitamin D, Ergocalciferol, (DRISDOL) 50000 units CAPS capsule  Prediabetes - Plan: metFORMIN (GLUCOPHAGE) 500 MG tablet  At risk for osteoporosis  Class 3 severe obesity with serious comorbidity and body mass index (BMI) of 40.0 to 44.9 in adult, unspecified obesity type ([place])  PLAN:  Vitamin D Deficiency Claudia Wall was informed that low vitamin D levels contributes to fatigue and are associated with obesity, breast, and colon cancer. She agrees to continue to take prescription Vit D @50 ,000 IU every week #4 with no refills and will follow up for routine testing of vitamin D, at least 2-3 times per year. She was informed of  the risk of over-replacement of vitamin D and agrees to not increase her dose unless she discusses this with [name] first. Claudia Wall agree to follow up with our clinic in 2 weeks.  At risk for osteopenia and osteoporosis Claudia Wall is at risk for osteopenia and osteoporosis due to her vitamin D deficiency. She was encouraged to take her vitamin D and follow her higher calcium diet and increase strengthening exercise to help strengthen her bones and decrease her risk of osteopenia and osteoporosis.  Pre-Diabetes Claudia Wall will continue to work on weight loss, exercise, and decreasing simple carbohydrates in her diet to help decrease the risk of diabetes. We dicussed metformin including benefits and risks. She was informed that eating too many simple carbohydrates or too many calories at one sitting increases the likelihood of GI side effects. Claudia Wall requested metformin for now and a prescription was written today for 1 month refill. Claudia Wall agreed to follow up with [name] as directed to monitor her progress.  Obesity Claudia Wall is currently in the action stage of change. As such, her goal is to continue with weight loss efforts She has agreed to follow the Category 3 plan Claudia Wall has been instructed to work up to a goal of 150 minutes of combined cardio and strengthening exercise per week for weight loss and overall health benefits. We discussed the following Behavioral Modification Strategies today: decrease junk food and decrease liquid calories  Claudia Wall has agreed to follow up with our clinic in 2 weeks. She was informed of the importance of frequent follow up visits to maximize her success with intensive lifestyle modifications for her multiple health conditions.   OBESITY BEHAVIORAL INTERVENTION VISIT  Today's visit was # 5 out of 22.  Starting weight: 243 lbs Starting date: 04/29/17 Today's weight : 238 lbs  Today's date: 07/21/2017  Total lbs lost to date: 5 (Patients must lose 7 lbs in the first 6 months to  continue with counseling)   ASK: We discussed the diagnosis of obesity with Claudia Wall today and Claudia Wall agreed to give [name] permission to discuss obesity behavioral modification therapy today.  ASSESS: Claudia Wall has the diagnosis of obesity and her BMI today is 42.17 Claudia Wall is in the action stage of change   ADVISE: Claudia Wall was educated on the multiple health risks of obesity as well as the benefit of weight loss to improve her health. She was advised of the need for long term treatment and the importance of lifestyle modifications.  AGREE: Multiple dietary modification options and treatment options were discussed and  Claudia Wall agreed to the above obesity treatment plan.   Corey Skains, am acting as transcriptionist for [employer], PA-C I, Lacy Duverney [facility], have reviewed this note and agree with its content

## 2017-08-10 ENCOUNTER — Ambulatory Visit (INDEPENDENT_AMBULATORY_CARE_PROVIDER_SITE_OTHER): Payer: 59 | Admitting: Physician Assistant

## 2017-08-10 VITALS — BP 126/88 | HR 82 | Temp 99.0°F | Ht 63.0 in | Wt 238.0 lb

## 2017-08-10 DIAGNOSIS — Z6841 Body Mass Index (BMI) 40.0 and over, adult: Secondary | ICD-10-CM | POA: Diagnosis not present

## 2017-08-10 DIAGNOSIS — E7849 Other hyperlipidemia: Secondary | ICD-10-CM

## 2017-08-10 NOTE — Progress Notes (Signed)
Office: [phone]  /  Fax: [phone]   HPI:   Chief Complaint: OBESITY Claudia Wall is here to discuss her progress with her obesity treatment plan. She is on the Category 3 plan and is following her eating plan approximately 85 % of the time. She states she is exercising 0 minutes 0 times per week. Claudia Wall maintained her weight. She continues to have challenges preplanning and cooking her meals. She also eats out more. Her weight is 238 lb (108 kg) today and has maintained weight over a period of 3 weeks since her last visit. She has lost 5 lbs since starting treatment with [name].  Hyperlipidemia Claudia Wall has hyperlipidemia. She is not on medications and she declines today. Claudia Wall has been trying to improve her cholesterol levels with intensive lifestyle modification including a low saturated fat diet, exercise and weight loss. She denies any chest pain or claudication.  ALLERGIES: No Known Allergies  MEDICATIONS: Current Outpatient Medications on File Prior to Visit  Medication Sig Dispense Refill  . diphenhydrAMINE (BENADRYL) 25 mg capsule Take 25 mg by mouth at bedtime as needed.     . doxycycline (VIBRA-TABS) 100 MG tablet Take 1 tablet (100 mg total) by mouth 2 (two) times daily. X 2 weeks 28 tablet 0  . fluconazole (DIFLUCAN) 150 MG tablet Take 1 after starting antibiotics, and second 3 days later.  May repeat after finishing abx course 4 tablet 0  . FLUoxetine (PROZAC) 40 MG capsule Take 1 capsule (40 mg total) by mouth daily. 90 capsule 1  . hydrochlorothiazide (HYDRODIURIL) 25 MG tablet Take 1 tablet (25 mg total) by mouth daily. 90 tablet 3  . hydrOXYzine (VISTARIL) 25 MG capsule Take 1 capsule (25 mg total) by mouth daily as needed for anxiety. 30 capsule 0  . ibuprofen (ADVIL,MOTRIN) 200 MG tablet Take 600 mg by mouth every 6 (six) hours as needed for headache, mild pain or moderate pain.    . metFORMIN (GLUCOPHAGE) 500 MG tablet Take 1 tablet (500 mg total) by mouth daily with  breakfast. 30 tablet 0  . methocarbamol (ROBAXIN) 500 MG tablet Take 1 tablet (500 mg total) by mouth every 8 (eight) hours as needed for muscle spasms. 45 tablet 0  . naproxen sodium (ALEVE) 220 MG tablet Take 220 mg by mouth daily as needed.    . traMADol (ULTRAM) 50 MG tablet Take 1-2 tablets (50-100 mg total) by mouth every 6 (six) hours as needed. 25 tablet 0  . traZODone (DESYREL) 100 MG tablet Take 50-100 mg nightly for sleep 90 tablet 0  . Vitamin D, Ergocalciferol, (DRISDOL) 50000 units CAPS capsule Take 1 capsule (50,000 Units total) by mouth every 7 (seven) days. 4 capsule 0   No current facility-administered medications on file prior to visit.     PAST MEDICAL HISTORY: Past Medical History:  Diagnosis Date  . Alcohol abuse   . Anxiety   . Back pain   . Breast abscess   . Constipation   . Depression   . Gallbladder problem   . GERD (gastroesophageal reflux disease)   . HTN (hypertension)   . Hypertension   . Irregular periods/menstrual cycles   . Joint pain   . Lactose intolerance   . Leg edema   . Mastitis   . Obesity   . Obesity   . PCOS (polycystic ovarian syndrome)   . PTSD (post-traumatic stress disorder)   . Swallowing difficulty   . Tobacco abuse   . Tobacco abuse  PAST SURGICAL HISTORY: Past Surgical History:  Procedure Laterality Date  . CHOLECYSTECTOMY  2002  . INCISION AND DRAINAGE BREAST ABSCESS      SOCIAL HISTORY: Social History   Tobacco Use  . Smoking status: Current Every Day Smoker    Packs/day: 0.25    Years: 25.00    Pack years: 6.25    Types: Cigarettes  . Smokeless tobacco: Never Used  . Tobacco comment: "used to smoke 1/2 to 1ppd depending on stress"  Substance Use Topics  . Alcohol use: Yes    Alcohol/week: 1.8 oz    Types: 3 Glasses of wine per week    Comment: occassional  . Drug use: No    FAMILY HISTORY: Family History  Problem Relation Age of Onset  . Cancer Mother   . Hypertension Mother   . Heart disease  Mother   . Depression Mother   . Bipolar disorder Mother   . Schizophrenia Mother   . Alcoholism Mother   . Drug abuse Mother   . Obesity Mother   . Hypertension Father   . Diabetes Father     ROS: Review of Systems  Constitutional: Negative for weight loss.  Cardiovascular: Negative for chest pain and claudication.    PHYSICAL EXAM: Blood pressure 126/88, pulse 82, temperature 99 F (37.2 C), temperature source Oral, height 5\' 3"  (1.6 m), weight 238 lb (108 kg), SpO2 94 %. Body mass index is 42.16 kg/m. Physical Exam  Constitutional: She is oriented to person, place, and time. She appears well-developed and well-nourished.  Cardiovascular: Normal rate.  Pulmonary/Chest: Effort normal.  Musculoskeletal: Normal range of motion.  Neurological: She is oriented to person, place, and time.  Skin: Skin is warm and dry.  Psychiatric: She has a normal Wall and affect. Her behavior is normal.  Vitals reviewed.   RECENT LABS AND TESTS: BMET    Component Value Date/Time   NA 137 04/29/2017 1138   K 4.3 04/29/2017 1138   CL 98 04/29/2017 1138   CO2 26 04/29/2017 1138   GLUCOSE 83 04/29/2017 1138   GLUCOSE 112 (H) 05/12/2016 1510   BUN 10 04/29/2017 1138   CREATININE 0.92 04/29/2017 1138   CREATININE 0.90 05/12/2016 1510   CALCIUM 9.7 04/29/2017 1138   GFRNONAA 78 04/29/2017 1138   GFRNONAA 80 05/12/2016 1510   GFRAA 89 04/29/2017 1138   GFRAA >89 05/12/2016 1510   Lab Results  Component Value Date   HGBA1C 5.8 (H) 04/29/2017   HGBA1C 5.7 02/15/2014   Lab Results  Component Value Date   INSULIN 8.6 04/29/2017   CBC    Component Value Date/Time   WBC 7.1 04/29/2017 1138   WBC 6.0 05/12/2016 1510   RBC 4.36 04/29/2017 1138   RBC 4.37 05/12/2016 1510   HGB 13.0 04/29/2017 1138   HCT 40.0 04/29/2017 1138   PLT 334 05/12/2016 1510   MCV 92 04/29/2017 1138   MCH 29.8 04/29/2017 1138   MCH 29.5 05/12/2016 1510   MCHC 32.5 04/29/2017 1138   MCHC 33.2 05/12/2016  1510   RDW 13.7 04/29/2017 1138   LYMPHSABS 3.6 (H) 04/29/2017 1138   MONOABS 540 05/12/2016 1510   EOSABS 0.1 04/29/2017 1138   BASOSABS 0.0 04/29/2017 1138   Iron/TIBC/Ferritin/ %Sat No results found for: IRON, TIBC, FERRITIN, IRONPCTSAT Lipid Panel     Component Value Date/Time   CHOL 193 04/29/2017 1138   TRIG 83 04/29/2017 1138   HDL 54 04/29/2017 1138   CHOLHDL 3.9 Ratio 06/12/2010  1723   VLDL 24 06/12/2010 1723   LDLCALC 122 (H) 04/29/2017 1138   Hepatic Function Panel     Component Value Date/Time   PROT 7.5 04/29/2017 1138   ALBUMIN 4.3 04/29/2017 1138   AST 20 04/29/2017 1138   ALT 28 04/29/2017 1138   ALKPHOS 86 04/29/2017 1138   BILITOT 0.3 04/29/2017 1138      Component Value Date/Time   TSH 0.889 04/29/2017 1138   TSH 1.020 02/11/2017 1522   TSH 0.96 04/08/2016 1612    Results for ARALI, SOMERA (MRN [id number]) as of 08/10/2017 15:22  Ref. Range 04/29/2017 11:38  Vitamin D, 25-Hydroxy Latest Ref Range: 30.0 - 100.0 ng/mL 19.6 (L)   ASSESSMENT AND PLAN: Other hyperlipidemia  Class 3 severe obesity with serious comorbidity and body mass index (BMI) of 40.0 to 44.9 in adult, unspecified obesity type ([place])  PLAN:  Hyperlipidemia Claudia Wall was informed of the American Heart Association Guidelines emphasizing intensive lifestyle modifications as the first line treatment for hyperlipidemia. We discussed many lifestyle modifications today in depth, and Claudia Wall will continue to work on decreasing saturated fats such as fatty red meat, butter and many fried foods. She will also increase vegetables and lean protein in her diet and continue to work on exercise and weight loss efforts.  We spent > than 50% of the 15 minute visit on the counseling as documented in the note.  Obesity Claudia Wall is currently in the action stage of change. As such, her goal is to continue with weight loss efforts She has agreed to follow the Category 3 plan Claudia Wall has been instructed to  work up to a goal of 150 minutes of combined cardio and strengthening exercise per week for weight loss and overall health benefits. We discussed the following Behavioral Modification Strategies today: decrease eating out and decrease junk food  Claudia Wall has agreed to follow up with our clinic in 2 weeks. She was informed of the importance of frequent follow up visits to maximize her success with intensive lifestyle modifications for her multiple health conditions.   OBESITY BEHAVIORAL INTERVENTION VISIT  Today's visit was # 6 out of 22.  Starting weight: 243 lbs Starting date: 04/29/17 Today's weight : 238 lbs Today's date: 08/10/2017 Total lbs lost to date: 5 (Patients must lose 7 lbs in the first 6 months to continue with counseling)   ASK: We discussed the diagnosis of obesity with Claudia Wall today and Claudia Wall agreed to give [name] permission to discuss obesity behavioral modification therapy today.  ASSESS: Claudia Wall has the diagnosis of obesity and her BMI today is 42.17 Claudia Wall is in the action stage of change   ADVISE: Claudia Wall was educated on the multiple health risks of obesity as well as the benefit of weight loss to improve her health. She was advised of the need for long term treatment and the importance of lifestyle modifications.  AGREE: Multiple dietary modification options and treatment options were discussed and  Claudia Wall agreed to the above obesity treatment plan.   Corey Skains, am acting as transcriptionist for [employer], PA-C I, Lacy Duverney [facility], have reviewed this note and agree with its content

## 2017-08-17 ENCOUNTER — Telehealth (HOSPITAL_COMMUNITY): Payer: Self-pay

## 2017-08-17 NOTE — Telephone Encounter (Signed)
She should come in for a visit, she can call the front to schedule

## 2017-08-17 NOTE — Telephone Encounter (Signed)
Patient is calling because she wants to know if you can send in her Prozac as 2 20 mg capsules again. She said the 40 mg capsules are not working as well and she is still feeling "manicy".

## 2017-08-25 ENCOUNTER — Ambulatory Visit (INDEPENDENT_AMBULATORY_CARE_PROVIDER_SITE_OTHER): Payer: 59 | Admitting: Psychiatry

## 2017-08-25 DIAGNOSIS — F4310 Post-traumatic stress disorder, unspecified: Secondary | ICD-10-CM

## 2017-08-25 DIAGNOSIS — F4312 Post-traumatic stress disorder, chronic: Secondary | ICD-10-CM

## 2017-08-25 DIAGNOSIS — F419 Anxiety disorder, unspecified: Secondary | ICD-10-CM | POA: Diagnosis not present

## 2017-08-25 DIAGNOSIS — F329 Major depressive disorder, single episode, unspecified: Secondary | ICD-10-CM

## 2017-08-25 NOTE — Telephone Encounter (Signed)
I called the patient several times and did not get an answer

## 2017-08-26 NOTE — Progress Notes (Signed)
   THERAPIST PROGRESS NOTE  Time: 3:05-4:00  Participation Level:Active  Behavioral Response:CasualAlertEuthymic  Type of Therapy:Individual Therapy  Treatment Goals addressed:Coping  Interventions:CBT  Summary:Claudia Wall a 41y.o.femalewho presents with Mood Disorder.   Suicidal/Homicidal:Nowithout intent/plan  Therapist Response:Pt. Presented with significantly brightened mood compared to last session. Pt. Reports that she has moved successfully and is very happy with her new "friend". Pt. Discussed that her son and daughter are home for the summer and she is happy that they are both doing well in college. Pt. Discussed that she is not satisfied with her medication and continues to have occasional panic attacks. Pt. Was given breathing with body scan and visualization exercise to do daily and at the onset of her panic attacks. Pt. Was given specific instructions for how to complete and demonstration in session. Pt. Indicated that she would be able to do this. Pt. Also discussed that she is working 50+ hours a week and spending evenings with her friend that is resulting in sleep deprivation. Pt. Was given recommendation to assess her sleeping schedule so that she can get a minimum of 7-9 hours a sleep daily to assist with wellness and management of mood and anxiety.    Diagnosis: PTSD; MDD; anxiety state  Plan: Return for supportive, CBT. Next session in 30 days.    Claudia Wall, [facility] 08/26/2017

## 2017-08-27 ENCOUNTER — Ambulatory Visit (INDEPENDENT_AMBULATORY_CARE_PROVIDER_SITE_OTHER): Payer: 59 | Admitting: Physician Assistant

## 2017-08-27 VITALS — BP 128/83 | HR 91 | Temp 98.3°F | Ht 63.0 in | Wt 240.0 lb

## 2017-08-27 DIAGNOSIS — E559 Vitamin D deficiency, unspecified: Secondary | ICD-10-CM

## 2017-08-27 DIAGNOSIS — Z6841 Body Mass Index (BMI) 40.0 and over, adult: Secondary | ICD-10-CM | POA: Diagnosis not present

## 2017-08-27 NOTE — Progress Notes (Signed)
Office: [phone]  /  Fax: [phone]   HPI:   Chief Complaint: OBESITY Claudia Wall is here to discuss her progress with her obesity treatment plan. She is on the Category 3 plan and is following her eating plan approximately 75 % of the time. She states she is walking for 15 minutes 1 times per week. Claudia Wall has not been eating the recommended protein on the meal plan. Also, she snacks more and is drinking more liquid calories.  Her weight is 240 lb (108.9 kg) today and has gained 2 pounds since her last visit. She has lost 3 lbs since starting treatment with [name].  Vitamin D Deficiency Claudia Wall has a diagnosis of vitamin D deficiency. She is currently taking prescription Vit D and denies nausea, vomiting or muscle weakness.  ALLERGIES: No Known Allergies  MEDICATIONS: Current Outpatient Medications on File Prior to Visit  Medication Sig Dispense Refill  . diphenhydrAMINE (BENADRYL) 25 mg capsule Take 25 mg by mouth at bedtime as needed.     . doxycycline (VIBRA-TABS) 100 MG tablet Take 1 tablet (100 mg total) by mouth 2 (two) times daily. X 2 weeks 28 tablet 0  . fluconazole (DIFLUCAN) 150 MG tablet Take 1 after starting antibiotics, and second 3 days later.  May repeat after finishing abx course 4 tablet 0  . FLUoxetine (PROZAC) 40 MG capsule Take 1 capsule (40 mg total) by mouth daily. 90 capsule 1  . hydrochlorothiazide (HYDRODIURIL) 25 MG tablet Take 1 tablet (25 mg total) by mouth daily. 90 tablet 3  . hydrOXYzine (VISTARIL) 25 MG capsule Take 1 capsule (25 mg total) by mouth daily as needed for anxiety. 30 capsule 0  . ibuprofen (ADVIL,MOTRIN) 200 MG tablet Take 600 mg by mouth every 6 (six) hours as needed for headache, mild pain or moderate pain.    . metFORMIN (GLUCOPHAGE) 500 MG tablet Take 1 tablet (500 mg total) by mouth daily with breakfast. 30 tablet 0  . methocarbamol (ROBAXIN) 500 MG tablet Take 1 tablet (500 mg total) by mouth every 8 (eight) hours as needed for muscle  spasms. 45 tablet 0  . naproxen sodium (ALEVE) 220 MG tablet Take 220 mg by mouth daily as needed.    . traMADol (ULTRAM) 50 MG tablet Take 1-2 tablets (50-100 mg total) by mouth every 6 (six) hours as needed. 25 tablet 0  . traZODone (DESYREL) 100 MG tablet Take 50-100 mg nightly for sleep 90 tablet 0  . Vitamin D, Ergocalciferol, (DRISDOL) 50000 units CAPS capsule Take 1 capsule (50,000 Units total) by mouth every 7 (seven) days. 4 capsule 0   No current facility-administered medications on file prior to visit.     PAST MEDICAL HISTORY: Past Medical History:  Diagnosis Date  . Alcohol abuse   . Anxiety   . Back pain   . Breast abscess   . Constipation   . Depression   . Gallbladder problem   . GERD (gastroesophageal reflux disease)   . HTN (hypertension)   . Hypertension   . Irregular periods/menstrual cycles   . Joint pain   . Lactose intolerance   . Leg edema   . Mastitis   . Obesity   . Obesity   . PCOS (polycystic ovarian syndrome)   . PTSD (post-traumatic stress disorder)   . Swallowing difficulty   . Tobacco abuse   . Tobacco abuse     PAST SURGICAL HISTORY: Past Surgical History:  Procedure Laterality Date  . CHOLECYSTECTOMY  2002  .  INCISION AND DRAINAGE BREAST ABSCESS      SOCIAL HISTORY: Social History   Tobacco Use  . Smoking status: Current Every Day Smoker    Packs/day: 0.25    Years: 25.00    Pack years: 6.25    Types: Cigarettes  . Smokeless tobacco: Never Used  . Tobacco comment: "used to smoke 1/2 to 1ppd depending on stress"  Substance Use Topics  . Alcohol use: Yes    Alcohol/week: 1.8 oz    Types: 3 Glasses of wine per week    Comment: occassional  . Drug use: No    FAMILY HISTORY: Family History  Problem Relation Age of Onset  . Cancer Mother   . Hypertension Mother   . Heart disease Mother   . Depression Mother   . Bipolar disorder Mother   . Schizophrenia Mother   . Alcoholism Mother   . Drug abuse Mother   . Obesity  Mother   . Hypertension Father   . Diabetes Father     ROS: Review of Systems  Constitutional: Negative for weight loss.  Gastrointestinal: Negative for nausea and vomiting.  Musculoskeletal:       Negative muscle weakness    PHYSICAL EXAM: Blood pressure 128/83, pulse 91, temperature 98.3 F (36.8 C), temperature source Oral, height 5\' 3"  (1.6 m), weight 240 lb (108.9 kg), SpO2 99 %. Body mass index is 42.51 kg/m. Physical Exam  Constitutional: She is oriented to person, place, and time. She appears well-developed and well-nourished.  Cardiovascular: Normal rate.  Pulmonary/Chest: Effort normal.  Musculoskeletal: Normal range of motion.  Neurological: She is oriented to person, place, and time.  Skin: Skin is warm and dry.  Psychiatric: She has a normal Wall and affect. Her behavior is normal.  Vitals reviewed.   RECENT LABS AND TESTS: BMET    Component Value Date/Time   NA 137 04/29/2017 1138   K 4.3 04/29/2017 1138   CL 98 04/29/2017 1138   CO2 26 04/29/2017 1138   GLUCOSE 83 04/29/2017 1138   GLUCOSE 112 (H) 05/12/2016 1510   BUN 10 04/29/2017 1138   CREATININE 0.92 04/29/2017 1138   CREATININE 0.90 05/12/2016 1510   CALCIUM 9.7 04/29/2017 1138   GFRNONAA 78 04/29/2017 1138   GFRNONAA 80 05/12/2016 1510   GFRAA 89 04/29/2017 1138   GFRAA >89 05/12/2016 1510   Lab Results  Component Value Date   HGBA1C 5.8 (H) 04/29/2017   HGBA1C 5.7 02/15/2014   Lab Results  Component Value Date   INSULIN 8.6 04/29/2017   CBC    Component Value Date/Time   WBC 7.1 04/29/2017 1138   WBC 6.0 05/12/2016 1510   RBC 4.36 04/29/2017 1138   RBC 4.37 05/12/2016 1510   HGB 13.0 04/29/2017 1138   HCT 40.0 04/29/2017 1138   PLT 334 05/12/2016 1510   MCV 92 04/29/2017 1138   MCH 29.8 04/29/2017 1138   MCH 29.5 05/12/2016 1510   MCHC 32.5 04/29/2017 1138   MCHC 33.2 05/12/2016 1510   RDW 13.7 04/29/2017 1138   LYMPHSABS 3.6 (H) 04/29/2017 1138   MONOABS 540 05/12/2016  1510   EOSABS 0.1 04/29/2017 1138   BASOSABS 0.0 04/29/2017 1138   Iron/TIBC/Ferritin/ %Sat No results found for: IRON, TIBC, FERRITIN, IRONPCTSAT Lipid Panel     Component Value Date/Time   CHOL 193 04/29/2017 1138   TRIG 83 04/29/2017 1138   HDL 54 04/29/2017 1138   CHOLHDL 3.9 Ratio 06/12/2010 1723   VLDL 24 06/12/2010 1723  LDLCALC 122 (H) 04/29/2017 1138   Hepatic Function Panel     Component Value Date/Time   PROT 7.5 04/29/2017 1138   ALBUMIN 4.3 04/29/2017 1138   AST 20 04/29/2017 1138   ALT 28 04/29/2017 1138   ALKPHOS 86 04/29/2017 1138   BILITOT 0.3 04/29/2017 1138      Component Value Date/Time   TSH 0.889 04/29/2017 1138   TSH 1.020 02/11/2017 1522   TSH 0.96 04/08/2016 1612  Results for DIVYA, MUNSHI (MRN [id number]) as of 08/27/2017 14:47  Ref. Range 04/29/2017 11:38  Vitamin D, 25-Hydroxy Latest Ref Range: 30.0 - 100.0 ng/mL 19.6 (L)    ASSESSMENT AND PLAN: Vitamin D deficiency  Class 3 severe obesity with serious comorbidity and body mass index (BMI) of 40.0 to 44.9 in adult, unspecified obesity type ([place])  PLAN:  Vitamin D Deficiency Claudia Wall was informed that low vitamin D levels contributes to fatigue and are associated with obesity, breast, and colon cancer. Claudia Wall agrees to continue taking prescription Vit D @50 ,000 IU every week and will follow up for routine testing of vitamin D, at least 2-3 times per year. She was informed of the risk of over-replacement of vitamin D and agrees to not increase her dose unless she discusses this with [name] first. Claudia Wall agrees to follow up with our clinic 2 weeks.  We spent > than 50% of the 15 minute visit on the counseling as documented in the note.  Obesity Claudia Wall is currently in the action stage of change. As such, her goal is to continue with weight loss efforts She has agreed to portion control better and make smarter food choices, such as increase vegetables and decrease simple carbohydrates  Claudia Wall has  been instructed to work up to a goal of 150 minutes of combined cardio and strengthening exercise per week for weight loss and overall health benefits. We discussed the following Behavioral Modification Strategies today: decrease liquid calories and better snacking choices   Claudia Wall has agreed to follow up with our clinic in 2 weeks. She was informed of the importance of frequent follow up visits to maximize her success with intensive lifestyle modifications for her multiple health conditions.   OBESITY BEHAVIORAL INTERVENTION VISIT  Today's visit was # 7 out of 22.  Starting weight: 243 lbs Starting date: 04/29/17 Today's weight : 240 lbs  Today's date: 08/27/2017 Total lbs lost to date: 3 (Patients must lose 7 lbs in the first 6 months to continue with counseling)   ASK: We discussed the diagnosis of obesity with Claudia Wall today and Claudia Wall agreed to give [name] permission to discuss obesity behavioral modification therapy today.  ASSESS: Claudia Wall has the diagnosis of obesity and her BMI today is 42.52 Claudia Wall is in the action stage of change   ADVISE: Claudia Wall was educated on the multiple health risks of obesity as well as the benefit of weight loss to improve her health. She was advised of the need for long term treatment and the importance of lifestyle modifications.  AGREE: Multiple dietary modification options and treatment options were discussed and  Claudia Wall agreed to the above obesity treatment plan.   Claudia Wall, am acting as transcriptionist for Lacy Duverney, PA-C I, Lacy Duverney [facility], have reviewed this note and agree with its content

## 2017-08-31 ENCOUNTER — Ambulatory Visit (HOSPITAL_COMMUNITY): Payer: 59 | Admitting: Psychiatry

## 2017-09-10 ENCOUNTER — Ambulatory Visit (INDEPENDENT_AMBULATORY_CARE_PROVIDER_SITE_OTHER): Payer: 59 | Admitting: Orthopaedic Surgery

## 2017-09-14 ENCOUNTER — Ambulatory Visit (INDEPENDENT_AMBULATORY_CARE_PROVIDER_SITE_OTHER): Payer: 59 | Admitting: Family Medicine

## 2017-09-14 ENCOUNTER — Ambulatory Visit (HOSPITAL_COMMUNITY): Payer: 59 | Admitting: Psychiatry

## 2017-09-14 ENCOUNTER — Encounter (INDEPENDENT_AMBULATORY_CARE_PROVIDER_SITE_OTHER): Payer: Self-pay

## 2017-09-15 ENCOUNTER — Ambulatory Visit (INDEPENDENT_AMBULATORY_CARE_PROVIDER_SITE_OTHER): Payer: 59 | Admitting: Physician Assistant

## 2017-09-15 VITALS — BP 130/86 | HR 82 | Temp 99.0°F | Ht 63.0 in | Wt 239.0 lb

## 2017-09-15 DIAGNOSIS — R7303 Prediabetes: Secondary | ICD-10-CM | POA: Diagnosis not present

## 2017-09-15 DIAGNOSIS — Z9189 Other specified personal risk factors, not elsewhere classified: Secondary | ICD-10-CM

## 2017-09-15 DIAGNOSIS — Z6841 Body Mass Index (BMI) 40.0 and over, adult: Secondary | ICD-10-CM

## 2017-09-15 DIAGNOSIS — E559 Vitamin D deficiency, unspecified: Secondary | ICD-10-CM | POA: Diagnosis not present

## 2017-09-15 MED ORDER — VITAMIN D (ERGOCALCIFEROL) 1.25 MG (50000 UNIT) PO CAPS: 50000.0000 [IU] | ORAL_CAPSULE | ORAL | 0 refills | Status: DC

## 2017-09-15 NOTE — Progress Notes (Addendum)
Office: [phone]  /  Fax: [phone]   HPI:   Chief Complaint: OBESITY Claudia Wall is here to discuss her progress with her obesity treatment plan. She is on the portion control better and make smarter food choices, such as increase vegetables and decrease simple carbohydrates and is following her eating plan approximately 60-65 % of the time. She states she is exercising 0 minutes 0 times per week. Claudia Wall continues to do well with weight loss. She was on vacations but managed to make good food choices and controlled her portions.  Her weight is 239 lb (108.4 kg) today and has had a weight loss of 1 pound over a period of 2 to 3 weeks since her last visit. She has lost 4 lbs since starting treatment with [name].  Vitamin D Deficiency Claudia Wall has a diagnosis of vitamin D deficiency. She is currently taking prescription Vit D and denies nausea, vomiting or muscle weakness.  At risk for osteopenia and osteoporosis Claudia Wall is at higher risk of osteopenia and osteoporosis due to vitamin D deficiency.   Pre-Diabetes Claudia Wall has a diagnosis of pre-diabetes based on her elevated Hgb A1c and was informed this puts her at greater risk of developing diabetes. She is taking metformin currently and continues to work on diet and exercise to decrease risk of diabetes. She denies polyphagia, nausea, or hypoglycemia.  ALLERGIES: No Known Allergies  MEDICATIONS: Current Outpatient Medications on File Prior to Visit  Medication Sig Dispense Refill  . diphenhydrAMINE (BENADRYL) 25 mg capsule Take 25 mg by mouth at bedtime as needed.     . doxycycline (VIBRA-TABS) 100 MG tablet Take 1 tablet (100 mg total) by mouth 2 (two) times daily. X 2 weeks 28 tablet 0  . fluconazole (DIFLUCAN) 150 MG tablet Take 1 after starting antibiotics, and second 3 days later.  May repeat after finishing abx course 4 tablet 0  . FLUoxetine (PROZAC) 40 MG capsule Take 1 capsule (40 mg total) by mouth daily. 90 capsule 1  .  hydrochlorothiazide (HYDRODIURIL) 25 MG tablet Take 1 tablet (25 mg total) by mouth daily. 90 tablet 3  . hydrOXYzine (VISTARIL) 25 MG capsule Take 1 capsule (25 mg total) by mouth daily as needed for anxiety. 30 capsule 0  . ibuprofen (ADVIL,MOTRIN) 200 MG tablet Take 600 mg by mouth every 6 (six) hours as needed for headache, mild pain or moderate pain.    . metFORMIN (GLUCOPHAGE) 500 MG tablet Take 1 tablet (500 mg total) by mouth daily with breakfast. 30 tablet 0  . methocarbamol (ROBAXIN) 500 MG tablet Take 1 tablet (500 mg total) by mouth every 8 (eight) hours as needed for muscle spasms. 45 tablet 0  . naproxen sodium (ALEVE) 220 MG tablet Take 220 mg by mouth daily as needed.    . traMADol (ULTRAM) 50 MG tablet Take 1-2 tablets (50-100 mg total) by mouth every 6 (six) hours as needed. 25 tablet 0  . traZODone (DESYREL) 100 MG tablet Take 50-100 mg nightly for sleep 90 tablet 0   No current facility-administered medications on file prior to visit.     PAST MEDICAL HISTORY: Past Medical History:  Diagnosis Date  . Alcohol abuse   . Anxiety   . Back pain   . Breast abscess   . Constipation   . Depression   . Gallbladder problem   . GERD (gastroesophageal reflux disease)   . HTN (hypertension)   . Hypertension   . Irregular periods/menstrual cycles   . Joint pain   .  Lactose intolerance   . Leg edema   . Mastitis   . Obesity   . Obesity   . PCOS (polycystic ovarian syndrome)   . PTSD (post-traumatic stress disorder)   . Swallowing difficulty   . Tobacco abuse   . Tobacco abuse     PAST SURGICAL HISTORY: Past Surgical History:  Procedure Laterality Date  . CHOLECYSTECTOMY  2002  . INCISION AND DRAINAGE BREAST ABSCESS      SOCIAL HISTORY: Social History   Tobacco Use  . Smoking status: Current Every Day Smoker    Packs/day: 0.25    Years: 25.00    Pack years: 6.25    Types: Cigarettes  . Smokeless tobacco: Never Used  . Tobacco comment: "used to smoke 1/2 to  1ppd depending on stress"  Substance Use Topics  . Alcohol use: Yes    Alcohol/week: 1.8 oz    Types: 3 Glasses of wine per week    Comment: occassional  . Drug use: No    FAMILY HISTORY: Family History  Problem Relation Age of Onset  . Cancer Mother   . Hypertension Mother   . Heart disease Mother   . Depression Mother   . Bipolar disorder Mother   . Schizophrenia Mother   . Alcoholism Mother   . Drug abuse Mother   . Obesity Mother   . Hypertension Father   . Diabetes Father     ROS: Review of Systems  Constitutional: Positive for weight loss.  Gastrointestinal: Negative for nausea and vomiting.  Musculoskeletal:       Negative muscle weakness  Endo/Heme/Allergies:       Negative polyphagia Negative hypoglycemia    PHYSICAL EXAM: Blood pressure 130/86, pulse 82, temperature 99 F (37.2 C), height 5\' 3"  (1.6 m), weight 239 lb (108.4 kg), SpO2 99 %. Body mass index is 42.34 kg/m. Physical Exam  Constitutional: She is oriented to person, place, and time. She appears well-developed and well-nourished.  Cardiovascular: Normal rate.  Pulmonary/Chest: Effort normal.  Musculoskeletal: Normal range of motion.  Neurological: She is oriented to person, place, and time.  Skin: Skin is warm and dry.  Psychiatric: She has a normal mood and affect. Her behavior is normal.  Vitals reviewed.   RECENT LABS AND TESTS: BMET    Component Value Date/Time   NA 137 04/29/2017 1138   K 4.3 04/29/2017 1138   CL 98 04/29/2017 1138   CO2 26 04/29/2017 1138   GLUCOSE 83 04/29/2017 1138   GLUCOSE 112 (H) 05/12/2016 1510   BUN 10 04/29/2017 1138   CREATININE 0.92 04/29/2017 1138   CREATININE 0.90 05/12/2016 1510   CALCIUM 9.7 04/29/2017 1138   GFRNONAA 78 04/29/2017 1138   GFRNONAA 80 05/12/2016 1510   GFRAA 89 04/29/2017 1138   GFRAA >89 05/12/2016 1510   Lab Results  Component Value Date   HGBA1C 5.8 (H) 04/29/2017   HGBA1C 5.7 02/15/2014   Lab Results  Component  Value Date   INSULIN 8.6 04/29/2017   CBC    Component Value Date/Time   WBC 7.1 04/29/2017 1138   WBC 6.0 05/12/2016 1510   RBC 4.36 04/29/2017 1138   RBC 4.37 05/12/2016 1510   HGB 13.0 04/29/2017 1138   HCT 40.0 04/29/2017 1138   PLT 334 05/12/2016 1510   MCV 92 04/29/2017 1138   MCH 29.8 04/29/2017 1138   MCH 29.5 05/12/2016 1510   MCHC 32.5 04/29/2017 1138   MCHC 33.2 05/12/2016 1510   RDW 13.7 04/29/2017  1138   LYMPHSABS 3.6 (H) 04/29/2017 1138   MONOABS 540 05/12/2016 1510   EOSABS 0.1 04/29/2017 1138   BASOSABS 0.0 04/29/2017 1138   Iron/TIBC/Ferritin/ %Sat No results found for: IRON, TIBC, FERRITIN, IRONPCTSAT Lipid Panel     Component Value Date/Time   CHOL 193 04/29/2017 1138   TRIG 83 04/29/2017 1138   HDL 54 04/29/2017 1138   CHOLHDL 3.9 Ratio 06/12/2010 1723   VLDL 24 06/12/2010 1723   LDLCALC 122 (H) 04/29/2017 1138   Hepatic Function Panel     Component Value Date/Time   PROT 7.5 04/29/2017 1138   ALBUMIN 4.3 04/29/2017 1138   AST 20 04/29/2017 1138   ALT 28 04/29/2017 1138   ALKPHOS 86 04/29/2017 1138   BILITOT 0.3 04/29/2017 1138      Component Value Date/Time   TSH 0.889 04/29/2017 1138   TSH 1.020 02/11/2017 1522   TSH 0.96 04/08/2016 1612  Results for TEHILLAH, CIPRIANI (MRN [id number]) as of 09/15/2017 14:17  Ref. Range 04/29/2017 11:38  Vitamin D, 25-Hydroxy Latest Ref Range: 30.0 - 100.0 ng/mL 19.6 (L)    ASSESSMENT AND PLAN: Vitamin D deficiency - Plan: Vitamin D, Ergocalciferol, (DRISDOL) 50000 units CAPS capsule  Prediabetes  At risk for osteoporosis  Class 3 severe obesity with serious comorbidity and body mass index (BMI) of 40.0 to 44.9 in adult, unspecified obesity type ([place])  PLAN:  Vitamin D Deficiency Claudia Wall was informed that low vitamin D levels contributes to fatigue and are associated with obesity, breast, and colon cancer. Claudia Wall agrees to continue taking prescription Vit D @50 ,875 IU every week #4 and we will refill  for 1 month. She will follow up for routine testing of vitamin D, at least 2-3 times per year. She was informed of the risk of over-replacement of vitamin D and agrees to not increase her dose unless she discusses this with [name] first. Claudia Wall agrees to follow up with our clinic in 2 weeks.  At risk for osteopenia and osteoporosis Claudia Wall is at risk for osteopenia and osteoporsis due to her vitamin D deficiency. She was encouraged to take her vitamin D and follow her higher calcium diet and increase strengthening exercise to help strengthen her bones and decrease her risk of osteopenia and osteoporosis.  Pre-Diabetes Claudia Wall will continue to work on weight loss, exercise, and decreasing simple carbohydrates in her diet to help decrease the risk of diabetes. We dicussed metformin including benefits and risks. She was informed that eating too many simple carbohydrates or too many calories at one sitting increases the likelihood of GI side effects. Claudia Wall agrees to continue taking metformin and she agrees to follow up with our clinic in 2 weeks as directed to monitor her progress.  Obesity Claudia Wall is currently in the action stage of change. As such, her goal is to continue with weight loss efforts She has agreed to follow the Category 3 plan Claudia Wall has been instructed to work up to a goal of 150 minutes of combined cardio and strengthening exercise per week for weight loss and overall health benefits. We discussed the following Behavioral Modification Strategies today: increasing lean protein intake and work on meal planning and easy cooking plans   Claudia Wall has agreed to follow up with our clinic in 2 weeks. She was informed of the importance of frequent follow up visits to maximize her success with intensive lifestyle modifications for her multiple health conditions.   OBESITY BEHAVIORAL INTERVENTION VISIT  Today's visit was # 8 out of  22.  Starting weight: 243 lbs Starting date: 04/29/17 Today's  weight : 239 lbs Today's date: 09/15/2017 Total lbs lost to date: 4 (Patients must lose 7 lbs in the first 6 months to continue with counseling)   ASK: We discussed the diagnosis of obesity with Claudia Wall Mood today and Claudia Wall agreed to give [name] permission to discuss obesity behavioral modification therapy today.  ASSESS: Deneane has the diagnosis of obesity and her BMI today is 42.35 Emojean is in the action stage of change   ADVISE: Claudia Wall was educated on the multiple health risks of obesity as well as the benefit of weight loss to improve her health. She was advised of the need for long term treatment and the importance of lifestyle modifications.  AGREE: Multiple dietary modification options and treatment options were discussed and  Claudia Wall agreed to the above obesity treatment plan.   Wilhemena Durie, am acting as transcriptionist for Lacy Duverney, PA-C I, Lacy Duverney [facility], have reviewed this note and agree with its content

## 2017-09-17 ENCOUNTER — Ambulatory Visit (INDEPENDENT_AMBULATORY_CARE_PROVIDER_SITE_OTHER): Payer: 59 | Admitting: Orthopaedic Surgery

## 2017-09-22 ENCOUNTER — Telehealth: Payer: Self-pay

## 2017-09-22 NOTE — Telephone Encounter (Signed)
Patient called stating she has a yeast infection. White discharge, itching, and irritation. Has had in past. Requesting diflucan sent to pharmacy without an office visit if possible.  Call back is [phone]  Danley Danker, RN [facility] [place])

## 2017-09-23 ENCOUNTER — Other Ambulatory Visit: Payer: Self-pay | Admitting: Family Medicine

## 2017-09-23 DIAGNOSIS — B3731 Acute candidiasis of vulva and vagina: Secondary | ICD-10-CM

## 2017-09-23 DIAGNOSIS — B373 Candidiasis of vulva and vagina: Secondary | ICD-10-CM

## 2017-09-23 MED ORDER — FLUCONAZOLE 150 MG PO TABS: 150.0000 mg | ORAL_TABLET | Freq: Every day | ORAL | 0 refills | Status: DC

## 2017-09-23 NOTE — Telephone Encounter (Signed)
Rx sent.  David McMullen, DO [facility], PGY-2  

## 2017-09-29 ENCOUNTER — Ambulatory Visit (INDEPENDENT_AMBULATORY_CARE_PROVIDER_SITE_OTHER): Payer: 59 | Admitting: Physician Assistant

## 2017-09-29 VITALS — BP 143/85 | HR 79 | Temp 97.9°F | Ht 63.0 in | Wt 242.0 lb

## 2017-09-29 DIAGNOSIS — E66813 Obesity, class 3: Secondary | ICD-10-CM

## 2017-09-29 DIAGNOSIS — Z6841 Body Mass Index (BMI) 40.0 and over, adult: Secondary | ICD-10-CM

## 2017-09-29 DIAGNOSIS — I10 Essential (primary) hypertension: Secondary | ICD-10-CM

## 2017-09-29 NOTE — Progress Notes (Signed)
Office: [phone]  /  Fax: [phone]   HPI:   Chief Complaint: OBESITY Claudia Wall is here to discuss her progress with her obesity treatment plan. She is on the Category 3 plan and is following her eating plan approximately 80 % of the time. She states she is exercising 0 minutes 0 times per week. Sabriel has not been as mindful of her eating. 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  Her weight is 242 lb (109.8 kg) today and has gained 3 pounds since her last visit. She has lost 1 lbs since starting treatment with [name].  Hypertension Claudia Wall is a 42 y.o. female with hypertension. Tanice's blood pressure is elevated. She has not been taking her blood pressure medications. She has fluid retention in bilateral feet. She denies chest pain or shortness of breath. Discussed medication compliance with her. She is working weight loss to help control her blood pressure with the goal of decreasing her risk of heart attack and stroke. Jacilyn's blood pressure is not currently controlled.  ALLERGIES: No Known Allergies  MEDICATIONS: Current Outpatient Medications on File Prior to Visit  Medication Sig Dispense Refill  . diphenhydrAMINE (BENADRYL) 25 mg capsule Take 25 mg by mouth at bedtime as needed.     . doxycycline (VIBRA-TABS) 100 MG tablet Take 1 tablet (100 mg total) by mouth 2 (two) times daily. X 2 weeks 28 tablet 0  . fluconazole (DIFLUCAN) 150 MG tablet Take 1 tablet (150 mg total) by mouth daily. 2 tablet 0  . FLUoxetine (PROZAC) 40 MG capsule Take 1 capsule (40 mg total) by mouth daily. 90 capsule 1  . hydrochlorothiazide (HYDRODIURIL) 25 MG tablet Take 1 tablet (25 mg total) by mouth daily. 90 tablet 3  . hydrOXYzine (VISTARIL) 25 MG capsule Take 1 capsule (25 mg total) by mouth daily as needed for anxiety. 30 capsule 0  . ibuprofen (ADVIL,MOTRIN) 200 MG tablet Take 600 mg by mouth every 6 (six) hours as needed for headache, mild pain or moderate  pain.    . metFORMIN (GLUCOPHAGE) 500 MG tablet Take 1 tablet (500 mg total) by mouth daily with breakfast. 30 tablet 0  . methocarbamol (ROBAXIN) 500 MG tablet Take 1 tablet (500 mg total) by mouth every 8 (eight) hours as needed for muscle spasms. 45 tablet 0  . naproxen sodium (ALEVE) 220 MG tablet Take 220 mg by mouth daily as needed.    . traMADol (ULTRAM) 50 MG tablet Take 1-2 tablets (50-100 mg total) by mouth every 6 (six) hours as needed. 25 tablet 0  . traZODone (DESYREL) 100 MG tablet Take 50-100 mg nightly for sleep 90 tablet 0  . Vitamin D, Ergocalciferol, (DRISDOL) 50000 units CAPS capsule Take 1 capsule (50,000 Units total) by mouth every 7 (seven) days. 4 capsule 0   No current facility-administered medications on file prior to visit.     PAST MEDICAL HISTORY: Past Medical History:  Diagnosis Date  . Alcohol abuse   . Anxiety   . Back pain   . Breast abscess   . Constipation   . Depression   . Gallbladder problem   . GERD (gastroesophageal reflux disease)   . HTN (hypertension)   . Hypertension   . Irregular periods/menstrual cycles   . Joint pain   . Lactose intolerance   . Leg edema   . Mastitis   . Obesity   . Obesity   . PCOS (polycystic ovarian syndrome)   .  PTSD (post-traumatic stress disorder)   . Swallowing difficulty   . Tobacco abuse   . Tobacco abuse     PAST SURGICAL HISTORY: Past Surgical History:  Procedure Laterality Date  . CHOLECYSTECTOMY  2002  . INCISION AND DRAINAGE BREAST ABSCESS      SOCIAL HISTORY: Social History   Tobacco Use  . Smoking status: Current Every Day Smoker    Packs/day: 0.25    Years: 25.00    Pack years: 6.25    Types: Cigarettes  . Smokeless tobacco: Never Used  . Tobacco comment: "used to smoke 1/2 to 1ppd depending on stress"  Substance Use Topics  . Alcohol use: Yes    Alcohol/week: 1.8 oz    Types: 3 Glasses of wine per week    Comment: occassional  . Drug use: No    FAMILY HISTORY: Family  History  Problem Relation Age of Onset  . Cancer Mother   . Hypertension Mother   . Heart disease Mother   . Depression Mother   . Bipolar disorder Mother   . Schizophrenia Mother   . Alcoholism Mother   . Drug abuse Mother   . Obesity Mother   . Hypertension Father   . Diabetes Father     ROS: Review of Systems  Constitutional: Negative for weight loss.  Respiratory: Negative for shortness of breath.   Cardiovascular: Negative for chest pain.    PHYSICAL EXAM: Blood pressure (!) 143/85, pulse 79, temperature 97.9 F (36.6 C), temperature source Oral, height 5\' 3"  (1.6 m), weight 242 lb (109.8 kg), SpO2 97 %. Body mass index is 42.87 kg/m. Physical Exam  Constitutional: She is oriented to person, place, and time. She appears well-developed and well-nourished.  Cardiovascular: Normal rate.  Pulmonary/Chest: Effort normal.  Musculoskeletal: Normal range of motion.  Neurological: She is oriented to person, place, and time.  Skin: Skin is warm and dry.  Psychiatric: She has a normal mood and affect. Her behavior is normal.  Vitals reviewed.   RECENT LABS AND TESTS: BMET    Component Value Date/Time   NA 137 04/29/2017 1138   K 4.3 04/29/2017 1138   CL 98 04/29/2017 1138   CO2 26 04/29/2017 1138   GLUCOSE 83 04/29/2017 1138   GLUCOSE 112 (H) 05/12/2016 1510   BUN 10 04/29/2017 1138   CREATININE 0.92 04/29/2017 1138   CREATININE 0.90 05/12/2016 1510   CALCIUM 9.7 04/29/2017 1138   GFRNONAA 78 04/29/2017 1138   GFRNONAA 80 05/12/2016 1510   GFRAA 89 04/29/2017 1138   GFRAA >89 05/12/2016 1510   Lab Results  Component Value Date   HGBA1C 5.8 (H) 04/29/2017   HGBA1C 5.7 02/15/2014   Lab Results  Component Value Date   INSULIN 8.6 04/29/2017   CBC    Component Value Date/Time   WBC 7.1 04/29/2017 1138   WBC 6.0 05/12/2016 1510   RBC 4.36 04/29/2017 1138   RBC 4.37 05/12/2016 1510   HGB 13.0 04/29/2017 1138   HCT 40.0 04/29/2017 1138   PLT 334  05/12/2016 1510   MCV 92 04/29/2017 1138   MCH 29.8 04/29/2017 1138   MCH 29.5 05/12/2016 1510   MCHC 32.5 04/29/2017 1138   MCHC 33.2 05/12/2016 1510   RDW 13.7 04/29/2017 1138   LYMPHSABS 3.6 (H) 04/29/2017 1138   MONOABS 540 05/12/2016 1510   EOSABS 0.1 04/29/2017 1138   BASOSABS 0.0 04/29/2017 1138   Iron/TIBC/Ferritin/ %Sat No results found for: IRON, TIBC, FERRITIN, IRONPCTSAT Lipid Panel  Component Value Date/Time   CHOL 193 04/29/2017 1138   TRIG 83 04/29/2017 1138   HDL 54 04/29/2017 1138   CHOLHDL 3.9 Ratio 06/12/2010 1723   VLDL 24 06/12/2010 1723   LDLCALC 122 (H) 04/29/2017 1138   Hepatic Function Panel     Component Value Date/Time   PROT 7.5 04/29/2017 1138   ALBUMIN 4.3 04/29/2017 1138   AST 20 04/29/2017 1138   ALT 28 04/29/2017 1138   ALKPHOS 86 04/29/2017 1138   BILITOT 0.3 04/29/2017 1138      Component Value Date/Time   TSH 0.889 04/29/2017 1138   TSH 1.020 02/11/2017 1522   TSH 0.96 04/08/2016 1612    ASSESSMENT AND PLAN: Essential hypertension  Class 3 severe obesity with serious comorbidity and body mass index (BMI) of 40.0 to 44.9 in adult, unspecified obesity type ([facility])  PLAN:  Hypertension We discussed sodium restriction, working on healthy weight loss, and a regular exercise program as the means to achieve improved blood pressure control. Ahnna agreed with this plan and agreed to follow up as directed. We will continue to monitor her blood pressure as well as her progress with the above lifestyle modifications. She will continue her medications as prescribed and will watch for signs of hypotension as she continues her lifestyle modifications. K[place]ton agrees to follow up with our clinic in 2 weeks.  We spent > than 50% of the 15 minute visit on the counseling as documented in the note.  Obesity Riely is currently in the action stage of change. As such, her goal is to continue with weight loss efforts She has agreed to portion  control better and make smarter food choices, such as increase vegetables and decrease simple carbohydrates  Zayonna has been instructed to work up to a goal of 150 minutes of combined cardio and strengthening exercise per week for weight loss and overall health benefits. We discussed the following Behavioral Modification Strategies today: increasing lean protein intake and planning for success   Shanyiah has agreed to follow up with our clinic in 2 weeks. She was informed of the importance of frequent follow up visits to maximize her success with intensive lifestyle modifications for her multiple health conditions.   OBESITY BEHAVIORAL INTERVENTION VISIT  Today's visit was # 9 out of 22.  Starting weight: 243 lbs Starting date: 04/29/17 Today's weight : 242 lbs Today's date: 09/29/2017 Total lbs lost to date: 1 (Patients must lose 7 lbs in the first 6 months to continue with counseling)   ASK: We discussed the diagnosis of obesity with Augustina Mood today and Triva agreed to give [name] permission to discuss obesity behavioral modification therapy today.  ASSESS: Kaiyah has the diagnosis of obesity and her BMI today is 42.88 Zyra is in the action stage of change   ADVISE: Ceaira was educated on the multiple health risks of obesity as well as the benefit of weight loss to improve her health. She was advised of the need for long term treatment and the importance of lifestyle modifications.  AGREE: Multiple dietary modification options and treatment options were discussed and  Nixie agreed to the above obesity treatment plan.   Wilhemena Durie, am acting as transcriptionist for Lacy Duverney, PA-C I, Lacy Duverney [facility], have reviewed this note and agree with its content

## 2017-10-15 ENCOUNTER — Ambulatory Visit (INDEPENDENT_AMBULATORY_CARE_PROVIDER_SITE_OTHER): Payer: 59 | Admitting: Orthopaedic Surgery

## 2017-10-19 ENCOUNTER — Ambulatory Visit (INDEPENDENT_AMBULATORY_CARE_PROVIDER_SITE_OTHER): Payer: 59 | Admitting: Physician Assistant

## 2017-10-19 VITALS — BP 152/90 | HR 82 | Temp 98.5°F | Ht 63.0 in | Wt 241.0 lb

## 2017-10-19 DIAGNOSIS — E559 Vitamin D deficiency, unspecified: Secondary | ICD-10-CM | POA: Diagnosis not present

## 2017-10-19 DIAGNOSIS — Z6841 Body Mass Index (BMI) 40.0 and over, adult: Secondary | ICD-10-CM | POA: Diagnosis not present

## 2017-10-19 DIAGNOSIS — E66813 Obesity, class 3: Secondary | ICD-10-CM

## 2017-10-19 DIAGNOSIS — Z9189 Other specified personal risk factors, not elsewhere classified: Secondary | ICD-10-CM

## 2017-10-19 DIAGNOSIS — I10 Essential (primary) hypertension: Secondary | ICD-10-CM | POA: Diagnosis not present

## 2017-10-19 MED ORDER — VITAMIN D (ERGOCALCIFEROL) 1.25 MG (50000 UNIT) PO CAPS: 50000.0000 [IU] | ORAL_CAPSULE | ORAL | 0 refills | Status: DC

## 2017-10-19 NOTE — Progress Notes (Signed)
Office: [phone]  /  Fax: [phone]   HPI:   Chief Complaint: OBESITY Claudia Wall is here to discuss her progress with her obesity treatment plan. She is on the portion control better and make smarter food choices, such as increase vegetables and decrease simple carbohydrates and is following her eating plan approximately 75 % of the time. She states she is on the treadmill for 15 minutes 7 times per week. Claudia Wall makes better food choices and controls her portions. She states she is not getting the recommended protein.  Her weight is 241 lb (109.3 kg) today and has had a weight loss of 1 pound over a period of 2 to 3 weeks since her last visit. She has lost 2 lbs since starting treatment with [name].  Hypertension Claudia Wall is a 42 y.o. female with hypertension. Claudia Wall's blood pressure is elevated. She continues to decline any adjustments to her blood pressure medications. She denies chest pain or shortness of breath. She is working weight loss to help control her blood pressure with the goal of decreasing her risk of heart attack and stroke. Claudia Wall's blood pressure is not currently controlled.  Vitamin D Deficiency Claudia Wall has a diagnosis of vitamin D deficiency. She is currently taking prescription Vit D. She was advised to have repeat Vit D at next visit but declines repeat labs at next visit. She denies nausea, vomiting or muscle weakness.  At risk for osteopenia and osteoporosis Claudia Wall is at higher risk of osteopenia and osteoporosis due to vitamin D deficiency.   ALLERGIES: No Known Allergies  MEDICATIONS: Current Outpatient Medications on File Prior to Visit  Medication Sig Dispense Refill  . diphenhydrAMINE (BENADRYL) 25 mg capsule Take 25 mg by mouth at bedtime as needed.     . doxycycline (VIBRA-TABS) 100 MG tablet Take 1 tablet (100 mg total) by mouth 2 (two) times daily. X 2 weeks 28 tablet 0  . fluconazole (DIFLUCAN) 150 MG tablet Take 1 tablet (150 mg total) by mouth  daily. 2 tablet 0  . FLUoxetine (PROZAC) 40 MG capsule Take 1 capsule (40 mg total) by mouth daily. 90 capsule 1  . hydrochlorothiazide (HYDRODIURIL) 25 MG tablet Take 1 tablet (25 mg total) by mouth daily. 90 tablet 3  . hydrOXYzine (VISTARIL) 25 MG capsule Take 1 capsule (25 mg total) by mouth daily as needed for anxiety. 30 capsule 0  . ibuprofen (ADVIL,MOTRIN) 200 MG tablet Take 600 mg by mouth every 6 (six) hours as needed for headache, mild pain or moderate pain.    . metFORMIN (GLUCOPHAGE) 500 MG tablet Take 1 tablet (500 mg total) by mouth daily with breakfast. 30 tablet 0  . methocarbamol (ROBAXIN) 500 MG tablet Take 1 tablet (500 mg total) by mouth every 8 (eight) hours as needed for muscle spasms. 45 tablet 0  . naproxen sodium (ALEVE) 220 MG tablet Take 220 mg by mouth daily as needed.    . traMADol (ULTRAM) 50 MG tablet Take 1-2 tablets (50-100 mg total) by mouth every 6 (six) hours as needed. 25 tablet 0  . traZODone (DESYREL) 100 MG tablet Take 50-100 mg nightly for sleep 90 tablet 0  . Vitamin D, Ergocalciferol, (DRISDOL) 50000 units CAPS capsule Take 1 capsule (50,000 Units total) by mouth every 7 (seven) days. 4 capsule 0   No current facility-administered medications on file prior to visit.     PAST MEDICAL HISTORY: Past Medical History:  Diagnosis Date  . Alcohol abuse   . Anxiety   .  Back pain   . Breast abscess   . Constipation   . Depression   . Gallbladder problem   . GERD (gastroesophageal reflux disease)   . HTN (hypertension)   . Hypertension   . Irregular periods/menstrual cycles   . Joint pain   . Lactose intolerance   . Leg edema   . Mastitis   . Obesity   . Obesity   . PCOS (polycystic ovarian syndrome)   . PTSD (post-traumatic stress disorder)   . Swallowing difficulty   . Tobacco abuse   . Tobacco abuse     PAST SURGICAL HISTORY: Past Surgical History:  Procedure Laterality Date  . CHOLECYSTECTOMY  2002  . INCISION AND DRAINAGE BREAST  ABSCESS      SOCIAL HISTORY: Social History   Tobacco Use  . Smoking status: Current Every Day Smoker    Packs/day: 0.25    Years: 25.00    Pack years: 6.25    Types: Cigarettes  . Smokeless tobacco: Never Used  . Tobacco comment: "used to smoke 1/2 to 1ppd depending on stress"  Substance Use Topics  . Alcohol use: Yes    Alcohol/week: 1.8 oz    Types: 3 Glasses of wine per week    Comment: occassional  . Drug use: No    FAMILY HISTORY: Family History  Problem Relation Age of Onset  . Cancer Mother   . Hypertension Mother   . Heart disease Mother   . Depression Mother   . Bipolar disorder Mother   . Schizophrenia Mother   . Alcoholism Mother   . Drug abuse Mother   . Obesity Mother   . Hypertension Father   . Diabetes Father     ROS: Review of Systems  Constitutional: Positive for weight loss.  Respiratory: Negative for shortness of breath.   Cardiovascular: Negative for chest pain.  Gastrointestinal: Negative for nausea and vomiting.  Musculoskeletal:       Negative muscle weakness    PHYSICAL EXAM: Blood pressure (!) 152/90, pulse 82, temperature 98.5 F (36.9 C), temperature source Oral, height 5\' 3"  (1.6 m), weight 241 lb (109.3 kg), SpO2 98 %. Body mass index is 42.69 kg/m. Physical Exam  Constitutional: She is oriented to person, place, and time. She appears well-developed and well-nourished.  Cardiovascular: Normal rate.  Pulmonary/Chest: Effort normal.  Musculoskeletal: Normal range of motion.  Neurological: She is oriented to person, place, and time.  Skin: Skin is warm and dry.  Psychiatric: She has a normal Wall and affect. Her behavior is normal.  Vitals reviewed.   RECENT LABS AND TESTS: BMET    Component Value Date/Time   NA 137 04/29/2017 1138   K 4.3 04/29/2017 1138   CL 98 04/29/2017 1138   CO2 26 04/29/2017 1138   GLUCOSE 83 04/29/2017 1138   GLUCOSE 112 (H) 05/12/2016 1510   BUN 10 04/29/2017 1138   CREATININE 0.92  04/29/2017 1138   CREATININE 0.90 05/12/2016 1510   CALCIUM 9.7 04/29/2017 1138   GFRNONAA 78 04/29/2017 1138   GFRNONAA 80 05/12/2016 1510   GFRAA 89 04/29/2017 1138   GFRAA >89 05/12/2016 1510   Lab Results  Component Value Date   HGBA1C 5.8 (H) 04/29/2017   HGBA1C 5.7 02/15/2014   Lab Results  Component Value Date   INSULIN 8.6 04/29/2017   CBC    Component Value Date/Time   WBC 7.1 04/29/2017 1138   WBC 6.0 05/12/2016 1510   RBC 4.36 04/29/2017 1138   RBC  4.37 05/12/2016 1510   HGB 13.0 04/29/2017 1138   HCT 40.0 04/29/2017 1138   PLT 334 05/12/2016 1510   MCV 92 04/29/2017 1138   MCH 29.8 04/29/2017 1138   MCH 29.5 05/12/2016 1510   MCHC 32.5 04/29/2017 1138   MCHC 33.2 05/12/2016 1510   RDW 13.7 04/29/2017 1138   LYMPHSABS 3.6 (H) 04/29/2017 1138   MONOABS 540 05/12/2016 1510   EOSABS 0.1 04/29/2017 1138   BASOSABS 0.0 04/29/2017 1138   Iron/TIBC/Ferritin/ %Sat No results found for: IRON, TIBC, FERRITIN, IRONPCTSAT Lipid Panel     Component Value Date/Time   CHOL 193 04/29/2017 1138   TRIG 83 04/29/2017 1138   HDL 54 04/29/2017 1138   CHOLHDL 3.9 Ratio 06/12/2010 1723   VLDL 24 06/12/2010 1723   LDLCALC 122 (H) 04/29/2017 1138   Hepatic Function Panel     Component Value Date/Time   PROT 7.5 04/29/2017 1138   ALBUMIN 4.3 04/29/2017 1138   AST 20 04/29/2017 1138   ALT 28 04/29/2017 1138   ALKPHOS 86 04/29/2017 1138   BILITOT 0.3 04/29/2017 1138      Component Value Date/Time   TSH 0.889 04/29/2017 1138   TSH 1.020 02/11/2017 1522   TSH 0.96 04/08/2016 1612  Results for KERRIANNE, JENG (MRN [id number]) as of 10/19/2017 13:00  Ref. Range 04/29/2017 11:38  Vitamin D, 25-Hydroxy Latest Ref Range: 30.0 - 100.0 ng/mL 19.6 (L)    ASSESSMENT AND PLAN: Vitamin D deficiency - Plan: Vitamin D, Ergocalciferol, (DRISDOL) 50000 units CAPS capsule  Essential hypertension  At risk for osteoporosis  Class 3 severe obesity with serious comorbidity and  body mass index (BMI) of 40.0 to 44.9 in adult, unspecified obesity type (HCC)  PLAN:  Hypertension We discussed sodium restriction, working on healthy weight loss, and a regular exercise program as the means to achieve improved blood pressure control. Claudia Wall agreed with this plan and agreed to follow up as directed. We will continue to monitor her blood pressure as well as her progress with the above lifestyle modifications. She will continue her medications and will watch for signs of hypotension as she continues her lifestyle modifications. Claudia Wall agrees to follow up with our clinic in 2 weeks.  Vitamin D Deficiency Claudia Wall was informed that low vitamin D levels contributes to fatigue and are associated with obesity, breast, and colon cancer. Pearley agrees to continue taking prescription Vit D @50 ,000 IU every week #4 and we will refill for 1 month. She will follow up for routine testing of vitamin D, at least 2-3 times per year. She was informed of the risk of over-replacement of vitamin D and agrees to not increase her dose unless she discusses this with [name] first. Claudia Wall agrees to follow up with our clinic in 2 weeks.  At risk for osteopenia and osteoporosis Claudia Wall is at risk for osteopenia and osteoporsis due to her vitamin D deficiency. She was encouraged to take her vitamin D and follow her higher calcium diet and increase strengthening exercise to help strengthen her bones and decrease her risk of osteopenia and osteoporosis.  Obesity Claudia Wall is currently in the action stage of change. As such, her goal is to continue with weight loss efforts She has agreed to follow the Category 2 plan Claudia Wall has been instructed to work up to a goal of 150 minutes of combined cardio and strengthening exercise per week for weight loss and overall health benefits. We discussed the following Behavioral Modification Strategies today: increasing lean protein  intake and work on meal planning and easy cooking  plans   Claudia Wall has agreed to follow up with our clinic in 2 weeks. She was informed of the importance of frequent follow up visits to maximize her success with intensive lifestyle modifications for her multiple health conditions.   OBESITY BEHAVIORAL INTERVENTION VISIT  Today's visit was # 10 out of 22.  Starting weight: 243 lbs Starting date: 04/29/17 Today's weight : 241 lbs Today's date: 10/19/2017 Total lbs lost to date: 2 (Patients must lose 7 lbs in the first 6 months to continue with counseling)   ASK: We discussed the diagnosis of obesity with Claudia Wall today and Claudia Wall agreed to give [name] permission to discuss obesity behavioral modification therapy today.  ASSESS: Claudia Wall has the diagnosis of obesity and her BMI today is 42.7 Claudia Wall is in the action stage of change   ADVISE: Claudia Wall was educated on the multiple health risks of obesity as well as the benefit of weight loss to improve her health. She was advised of the need for long term treatment and the importance of lifestyle modifications.  AGREE: Multiple dietary modification options and treatment options were discussed and  Claudia Wall agreed to the above obesity treatment plan.   Claudia Wall, am acting as transcriptionist for Lacy Duverney, PA-C I, Lacy Duverney [facility], have reviewed this note and agree with its content

## 2017-10-21 ENCOUNTER — Ambulatory Visit (INDEPENDENT_AMBULATORY_CARE_PROVIDER_SITE_OTHER): Payer: 59 | Admitting: Orthopaedic Surgery

## 2017-10-29 ENCOUNTER — Other Ambulatory Visit: Payer: Self-pay | Admitting: Family Medicine

## 2017-10-29 NOTE — Telephone Encounter (Signed)
Pt needs a new referral to be seen by Dr. Leane Call podiatrists. Plus she used a cream called Kenalog a while back for her feet and wanting to know if she can get a refill on this. jw

## 2017-10-30 ENCOUNTER — Other Ambulatory Visit: Payer: Self-pay | Admitting: Family Medicine

## 2017-10-30 NOTE — Progress Notes (Signed)
Entered in error

## 2017-10-30 NOTE — Telephone Encounter (Signed)
Unsure why she needs to see podiatry or Kenalog cream.  These have patient schedule appointment to receive evaluation for her feet at which point we can appropriately give her the correct medication and referral to podiatry if indicated.  Harriet Butte, [place], PGY-3

## 2017-11-02 NOTE — Telephone Encounter (Signed)
Pt informed and scheduled for an appt. Deseree Blount, CMA  

## 2017-11-03 ENCOUNTER — Ambulatory Visit (INDEPENDENT_AMBULATORY_CARE_PROVIDER_SITE_OTHER): Payer: 59 | Admitting: Physician Assistant

## 2017-11-03 VITALS — BP 134/91 | HR 79 | Temp 97.9°F | Ht 63.0 in | Wt 240.0 lb

## 2017-11-03 DIAGNOSIS — E559 Vitamin D deficiency, unspecified: Secondary | ICD-10-CM

## 2017-11-03 DIAGNOSIS — Z6841 Body Mass Index (BMI) 40.0 and over, adult: Secondary | ICD-10-CM | POA: Diagnosis not present

## 2017-11-03 DIAGNOSIS — E66813 Obesity, class 3: Secondary | ICD-10-CM

## 2017-11-03 NOTE — Progress Notes (Signed)
Office: [phone]  /  Fax: [phone]   HPI:   Chief Complaint: OBESITY Claudia Wall is here to discuss her progress with her obesity treatment plan. She is on the Category 3 plan and is following her eating plan approximately 50 % of the time. She states she is exercising 0 minutes 0 times per week. Claudia Wall is struggling to follow Category 3 plan. She reports drinking alcohol in excess of her snack calories. She also states that she is struggling to get in all the protein at dinner.  Her weight is 240 lb (108.9 kg) today and has had Claudia weight loss of 1 pound over Claudia period of 2 weeks since her last visit. She has lost 3 lbs since starting treatment with [name].  Vitamin D Deficiency Claudia Wall has Claudia diagnosis of vitamin D deficiency. She is currently taking prescription Vit D, last level not at goal. She denies nausea, vomiting or muscle weakness.  ALLERGIES: No Known Allergies  MEDICATIONS: Current Outpatient Medications on File Prior to Visit  Medication Sig Dispense Refill  . diphenhydrAMINE (BENADRYL) 25 mg capsule Take 25 mg by mouth at bedtime as needed.     Marland Kitchen FLUoxetine (PROZAC) 40 MG capsule Take 1 capsule (40 mg total) by mouth daily. 90 capsule 1  . hydrochlorothiazide (HYDRODIURIL) 25 MG tablet Take 1 tablet (25 mg total) by mouth daily. 90 tablet 3  . hydrOXYzine (VISTARIL) 25 MG capsule Take 1 capsule (25 mg total) by mouth daily as needed for anxiety. 30 capsule 0  . ibuprofen (ADVIL,MOTRIN) 200 MG tablet Take 600 mg by mouth every 6 (six) hours as needed for headache, mild pain or moderate pain.    . metFORMIN (GLUCOPHAGE) 500 MG tablet Take 1 tablet (500 mg total) by mouth daily with breakfast. 30 tablet 0  . methocarbamol (ROBAXIN) 500 MG tablet Take 1 tablet (500 mg total) by mouth every 8 (eight) hours as needed for muscle spasms. 45 tablet 0  . naproxen sodium (ALEVE) 220 MG tablet Take 220 mg by mouth daily as needed.    . traMADol (ULTRAM) 50 MG tablet Take 1-2 tablets  (50-100 mg total) by mouth every 6 (six) hours as needed. 25 tablet 0  . traZODone (DESYREL) 100 MG tablet Take 50-100 mg nightly for sleep 90 tablet 0  . Vitamin D, Ergocalciferol, (DRISDOL) 50000 units CAPS capsule Take 1 capsule (50,000 Units total) by mouth every 7 (seven) days. 4 capsule 0   No current facility-administered medications on file prior to visit.     PAST MEDICAL HISTORY: Past Medical History:  Diagnosis Date  . Alcohol abuse   . Anxiety   . Back pain   . Breast abscess   . Constipation   . Depression   . Gallbladder problem   . GERD (gastroesophageal reflux disease)   . HTN (hypertension)   . Hypertension   . Irregular periods/menstrual cycles   . Joint pain   . Lactose intolerance   . Leg edema   . Mastitis   . Obesity   . Obesity   . PCOS (polycystic ovarian syndrome)   . PTSD (post-traumatic stress disorder)   . Swallowing difficulty   . Tobacco abuse   . Tobacco abuse     PAST SURGICAL HISTORY: Past Surgical History:  Procedure Laterality Date  . CHOLECYSTECTOMY  2002  . INCISION AND DRAINAGE BREAST ABSCESS      SOCIAL HISTORY: Social History   Tobacco Use  . Smoking status: Current Every Day Smoker  Packs/day: 0.25    Years: 25.00    Pack years: 6.25    Types: Cigarettes  . Smokeless tobacco: Never Used  . Tobacco comment: "used to smoke 1/2 to 1ppd depending on stress"  Substance Use Topics  . Alcohol use: Yes    Alcohol/week: 1.8 oz    Types: 3 Glasses of wine per week    Comment: occassional  . Drug use: No    FAMILY HISTORY: Family History  Problem Relation Age of Onset  . Cancer Mother   . Hypertension Mother   . Heart disease Mother   . Depression Mother   . Bipolar disorder Mother   . Schizophrenia Mother   . Alcoholism Mother   . Drug abuse Mother   . Obesity Mother   . Hypertension Father   . Diabetes Father     ROS: Review of Systems  Constitutional: Positive for weight loss.  Gastrointestinal: Negative  for nausea and vomiting.  Musculoskeletal:       Negative muscle weakness    PHYSICAL EXAM: Blood pressure (!) 134/91, pulse 79, temperature 97.9 F (36.6 C), temperature source Oral, height 5\' 3"  (1.6 m), weight 240 lb (108.9 kg), SpO2 96 %. Body mass index is 42.51 kg/m. Physical Exam  Constitutional: She is oriented to person, place, and time. She appears well-developed and well-nourished.  Cardiovascular: Normal rate.  Pulmonary/Chest: Effort normal.  Musculoskeletal: Normal range of motion.  Neurological: She is oriented to person, place, and time.  Skin: Skin is warm and dry.  Psychiatric: She has Claudia normal Wall and affect. Her behavior is normal.  Vitals reviewed.   RECENT LABS AND TESTS: BMET    Component Value Date/Time   NA 137 04/29/2017 1138   K 4.3 04/29/2017 1138   CL 98 04/29/2017 1138   CO2 26 04/29/2017 1138   GLUCOSE 83 04/29/2017 1138   GLUCOSE 112 (H) 05/12/2016 1510   BUN 10 04/29/2017 1138   CREATININE 0.92 04/29/2017 1138   CREATININE 0.90 05/12/2016 1510   CALCIUM 9.7 04/29/2017 1138   GFRNONAA 78 04/29/2017 1138   GFRNONAA 80 05/12/2016 1510   GFRAA 89 04/29/2017 1138   GFRAA >89 05/12/2016 1510   Lab Results  Component Value Date   HGBA1C 5.8 (H) 04/29/2017   HGBA1C 5.7 02/15/2014   Lab Results  Component Value Date   INSULIN 8.6 04/29/2017   CBC    Component Value Date/Time   WBC 7.1 04/29/2017 1138   WBC 6.0 05/12/2016 1510   RBC 4.36 04/29/2017 1138   RBC 4.37 05/12/2016 1510   HGB 13.0 04/29/2017 1138   HCT 40.0 04/29/2017 1138   PLT 334 05/12/2016 1510   MCV 92 04/29/2017 1138   MCH 29.8 04/29/2017 1138   MCH 29.5 05/12/2016 1510   MCHC 32.5 04/29/2017 1138   MCHC 33.2 05/12/2016 1510   RDW 13.7 04/29/2017 1138   LYMPHSABS 3.6 (H) 04/29/2017 1138   MONOABS 540 05/12/2016 1510   EOSABS 0.1 04/29/2017 1138   BASOSABS 0.0 04/29/2017 1138   Iron/TIBC/Ferritin/ %Sat No results found for: IRON, TIBC, FERRITIN,  IRONPCTSAT Lipid Panel     Component Value Date/Time   CHOL 193 04/29/2017 1138   TRIG 83 04/29/2017 1138   HDL 54 04/29/2017 1138   CHOLHDL 3.9 Ratio 06/12/2010 1723   VLDL 24 06/12/2010 1723   LDLCALC 122 (H) 04/29/2017 1138   Hepatic Function Panel     Component Value Date/Time   PROT 7.5 04/29/2017 1138   ALBUMIN  4.3 04/29/2017 1138   AST 20 04/29/2017 1138   ALT 28 04/29/2017 1138   ALKPHOS 86 04/29/2017 1138   BILITOT 0.3 04/29/2017 1138      Component Value Date/Time   TSH 0.889 04/29/2017 1138   TSH 1.020 02/11/2017 1522   TSH 0.96 04/08/2016 1612  Results for MISTEY, HOFFERT (MRN [id number]) as of 11/03/2017 12:41  Ref. Range 04/29/2017 11:38  Vitamin D, 25-Hydroxy Latest Ref Range: 30.0 - 100.0 ng/mL 19.6 (L)    ASSESSMENT AND PLAN: Vitamin D deficiency  Class 3 severe obesity with serious comorbidity and body mass index (BMI) of 40.0 to 44.9 in adult, unspecified obesity type ([place])  PLAN:  Vitamin D Deficiency Claudia Wall was informed that low vitamin D levels contributes to fatigue and are associated with obesity, breast, and colon cancer. Claudia Wall agrees to continue taking prescription Vit D @50 ,000 IU every week and will follow up for routine testing of vitamin D, at least 2-3 times per year. She was informed of the risk of over-replacement of vitamin D and agrees to not increase her dose unless she discusses this with [name] first. Claudia Wall agrees to follow up with our clinic in 2 weeks and we will check labs at that time.  We spent > than 50% of the 15 minute visit on the counseling as documented in the note.  Obesity Claudia Wall is currently in the action stage of change. As such, her goal is to continue with weight loss efforts She has agreed to follow the Category 3 plan + 200 calories Claudia Wall has been instructed to work up to Claudia goal of 150 minutes of combined cardio and strengthening exercise per week for weight loss and overall health benefits. We discussed the  following Behavioral Modification Strategies today: increasing lean protein intake and no skipping meals   Claudia Wall has agreed to follow up with our clinic in 2 weeks. She was informed of the importance of frequent follow up visits to maximize her success with intensive lifestyle modifications for her multiple health conditions.   OBESITY BEHAVIORAL INTERVENTION VISIT  Today's visit was # 11 out of 22.  Starting weight: 243 lbs Starting date: 04/29/17 Today's weight : 240 lbs  Today's date: 11/03/2017 Total lbs lost to date: 3    ASK: We discussed the diagnosis of obesity with Claudia Wall today and Claudia Wall agreed to give [name] permission to discuss obesity behavioral modification therapy today.  ASSESS: Claudia Wall has the diagnosis of obesity and her BMI today is 42.52 Claudia Wall is in the action stage of change   ADVISE: Claudia Wall was educated on the multiple health risks of obesity as well as the benefit of weight loss to improve her health. She was advised of the need for long term treatment and the importance of lifestyle modifications.  AGREE: Multiple dietary modification options and treatment options were discussed and  Claudia Wall agreed to the above obesity treatment plan.  Claudia Wall, am acting as transcriptionist for Abby Potash, PA-C I, Abby Potash, PA-C have reviewed above note and agree with its content

## 2017-11-09 ENCOUNTER — Ambulatory Visit: Payer: 59 | Admitting: Family Medicine

## 2017-11-09 ENCOUNTER — Encounter: Payer: Self-pay | Admitting: Family Medicine

## 2017-11-09 ENCOUNTER — Other Ambulatory Visit: Payer: Self-pay

## 2017-11-09 VITALS — BP 136/80 | HR 93 | Ht 63.0 in | Wt 246.8 lb

## 2017-11-09 DIAGNOSIS — F5102 Adjustment insomnia: Secondary | ICD-10-CM

## 2017-11-09 DIAGNOSIS — G8929 Other chronic pain: Secondary | ICD-10-CM | POA: Diagnosis not present

## 2017-11-09 DIAGNOSIS — M79675 Pain in left toe(s): Secondary | ICD-10-CM | POA: Diagnosis not present

## 2017-11-09 DIAGNOSIS — M5442 Lumbago with sciatica, left side: Secondary | ICD-10-CM | POA: Diagnosis not present

## 2017-11-09 MED ORDER — METHOCARBAMOL 500 MG PO TABS: 500.0000 mg | ORAL_TABLET | Freq: Three times a day (TID) | ORAL | 0 refills | Status: DC | PRN

## 2017-11-09 NOTE — Patient Instructions (Signed)
Thank you for coming in to see [name] today. Please see below to review our plan for today's visit.  1.  I have placed a referral for you to see podiatry regarding her left toe pain. 2.  The Robaxin was refilled for your chronic back pain.  Please understand this is a temporary medication and will likely not be beneficial in the long-term.  Weight loss is key along with regular exercise. 3.  I will follow-up your cholesterol at your next visit to decide whether or not you are a candidate for statin therapy. 4.  Concerning her sleep, I have placed a referral for a sleep study.  They will contact you to schedule the appointment.  In the meantime, follow the sections below to improve your sleep.  Do not take trazodone or other sedating medications including Benadryl.  Insomnia is a common problem (affects 10% of the population). Sleep hygiene is key to helping restore your sleep. Here are some basic sleep hygiene tips:  No electronic devices or TV in bed  Keep bedroom dark with comfortable cool temperature  No caffeine after 2-4pm  No alcohol within 6 hours of bedtime  No nicotine prior to bedtime  Avoid heavy meal and lots of liquid right before bed  Avoid naps or keep them brief (<30 mins)  Avoid lots of physical activity before sleep  Remember bed is for the 2 S's (sleep and sex)  Make a routine (bath, reading a book, etc.) prior to bed  Set a consistent wake time for all 7 days of the week  You can supplement with mindfulness meditation like deep breathing, stretching, low-intensity yoga, relaxing music  Please call the clinic at [phone] if your symptoms worsen or you have any concerns. It was our pleasure to serve you.  Harriet Butte, [place], PGY-3

## 2017-11-09 NOTE — Assessment & Plan Note (Signed)
Chronic.  Stable.  Likely due to underlying obesity.  Currently making efforts with weight loss program.  No history of imaging of back.  Patient without sciatica today. - Given refill for Robaxin and encouraged to continue weight loss effort with wellness program

## 2017-11-09 NOTE — Assessment & Plan Note (Signed)
Chronic.  No signs of toe infection.  Does have known prediabetes. - Ambulatory referral to podiatry - Advised to wear good footwear with plenty of space for toes

## 2017-11-09 NOTE — Assessment & Plan Note (Addendum)
Chronic.  Likely multifactorial given increased stressors including job, raising a child, and other medical comorbidities.  Given history of weight, would rule out OSA. - Referral for nocturnal polysomnography - Continue following up with [facility]

## 2017-11-09 NOTE — Progress Notes (Signed)
Subjective   Patient ID: Claudia Wall    DOB: December 10, 1975, 42 y.o. female   MRN: [id number]  CC: "Need a referral"  HPI: Claudia Wall is a 42 y.o. female who presents to clinic today for the following:  Left toe pain: Ongoing issue for "many years."  Patient here today because she recently received with insurance and was advised to find a podiatrist by her friend.  Patient has long-standing foot problems which she attributes to her toenails.  She reports loss of her entire right toenail several weeks ago.  She denies fevers or chills, erythema or edema on her left toe.  Does have known prediabetes.  Insomnia: Long-standing due to multiple stressors.  Worsening over the last month.  She endorses daytime sleepiness around 2 PM.  Patient currently working 2 jobs between the hours of 7 AM and 7 PM.  Patient usually goes to sleep around midnight and often wakes up due to nocturia and numbness in her hands from her carpal tunnel.  She reports use of Benadryl in the past to help her with initiation of sleep and has long-standing use of trazodone.  Patient denies shortness of breath, PND, orthopnea.  Chronic back pain: Localized to left lower back.  Symptoms improved with use of Robaxin.  She is requesting refill today.  She reports no changes to her back pain since her last visit.  She denies loss of motor function or sensory loss, saddle anesthesia.  ROS: see HPI for pertinent.  [place]: Obesity, HTN, GERD, eczema, anxiety, tobacco use disorder. Surgical cholecystectomy, I&D of breast abscess. Family mother cancer, HTN, father HTN. Smoking status reviewed. Medications reviewed.  Objective   BP 136/80   Pulse 93   Ht 5\' 3"  (1.6 m)   Wt 246 lb 12.8 oz (111.9 kg)   LMP 10/23/2017 (Approximate)   SpO2 98%   BMI 43.72 kg/m  Vitals and nursing note reviewed.  General: well nourished, well developed, NAD with non-toxic appearance HEENT: normocephalic, atraumatic, moist mucous membranes Neck:  supple, non-tender without lymphadenopathy Cardiovascular: regular rate and rhythm without murmurs, rubs, or gallops Lungs: clear to auscultation bilaterally with normal work of breathing Abdomen: obese, soft, non-tender, non-distended, normoactive bowel sounds Skin: warm, dry, no rashes or lesions, cap refill < 2 seconds Extremities: warm and well perfused, normal tone, no edema, feet with fake nails laterally and no signs of erythema or edema to left big toe MSK: mild left lumbar tenderness upon palpation, 5/5 motor strength in all 4 extremities  Assessment & Plan   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  Likely multifactorial given increased stressors including job, raising a child, and other medical comorbidities.  Given history of weight, would rule out OSA. - Referral for nocturnal polysomnography - Continue following up with [facility] and wellness program  Lumbago with sciatica, left side Chronic.  Stable.  Likely due to underlying obesity.  Currently making efforts with weight loss program.  No history of imaging of back.  Patient without sciatica today. - Given refill for Robaxin and encouraged to continue weight loss effort with wellness program  Chronic toe pain, left foot Chronic.  No signs of toe infection.  Does have known prediabetes. - Ambulatory referral to podiatry - Advised to wear good footwear with plenty of space for toes  Orders Placed This Encounter  Procedures  . Ambulatory referral to Podiatry    Referral Priority:   Routine    Referral Type:   Consultation  Referral Reason:   Specialty Services Required    Requested Specialty:   Podiatry    Number of Visits Requested:   1  . Nocturnal polysomnography (NPSG)    Standing Status:   Future    Standing Expiration Date:   11/10/2018    Order Specific Question:   Where should this test be performed:    Answer:   [facility] ordered this encounter  Medications  . methocarbamol  (ROBAXIN) 500 MG tablet    Sig: Take 1 tablet (500 mg total) by mouth every 8 (eight) hours as needed for muscle spasms.    Dispense:  45 tablet    Refill:  0    Harriet Butte, DO [place], PGY-3 11/09/2017, 1:44 PM

## 2017-11-13 ENCOUNTER — Ambulatory Visit (INDEPENDENT_AMBULATORY_CARE_PROVIDER_SITE_OTHER): Payer: 59 | Admitting: Orthopaedic Surgery

## 2017-11-13 ENCOUNTER — Encounter (INDEPENDENT_AMBULATORY_CARE_PROVIDER_SITE_OTHER): Payer: Self-pay | Admitting: Orthopaedic Surgery

## 2017-11-13 DIAGNOSIS — G5601 Carpal tunnel syndrome, right upper limb: Secondary | ICD-10-CM

## 2017-11-13 DIAGNOSIS — G5602 Carpal tunnel syndrome, left upper limb: Secondary | ICD-10-CM

## 2017-11-13 DIAGNOSIS — Z6841 Body Mass Index (BMI) 40.0 and over, adult: Secondary | ICD-10-CM

## 2017-11-13 MED ORDER — LIDOCAINE HCL 1 % IJ SOLN
1.0000 mL | INTRAMUSCULAR | Status: AC | PRN
  Administered 2017-11-13: 1 mL

## 2017-11-13 MED ORDER — BUPIVACAINE HCL 0.25 % IJ SOLN
0.3300 mL | INTRAMUSCULAR | Status: AC | PRN
  Administered 2017-11-13: .33 mL

## 2017-11-13 MED ORDER — METHYLPREDNISOLONE ACETATE 40 MG/ML IJ SUSP
40.0000 mg | INTRAMUSCULAR | Status: AC | PRN
  Administered 2017-11-13: 40 mg

## 2017-11-13 NOTE — Progress Notes (Signed)
Office Visit Note   Patient: Claudia Wall           Date of Birth: 11-09-1975           MRN: [id number] Visit Date: 11/13/2017              Requested by: [place] Bing, DO [address] [place], [place] [address] PCP: [place] Bing, DO   Assessment & Plan: Visit Diagnoses:  1. Carpal tunnel syndrome, left upper limb   2. Carpal tunnel syndrome, right upper limb   3. Morbid obesity ([place])   4. Body mass index 40.0-44.9, adult (HCC)     Plan: Impression is bilateral carpal tunnel syndrome moderate compression left greater than right.  Today, we did discuss surgical intervention to include carpal tunnel release.  The patient would like to put this off for now and proceed with bilateral cortisone injections instead.  We will proceed with those today.  She will follow-up with [name] as needed.  Call with concerns or questions.  The patient meets the AMA guidelines for Morbid (severe) obesity with a BMI > 40.0 and I have recommended weight loss.   Follow-Up Instructions: Return if symptoms worsen or fail to improve.   Orders:  Orders Placed This Encounter  Procedures  . Hand/UE Inj: bilateral carpal tunnel   No orders of the defined types were placed in this encounter.     Procedures: Hand/UE Inj: bilateral carpal tunnel for carpal tunnel syndrome on 11/13/2017 9:41 AM Indications: pain Details: 25 G needle, volar approach Medications (Right): 1 mL lidocaine 1 %; 40 mg methylPREDNISolone acetate 40 MG/ML; 0.33 mL bupivacaine 0.25 % Medications (Left): 40 mg methylPREDNISolone acetate 40 MG/ML; 1 mL lidocaine 1 %; 0.33 mL bupivacaine 0.25 %      Clinical Data: No additional findings.   Subjective: Chief Complaint  Patient presents with  . Right Wrist - Pain  . Left Wrist - Pain    HPI patient is a pleasant 42 year old female who presents to our clinic today with recurrent bilateral wrist pain left greater than right.  History of carpal tunnel syndrome which is  been ongoing for about 2-1/2 years.  Previous nerve conduction study for a year and a half ago showed moderate compression median nerve left greater than right.  She had a previous cortisone injection to the left carpal tunnel back in April 2018 which helped for approximately 11 months.  No previous cortisone injection to the right.  She has continued pain to both hands with associated numbness and tingling which does wake her up at night.  She has tried and failed wrist splints.  She would like to proceed with carpal tunnel injections to both sides.  Review of Systems as detailed in HPI.  All others reviewed and are negative.   Objective: Vital Signs: LMP 10/23/2017 (Approximate)   Physical Exam well-developed well-nourished female no acute distress.  Alert and oriented x3.  Ortho Exam examination of both hands reveals positive Phalen and positive Tinel.  No thenar atrophy.  Specialty Comments:  No specialty comments available.  Imaging: No new imaging   PMFS History: Patient Active Problem List   Diagnosis Date Noted  . Morbid obesity ([place]) 11/13/2017  . Insomnia due to stress 11/09/2017  . Chronic toe pain, left foot 11/09/2017  . RLQ abdominal pain 07/13/2017  . Other hyperlipidemia 05/13/2017  . Vitamin D deficiency 05/13/2017  . Prediabetes 05/13/2017  . Other fatigue 04/29/2017  . Shortness of breath on  exertion 04/29/2017  . Enlarged thyroid 04/29/2017  . ETOHism ([place]) 04/29/2017  . PCOS (polycystic ovarian syndrome) 04/29/2017  . Irregular periods/menstrual cycles 02/12/2017  . Vaginal itching 02/12/2017  . Carpal tunnel syndrome, left upper limb 07/08/2016  . Lumbago with sciatica, left side 03/03/2016  . Carpal tunnel syndrome, right upper limb 07/07/2013  . Breast pain, left 05/12/2012  . Dyshidrotic eczema 11/06/2008  . Moderate cigarette smoker (10-19 per day) 11/01/2008  . Body mass index 40.0-44.9, adult ([place]) 02/03/2007  . Primary hypertension 05/28/2006    . GERD (gastroesophageal reflux disease) 05/28/2006   Past Medical History:  Diagnosis Date  . Alcohol abuse   . Anxiety   . Back pain   . Breast abscess   . Constipation   . Depression   . Gallbladder problem   . GERD (gastroesophageal reflux disease)   . HTN (hypertension)   . Hypertension   . Irregular periods/menstrual cycles   . Joint pain   . Lactose intolerance   . Leg edema   . Mastitis   . Obesity   . Obesity   . PCOS (polycystic ovarian syndrome)   . PTSD (post-traumatic stress disorder)   . Swallowing difficulty   . Tobacco abuse   . Tobacco abuse     Family History  Problem Relation Age of Onset  . Cancer Mother   . Hypertension Mother   . Heart disease Mother   . Depression Mother   . Bipolar disorder Mother   . Schizophrenia Mother   . Alcoholism Mother   . Drug abuse Mother   . Obesity Mother   . Hypertension Father   . Diabetes Father     Past Surgical History:  Procedure Laterality Date  . CHOLECYSTECTOMY  2002  . INCISION AND DRAINAGE BREAST ABSCESS     Social History   Occupational History  . Not on file  Tobacco Use  . Smoking status: Current Every Day Smoker    Packs/day: 0.25    Years: 25.00    Pack years: 6.25    Types: Cigarettes  . Smokeless tobacco: Never Used  . Tobacco comment: "used to smoke 1/2 to 1ppd depending on stress"  Substance and Sexual Activity  . Alcohol use: Yes    Alcohol/week: 3.0 standard drinks    Types: 3 Glasses of wine per week    Comment: occassional  . Drug use: No  . Sexual activity: Yes    Birth control/protection: Other-see comments, None    Comment: With women

## 2017-11-17 ENCOUNTER — Ambulatory Visit (INDEPENDENT_AMBULATORY_CARE_PROVIDER_SITE_OTHER): Payer: Self-pay | Admitting: Physician Assistant

## 2017-11-17 ENCOUNTER — Encounter (INDEPENDENT_AMBULATORY_CARE_PROVIDER_SITE_OTHER): Payer: Self-pay

## 2017-11-23 ENCOUNTER — Encounter: Payer: Self-pay | Admitting: Podiatry

## 2017-11-23 ENCOUNTER — Ambulatory Visit (INDEPENDENT_AMBULATORY_CARE_PROVIDER_SITE_OTHER): Payer: 59 | Admitting: Podiatry

## 2017-11-23 ENCOUNTER — Ambulatory Visit: Payer: 59 | Admitting: Podiatry

## 2017-11-23 VITALS — BP 133/80 | HR 73 | Resp 16

## 2017-11-23 DIAGNOSIS — Z79899 Other long term (current) drug therapy: Secondary | ICD-10-CM

## 2017-11-23 DIAGNOSIS — L600 Ingrowing nail: Secondary | ICD-10-CM

## 2017-11-23 NOTE — Patient Instructions (Signed)

## 2017-11-24 ENCOUNTER — Telehealth (INDEPENDENT_AMBULATORY_CARE_PROVIDER_SITE_OTHER): Payer: Self-pay

## 2017-11-24 NOTE — Telephone Encounter (Signed)
Please schedule this patient for an appt.Try to get in her in this week if possible so we can refill her meds at that time. Oliveah Zwack, [place]

## 2017-11-24 NOTE — Telephone Encounter (Signed)
Pt needs refill of Metformin and Vit D

## 2017-11-25 NOTE — Progress Notes (Signed)
   Subjective: Patient presents today for evaluation of intermittent pain to the medial border of the left great toe that began a few months ago. Patient is concerned for possible ingrown nail. She also reports thickening and discoloration of the toenails of bilateral feet. Applying pressure to the left hallux increases the pain. She has tried trimming the nail for treatment. Patient presents today for further treatment and evaluation.  Past Medical History:  Diagnosis Date  . Alcohol abuse   . Anxiety   . Back pain   . Breast abscess   . Constipation   . Depression   . Gallbladder problem   . GERD (gastroesophageal reflux disease)   . HTN (hypertension)   . Hypertension   . Irregular periods/menstrual cycles   . Joint pain   . Lactose intolerance   . Leg edema   . Mastitis   . Obesity   . Obesity   . PCOS (polycystic ovarian syndrome)   . PTSD (post-traumatic stress disorder)   . Swallowing difficulty   . Tobacco abuse   . Tobacco abuse     Objective:  General: Well developed, nourished, in no acute distress, alert and oriented x3   Dermatology: Skin is warm, dry and supple bilateral. Medial border left hallux appears to be erythematous with evidence of an ingrowing nail. Pain on palpation noted to the border of the nail fold. The remaining nails appear unremarkable at this time. There are no open sores, lesions. Hyperkeratotic, discolored, thickened, onychodystrophy of nails noted bilaterally.   Vascular: Dorsalis Pedis artery and Posterior Tibial artery pedal pulses palpable. No lower extremity edema noted.   Neruologic: Grossly intact via light touch bilateral.  Musculoskeletal: Muscular strength within normal limits in all groups bilateral. Normal range of motion noted to all pedal and ankle joints.   Assesement: #1 Paronychia with ingrowing nail medial border left hallux  #2 Pain in toe #3 Incurvated nail #4 onychomycosis nails 1-5 bilateral   Plan of Care:  1.  Patient evaluated.  2. Discussed treatment alternatives and plan of care. Explained nail avulsion procedure and post procedure course to patient. 3. Patient opted for permanent partial nail avulsion.  4. Prior to procedure, local anesthesia infiltration utilized using 3 ml of a 50:50 mixture of 2% plain lidocaine and 0.5% plain marcaine in a normal hallux block fashion and a betadine prep performed.  5. Partial permanent nail avulsion with chemical matrixectomy performed using [id number] applications of phenol followed by alcohol flush.  6. Light dressing applied. 7. Orders for liver function test placed.  8. Return to clinic in 2 weeks.   Edrick Kins, DPM [facility]  Dr. Edrick Kins, [place]                                        [place], [place] [address]                Office [phone]  Fax [phone]

## 2017-12-03 ENCOUNTER — Telehealth: Payer: Self-pay

## 2017-12-03 ENCOUNTER — Other Ambulatory Visit: Payer: Self-pay

## 2017-12-03 ENCOUNTER — Encounter (HOSPITAL_COMMUNITY): Payer: Self-pay | Admitting: *Deleted

## 2017-12-03 ENCOUNTER — Ambulatory Visit (HOSPITAL_COMMUNITY)
Admission: EM | Admit: 2017-12-03 | Discharge: 2017-12-03 | Disposition: A | Discharge status: Home / Self Care | Payer: Worker's Compensation | Attending: Family Medicine | Admitting: Family Medicine

## 2017-12-03 DIAGNOSIS — S39012A Strain of muscle, fascia and tendon of lower back, initial encounter: Secondary | ICD-10-CM

## 2017-12-03 MED ORDER — CYCLOBENZAPRINE HCL 5 MG PO TABS: 5.0000 mg | ORAL_TABLET | Freq: Every day | ORAL | 0 refills | Status: DC

## 2017-12-03 MED ORDER — KETOROLAC TROMETHAMINE 60 MG/2ML IM SOLN
60.0000 mg | Freq: Once | INTRAMUSCULAR | Status: AC
  Administered 2017-12-03: 60 mg via INTRAMUSCULAR

## 2017-12-03 MED ORDER — NAPROXEN 500 MG PO TABS: 500.0000 mg | ORAL_TABLET | Freq: Two times a day (BID) | ORAL | 0 refills | Status: DC

## 2017-12-03 MED ORDER — KETOROLAC TROMETHAMINE 60 MG/2ML IM SOLN
INTRAMUSCULAR | Status: AC
  Filled 2017-12-03: qty 2

## 2017-12-03 NOTE — ED Triage Notes (Signed)
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

## 2017-12-03 NOTE — ED Provider Notes (Signed)
[name]    CSN: [id number] Arrival date & time: 12/03/17  1909     History   Chief Complaint Chief Complaint  Patient presents with  . Back Pain    HPI Claudia Wall is a 42 y.o. female.   [name] presents with complaints of low back pain which started yesterday. States two days ago while at work a patient had started to fall and she had caught her client's legs to lift into the vehicle. Did not have immediate pain but woke yesterday with pain. By last night had severe pain, worse on the right side. Applied ice, took aleve and applied a medicated patch and pain improved. Today pain improved. Pain 5/10. Worse with movements and certain positions. No numbness tingling or weakness. No urinary or stool incontinence. No urinary symptoms. Has not taken any medications today for pain. States had a similar injury years ago. Hx of anxiety, back pain, constipation, gerd, htn, pcos, ptsd.    ROS per HPI.      Past Medical History:  Diagnosis Date  . Alcohol abuse   . Anxiety   . Back pain   . Breast abscess   . Constipation   . Depression   . Gallbladder problem   . GERD (gastroesophageal reflux disease)   . HTN (hypertension)   . Hypertension   . Irregular periods/menstrual cycles   . Joint pain   . Lactose intolerance   . Leg edema   . Mastitis   . Obesity   . Obesity   . PCOS (polycystic ovarian syndrome)   . PTSD (post-traumatic stress disorder)   . Swallowing difficulty   . Tobacco abuse   . Tobacco abuse     Patient Active Problem List   Diagnosis Date Noted  . Morbid obesity ([place]) 11/13/2017  . Insomnia due to stress 11/09/2017  . Chronic toe pain, left foot 11/09/2017  . RLQ abdominal pain 07/13/2017  . Other hyperlipidemia 05/13/2017  . Vitamin D deficiency 05/13/2017  . Prediabetes 05/13/2017  . Other fatigue 04/29/2017  . Shortness of breath on exertion 04/29/2017  . Enlarged thyroid 04/29/2017  . ETOHism ([place]) 04/29/2017  . PCOS  (polycystic ovarian syndrome) 04/29/2017  . Irregular periods/menstrual cycles 02/12/2017  . Vaginal itching 02/12/2017  . Carpal tunnel syndrome, left upper limb 07/08/2016  . Lumbago with sciatica, left side 03/03/2016  . Carpal tunnel syndrome, right upper limb 07/07/2013  . Breast pain, left 05/12/2012  . Dyshidrotic eczema 11/06/2008  . Moderate cigarette smoker (10-19 per day) 11/01/2008  . Body mass index 40.0-44.9, adult ([place]) 02/03/2007  . Primary hypertension 05/28/2006  . GERD (gastroesophageal reflux disease) 05/28/2006    Past Surgical History:  Procedure Laterality Date  . CHOLECYSTECTOMY  2002  . INCISION AND DRAINAGE BREAST ABSCESS      OB History    Gravida  2   Para      Term      Preterm      AB      Living  2     SAB      TAB      Ectopic      Multiple      Live Births               Home Medications    Prior to Admission medications   Medication Sig Start Date End Date Taking? Authorizing Provider  cyclobenzaprine (FLEXERIL) 5 MG tablet Take 1 tablet (5 mg total) by mouth at bedtime.  12/03/17   Zigmund Gottron, NP  diphenhydrAMINE (BENADRYL) 25 mg capsule Take 25 mg by mouth at bedtime as needed.     [provider]  FLUoxetine (PROZAC) 40 MG capsule Take 1 capsule (40 mg total) by mouth daily. 05/30/17 05/30/18  Aundra Dubin, MD  hydrochlorothiazide (HYDRODIURIL) 25 MG tablet Take 1 tablet (25 mg total) by mouth daily. 11/25/16   [place] Bing, DO  metFORMIN (GLUCOPHAGE) 500 MG tablet Take 1 tablet (500 mg total) by mouth daily with breakfast. 07/21/17   Lacy Duverney M, PA-C  naproxen (NAPROSYN) 500 MG tablet Take 1 tablet (500 mg total) by mouth 2 (two) times daily. 12/03/17   Zigmund Gottron, NP  traZODone (DESYREL) 100 MG tablet Take 50-100 mg nightly for sleep 05/30/17   Aundra Dubin, MD  Vitamin D, Ergocalciferol, (DRISDOL) 50000 units CAPS capsule Take 1 capsule (50,000 Units total) by mouth every 7 (seven)  days. 10/19/17   Waldon Merl, PA-C    Family History Family History  Problem Relation Age of Onset  . Cancer Mother   . Hypertension Mother   . Heart disease Mother   . Depression Mother   . Bipolar disorder Mother   . Schizophrenia Mother   . Alcoholism Mother   . Drug abuse Mother   . Obesity Mother   . Hypertension Father   . Diabetes Father     Social History Social History   Tobacco Use  . Smoking status: Current Every Day Smoker    Packs/day: 0.25    Years: 25.00    Pack years: 6.25    Types: Cigarettes  . Smokeless tobacco: Never Used  . Tobacco comment: "used to smoke 1/2 to 1ppd depending on stress"  Substance Use Topics  . Alcohol use: Yes    Alcohol/week: 3.0 standard drinks    Types: 3 Glasses of wine per week    Comment: occassional  . Drug use: No     Allergies   Hydrocodone and Tylenol [acetaminophen]   Review of Systems Review of Systems   Physical Exam Triage Vital Signs ED Triage Vitals  Enc Vitals Group     BP 12/03/17 1933 130/88     Pulse Rate 12/03/17 1933 68     Resp 12/03/17 1933 18     Temp 12/03/17 1933 98.1 F (36.7 C)     Temp Source 12/03/17 1933 Oral     SpO2 12/03/17 1933 100 %     Weight --      Height --      Head Circumference --      Peak Flow --      Pain Score 12/03/17 1936 5     Pain Loc --      Pain Edu? --      Excl. in [place]? --    No data found.  Updated Vital Signs BP 130/88 (BP Location: Right Arm)   Pulse 68   Temp 98.1 F (36.7 C) (Oral)   Resp 18   SpO2 100%   Visual Acuity Right Eye Distance:   Left Eye Distance:   Bilateral Distance:    Right Eye Near:   Left Eye Near:    Bilateral Near:     Physical Exam  Constitutional: She is oriented to person, place, and time. She appears well-developed and well-nourished. No distress.  Cardiovascular: Normal rate, regular rhythm and normal heart sounds.  Pulmonary/Chest: Effort normal and breath sounds normal.  Musculoskeletal:  Lumbar  back: She exhibits tenderness, bony tenderness, pain and spasm. She exhibits normal range of motion, no swelling, no edema, no deformity, no laceration and normal pulse.       Back:  Right low back pain with tenderness; pain with right straight leg raise; pain with transition from sit to lay and lay to sit and with laying with legs straight; strength equal bilaterally; gross sensation intact; ambulatory without difficulty  Neurological: She is alert and oriented to person, place, and time.  Skin: Skin is warm and dry.     UC Treatments / Results  Labs (all labs ordered are listed, but only abnormal results are displayed) Labs Reviewed - No data to display  EKG None  Radiology No results found.  Procedures Procedures (including critical care time)  Medications Ordered in UC Medications  ketorolac (TORADOL) injection 60 mg (has no administration in time range)    Initial Impression / Assessment and Plan / UC Course  I have reviewed the triage vital signs and the nursing notes.  Pertinent labs & imaging results that were available during my care of the patient were reviewed by me and considered in my medical decision making (see chart for details).     History and physical consistent with low back strain. Nsaids, muscle relaxers provided. Drowsy precautions provided. Exercises provided. toradol provided in clinic. Encouraged follow up with pcp as needed. Patient verbalized understanding and agreeable to plan.  Ambulatory out of clinic without difficulty.   Final Clinical Impressions(s) / UC Diagnoses   Final diagnoses:  Strain of lumbar region, initial encounter     Discharge Instructions     Light and regular activity as tolerated.  Naproxen twice a day, first dose tomorrow, take with food.  Muscle relaxer at night for spasms. May cause drowsiness. Please do not take if driving or drinking alcohol.   May use patch, rubs, heat application and/or ice as needed for pain.    See exercises provided.  Avoid heavy lifting until pain improves.  Follow up with your primary care provider for persistent symptoms.    ED Prescriptions    Medication Sig Dispense Auth. Provider   naproxen (NAPROSYN) 500 MG tablet Take 1 tablet (500 mg total) by mouth 2 (two) times daily. 30 tablet Augusto Gamble B, NP   cyclobenzaprine (FLEXERIL) 5 MG tablet Take 1 tablet (5 mg total) by mouth at bedtime. 15 tablet Zigmund Gottron, NP     Controlled Substance Prescriptions [place] Controlled Substance Registry consulted? Not Applicable   Zigmund Gottron, NP 12/03/17 2035

## 2017-12-03 NOTE — Telephone Encounter (Signed)
Pt called back, states she hurt her back Tuesday at work trying to catch someone who was falling. States OTC meds aren't helping and having pain with walking. Advised patient she would need to be seen and evaluated, patient states she can't come in until after 7pm. Advised patient to go to Urgent Care for evaluation, patient may be wanting to file workers comp claim and since it has already been 2 days was advised to be seen sooner rather than later Wallace Cullens, RN

## 2017-12-03 NOTE — Telephone Encounter (Signed)
Pt left message on nurse line requesting call back from nurse regarding "an incident that happened Tuesday." Attempted to call patient back, no answer and mailbox full. Will wait for patients call back  Wallace Cullens, RN

## 2017-12-03 NOTE — Discharge Instructions (Signed)
Light and regular activity as tolerated.  Naproxen twice a day, first dose tomorrow, take with food.  Muscle relaxer at night for spasms. May cause drowsiness. Please do not take if driving or drinking alcohol.   May use patch, rubs, heat application and/or ice as needed for pain.  See exercises provided.  Avoid heavy lifting until pain improves.  Follow up with your primary care provider for persistent symptoms.

## 2017-12-07 ENCOUNTER — Ambulatory Visit: Payer: 59

## 2017-12-08 NOTE — Telephone Encounter (Signed)
Can one of you ladies try to get this patient scheduled if she is still wanting to get refills on her medications please. Thank you

## 2017-12-09 ENCOUNTER — Encounter: Payer: 59 | Admitting: Podiatry

## 2017-12-09 ENCOUNTER — Encounter (INDEPENDENT_AMBULATORY_CARE_PROVIDER_SITE_OTHER): Payer: Self-pay

## 2017-12-09 NOTE — Telephone Encounter (Signed)
I called the patient, voicemail is full.  I have sent her a Pharmacist, community message.

## 2017-12-10 ENCOUNTER — Encounter (HOSPITAL_COMMUNITY): Payer: Self-pay | Admitting: Psychiatry

## 2017-12-14 ENCOUNTER — Ambulatory Visit: Payer: 59 | Admitting: Podiatry

## 2017-12-14 ENCOUNTER — Other Ambulatory Visit: Payer: Self-pay | Admitting: Family Medicine

## 2017-12-15 ENCOUNTER — Ambulatory Visit (HOSPITAL_COMMUNITY): Payer: Self-pay | Admitting: Psychiatry

## 2017-12-17 ENCOUNTER — Ambulatory Visit (HOSPITAL_COMMUNITY): Payer: 59 | Admitting: Psychiatry

## 2017-12-17 DIAGNOSIS — F4312 Post-traumatic stress disorder, chronic: Secondary | ICD-10-CM | POA: Diagnosis not present

## 2017-12-17 NOTE — Progress Notes (Signed)
   THERAPIST PROGRESS NOTE   Time: 3:05-4:00  Participation Level:Active  Behavioral Response:CasualAlertEuthymic  Type of Therapy:Individual Therapy  Treatment Goals addressed:Coping  Interventions:CBT  Summary:Claudia Wall a 41y.o.femalewho presents with Mood Disorder.   Suicidal/Homicidal:Nowithout intent/plan  Therapist Response:Pt. Presents for her first session since May 2019. Pt. Continues to presentPresented with significantly brightened mood compared to last session. Pt. Appeared well-rested and calm. Pt. Discussed that her self-care has been good and she feels "very content". Pt. Discussed that her children are doing well. Pt. Reports that she is doing well in her relationship, work is going well, and she is happy in her new apartment. Pt. Reports that she has minimal anxiety and is able to drive in the city and on the highway with no panic symptoms. Pt. Discussed plans to have hysterectomy, but feels hopeful about the surgery. Pt. Is looking forward to traveling to [place] in November discussed anticipating some anxiety regarding flying but planning to get sedative from her primary care to help her to manage her anxiety. Counselor discussed transition plan due to last day in the office. Pt. Was given referrals to [facility] and [facility] for ongoing therapy.  Diagnosis:PTSD; MDD; anxiety state  Plan:Pt. Given referrals to [facility] and [facility] for ongoing therapy.     Claudia Wall, [facility] 12/17/2017

## 2017-12-20 NOTE — Progress Notes (Signed)
This encounter was created in error - please disregard.

## 2017-12-22 ENCOUNTER — Ambulatory Visit: Payer: Self-pay | Admitting: Family Medicine

## 2017-12-22 NOTE — Progress Notes (Deleted)
   Subjective   Patient ID: Claudia Wall    DOB: January 03, 1976, 42 y.o. female   MRN: [id number]  CC: "***"  HPI: Claudia Wall is a 42 y.o. female who presents to clinic today for the following:  ***: ***  ***Last seen in August by me for insomnia secondary to stress.  Referred for nocturnal polysomnogram to rule out OSA.  Instructed patient to follow-up with [facility].  Given refill for Robaxin and encouraged to continue weight loss for left-sided sciatica secondary to chronic back pain.  Given referral to podiatry for toe pain and received partial nail avulsion.  ROS: see HPI for pertinent.  [place]: Obesity, HTN, GERD, eczema, anxiety, tobacco use disorder.Surgical cholecystectomy, I&D of breast abscess.Family mother cancer, HTN, father HTN.Smoking status reviewed. Medications reviewed.  Objective   There were no vitals taken for this visit. Vitals and nursing note reviewed.  General: well nourished, well developed, NAD with non-toxic appearance HEENT: normocephalic, atraumatic, moist mucous membranes Neck: supple, non-tender without lymphadenopathy Cardiovascular: regular rate and rhythm without murmurs, rubs, or gallops Lungs: clear to auscultation bilaterally with normal work of breathing Abdomen: soft, non-tender, non-distended, normoactive bowel sounds Skin: warm, dry, no rashes or lesions, cap refill < 2 seconds Extremities: warm and well perfused, normal tone, no edema  Assessment & Plan   No problem-specific Assessment & Plan notes found for this encounter.  No orders of the defined types were placed in this encounter.  No orders of the defined types were placed in this encounter.   Harriet Butte, [place], PGY-3 12/22/2017, 10:04 AM

## 2017-12-29 ENCOUNTER — Ambulatory Visit (INDEPENDENT_AMBULATORY_CARE_PROVIDER_SITE_OTHER): Payer: 59 | Admitting: Family Medicine

## 2017-12-29 ENCOUNTER — Encounter: Payer: Self-pay | Admitting: Family Medicine

## 2017-12-29 DIAGNOSIS — S39012A Strain of muscle, fascia and tendon of lower back, initial encounter: Secondary | ICD-10-CM | POA: Insufficient documentation

## 2017-12-29 DIAGNOSIS — F4312 Post-traumatic stress disorder, chronic: Secondary | ICD-10-CM

## 2017-12-29 DIAGNOSIS — Z6841 Body Mass Index (BMI) 40.0 and over, adult: Secondary | ICD-10-CM

## 2017-12-29 DIAGNOSIS — F4310 Post-traumatic stress disorder, unspecified: Secondary | ICD-10-CM | POA: Insufficient documentation

## 2017-12-29 MED ORDER — FLUOXETINE HCL 40 MG PO CAPS: 40.0000 mg | ORAL_CAPSULE | Freq: Every day | ORAL | 3 refills | Status: DC

## 2017-12-29 MED ORDER — METHOCARBAMOL 500 MG PO TABS: 500.0000 mg | ORAL_TABLET | Freq: Every day | ORAL | 0 refills | Status: DC

## 2017-12-29 MED ORDER — LORAZEPAM 0.5 MG PO TABS: ORAL_TABLET | ORAL | 0 refills | Status: DC

## 2017-12-29 MED ORDER — MELOXICAM 15 MG PO TABS
15.0000 mg | ORAL_TABLET | Freq: Every day | ORAL | 0 refills | Status: DC
Start: 2017-12-29 — End: 2018-07-16

## 2017-12-29 NOTE — Assessment & Plan Note (Signed)
Chronic.  Well-controlled with use of Prozac.  Lost to follow-up due to provider leaving area.  Previously saw therapy but no longer interested.  Does have upcoming flight and nervous about anxiety attack. - Given refill for Prozac 40 mg daily - Given 5 tablets of 0.5 mg lorazepam with instructions to use 1 hour before flight

## 2017-12-29 NOTE — Assessment & Plan Note (Signed)
Chronic.  Patient making efforts including diet and exercise.  Multiple comorbidities including prediabetes. - Congratulated patient on efforts and encouraged her to continue

## 2017-12-29 NOTE — Assessment & Plan Note (Signed)
Acute on chronic back pain.  Seems to be musculoskeletal in nature likely related to mechanical injury.  No red flags. - Given refill for Robaxin and Mobic for pain control with plans to return in 1 month if symptoms do not improve - Reviewed return precautions

## 2017-12-29 NOTE — Patient Instructions (Signed)
Thank you for coming in to see [name] today. Please see below to review our plan for today's visit.  1.  I have refilled your Prozac for your PTSD.  You now have a 1 year supply. 2.  For your flight, I gave you 5 tablets of Ativan.  Take 1 tablet 1 hour prior to getting on the airplane.  This medication typically last 6-8 hours.  You can take an additional if needed since you are going on a long trip.  Do not operate heavy machinery or drive while on this medication.  I have given you enough that should last you for the flight there and back. 3.  If you continue having back pain in 1 month, let me know.  I am pleased to hear that you are making efforts to lose weight.  In the meantime, you can take Robaxin 1 hour before bedtime.  Again do not operate heavy machinery or drive while on this medication.  You also have Mobic which she will take once daily.  Do not take naproxen or ibuprofen while on this medication. 4.  Go get your flu shot.  Please call the clinic at [phone] if your symptoms worsen or you have any concerns. It was our pleasure to serve you.  Harriet Butte, [place], PGY-3

## 2017-12-29 NOTE — Progress Notes (Signed)
Subjective   Patient ID: Claudia Wall    DOB: Feb 25, 1976, 41 y.o. female   MRN: [id number]  CC: "Need nerve medication"  HPI: Claudia Wall is a 43 y.o. female who presents to clinic today for the following:  PTSD: Patient has a long history of PTSD managed by Dr. Luane School on [place] with use of Prozac 40 mg daily.  She was also seeing a therapist at the time but her providers left recently and she currently is not seeing psychiatry.  She continues to take her Prozac though she misplaced it for approximately 2 weeks but has restarted the medication.  Her episodes are well controlled with the medication and occur once every other month with episodes of diaphoresis, palpitations, tremor, and sensation of impending doom.  These occur less frequently with the Prozac.  Patient is nervous about flying to [place] for vacation in November and is requesting medication since she has never flown before.  Back pain: Patient has history of long-standing back pain with sciatica primarily on left side.  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  She continues having pain which has improved some.  She denies motor weakness, sensory loss, loss of bowel or bladder incontinence.  Obesity: Patient is making efforts to exercise by walking in the pool and using the elliptical.  She has recently made efforts to quit smoking.  She has made dietary modifications by increasing her vegetable intake.  She has made these efforts since being told she has prediabetes.  ROS: see HPI for pertinent.  [place]: Obesity, HTN, GERD, eczema, anxiety, tobacco use disorder.Surgical cholecystectomy, I&D of breast abscess.Family mother cancer, HTN, father HTN.Smoking status reviewed. Medications reviewed.  Objective   There were no vitals taken for this visit. Vitals  and nursing note reviewed.  General: well nourished, well developed, NAD with non-toxic appearance HEENT: normocephalic, atraumatic, moist mucous membranes Cardiovascular: regular rate and rhythm without murmurs, rubs, or gallops Lungs: clear to auscultation bilaterally with normal work of breathing Abdomen: obese, soft, non-tender, non-distended, normoactive bowel sounds Skin: warm, dry, no rashes or lesions, cap refill < 2 seconds Extremities: warm and well perfused, normal tone, no edema Neuro: grossly intact throughout, non-tremulous Psych: euthymic mood, congruent affect  Assessment & Plan   Morbid obesity with BMI of 40.0-44.9, adult (HCC) Chronic.  Patient making efforts including diet and exercise.  Multiple comorbidities including prediabetes. - Congratulated patient on efforts and encouraged her to continue  Acute lumbar myofascial strain Acute on chronic back pain.  Seems to be musculoskeletal in nature likely related to mechanical injury.  No red flags. - Given refill for Robaxin and Mobic for pain control with plans to return in 1 month if symptoms do not improve - Reviewed return precautions  Chronic post-traumatic stress disorder (PTSD) Chronic.  Well-controlled with use of Prozac.  Lost to follow-up due to provider leaving area.  Previously saw therapy but no longer interested.  Does have upcoming flight and nervous about anxiety attack. - Given refill for Prozac 40 mg daily - Given 5 tablets of 0.5 mg lorazepam with instructions to use 1 hour before flight  No orders of the defined types were placed in this encounter.  Meds ordered this encounter  Medications  . methocarbamol (ROBAXIN) 500 MG tablet    Sig: Take 1 tablet (  500 mg total) by mouth at bedtime.    Dispense:  5 tablet    Refill:  0  . meloxicam (MOBIC) 15 MG tablet    Sig: Take 1 tablet (15 mg total) by mouth daily.    Dispense:  30 tablet    Refill:  0  . FLUoxetine (PROZAC) 40 MG capsule    Sig:  Take 1 capsule (40 mg total) by mouth daily.    Dispense:  90 capsule    Refill:  3  . LORazepam (ATIVAN) 0.5 MG tablet    Sig: TAKE 1 HOUR BEFORE FLYING AND DO NOT DRIVE OR OPERATE HEAVY MACHINERY.    Dispense:  5 tablet    Refill:  0    Harriet Butte, DO [place], PGY-3 12/29/2017, 5:01 PM

## 2017-12-30 ENCOUNTER — Ambulatory Visit: Payer: 59

## 2018-01-12 ENCOUNTER — Ambulatory Visit: Payer: 59

## 2018-01-20 ENCOUNTER — Ambulatory Visit (HOSPITAL_BASED_OUTPATIENT_CLINIC_OR_DEPARTMENT_OTHER): Payer: 59 | Attending: Family Medicine | Admitting: Internal Medicine

## 2018-01-20 VITALS — Ht 63.0 in | Wt 249.0 lb

## 2018-01-20 DIAGNOSIS — R0683 Snoring: Secondary | ICD-10-CM | POA: Diagnosis not present

## 2018-01-20 DIAGNOSIS — F5102 Adjustment insomnia: Secondary | ICD-10-CM | POA: Diagnosis present

## 2018-01-27 DIAGNOSIS — F5102 Adjustment insomnia: Secondary | ICD-10-CM

## 2018-01-27 NOTE — Procedures (Signed)
    Patient Name: Claudia Wall, [place] Date: 01/20/2018 Gender: Female D.O.B: 1976/03/27 Age (years): 42 Referring Provider: Dorcas Mcmurray Height (inches): 45 Interpreting Physician: Baird Lyons MD, ABSM Weight (lbs): 249 RPSGT: Laren Everts BMI: 39 MRN: [id number] Neck Size: 15.00  CLINICAL INFORMATION Sleep Study Type: NPSG Indication for sleep study: Daytime Fatigue, Fatigue, Hypertension, Insomnia, Morbid Obesity, Morning Headaches, Non-refreshing Sleep, Parasomnias, Snoring, Witnesses Apnea / Gasping During Sleep  Epworth Sleepiness Score: 16  SLEEP STUDY TECHNIQUE As per the AASM Manual for the Scoring of Sleep and Associated Events v2.3 (April 2016) with a hypopnea requiring 4% desaturations.  The channels recorded and monitored were frontal, central and occipital EEG, electrooculogram (EOG), submentalis EMG (chin), nasal and oral airflow, thoracic and abdominal wall motion, anterior tibialis EMG, snore microphone, electrocardiogram, and pulse oximetry.  MEDICATIONS Medications self-administered by patient taken the night of the study : HCTZ, ADVIL, PROZAC, BENADRYL  SLEEP ARCHITECTURE The study was initiated at 11:17:40 PM and ended at 5:41:36 AM.  Sleep onset time was 23.5 minutes and the sleep efficiency was 84.4%%. The total sleep time was 324 minutes.  Stage REM latency was 191.5 minutes.  The patient spent 14.2%% of the night in stage N1 sleep, 71.5%% in stage N2 sleep, 0.0%% in stage N3 and 14.4% in REM.  Alpha intrusion was absent.  Supine sleep was 38.88%.  RESPIRATORY PARAMETERS The overall apnea/hypopnea index (AHI) was 3.7 per hour. There were 0 total apneas, including 0 obstructive, 0 central and 0 mixed apneas. There were 20 hypopneas and 23 RERAs.  The AHI during Stage REM sleep was 7.7 per hour.  AHI while supine was 7.6 per hour.  The mean oxygen saturation was 96.6%. The minimum SpO2 during sleep was 90.0%.  moderate snoring was noted  during this study.  CARDIAC DATA The 2 lead EKG demonstrated sinus rhythm. The mean heart rate was 73.8 beats per minute. Other EKG findings include: None.  LEG MOVEMENT DATA The total PLMS were 0 with a resulting PLMS index of 0.0. Associated arousal with leg movement index was 0.6 .  IMPRESSIONS - No significant obstructive sleep apnea occurred during this study (AHI = 3.7/h). - No significant central sleep apnea occurred during this study (CAI = 0.0/h). - The patient had minimal or no oxygen desaturation during the study (Min O2 = 90.0%) - The patient snored with moderate snoring volume. - No cardiac abnormalities were noted during this study. - Clinically significant periodic limb movements did not occur during sleep. No significant associated arousals.  DIAGNOSIS - Primary snoring  RECOMMENDATIONS - Be careful with alcohol, sedatives and other CNS depressants that may worsen sleep apnea and disrupt normal sleep architecture. - Sleep hygiene should be reviewed to assess factors that may improve sleep quality. - Weight management and regular exercise should be initiated or continued if appropriate.  [Electronically signed] 01/27/2018 02:57 PM  Baird Lyons MD, [place], American Board of Sleep Medicine   NPI: [id number]                         [place], [place] [facility]  ELECTRONICALLY SIGNED ON:  01/27/2018, 2:55 PM [place] PH: [phone]   FX: [phone] [place]

## 2018-02-09 ENCOUNTER — Encounter (INDEPENDENT_AMBULATORY_CARE_PROVIDER_SITE_OTHER): Payer: Self-pay | Admitting: Orthopaedic Surgery

## 2018-02-09 ENCOUNTER — Ambulatory Visit (INDEPENDENT_AMBULATORY_CARE_PROVIDER_SITE_OTHER): Payer: 59 | Admitting: Orthopaedic Surgery

## 2018-02-09 DIAGNOSIS — G5603 Carpal tunnel syndrome, bilateral upper limbs: Secondary | ICD-10-CM | POA: Insufficient documentation

## 2018-02-09 DIAGNOSIS — Z6841 Body Mass Index (BMI) 40.0 and over, adult: Secondary | ICD-10-CM

## 2018-02-09 MED ORDER — LIDOCAINE HCL 1 % IJ SOLN
1.0000 mL | INTRAMUSCULAR | Status: AC | PRN
  Administered 2018-02-09: 1 mL

## 2018-02-09 MED ORDER — BUPIVACAINE HCL 0.25 % IJ SOLN
0.3300 mL | INTRAMUSCULAR | Status: AC | PRN
  Administered 2018-02-09: .33 mL

## 2018-02-09 MED ORDER — METHYLPREDNISOLONE ACETATE 40 MG/ML IJ SUSP
40.0000 mg | INTRAMUSCULAR | Status: AC | PRN
  Administered 2018-02-09: 40 mg

## 2018-02-09 NOTE — Progress Notes (Signed)
Office Visit Note   Patient: Claudia Wall           Date of Birth: [birth year]           MRN: [id number] Visit Date: 02/09/2018              Requested by: [place] Bing, DO [address] [place], [place] [address] PCP: Wilton [place] Bing, DO   Assessment & Plan: Visit Diagnoses:  1. Bilateral carpal tunnel syndrome   2. Morbid obesity ([place])   3. Body mass index 40.0-44.9, adult (HCC)     Plan: Impression is bilateral carpal tunnel syndrome left greater than right.  Today repeat cortisone injections were performed.  We did discuss surgical intervention should the need arise.  She will follow-up with [name] as needed.  The patient meets the AMA guidelines for Morbid (severe) obesity with a BMI > 40.0 and I have recommended weight loss.   Follow-Up Instructions: Return if symptoms worsen or fail to improve.   Orders:  Orders Placed This Encounter  Procedures  . Hand/UE Inj: bilateral carpal tunnel   No orders of the defined types were placed in this encounter.     Procedures: Hand/UE Inj: bilateral carpal tunnel for carpal tunnel syndrome on 02/09/2018 9:34 AM Indications: pain Details: 25 G needle, volar approach Medications (Right): 1 mL lidocaine 1 %; 40 mg methylPREDNISolone acetate 40 MG/ML; 0.33 mL bupivacaine 0.25 % Medications (Left): 40 mg methylPREDNISolone acetate 40 MG/ML; 1 mL lidocaine 1 %; 0.33 mL bupivacaine 0.25 %      Clinical Data: No additional findings.   Subjective: Chief Complaint  Patient presents with  . Right Hand - Numbness, Pain  . Left Hand - Numbness, Pain    HPI patient is a pleasant 42 year old female who presents to our clinic today with recurrent symptoms bilateral carpal tunnel syndrome left greater than right.  She has had these symptoms for the past several months.  History of moderate median nerve compression bilateral upper extremities left greater than right.  First injections were in April 2019 with great relief of  symptoms lasting for several months.  Repeat injections in August 2019.  She had increased pain for the first week following the injections, but then had improvement of symptoms for a few weeks following.  Her symptoms have returned and have started to worsen.  She would like repeat injections today.  Review of Systems as detailed in HPI.  All others reviewed and are negative   Objective: Vital Signs: There were no vitals taken for this visit.  Physical Exam well-developed well-nourished female no acute distress.  Alert and oriented x3.  Ortho Exam stable exam both upper extremities.  Specialty Comments:  No specialty comments available.  Imaging: No new imaging   PMFS History: Patient Active Problem List   Diagnosis Date Noted  . Bilateral carpal tunnel syndrome 02/09/2018  . Acute lumbar myofascial strain 12/29/2017  . Chronic post-traumatic stress disorder (PTSD) 12/29/2017  . Morbid obesity ([place]) 11/13/2017  . Insomnia due to stress 11/09/2017  . Chronic toe pain, left foot 11/09/2017  . RLQ abdominal pain 07/13/2017  . Other hyperlipidemia 05/13/2017  . Vitamin D deficiency 05/13/2017  . Prediabetes 05/13/2017  . Shortness of breath on exertion 04/29/2017  . Enlarged thyroid 04/29/2017  . ETOHism ([place]) 04/29/2017  . PCOS (polycystic ovarian syndrome) 04/29/2017  . Irregular periods/menstrual cycles 02/12/2017  . Carpal tunnel syndrome, left upper limb 07/08/2016  . Lumbago with sciatica, left side 03/03/2016  .  Carpal tunnel syndrome, right upper limb 07/07/2013  . Breast pain, left 05/12/2012  . Dyshidrotic eczema 11/06/2008  . Tobacco use disorder 11/01/2008  . Body mass index 40.0-44.9, adult ([place]) 02/03/2007  . Primary hypertension 05/28/2006  . GERD (gastroesophageal reflux disease) 05/28/2006   Past Medical History:  Diagnosis Date  . Alcohol abuse   . Anxiety   . Back pain   . Breast abscess   . Constipation   . Depression   . Gallbladder problem     . GERD (gastroesophageal reflux disease)   . HTN (hypertension)   . Hypertension   . Irregular periods/menstrual cycles   . Joint pain   . Lactose intolerance   . Leg edema   . Mastitis   . Obesity   . Obesity   . PCOS (polycystic ovarian syndrome)   . PTSD (post-traumatic stress disorder)   . Swallowing difficulty   . Tobacco abuse   . Tobacco abuse     Family History  Problem Relation Age of Onset  . Cancer Mother   . Hypertension Mother   . Heart disease Mother   . Depression Mother   . Bipolar disorder Mother   . Schizophrenia Mother   . Alcoholism Mother   . Drug abuse Mother   . Obesity Mother   . Hypertension Father   . Diabetes Father     Past Surgical History:  Procedure Laterality Date  . CHOLECYSTECTOMY  2002  . INCISION AND DRAINAGE BREAST ABSCESS     Social History   Occupational History  . Not on file  Tobacco Use  . Smoking status: Current Every Day Smoker    Packs/day: 0.25    Years: 25.00    Pack years: 6.25    Types: Cigarettes  . Smokeless tobacco: Never Used  . Tobacco comment: "used to smoke 1/2 to 1ppd depending on stress"  Substance and Sexual Activity  . Alcohol use: Yes    Alcohol/week: 3.0 standard drinks    Types: 3 Glasses of wine per week    Comment: occassional  . Drug use: No  . Sexual activity: Yes    Birth control/protection: Other-see comments, None    Comment: With women

## 2018-02-10 ENCOUNTER — Ambulatory Visit: Payer: 59

## 2018-02-15 ENCOUNTER — Telehealth (INDEPENDENT_AMBULATORY_CARE_PROVIDER_SITE_OTHER): Payer: Self-pay | Admitting: Orthopaedic Surgery

## 2018-02-15 NOTE — Telephone Encounter (Signed)
Patient called stating that she is still having numbness in her hand and fingers and has tried ice, elevation and wearing her braces and nothing is working.  CB#[phone].  Thank you

## 2018-02-16 NOTE — Telephone Encounter (Signed)
Called patient to let her know

## 2018-02-16 NOTE — Telephone Encounter (Signed)
See message below °

## 2018-02-16 NOTE — Telephone Encounter (Signed)
We need to give it a little more time.  It's only been 1 week since the injection

## 2018-03-12 ENCOUNTER — Encounter: Payer: Self-pay | Admitting: Family Medicine

## 2018-03-12 ENCOUNTER — Telehealth: Payer: Self-pay | Admitting: Family Medicine

## 2018-03-12 NOTE — Telephone Encounter (Signed)
Pt is calling and would like for the doctor to write a letter so that she can return to work. Please call her with questions and when ready to pick up jw

## 2018-03-12 NOTE — Telephone Encounter (Signed)
Letter made. Ready for print. Please advise.  Harriet Butte, [place], PGY-3

## 2018-03-15 NOTE — Telephone Encounter (Signed)
Called patient and informed that letter has been printed and left up front for pickup.  Claudia Wall, [place]

## 2018-04-05 ENCOUNTER — Telehealth: Payer: Self-pay

## 2018-04-05 ENCOUNTER — Encounter: Payer: Self-pay | Admitting: Family Medicine

## 2018-04-05 NOTE — Telephone Encounter (Signed)
Corrected to 04/12/2018. Please advise.  Harriet Butte, [place], PGY-3

## 2018-04-05 NOTE — Telephone Encounter (Signed)
Pt is calling concerning the letter she picked up for work. She said the dates were wrong. It showed that she should return in December not January. Pt would like to know if Dr. Yisroel Ramming could rewrite the letter witting her back to work in January.   Pt would like for someone to call her when it is ready to be picked up at the front desk.

## 2018-04-05 NOTE — Telephone Encounter (Signed)
Placed new letter up front for patient excusing her from work and called to inform her that it is ready for pick up.  Claudia Wall, [place]

## 2018-04-14 ENCOUNTER — Encounter: Payer: Self-pay | Admitting: Family Medicine

## 2018-04-14 ENCOUNTER — Ambulatory Visit (INDEPENDENT_AMBULATORY_CARE_PROVIDER_SITE_OTHER): Payer: 59 | Admitting: Family Medicine

## 2018-04-14 DIAGNOSIS — R51 Headache: Secondary | ICD-10-CM | POA: Diagnosis not present

## 2018-04-14 DIAGNOSIS — R519 Headache, unspecified: Secondary | ICD-10-CM | POA: Insufficient documentation

## 2018-04-14 NOTE — Progress Notes (Signed)
Subjective  Claudia Wall is a 43 y.o. female is presenting with the following  HEADACHE  Headache started unsure just seem to be happening more often Pain is bilateral throbbing  Keeps from doing:  Can still function just feels headache around her head Medications tried: take aleve or ibuprofen or mobic daily for various pains.  This does help her headache when she takes them Patient thinks cause of headache might be: stress?  Head trauma: no Sudden onset: no Previous similar headaches: on and off Taking blood thinners: no History of cancer: no  Symptoms Nose congestion stuffiness:  no Nausea vomiting: no Photophobia: no Noise sensitivity: no Double vision or loss of vision: no Fever: no Neck Stiffness: no Trouble walking or speaking: no  Had recent Sleep Study - does not know result Wears glasses - has not had eyes checked for quite a while   Review of Symptoms - see HPI PMH - Smoking status noted.      Chief Complaint noted Review of Symptoms - see HPI PMH - Smoking status noted.    Objective Vital Signs reviewed BP 138/80   Pulse 89   Temp 98.9 F (37.2 C) (Oral)   Ht 5\' 3"  (1.6 m)   Wt 254 lb 3.2 oz (115.3 kg)   SpO2 97%   BMI 45.03 kg/m  Neurologic exam : Cn 2-7 intact Strength equal & normal in upper & lower extremities Able to walk on heels and toes.   Balance normal  Romberg normal, finger to nose normal Eye - Pupils Equal Round Reactive to light, Extraocular movements intact, Fundi not well seen but without hemorrhage or visible lesions, Conjunctiva without redness or discharge  Assessments/Plans   Headache Many possibilities.  Sounds to be tension type but could be related to untreated OSA or Medication Overuse.  No signs of focal CNS disease.  See after visit summary for plans

## 2018-04-14 NOTE — Patient Instructions (Signed)
Good to see you today!  Thanks for coming in.  For the Headache  - Follow up on your sleep apnea test  - See you eye doctor   - Try to avoid regular NSAIDs every day - this could be Medication Overuse headache   - If you have severe headache with weakness or visual changes that is an emergency  For Pain  - consider trying different forms of tylenol   - consider ways to decrease your menstrual periods pain   Consider metformin every day   Do not take ibuprofen or aleve or meloxicam (mobic) together

## 2018-04-14 NOTE — Assessment & Plan Note (Signed)
Many possibilities.  Sounds to be tension type but could be related to untreated OSA or Medication Overuse.  No signs of focal CNS disease.  See after visit summary for plans

## 2018-04-28 ENCOUNTER — Other Ambulatory Visit: Payer: Self-pay

## 2018-04-28 DIAGNOSIS — R7303 Prediabetes: Secondary | ICD-10-CM

## 2018-04-28 DIAGNOSIS — E559 Vitamin D deficiency, unspecified: Secondary | ICD-10-CM

## 2018-04-29 MED ORDER — VITAMIN D (ERGOCALCIFEROL) 1.25 MG (50000 UNIT) PO CAPS: 50000.0000 [IU] | ORAL_CAPSULE | ORAL | 0 refills | Status: DC

## 2018-04-29 MED ORDER — METFORMIN HCL 500 MG PO TABS: 500.0000 mg | ORAL_TABLET | Freq: Every day | ORAL | 0 refills | Status: DC

## 2018-05-01 HISTORY — PX: OTHER SURGICAL HISTORY: SHX169

## 2018-05-06 ENCOUNTER — Encounter (INDEPENDENT_AMBULATORY_CARE_PROVIDER_SITE_OTHER): Payer: Self-pay | Admitting: Orthopaedic Surgery

## 2018-05-06 ENCOUNTER — Ambulatory Visit (INDEPENDENT_AMBULATORY_CARE_PROVIDER_SITE_OTHER): Payer: 59 | Admitting: Orthopaedic Surgery

## 2018-05-06 DIAGNOSIS — G5603 Carpal tunnel syndrome, bilateral upper limbs: Secondary | ICD-10-CM

## 2018-05-06 MED ORDER — GABAPENTIN 100 MG PO CAPS: 100.0000 mg | ORAL_CAPSULE | Freq: Three times a day (TID) | ORAL | 3 refills | Status: DC

## 2018-05-06 NOTE — Progress Notes (Signed)
Office Visit Note   Patient: Claudia Wall           Date of Birth: 07/04/1975           MRN: [id number] Visit Date: 05/06/2018              Requested by: [place] Bing, DO [address] [place], [place] [address] PCP: Galva Bing, DO   Assessment & Plan: Visit Diagnoses:  1. Bilateral carpal tunnel syndrome     Plan: Impression is moderate bilateral carpal tunnel syndrome worse on the left with some severe sensory components.  At this point I recommend surgical release in a staged fashion.  Patient has only had temporary relief from previous 4 injections.  We discussed a left carpal tunnel release and a right carpal tunnel injection in the near future.  Risks and benefits and rehab and recovery were discussed.  Patient would like to schedule in the near future.  I have prescribed her gabapentin to hopefully help with some of the pain. Total face to face encounter time was greater than 25 minutes and over half of this time was spent in counseling and/or coordination of care.  Follow-Up Instructions: Return for 2 week postop visit.   Orders:  No orders of the defined types were placed in this encounter.  Meds ordered this encounter  Medications  . gabapentin (NEURONTIN) 100 MG capsule    Sig: Take 1 capsule (100 mg total) by mouth 3 (three) times daily.    Dispense:  30 capsule    Refill:  3      Procedures: No procedures performed   Clinical Data: No additional findings.   Subjective: Chief Complaint  Patient presents with  . Right Hand - Pain  . Left Hand - Pain    Claudia Wall returns today for follow-up of bilateral carpal tunnel syndrome worse on the left.  Altogether she has had 4 carpal tunnel injections in each hand with about 3 months of relief from pain and numbness.  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  She has trouble sleeping.  Nighttime bracing helps only partially.   Review of Systems   Objective: Vital  Signs: There were no vitals taken for this visit.  Physical Exam  Ortho Exam Bilateral hand exams are stable. Specialty Comments:  No specialty comments available.  Imaging: No results found.   PMFS History: Patient Active Problem List   Diagnosis Date Noted  . Headache 04/14/2018  . Bilateral carpal tunnel syndrome 02/09/2018  . Acute lumbar myofascial strain 12/29/2017  . Chronic post-traumatic stress disorder (PTSD) 12/29/2017  . Morbid obesity ([place]) 11/13/2017  . Insomnia due to stress 11/09/2017  . Chronic toe pain, left foot 11/09/2017  . RLQ abdominal pain 07/13/2017  . Other hyperlipidemia 05/13/2017  . Vitamin D deficiency 05/13/2017  . Prediabetes 05/13/2017  . Shortness of breath on exertion 04/29/2017  . Enlarged thyroid 04/29/2017  . ETOHism ([place]) 04/29/2017  . PCOS (polycystic ovarian syndrome) 04/29/2017  . Irregular periods/menstrual cycles 02/12/2017  . Carpal tunnel syndrome, left upper limb 07/08/2016  . Lumbago with sciatica, left side 03/03/2016  . Carpal tunnel syndrome, right upper limb 07/07/2013  . Breast pain, left 05/12/2012  . Dyshidrotic eczema 11/06/2008  . Tobacco use disorder 11/01/2008  . Body mass index 40.0-44.9, adult ([place]) 02/03/2007  . Primary hypertension 05/28/2006  . GERD (gastroesophageal reflux disease) 05/28/2006   Past Medical History:  Diagnosis Date  .  Alcohol abuse   . Anxiety   . Back pain   . Breast abscess   . Constipation   . Depression   . Gallbladder problem   . GERD (gastroesophageal reflux disease)   . HTN (hypertension)   . Hypertension   . Irregular periods/menstrual cycles   . Joint pain   . Lactose intolerance   . Leg edema   . Mastitis   . Obesity   . Obesity   . PCOS (polycystic ovarian syndrome)   . PTSD (post-traumatic stress disorder)   . Swallowing difficulty   . Tobacco abuse   . Tobacco abuse     Family History  Problem Relation Age of Onset  . Cancer Mother   . Hypertension  Mother   . Heart disease Mother   . Depression Mother   . Bipolar disorder Mother   . Schizophrenia Mother   . Alcoholism Mother   . Drug abuse Mother   . Obesity Mother   . Hypertension Father   . Diabetes Father     Past Surgical History:  Procedure Laterality Date  . CHOLECYSTECTOMY  2002  . INCISION AND DRAINAGE BREAST ABSCESS     Social History   Occupational History  . Not on file  Tobacco Use  . Smoking status: Current Every Day Smoker    Packs/day: 0.25    Years: 25.00    Pack years: 6.25    Types: Cigarettes  . Smokeless tobacco: Never Used  . Tobacco comment: "used to smoke 1/2 to 1ppd depending on stress"  Substance and Sexual Activity  . Alcohol use: Yes    Alcohol/week: 3.0 standard drinks    Types: 3 Glasses of wine per week    Comment: occassional  . Drug use: No  . Sexual activity: Yes    Birth control/protection: Other-see comments, None    Comment: With women

## 2018-05-07 ENCOUNTER — Other Ambulatory Visit: Payer: Self-pay | Admitting: Family Medicine

## 2018-05-07 DIAGNOSIS — Z1231 Encounter for screening mammogram for malignant neoplasm of breast: Secondary | ICD-10-CM

## 2018-05-13 ENCOUNTER — Telehealth (INDEPENDENT_AMBULATORY_CARE_PROVIDER_SITE_OTHER): Payer: Self-pay | Admitting: Orthopaedic Surgery

## 2018-05-13 NOTE — Telephone Encounter (Signed)
See message below °

## 2018-05-13 NOTE — Telephone Encounter (Signed)
2-4 weeks depending on what she has to do at work and if she can use one hand or not

## 2018-05-13 NOTE — Telephone Encounter (Signed)
Patient called requesting a note that states she is having surgery on a particular date and how long she is expected to be out of work.  CB#[phone].  Thank you.

## 2018-05-14 ENCOUNTER — Encounter (INDEPENDENT_AMBULATORY_CARE_PROVIDER_SITE_OTHER): Payer: Self-pay

## 2018-05-14 NOTE — Telephone Encounter (Signed)
Letter made and its ready for pick up. Tried calling patient no answer. LMOM.

## 2018-05-20 ENCOUNTER — Other Ambulatory Visit (INDEPENDENT_AMBULATORY_CARE_PROVIDER_SITE_OTHER): Payer: Self-pay | Admitting: Physician Assistant

## 2018-05-20 ENCOUNTER — Telehealth (INDEPENDENT_AMBULATORY_CARE_PROVIDER_SITE_OTHER): Payer: Self-pay

## 2018-05-20 DIAGNOSIS — G5602 Carpal tunnel syndrome, left upper limb: Secondary | ICD-10-CM | POA: Diagnosis not present

## 2018-05-20 DIAGNOSIS — G5601 Carpal tunnel syndrome, right upper limb: Secondary | ICD-10-CM

## 2018-05-20 MED ORDER — TRAMADOL HCL 50 MG PO TABS: ORAL_TABLET | ORAL | 1 refills | Status: DC

## 2018-05-20 NOTE — Telephone Encounter (Signed)
Patient called concerning taking Tramadol.  Advised patient per Rx that was given to take Tramadol 6-8 hours as needed for pain.

## 2018-06-01 ENCOUNTER — Ambulatory Visit: Payer: Self-pay

## 2018-06-02 ENCOUNTER — Telehealth (INDEPENDENT_AMBULATORY_CARE_PROVIDER_SITE_OTHER): Payer: Self-pay

## 2018-06-02 ENCOUNTER — Telehealth (INDEPENDENT_AMBULATORY_CARE_PROVIDER_SITE_OTHER): Payer: Self-pay | Admitting: Orthopaedic Surgery

## 2018-06-02 NOTE — Telephone Encounter (Signed)
Patient called stating that her bandage had gotten soak and wet from taking a shower.  Advised Dr. Erlinda Hong of message and he stated for patient to remove dressing and apply a 4 x4 gauze over incision until being seen. Advised patient of message above per Dr. Erlinda Hong.  Patient stated that she understands. Appt. 06/03/2018

## 2018-06-02 NOTE — Telephone Encounter (Signed)
Patient called got the entire wound soiled(WET) vERY CONCERNED-transferred to Triage

## 2018-06-03 ENCOUNTER — Ambulatory Visit (INDEPENDENT_AMBULATORY_CARE_PROVIDER_SITE_OTHER): Payer: 59 | Admitting: Physician Assistant

## 2018-06-03 ENCOUNTER — Encounter (INDEPENDENT_AMBULATORY_CARE_PROVIDER_SITE_OTHER): Payer: Self-pay | Admitting: Orthopaedic Surgery

## 2018-06-03 DIAGNOSIS — G5602 Carpal tunnel syndrome, left upper limb: Secondary | ICD-10-CM

## 2018-06-03 NOTE — Progress Notes (Signed)
Post-Op Visit Note   Patient: Claudia Wall           Date of Birth: Dec 28, 1975           MRN: [id number] Visit Date: 06/03/2018 PCP: [place] Bing, DO   Assessment & Plan:  Chief Complaint:  Chief Complaint  Patient presents with  . Left Hand - Pain   Visit Diagnoses:  1. Left carpal tunnel syndrome     Plan: Patient is a pleasant 43 year old female presents our clinic for 2 weeks status post left carpal tunnel release.  She has been doing well.  No fevers or chills.  She still has decreased sensation to her fingertips.  No longer having numbness or tingling at night that wakes her from her sleep.  She is taking tramadol as needed.  Examination of her left wrist reveals a well-healing surgical incision with nylon sutures in place.  No evidence of infection or cellulitis.  Today, we will remove the nylon sutures.  We will apply Steri-Strips.  She would like to attend formal hand therapy so we will write a prescription for this.  We will also provide her with a work note to return to work this coming Monday without restrictions.  She will wear a removable wrist splint when working for the first week or 2.  No heavy lifting or submerging the incision for 2 more weeks.  Follow-up with [name] in 4 weeks time for repeat evaluation.  Follow-Up Instructions: Return in about 4 weeks (around 07/01/2018).   Orders:  No orders of the defined types were placed in this encounter.  No orders of the defined types were placed in this encounter.   Imaging: No new imaging  PMFS History: Patient Active Problem List   Diagnosis Date Noted  . Headache 04/14/2018  . Bilateral carpal tunnel syndrome 02/09/2018  . Acute lumbar myofascial strain 12/29/2017  . Chronic post-traumatic stress disorder (PTSD) 12/29/2017  . Morbid obesity ([place]) 11/13/2017  . Insomnia due to stress 11/09/2017  . Chronic toe pain, left foot 11/09/2017  . RLQ abdominal pain 07/13/2017  . Other hyperlipidemia 05/13/2017    . Vitamin D deficiency 05/13/2017  . Prediabetes 05/13/2017  . Shortness of breath on exertion 04/29/2017  . Enlarged thyroid 04/29/2017  . ETOHism ([place]) 04/29/2017  . PCOS (polycystic ovarian syndrome) 04/29/2017  . Irregular periods/menstrual cycles 02/12/2017  . Left carpal tunnel syndrome 07/08/2016  . Lumbago with sciatica, left side 03/03/2016  . Carpal tunnel syndrome, right upper limb 07/07/2013  . Breast pain, left 05/12/2012  . Dyshidrotic eczema 11/06/2008  . Tobacco use disorder 11/01/2008  . Body mass index 40.0-44.9, adult ([place]) 02/03/2007  . Primary hypertension 05/28/2006  . GERD (gastroesophageal reflux disease) 05/28/2006   Past Medical History:  Diagnosis Date  . Alcohol abuse   . Anxiety   . Back pain   . Breast abscess   . Constipation   . Depression   . Gallbladder problem   . GERD (gastroesophageal reflux disease)   . HTN (hypertension)   . Hypertension   . Irregular periods/menstrual cycles   . Joint pain   . Lactose intolerance   . Leg edema   . Mastitis   . Obesity   . Obesity   . PCOS (polycystic ovarian syndrome)   . PTSD (post-traumatic stress disorder)   . Swallowing difficulty   . Tobacco abuse   . Tobacco abuse     Family History  Problem Relation Age of Onset  .  Cancer Mother   . Hypertension Mother   . Heart disease Mother   . Depression Mother   . Bipolar disorder Mother   . Schizophrenia Mother   . Alcoholism Mother   . Drug abuse Mother   . Obesity Mother   . Hypertension Father   . Diabetes Father     Past Surgical History:  Procedure Laterality Date  . CHOLECYSTECTOMY  2002  . INCISION AND DRAINAGE BREAST ABSCESS     Social History   Occupational History  . Not on file  Tobacco Use  . Smoking status: Current Every Day Smoker    Packs/day: 0.25    Years: 25.00    Pack years: 6.25    Types: Cigarettes  . Smokeless tobacco: Never Used  . Tobacco comment: "used to smoke 1/2 to 1ppd depending on stress"   Substance and Sexual Activity  . Alcohol use: Yes    Alcohol/week: 3.0 standard drinks    Types: 3 Glasses of wine per week    Comment: occassional  . Drug use: No  . Sexual activity: Yes    Birth control/protection: Other-see comments, None    Comment: With women

## 2018-06-30 ENCOUNTER — Telehealth (INDEPENDENT_AMBULATORY_CARE_PROVIDER_SITE_OTHER): Payer: Self-pay

## 2018-06-30 NOTE — Telephone Encounter (Signed)
  Called patient. No answer LMOM to return our call. If they return call, please ask them screening questions below. Thank you.   Do you have now or have you had in the past 7 days a fever and/or chills?   Do you have now or have you had in the past 7 days a cough?   Do you have now or have you had in the last 7 days nausea, vomiting or abdominal pain?   Have you been exposed to anyone who has tested positive for COVID-19?   Have you or anyone who lives with you traveled within the last month? 

## 2018-07-01 ENCOUNTER — Ambulatory Visit (INDEPENDENT_AMBULATORY_CARE_PROVIDER_SITE_OTHER): Payer: 59 | Admitting: Orthopaedic Surgery

## 2018-07-15 ENCOUNTER — Other Ambulatory Visit: Payer: Self-pay

## 2018-07-15 ENCOUNTER — Encounter: Payer: Self-pay | Admitting: Family Medicine

## 2018-07-15 ENCOUNTER — Telehealth: Payer: 59 | Admitting: Family Medicine

## 2018-07-15 NOTE — Progress Notes (Signed)
Patient called regarding pelvic cramping.  The patient reports that she has had intermittent pelvic discomfort the for the past year or so.  She was having heavy menses and had a Pap which showed HPV. She reports she underwent a biopsy which showed a polyp in the fall.  The patient reports she had an endometrial biopsy, however colposcopy would have been indicated in the setting of HPV positive Paps.  This was done at an outside gynecologist I was unable to find the records and care everywhere for review.   Her periods have been relatively regular since that time.  Her last period was 2 weeks ago since that time she has had worsening lower pelvic cramping.  She is not sexually active with men, has a female partner.  She is tried nothing for her symptoms.  She endorses some mild constipation.  She denies diarrhea, melena, vaginal discharge or vaginal bleeding.  She is concerned she has a cyst or polyp.  Recommended evaluation for abdominal and pelvic exam and assessment of abdominal pain.  Recommended in the interim patient try NSAIDs for lower pelvic cramping.  She will try Aleve later today and let us know the results of this.  Dorris Singh, MD  Family Medicine Teaching Service

## 2018-07-16 ENCOUNTER — Ambulatory Visit (INDEPENDENT_AMBULATORY_CARE_PROVIDER_SITE_OTHER): Payer: 59 | Admitting: Family Medicine

## 2018-07-16 ENCOUNTER — Other Ambulatory Visit (HOSPITAL_COMMUNITY)
Admission: RE | Admit: 2018-07-16 | Discharge: 2018-07-16 | Disposition: A | Discharge status: Home / Self Care | Payer: 59 | Source: Ambulatory Visit | Attending: Family Medicine | Admitting: Family Medicine

## 2018-07-16 ENCOUNTER — Other Ambulatory Visit: Payer: Self-pay

## 2018-07-16 VITALS — BP 128/80 | HR 92 | Temp 98.9°F | Wt 256.0 lb

## 2018-07-16 DIAGNOSIS — R102 Pelvic and perineal pain: Secondary | ICD-10-CM

## 2018-07-16 DIAGNOSIS — R7303 Prediabetes: Secondary | ICD-10-CM

## 2018-07-16 DIAGNOSIS — R109 Unspecified abdominal pain: Secondary | ICD-10-CM | POA: Insufficient documentation

## 2018-07-16 LAB — POCT WET PREP (WET MOUNT)
Clue Cells Wet Prep Whiff POC: NEGATIVE
Trichomonas Wet Prep HPF POC: ABSENT

## 2018-07-16 LAB — POCT URINALYSIS DIP (MANUAL ENTRY)
Bilirubin, UA: NEGATIVE
Blood, UA: NEGATIVE
Glucose, UA: NEGATIVE mg/dL
Ketones, POC UA: NEGATIVE mg/dL
Leukocytes, UA: NEGATIVE
Nitrite, UA: NEGATIVE
Protein Ur, POC: NEGATIVE mg/dL
Spec Grav, UA: 1.015 (ref 1.010–1.025)
Urobilinogen, UA: 0.2 E.U./dL
pH, UA: 5.5 (ref 5.0–8.0)

## 2018-07-16 LAB — POCT GLYCOSYLATED HEMOGLOBIN (HGB A1C): Hemoglobin A1C: 5.9 % — AB (ref 4.0–5.6)

## 2018-07-16 NOTE — Patient Instructions (Signed)
Thank you for coming to see me today. It was a pleasure! Today we talked about:   Your suprapubic pain.  Do not appear to have a urinary tract infection.  This may be related to irritable bowel syndrome or regular pelvic pain.  You may continue to take naproxen as needed for the pain.  Did not find anything abnormal on your exam.  We will call you with your lab results if there is anything abnormal, otherwise I can release him on my chart.  If your pain persists or worsens then please do not hesitate to call the office back during regular office hours or go to the emergency room.  Please follow-up as needed.  If you have any questions or concerns, please do not hesitate to call the office at [phone].  Take Care,   [place] Talishia Betzler, DO

## 2018-07-16 NOTE — Progress Notes (Signed)
Subjective:  Patient ID: Claudia Wall  DOB: [birth year] MRN: [id number]  Claudia Wall is a 43 y.o. female with a PMH of prediabetes, PCOS, carpal tunnel, hypertension, tobacco use disorder, insomnia, HPV, here today for pelvic pain.   HPI:  Pelvic pain: -Patient had telemedicine visit on 4/16 with Dr. Owens Shark regarding pelvic cramping.  She is coming in today for abdominal and pelvic exam in order to further assess her abdominal pain.  Patient was advised to try naproxen in the meantime. -Patient reports that naproxen helped with her pain that yesterday.   -She states that she has had intermittent pelvic pain for the past year.  States that she is having regular periods however when she does have her.  She has heavy menses.  She is followed up at an OB/GYN who recently performed her Pap smear and found that she was HPV positive. -Patient states that she also has an ultrasound which has shown polyps before and she is concerned that she may have a cyst or polyp at this time causing her pain. -Patient denies any nausea, vomiting, diarrhea, constipation.   -Reports that she is sexually active with female partners however has not been sexually active in the past month. -Patient denies any vaginal discharge or irregular bleeding.  She denies any vaginal odor.  States that she has had STDs in the past including trichomoniasis.    Prediabetes -Patient reports that she was diagnosed with prediabetes in 2019 and has not had a follow-up of her A1c.  She is requesting to have this repeated today.  Patient denies any polydipsia, polyuria.   ROS: All other systems otherwise negative, except as mentioned in HPI  Social hx: Denies use of illicit drugs, reports occasional alcohol use Smoking status reviewed  Patient Active Problem List   Diagnosis Date Noted  . Pelvic pain 07/18/2018  . Headache 04/14/2018  . Bilateral carpal tunnel syndrome 02/09/2018  . Acute lumbar myofascial strain 12/29/2017  .  Chronic post-traumatic stress disorder (PTSD) 12/29/2017  . Morbid obesity ([place]) 11/13/2017  . Insomnia due to stress 11/09/2017  . Chronic toe pain, left foot 11/09/2017  . RLQ abdominal pain 07/13/2017  . Other hyperlipidemia 05/13/2017  . Vitamin D deficiency 05/13/2017  . Prediabetes 05/13/2017  . Shortness of breath on exertion 04/29/2017  . Enlarged thyroid 04/29/2017  . ETOHism ([place]) 04/29/2017  . PCOS (polycystic ovarian syndrome) 04/29/2017  . Irregular periods/menstrual cycles 02/12/2017  . Left carpal tunnel syndrome 07/08/2016  . Lumbago with sciatica, left side 03/03/2016  . Carpal tunnel syndrome, right upper limb 07/07/2013  . Breast pain, left 05/12/2012  . Dyshidrotic eczema 11/06/2008  . Tobacco use disorder 11/01/2008  . Body mass index 40.0-44.9, adult ([place]) 02/03/2007  . Primary hypertension 05/28/2006  . GERD (gastroesophageal reflux disease) 05/28/2006     Objective:  BP 128/80   Pulse 92   Temp 98.9 F (37.2 C) (Oral)   Wt 256 lb (116.1 kg)   LMP 06/23/2018   SpO2 99%   BMI 45.35 kg/m   Vitals and nursing note reviewed  General: NAD, pleasant Pulm: normal effort GI: soft, nontender, nondistended, active bowel sounds x4 GU/GYN: External genitalia within normal limits.  Vaginal mucosa pink, moist, normal rugae.  Nonfriable cervix without lesions, no discharge or bleeding noted on speculum exam.  Bimanual exam revealed normal, nongravid uterus.  No cervical motion tenderness. No adnexal masses bilaterally, although difficult exam given body habitus. Exam performed in the presence of a chaperone. Extremities: no  edema or cyanosis. WWP. Skin: warm and dry, no rashes noted Neuro: alert and oriented, no focal deficits Psych: normal affect, normal thought content  Assessment & Plan:   Prediabetes Hemoglobin A1c 5.9 during office visit.  She reports compliance with her metformin 500 mg daily.  May consider increasing this at next visit. Encouraged diet  and lifestyle changes.  Pelvic pain Patient with intermittent pelvic pain for the past year with regular menstrual periods.  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  Patient reports that she does have heavy periods.  -UA without any signs of infection -Wet prep with no signs of infection -Gonorrhea and chlamydia pending -No masses noted on exam -Patient noted improvement after taking NSAID naproxen.  Encourage patient to continue taking NSAIDs for her pelvic pain. -Patient reports that she recently had an ultrasound performed of her abdomen and pelvis however unable to see this in our system.  Also given positive HPV on Pap smear likely that patient had a colposcopy however unable to see records. -Patient states that she is less concerned and will follow-up with her OB/GYN as needed. -Strict return precautions discussed, red flags discussed.   [place] Buffey Zabinski, DO Family Medicine Resident PGY-2

## 2018-07-18 DIAGNOSIS — R102 Pelvic and perineal pain: Secondary | ICD-10-CM | POA: Insufficient documentation

## 2018-07-18 NOTE — Assessment & Plan Note (Signed)
Patient with intermittent pelvic pain for the past year with regular menstrual periods.  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  Patient reports that she does have heavy periods.  -UA without any signs of infection -Wet prep with no signs of infection -Gonorrhea and chlamydia pending -No masses noted on exam -Patient noted improvement after taking NSAID naproxen.  Encourage patient to continue taking NSAIDs for her pelvic pain. -Patient reports that she recently had an ultrasound performed of her abdomen and pelvis however unable to see this in our system.  Also given positive HPV on Pap smear likely that patient had a colposcopy however unable to see records. -Patient states that she is less concerned and will follow-up with her OB/GYN as needed. -Strict return precautions discussed, red flags discussed.

## 2018-07-18 NOTE — Assessment & Plan Note (Signed)
Hemoglobin A1c 5.9 during office visit.  She reports compliance with her metformin 500 mg daily.  May consider increasing this at next visit. Encouraged diet and lifestyle changes.

## 2018-07-20 LAB — CERVICOVAGINAL ANCILLARY ONLY
Chlamydia: NEGATIVE
Neisseria Gonorrhea: NEGATIVE

## 2018-08-30 ENCOUNTER — Other Ambulatory Visit: Payer: Self-pay | Admitting: Family Medicine

## 2018-08-30 ENCOUNTER — Telehealth: Payer: Self-pay | Admitting: Family Medicine

## 2018-08-30 DIAGNOSIS — B3731 Acute candidiasis of vulva and vagina: Secondary | ICD-10-CM

## 2018-08-30 DIAGNOSIS — B373 Candidiasis of vulva and vagina: Secondary | ICD-10-CM

## 2018-08-30 MED ORDER — FLUCONAZOLE 150 MG PO TABS: ORAL_TABLET | ORAL | 0 refills | Status: DC

## 2018-08-30 NOTE — Telephone Encounter (Signed)
Returning call for request for Diflucan.  Patient using Dove wipes but feels like she caused irritation and has itching and pain in the area along with some slight white discharge.  He denies odor.  She does have a history of vaginal candidiasis which she feels is the problem.  Called in prescription for 2 doses Diflucan per patient request.  Reviewed return precautions.  Harriet [place], [place], PGY-3

## 2018-08-30 NOTE — Telephone Encounter (Signed)
Pt called for a refill on her DIFLUCAN. Please give pt a call back.

## 2018-09-14 ENCOUNTER — Encounter: Payer: Self-pay | Admitting: Family Medicine

## 2018-09-14 ENCOUNTER — Ambulatory Visit (INDEPENDENT_AMBULATORY_CARE_PROVIDER_SITE_OTHER): Payer: PRIVATE HEALTH INSURANCE | Admitting: Family Medicine

## 2018-09-14 ENCOUNTER — Other Ambulatory Visit: Payer: Self-pay

## 2018-09-14 VITALS — BP 112/88 | HR 87 | Wt 266.6 lb

## 2018-09-14 DIAGNOSIS — Z6841 Body Mass Index (BMI) 40.0 and over, adult: Secondary | ICD-10-CM | POA: Diagnosis not present

## 2018-09-14 DIAGNOSIS — I10 Essential (primary) hypertension: Secondary | ICD-10-CM

## 2018-09-14 DIAGNOSIS — F4312 Post-traumatic stress disorder, chronic: Secondary | ICD-10-CM

## 2018-09-14 MED ORDER — FLUOXETINE HCL 20 MG PO TABS: 20.0000 mg | ORAL_TABLET | Freq: Every day | ORAL | 3 refills | Status: DC

## 2018-09-14 NOTE — Progress Notes (Signed)
   Subjective   Patient ID: Claudia Wall    DOB: 18-Jan-1976, 43 y.o. female   MRN: [id number]  CC: "Diabetes follow-up"  HPI: [name] is a 43 y.o. female who presents to clinic today for the following:  Prediabetes: [name] is here today to follow-up for routine checkup.  Her last 2 A1c's have been in the prediabetic range.  She does have class II obesity with multiple comorbidities including hypertension.  She is currently enrolled with the bariatric services clinic and had her first appointment earlier today.  Patient felt this was helpful and is optimistic and hopeful that her insurance will cover the remainder of her program.  She is hopeful to get off all of her medications when she is able to get bariatric surgery.  She denies polyuria, polyphagia, polydipsia.  Hypertension: Patient reports good adherence to hydrochlorothiazide.  She rarely smokes 1 cigarette every several months which she attributes to "an individual."  She used to be a regular smoker.  Anxiety: Previously well controlled with Prozac 40 mg daily, however she reports associated palpitations after taking Prozac the last 2 times and discontinued the medication a few weeks ago.  She is hopeful to restart the medication at a lower dose at 20 mg which she feels was adequate to control her symptoms.  She denies any associated shortness of breath or chest pain.  She does not get palpitations while off the medication.  ROS: see HPI for pertinent.  [place]: Reviewed. Smoking status reviewed. Medications reviewed.  Objective   BP 112/88   Pulse 87   Wt 266 lb 9.6 oz (120.9 kg)   SpO2 99%   BMI 47.23 kg/m  Vitals and nursing note reviewed.  General: well nourished, well developed, NAD with non-toxic appearance HEENT: normocephalic, atraumatic, moist mucous membranes Neck: supple, non-tender without lymphadenopathy Cardiovascular: regular rate and rhythm without murmurs, rubs, or gallops Lungs: clear to  auscultation bilaterally with normal work of breathing Abdomen: soft, non-tender, non-distended, normoactive bowel sounds Skin: warm, dry, no rashes or lesions, cap refill < 2 seconds Extremities: warm and well perfused, normal tone, no edema Psych: euthymic mood, congruent affect  Assessment & Plan   Primary hypertension Chronic.  Controlled.  Minimal smoker. - Discussed importance of avoiding all tobacco products - Continue HCTZ 25 mg daily  Body mass index 40.0-44.9, adult ([place]) Currently enrolled with bariatric services.  Open to bariatric surgery.  Making lifestyle modifications including diet and exercise. - Advised to continue exercising at her tolerance which is primarily limited to her back pain with a goal of achieving 150 minutes/week - Set diet goals including limiting portion control, and restricting alcohol and desserts to no more than 2/week and avoiding snacks altogether, also advised to drink 2 glasses of water before meals and start eating breakfast regularly - RTC 1 month  Chronic post-traumatic stress disorder (PTSD) Seems to be having associated palpitations with 40 mg daily.  Reports good results with 20 mg daily.  Unfortunately unable to follow-up with psychiatrist since discontinuing their practice. - Switched Prozac to 20 mg daily  No orders of the defined types were placed in this encounter.  Meds ordered this encounter  Medications  . FLUoxetine (PROZAC) 20 MG tablet    Sig: Take 1 tablet (20 mg total) by mouth daily.    Dispense:  90 tablet    Refill:  [place], [place], PGY-3 09/14/2018, 4:40 PM

## 2018-09-14 NOTE — Assessment & Plan Note (Signed)
Currently enrolled with bariatric services.  Open to bariatric surgery.  Making lifestyle modifications including diet and exercise. - Advised to continue exercising at her tolerance which is primarily limited to her back pain with a goal of achieving 150 minutes/week - Set diet goals including limiting portion control, and restricting alcohol and desserts to no more than 2/week and avoiding snacks altogether, also advised to drink 2 glasses of water before meals and start eating breakfast regularly - RTC 1 month

## 2018-09-14 NOTE — Assessment & Plan Note (Signed)
Seems to be having associated palpitations with 40 mg daily.  Reports good results with 20 mg daily.  Unfortunately unable to follow-up with psychiatrist since discontinuing their practice. - Switched Prozac to 20 mg daily

## 2018-09-14 NOTE — Patient Instructions (Signed)
Thank you for coming in to see [name] today. Please see below to review our plan for today's visit.  1. I called in your reduced strength Prozac. Please take 20 mg capsules daily. 2. Your next A1c is due in 1 month. We will see you then. 3. Start eating breakfast and drink 2 glasses of water prior to meals. No snacking between meals. Limit alcohol to no more than 2 drinks per week OR 1 drink and a dessert.   Please call the clinic at [phone] if your symptoms worsen or you have any concerns. It was our pleasure to serve you.  Harriet Butte, [place], PGY-3

## 2018-09-14 NOTE — Assessment & Plan Note (Signed)
Chronic.  Controlled.  Minimal smoker. - Discussed importance of avoiding all tobacco products - Continue HCTZ 25 mg daily

## 2018-09-16 ENCOUNTER — Telehealth: Payer: Self-pay | Admitting: *Deleted

## 2018-09-16 NOTE — Telephone Encounter (Signed)
Pt is requesting refill of methocarbamol (on med list) and meloxicam (not on current med list). To MD. Christen Bame, CMA

## 2018-09-17 NOTE — Telephone Encounter (Signed)
These meds are for an acute event on 12/2017. Not appropriate refill. Please advise.  Harriet Butte, [place], PGY-3

## 2018-09-20 NOTE — Telephone Encounter (Signed)
Patient is exercising by walking  and is having pain that radiates down her legs from her back.  Patient states that she has tried over the counter medication to no avail.  Patient states that she needs something a little stronger to help with the pain so as she can continue to walk.  Claudia Wall, [place]

## 2018-09-22 NOTE — Telephone Encounter (Signed)
Pt informed and scheduled for a appt tomorrow. Leyli Kevorkian Kennon Holter, CMA

## 2018-09-22 NOTE — Telephone Encounter (Signed)
I need to know what meds she is taking. If sciatica is continuing to be a problem, would advise patient to return to clinic (unable to discuss this much during last visit). May need a physical exam. Would discuss medications and imaging at that point.  Harriet Butte, [place], PGY-3

## 2018-09-23 ENCOUNTER — Other Ambulatory Visit: Payer: Self-pay

## 2018-09-23 ENCOUNTER — Ambulatory Visit (INDEPENDENT_AMBULATORY_CARE_PROVIDER_SITE_OTHER): Payer: PRIVATE HEALTH INSURANCE | Admitting: Family Medicine

## 2018-09-23 ENCOUNTER — Encounter: Payer: Self-pay | Admitting: Family Medicine

## 2018-09-23 VITALS — BP 138/94 | HR 91 | Wt 265.0 lb

## 2018-09-23 DIAGNOSIS — G8929 Other chronic pain: Secondary | ICD-10-CM | POA: Diagnosis not present

## 2018-09-23 DIAGNOSIS — R7303 Prediabetes: Secondary | ICD-10-CM | POA: Diagnosis not present

## 2018-09-23 DIAGNOSIS — M545 Low back pain: Secondary | ICD-10-CM

## 2018-09-23 DIAGNOSIS — E66813 Obesity, class 3: Secondary | ICD-10-CM | POA: Insufficient documentation

## 2018-09-23 MED ORDER — MELOXICAM 15 MG PO TABS: 15.0000 mg | ORAL_TABLET | Freq: Every day | ORAL | 0 refills | Status: DC

## 2018-09-23 NOTE — Progress Notes (Signed)
   Subjective   Patient ID: Claudia Wall    DOB: 16-May-1975, 43 y.o. female   MRN: [id number]  CC: "Back pain"  HPI: Claudia Wall is a 43 y.o. female who presents to clinic today for the following:  BACK PAIN  Has class III obesity with a BMI of 45, was working with bariatric clinic, however her insurance company denied this. No back imaging upon chart view.  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  [place]: Reviewed. Smoking status reviewed. Medications reviewed.  Objective   BP (!) 138/94   Pulse 91   Wt 265 lb (120.2 kg)   LMP 08/21/2018 (Approximate)   SpO2 96%   BMI 46.94 kg/m  Vitals and nursing note reviewed.  General: obese, well nourished, well developed, NAD with non-toxic appearance HEENT: normocephalic, atraumatic, moist mucous membranes Neck: supple, non-tender without lymphadenopathy Cardiovascular: regular rate and rhythm without murmurs, rubs, or gallops Lungs: clear to auscultation bilaterally with normal work of breathing Skin: warm, dry, no rashes or lesions MSK: 5/5 motor strength bilaterally with negative straight leg raise, negative Faber test, there is moderate tenderness to the paraspinal region bilaterally sparing central spine localized to the L4-L5 region, neurovascular intact  Assessment & Plan   Chronic bilateral low back pain  without sciatica Chronic.  Seems to be paraspinal muscle spasms likely related to obesity.  No radicular symptoms or red flags.  Does not seem to involve acetabulum based on exam.  May consider plain films at follow-up.  Would consider Voltaren gel if Mobic fails. - Prescribed Mobic 15 mg daily - Reviewed return precautions, RTC 2 weeks - Ambulatory referral to bariatric services for morbid obesity with prediabetes  Orders Placed This Encounter  Procedures  . Amb Referral to Bariatric Surgery    Referral Priority:   Routine    Referral Type:   Consultation    Number of Visits Requested:   1   Meds ordered this encounter  Medications  . meloxicam (MOBIC) 15 MG tablet    Sig: Take 1 tablet (15 mg total) by mouth daily.    Dispense:  30 tablet    Refill:  0    Harriet Butte, [place], PGY-3 09/23/2018, 3:54 PM

## 2018-09-23 NOTE — Patient Instructions (Signed)
Thank you for coming in to see [name] today. Please see below to review our plan for today's visit.  Take the Mobic daily for 2 weeks and follow up with [name].  Please call the clinic at [phone] if your symptoms worsen or you have any concerns. It was our pleasure to serve you.  Harriet Butte, [place], PGY-3

## 2018-09-23 NOTE — Assessment & Plan Note (Signed)
Chronic.  Seems to be paraspinal muscle spasms likely related to obesity.  No radicular symptoms or red flags.  Does not seem to involve acetabulum based on exam.  May consider plain films at follow-up.  Would consider Voltaren gel if Mobic fails. - Prescribed Mobic 15 mg daily - Reviewed return precautions, RTC 2 weeks - Ambulatory referral to bariatric services for morbid obesity with prediabetes

## 2018-10-14 ENCOUNTER — Telehealth: Payer: Self-pay | Admitting: Family Medicine

## 2018-10-14 NOTE — Telephone Encounter (Signed)
Pt called to see if she could get a release of information stating when she can return back to work.Please give pt a call back.

## 2018-10-18 ENCOUNTER — Telehealth: Payer: Self-pay | Admitting: Family Medicine

## 2018-10-18 NOTE — Telephone Encounter (Signed)
Pt would like for Dr. Kris Mouton to write a note stating she may be released back to work at full duty. She is no longer having issues with her back.  Pt would like to pick this letter up at the front desk. Please call pt when it has been written and ready to be picked up.

## 2018-10-20 NOTE — Telephone Encounter (Signed)
Okay to go back to work in full duty.  Please let patient know this will be available for pickup later on this morning.  Guadalupe Dawn MD PGY-3 Family Medicine Resident

## 2018-10-20 NOTE — Telephone Encounter (Signed)
Informed patient of return to work letter.  She states that she will pick it up on 10/21/2018.  Claudia Wall, [place]

## 2018-11-03 ENCOUNTER — Other Ambulatory Visit: Payer: Self-pay

## 2018-11-03 DIAGNOSIS — Z20822 Contact with and (suspected) exposure to covid-19: Secondary | ICD-10-CM

## 2018-11-04 LAB — NOVEL CORONAVIRUS, NAA: SARS-CoV-2, NAA: NOT DETECTED

## 2018-11-25 ENCOUNTER — Ambulatory Visit (INDEPENDENT_AMBULATORY_CARE_PROVIDER_SITE_OTHER): Payer: PRIVATE HEALTH INSURANCE | Admitting: Orthopaedic Surgery

## 2018-11-25 ENCOUNTER — Encounter: Payer: Self-pay | Admitting: Orthopaedic Surgery

## 2018-11-25 DIAGNOSIS — G5601 Carpal tunnel syndrome, right upper limb: Secondary | ICD-10-CM | POA: Diagnosis not present

## 2018-11-25 MED ORDER — LIDOCAINE HCL 1 % IJ SOLN
1.0000 mL | INTRAMUSCULAR | Status: AC | PRN
  Administered 2018-11-25: 11:00:00 1 mL

## 2018-11-25 MED ORDER — BUPIVACAINE HCL 0.5 % IJ SOLN
1.0000 mL | INTRAMUSCULAR | Status: AC | PRN
  Administered 2018-11-25: 1 mL

## 2018-11-25 MED ORDER — METHYLPREDNISOLONE ACETATE 40 MG/ML IJ SUSP
40.0000 mg | INTRAMUSCULAR | Status: AC | PRN
  Administered 2018-11-25: 40 mg

## 2018-11-25 NOTE — Progress Notes (Signed)
Office Visit Note   Patient: Claudia Wall           Date of Birth: Oct 10, 1975           MRN: [id number] Visit Date: 11/25/2018              Requested by: Guadalupe Dawn, MD [phone],  [place] [address] PCP: Guadalupe Dawn, MD   Assessment & Plan: Visit Diagnoses:  1. Right carpal tunnel syndrome     Plan: We repeated the right carpal tunnel injection today.  Patient tolerates well.  She is looking at having her right carpal tunnel surgery in February of next year.  Follow-Up Instructions: Return if symptoms worsen or fail to improve.   Orders:  No orders of the defined types were placed in this encounter.  No orders of the defined types were placed in this encounter.     Procedures: Hand/UE Inj: R carpal tunnel for carpal tunnel syndrome on 11/25/2018 11:16 AM Indications: pain Details: 25 G needle Medications: 1 mL lidocaine 1 %; 1 mL bupivacaine 0.5 %; 40 mg methylPREDNISolone acetate 40 MG/ML Outcome: tolerated well, no immediate complications Patient was prepped and draped in the usual sterile fashion.       Clinical Data: No additional findings.   Subjective: Chief Complaint  Patient presents with  . Right Wrist - Pain    Debhora comes back today for recurrent symptoms of her right carpal tunnel syndrome.  She is requesting another injection.  Previous injection helped tremendously.  She would like to get another injection to hopefully postpone surgery until next year.   Review of Systems   Objective: Vital Signs: There were no vitals taken for this visit.  Physical Exam  Ortho Exam Right hand exam is unchanged. Specialty Comments:  No specialty comments available.  Imaging: No results found.   PMFS History: Patient Active Problem List   Diagnosis Date Noted  . Chronic bilateral low back pain without sciatica 09/23/2018  . Obesity, Class III, BMI 40-49.9 (morbid obesity) ([place]) 09/23/2018  . Pelvic pain 07/18/2018  .  Headache 04/14/2018  . Bilateral carpal tunnel syndrome 02/09/2018  . Acute lumbar myofascial strain 12/29/2017  . Chronic post-traumatic stress disorder (PTSD) 12/29/2017  . Morbid obesity ([place]) 11/13/2017  . Insomnia due to stress 11/09/2017  . Chronic toe pain, left foot 11/09/2017  . RLQ abdominal pain 07/13/2017  . Other hyperlipidemia 05/13/2017  . Vitamin D deficiency 05/13/2017  . Prediabetes 05/13/2017  . Shortness of breath on exertion 04/29/2017  . Enlarged thyroid 04/29/2017  . ETOHism ([place]) 04/29/2017  . PCOS (polycystic ovarian syndrome) 04/29/2017  . Irregular periods/menstrual cycles 02/12/2017  . Left carpal tunnel syndrome 07/08/2016  . Lumbago with sciatica, left side 03/03/2016  . Carpal tunnel syndrome, right upper limb 07/07/2013  . Breast pain, left 05/12/2012  . Dyshidrotic eczema 11/06/2008  . Tobacco use disorder 11/01/2008  . Body mass index 40.0-44.9, adult ([place]) 02/03/2007  . Primary hypertension 05/28/2006  . GERD (gastroesophageal reflux disease) 05/28/2006   Past Medical History:  Diagnosis Date  . Alcohol abuse   . Anxiety   . Back pain   . Breast abscess   . Constipation   . Depression   . Gallbladder problem   . GERD (gastroesophageal reflux disease)   . HTN (hypertension)   . Hypertension   . Irregular periods/menstrual cycles   . Joint pain   . Lactose intolerance   . Leg edema   . Mastitis   .  Obesity   . Obesity   . PCOS (polycystic ovarian syndrome)   . PTSD (post-traumatic stress disorder)   . Swallowing difficulty   . Tobacco abuse   . Tobacco abuse     Family History  Problem Relation Age of Onset  . Cancer Mother   . Hypertension Mother   . Heart disease Mother   . Depression Mother   . Bipolar disorder Mother   . Schizophrenia Mother   . Alcoholism Mother   . Drug abuse Mother   . Obesity Mother   . Hypertension Father   . Diabetes Father     Past Surgical History:  Procedure Laterality Date  .  CHOLECYSTECTOMY  2002  . INCISION AND DRAINAGE BREAST ABSCESS     Social History   Occupational History  . Not on file  Tobacco Use  . Smoking status: Light Tobacco Smoker    Packs/day: 0.25    Years: 25.00    Pack years: 6.25    Types: Cigarettes  . Smokeless tobacco: Never Used  . Tobacco comment: "used to smoke 1/2 to 1ppd depending on stress"  Substance and Sexual Activity  . Alcohol use: Yes    Alcohol/week: 3.0 standard drinks    Types: 3 Glasses of wine per week    Comment: occassional  . Drug use: No  . Sexual activity: Yes    Birth control/protection: Other-see comments, None    Comment: With women

## 2019-01-07 ENCOUNTER — Other Ambulatory Visit: Payer: Self-pay

## 2019-01-07 ENCOUNTER — Ambulatory Visit (INDEPENDENT_AMBULATORY_CARE_PROVIDER_SITE_OTHER): Payer: PRIVATE HEALTH INSURANCE | Admitting: Family Medicine

## 2019-01-07 VITALS — BP 118/62 | HR 75 | Temp 98.7°F | Wt 253.0 lb

## 2019-01-07 DIAGNOSIS — E7849 Other hyperlipidemia: Secondary | ICD-10-CM

## 2019-01-07 DIAGNOSIS — I10 Essential (primary) hypertension: Secondary | ICD-10-CM | POA: Diagnosis not present

## 2019-01-07 DIAGNOSIS — E559 Vitamin D deficiency, unspecified: Secondary | ICD-10-CM | POA: Diagnosis not present

## 2019-01-07 DIAGNOSIS — R7309 Other abnormal glucose: Secondary | ICD-10-CM | POA: Diagnosis not present

## 2019-01-07 DIAGNOSIS — K5904 Chronic idiopathic constipation: Secondary | ICD-10-CM

## 2019-01-07 LAB — POCT GLYCOSYLATED HEMOGLOBIN (HGB A1C): Hemoglobin A1C: 5.9 % — AB (ref 4.0–5.6)

## 2019-01-07 NOTE — Progress Notes (Signed)
   [facility] Phone: [phone]     Claudia Wall - 43 y.o. female MRN [id number]  Date of birth: Jun 05, 1975  Subjective:   cc: constipation  HPI:  Constipation: took super colon cleanse: last wed, thurs, fri.  Was having multiple bowel movements a day while taking this. Didn't take it on the weekend.  Took it Monday and Tuesday, had a BM, haven't taken it since then and hasn't had a BM since.  chronically having BM maybe twice a week.  She was straining, sometimes seeing red blood on her toilet paper. Has tried metamucil, miralax, and magnesium citrate in the past as needed.   ROS: See HPI for pertinent positives and negatives  Past Medical History  Family history reviewed for today's visit. No changes.  Social history- patient is a current smoker   Objective:   BP 118/62   Pulse 75   Temp 98.7 F (37.1 C) (Oral)   Wt 253 lb (114.8 kg)   LMP 12/11/2018   SpO2 99%   BMI 44.82 kg/m  Gen: NAD, alert and oriented, cooperative with exam GI: nontender to palpation, BS present, no guarding or organomegaly  Assessment/Plan:   Constipation Chronic issue. Goes twice a week usually and has to strain sometimes with occasional blood in stool.  Has been trying laxative/cleansing agents/stool softeners PRN in the past.   - daily miralax.  Start with one capful a day, advance to twice a day, and two capfuls twice a day if necessary with the goal of one soft BM every day, two days max.    Clemetine Marker, MD PGY-2 [facility]

## 2019-01-07 NOTE — Patient Instructions (Signed)
For constipation the best thing to take his MiraLAX. Start with 1 capful a day.  If after 2 days you have not have a bowel movement increase to 1 Twice a day with the goal of having 1 bowel movement a day or every 2 days at most.  You can increase MiraLAX as needed if this is still not having the desired effect.  Increase by it going to 2 capfuls in the morning and 2 capsules in the evening.  Also drink plenty of water.  Try not to take any other medications for constipation including your super colon cleanse for her magnesium.  I will call you with results of your lab work when I get it.  Have a great day,  Clemetine Marker, MD

## 2019-01-08 DIAGNOSIS — K5900 Constipation, unspecified: Secondary | ICD-10-CM | POA: Insufficient documentation

## 2019-01-08 LAB — BASIC METABOLIC PANEL
BUN/Creatinine Ratio: 16 (ref 9–23)
BUN: 15 mg/dL (ref 6–24)
CO2: 26 mmol/L (ref 20–29)
Calcium: 9.8 mg/dL (ref 8.7–10.2)
Chloride: 102 mmol/L (ref 96–106)
Creatinine, Ser: 0.92 mg/dL (ref 0.57–1.00)
GFR calc Af Amer: 88 mL/min/{1.73_m2} (ref >59)
GFR calc non Af Amer: 77 mL/min/{1.73_m2} (ref >59)
Glucose: 101 mg/dL — ABNORMAL HIGH (ref 65–99)
Potassium: 4.3 mmol/L (ref 3.5–5.2)
Sodium: 138 mmol/L (ref 134–144)

## 2019-01-08 LAB — LIPID PANEL
Chol/HDL Ratio: 3.3 ratio (ref 0.0–4.4)
Cholesterol, Total: 154 mg/dL (ref 100–199)
HDL: 46 mg/dL (ref >39)
LDL Chol Calc (NIH): 92 mg/dL (ref 0–99)
Triglycerides: 87 mg/dL (ref 0–149)
VLDL Cholesterol Cal: 16 mg/dL (ref 5–40)

## 2019-01-08 LAB — VITAMIN D 25 HYDROXY (VIT D DEFICIENCY, FRACTURES): Vit D, 25-Hydroxy: 32.3 ng/mL (ref 30.0–100.0)

## 2019-01-08 NOTE — Assessment & Plan Note (Signed)
Chronic issue. Goes twice a week usually and has to strain sometimes with occasional blood in stool.  Has been trying laxative/cleansing agents/stool softeners PRN in the past.   - daily miralax.  Start with one capful a day, advance to twice a day, and two capfuls twice a day if necessary with the goal of one soft BM every day, two days max.

## 2019-01-11 ENCOUNTER — Other Ambulatory Visit: Payer: Self-pay | Admitting: Family Medicine

## 2019-01-11 DIAGNOSIS — E559 Vitamin D deficiency, unspecified: Secondary | ICD-10-CM

## 2019-01-11 MED ORDER — VITAMIN D 50 MCG (2000 UT) PO CAPS: ORAL_CAPSULE | ORAL | 11 refills | Status: DC

## 2019-01-11 MED ORDER — HYDROCHLOROTHIAZIDE 25 MG PO TABS: 25.0000 mg | ORAL_TABLET | Freq: Every day | ORAL | 3 refills | Status: DC

## 2019-01-11 NOTE — Progress Notes (Unsigned)
Called and informed pt of lab results.  Vit D on the low end of normal, gave pt rx for vit D 1000 U qd and refilled her hctz until she can see dr.fletcher.

## 2019-01-31 ENCOUNTER — Telehealth: Payer: Self-pay | Admitting: Family Medicine

## 2019-01-31 DIAGNOSIS — B373 Candidiasis of vulva and vagina: Secondary | ICD-10-CM

## 2019-01-31 DIAGNOSIS — B3731 Acute candidiasis of vulva and vagina: Secondary | ICD-10-CM

## 2019-01-31 NOTE — Telephone Encounter (Signed)
Patient would like a prescription for diflucan. Patient easily gets yeast infections when changing her bath soap or laundry detergent/powders and she recently did that and now has yeast infection. Her pharmacy is [facility] [address].

## 2019-02-01 MED ORDER — FLUCONAZOLE 150 MG PO TABS: ORAL_TABLET | ORAL | 0 refills | Status: DC

## 2019-02-01 NOTE — Telephone Encounter (Signed)
Pt informed. Aubriel Khanna, CMA  

## 2019-02-01 NOTE — Telephone Encounter (Signed)
Diflucan sent to [facility] on [address]. Please let patient know this has been sent in.   Guadalupe Dawn MD PGY-3 Family Medicine Resident

## 2019-02-17 ENCOUNTER — Ambulatory Visit (INDEPENDENT_AMBULATORY_CARE_PROVIDER_SITE_OTHER): Payer: PRIVATE HEALTH INSURANCE | Admitting: Family Medicine

## 2019-02-17 ENCOUNTER — Other Ambulatory Visit: Payer: Self-pay

## 2019-02-17 ENCOUNTER — Other Ambulatory Visit (HOSPITAL_COMMUNITY)
Admission: RE | Admit: 2019-02-17 | Discharge: 2019-02-17 | Disposition: A | Discharge status: Home / Self Care | Payer: 59 | Source: Ambulatory Visit | Attending: Family Medicine | Admitting: Family Medicine

## 2019-02-17 VITALS — BP 128/96 | HR 92 | Wt 255.0 lb

## 2019-02-17 DIAGNOSIS — N898 Other specified noninflammatory disorders of vagina: Secondary | ICD-10-CM | POA: Diagnosis not present

## 2019-02-17 DIAGNOSIS — N760 Acute vaginitis: Secondary | ICD-10-CM

## 2019-02-17 DIAGNOSIS — B9689 Other specified bacterial agents as the cause of diseases classified elsewhere: Secondary | ICD-10-CM

## 2019-02-17 DIAGNOSIS — N949 Unspecified condition associated with female genital organs and menstrual cycle: Secondary | ICD-10-CM | POA: Diagnosis not present

## 2019-02-17 LAB — POCT WET PREP (WET MOUNT)
Clue Cells Wet Prep Whiff POC: POSITIVE
Trichomonas Wet Prep HPF POC: ABSENT

## 2019-02-17 MED ORDER — METRONIDAZOLE 500 MG PO TABS: 500.0000 mg | ORAL_TABLET | Freq: Two times a day (BID) | ORAL | 0 refills | Status: DC

## 2019-02-17 MED ORDER — METRONIDAZOLE 0.75 % VA GEL: 1.0000 | Freq: Every day | VAGINAL | 0 refills | Status: DC

## 2019-02-17 NOTE — Assessment & Plan Note (Signed)
Unclear etiology for vaginal discomfort at this time.  There certainly may be a contribution from her bacterial vaginosis, will treat as noted.  I do not suspect that treating her BV will resolve vulvovaginal discomfort.  The differential includes atrophic vaginitis, vaginal sclerosis, lichen planus, hypersensitivity reaction.  Physical exam makes atrophic vaginitis, sclerosis and lichen planus less likely.  She has noted significant sensitivity to lubricants in the past and the above-noted vaginal dryness may be a hypersensitivity reaction.  For now, plan to treat BV as noted and encouraged use of lubricants for comfort as needed.  We will also have her fill out a release of information to obtain [facility] from [facility].

## 2019-02-17 NOTE — Progress Notes (Signed)
Subjective:  Claudia Wall is a 43 y.o. female who presents to the [facility] today with a chief complaint of vaginal dryness.   HPI: Vaginal dryness [name] reports that she has been having issues with vaginal dryness for at least the past 2 years.  She has [facility] by [facility] who has helped to manage this vaginal dryness.  She has previously been given topical medication and vaginal suppositories to help with this issue.  She is not sure what either these medications were called that she does not have the labels available anymore because she removed them to keep her children from asking questions about she had stored in the refrigerator.  She was previously sexually active heterosexually and has given birth to 2 children.  In recent years, she has transition to homosexual relationships.  She most recently had a sexual encounter 1 week ago at which time she noted that she was not able to maintain moist vaginal mucosa.  This is ultimately what triggered her appointment to be assessed in clinic.  She is previously had irregular periods which she believes was related to PCOS.  For the last several months, she has been having very irregular periods with normal abdominal cramping.  She is also found that she has hot flashes often accompany her periods for the past year or so.  She denies fever, vomiting, nausea, abdominal pain, dysuria, polyuria, any vaginal discharge, vaginal itching  Chief Complaint noted Review of Symptoms - see HPI PMH - Smoking status noted.    Objective:  Physical Exam: BP (!) 128/96   Pulse 92   Wt 255 lb (115.7 kg)   SpO2 98%   BMI 45.17 kg/m    Gen: NAD, resting comfortably seated on the exam table. Skin: Warm, dry Awake exam: Normal female external genitalia.  Healthy pink moist vaginal mucosa with rugae.  Smallest available speculum used based on patient preference with moderate discomfort during the pelvic exam.  Mild leukorrhea noted on  exam, samples taken.  Bimanual exam performed, difficulty palpating the uterus but no pain noted with motion of the cervix or palpation of adnexa.  No adnexal masses appreciated.  Results for orders placed or performed in visit on 02/17/19 (from the past 72 hour(s))  POCT Wet Prep [facility] [place])     Status: Abnormal   Collection Time: 02/17/19  4:35 PM  Result Value Ref Range   Source Wet Prep POC VAG    WBC, Wet Prep HPF POC 1-5    Bacteria Wet Prep HPF POC Many (A) Few   Clue Cells Wet Prep HPF POC Moderate (A) None   Clue Cells Wet Prep Whiff POC Positive Whiff    Yeast Wet Prep HPF POC None None   KOH Wet Prep POC None None   Trichomonas Wet Prep HPF POC Absent Absent     Assessment/Plan:  Bacterial vaginosis Noted on wet prep. -Metronidazole, vaginal applicator for 5 days -Follow-up gonorrhea/chlamydia -Blood work declined (HIV, RPR)  Vaginal discomfort Unclear etiology for vaginal discomfort at this time.  There certainly may be a contribution from her bacterial vaginosis, will treat as noted.  I do not suspect that treating her BV will resolve vulvovaginal discomfort.  The differential includes atrophic vaginitis, vaginal sclerosis, lichen planus, hypersensitivity reaction.  Physical exam makes atrophic vaginitis, sclerosis and lichen planus less likely.  She has noted significant sensitivity to lubricants in the past and the above-noted vaginal dryness may be a hypersensitivity reaction.  For now, plan to treat BV as noted and encouraged use of lubricants for comfort as needed.  We will also have her fill out a release of information to obtain records from [facility].

## 2019-02-17 NOTE — Patient Instructions (Addendum)
I'm sorry that you have been having this vaginal discomfort.  Based on her exam today, does not look like you are experiencing significant atrophic vaginitis which is related to an estrogen deficiency.  I do not think that you would benefit from vaginal estrogen at this time.  I would recommend using nonhormonal lubricants for comfort as needed.  It does look like you are positive for bacterial vaginosis, a common vaginal infection, not an STI.  The treatment for bacterial vaginosis is a 7-day course of metronidazole.  I have sent that order to your pharmacy.

## 2019-02-17 NOTE — Assessment & Plan Note (Signed)
Noted on wet prep. -Metronidazole, vaginal applicator for 5 days -Follow-up gonorrhea/chlamydia -Blood work declined (HIV, RPR)

## 2019-02-21 ENCOUNTER — Encounter: Payer: Self-pay | Admitting: Family Medicine

## 2019-02-21 LAB — CERVICOVAGINAL ANCILLARY ONLY
Chlamydia: NEGATIVE
Comment: NEGATIVE
Comment: NEGATIVE
Comment: NORMAL
Neisseria Gonorrhea: NEGATIVE
Trichomonas: NEGATIVE

## 2019-03-14 ENCOUNTER — Telehealth: Payer: Self-pay | Admitting: Family Medicine

## 2019-03-14 NOTE — Telephone Encounter (Signed)
Pt is calling and would like for Dr. Kris Mouton to give her a call. Pt said it was a Air traffic controller and would prefer to speak with him.  The best call back is [phone]

## 2019-04-15 ENCOUNTER — Other Ambulatory Visit: Payer: Self-pay

## 2019-04-15 ENCOUNTER — Ambulatory Visit (INDEPENDENT_AMBULATORY_CARE_PROVIDER_SITE_OTHER): Payer: PRIVATE HEALTH INSURANCE | Admitting: Family Medicine

## 2019-04-15 VITALS — BP 124/80 | HR 98 | Wt 249.8 lb

## 2019-04-15 DIAGNOSIS — N840 Polyp of corpus uteri: Secondary | ICD-10-CM | POA: Diagnosis not present

## 2019-04-15 DIAGNOSIS — N898 Other specified noninflammatory disorders of vagina: Secondary | ICD-10-CM

## 2019-04-15 DIAGNOSIS — N949 Unspecified condition associated with female genital organs and menstrual cycle: Secondary | ICD-10-CM

## 2019-04-15 LAB — POCT WET PREP (WET MOUNT)
Clue Cells Wet Prep Whiff POC: POSITIVE
Trichomonas Wet Prep HPF POC: ABSENT

## 2019-04-15 MED ORDER — METRONIDAZOLE 0.75 % EX GEL: 1.0000 "application " | Freq: Two times a day (BID) | CUTANEOUS | 0 refills | Status: AC

## 2019-04-15 MED ORDER — PRASTERONE 6.5 MG VA INST: 1.0000 | VAGINAL_INSERT | Freq: Every day | VAGINAL | 0 refills | Status: DC

## 2019-04-15 NOTE — Patient Instructions (Addendum)
It was great seeing you today!  We performed a wet prep which showed findings consistent with bacterial vaginosis.  We are going to treat this with Flagyl gel.  I believe the vaginal insert you were getting is called prasterone.  I gave you a short course of this to give you enough time to get into see OB/GYN.  At your request I placed a referral for a practice that was [facility].  They should be in contact with you in a a week or 2 to get you set up an appointment.

## 2019-04-18 ENCOUNTER — Telehealth: Payer: Self-pay | Admitting: Family Medicine

## 2019-04-18 ENCOUNTER — Telehealth: Payer: Self-pay

## 2019-04-18 MED ORDER — METRONIDAZOLE 0.75 % VA GEL: 1.0000 | Freq: Every day | VAGINAL | 0 refills | Status: DC

## 2019-04-18 NOTE — Telephone Encounter (Signed)
Changed met[facility]  Claudia Dawn MD PGY-3 Family Medicine Resident

## 2019-04-18 NOTE — Telephone Encounter (Signed)
Pt is calling because she was seen on Friday and asked the doctor to send one medication to [facility] and the other to the [facility]. Can we send the Metrogel to the [facility] on [address] right away since she was suppose to have this on Friday

## 2019-04-18 NOTE — Telephone Encounter (Signed)
Pt calling to ask Dr. Edythe Clarity send Metrogel to [facility] and send the Prasterone to [facility]. Pt said she called [facility] and they don't have either Rx.  I called [facility] and confirmed they don't have anything for pt. [facility] didn't receive anything either. Ottis Stain, CMA

## 2019-04-18 NOTE — Telephone Encounter (Signed)
Poke to patient and she confirms that she wants metronidazole sent to [facility] on [address].  Passed information to Dr. Kris Mouton who will resend.  Ozella Almond, [place]

## 2019-04-18 NOTE — Addendum Note (Signed)
Addended by: Pauletta Browns on: 04/18/2019 05:16 PM   Modules accepted: Orders

## 2019-04-19 ENCOUNTER — Telehealth: Payer: Self-pay | Admitting: *Deleted

## 2019-04-19 NOTE — Telephone Encounter (Signed)
Called in script for 30 day supply of vit e suppository 200IU to [facility].  Guadalupe Dawn MD PGY-3 Family Medicine Resident

## 2019-04-19 NOTE — Telephone Encounter (Signed)
Rx request for vitamin e vaginal MBK (LSM) 200IU suppository. Its from "[facility]" [address] Phone [phone], Fax [phone]. Cant find it in the system. Please advise. Caira Poche Kennon Holter, CMA

## 2019-04-20 ENCOUNTER — Encounter: Payer: Self-pay | Admitting: Family Medicine

## 2019-04-20 DIAGNOSIS — N840 Polyp of corpus uteri: Secondary | ICD-10-CM | POA: Insufficient documentation

## 2019-04-20 NOTE — Assessment & Plan Note (Signed)
Sent in script for vitamin E suppository at patient request. Not on epic formulary so had to send via phone to speciality pharmacy.

## 2019-04-20 NOTE — Assessment & Plan Note (Signed)
Requests referral to ob/gyn for uterine polyp.

## 2019-04-20 NOTE — Progress Notes (Signed)
   HPI 44 year old female who presents for vaginal itching, discharge.  Patient was treated for bacterial vaginosis with metronidazole gel back on 02/17/2019.  Patient to stop taking the metronidazole gel for unclear reasons.  States that the same symptoms came back 3 to 4 days ago.  Patient is also asking for referral to gynecology for surveillance of a uterine polyp. She is also requesting a prescription for a vitamin E vaginal suppository to assist with vaginal lubrication.  CC: vaginal itching, discharge  ROS:   Review of Systems See HPI for ROS.   CC, SH/smoking status, and VS noted  Objective: BP 124/80   Pulse 98   Wt 249 lb 12.8 oz (113.3 kg)   SpO2 98%   BMI 44.25 kg/m  Gen: pleasant 44 year old female, no acute distress, comfortable CV: skin warm and dry Resp: no increased work of breathing GU: cervix easily visualized, white discharge Neuro: Alert and oriented, Speech clear, No gross deficits   Assessment and plan:  Vaginal discomfort Positive for bv. Gave script for metrogel.  Uterine polyp Requests referral to ob/gyn for uterine polyp.  Vaginal dryness Sent in script for vitamin E suppository at patient request. Not on epic formulary so had to send via phone to speciality pharmacy.   Orders Placed This Encounter  Procedures  . Ambulatory referral to Obstetrics / Gynecology    Referral Priority:   Routine    Referral Type:   Consultation    Referral Reason:   Patient Preference    Requested Specialty:   Obstetrics and Gynecology    Number of Visits Requested:   1  . POCT Wet Prep [facility])    Meds ordered this encounter  Medications  . DISCONTD: Prasterone 6.5 MG INST    Sig: Place 1 each vaginally daily.    Dispense:  30 each    Refill:  0  . DISCONTD: Prasterone 6.5 MG INST    Sig: Place 1 each vaginally daily.    Dispense:  30 each    Refill:  0  . metroNIDAZOLE (METROGEL) 0.75 % gel    Sig: Apply 1 application topically 2 (two) times  daily for 10 days.    Dispense:  20 g    Refill:  0   Guadalupe Dawn MD PGY-3 Family Medicine Resident  04/20/2019 12:47 PM

## 2019-04-20 NOTE — Assessment & Plan Note (Signed)
Positive for bv. Gave script for metrogel.

## 2019-05-02 ENCOUNTER — Ambulatory Visit: Payer: Self-pay | Admitting: Podiatry

## 2019-05-04 ENCOUNTER — Ambulatory Visit: Payer: PRIVATE HEALTH INSURANCE | Admitting: Podiatry

## 2019-05-18 ENCOUNTER — Encounter: Payer: Self-pay | Admitting: Podiatry

## 2019-05-18 ENCOUNTER — Ambulatory Visit (INDEPENDENT_AMBULATORY_CARE_PROVIDER_SITE_OTHER): Payer: PRIVATE HEALTH INSURANCE | Admitting: Podiatry

## 2019-05-18 ENCOUNTER — Other Ambulatory Visit: Payer: Self-pay

## 2019-05-18 DIAGNOSIS — L600 Ingrowing nail: Secondary | ICD-10-CM

## 2019-05-18 MED ORDER — GENTAMICIN SULFATE 0.1 % EX CREA: 1.0000 "application " | TOPICAL_CREAM | Freq: Two times a day (BID) | CUTANEOUS | 1 refills | Status: DC

## 2019-05-18 MED ORDER — TERBINAFINE HCL 250 MG PO TABS: 250.0000 mg | ORAL_TABLET | Freq: Every day | ORAL | 2 refills | Status: DC

## 2019-05-18 NOTE — Patient Instructions (Signed)

## 2019-05-22 NOTE — Progress Notes (Signed)
   Subjective: Patient presents today for evaluation of intermittent soreness to the medial border of the right great toe that began a few years ago. Patient is concerned for possible ingrown nail. Applying pressure to the toe increases the pain. She has not had any treatment for the symptoms. She also notes discoloration and thickening of nails 1-5 bilaterally that have been that way for the past few years as well. She denies any modifying factors and has not had any treatment. Patient presents today for further treatment and evaluation.  Past Medical History:  Diagnosis Date  . Alcohol abuse   . Anxiety   . Back pain   . Breast abscess   . Constipation   . Depression   . Gallbladder problem   . GERD (gastroesophageal reflux disease)   . HTN (hypertension)   . Hypertension   . Irregular periods/menstrual cycles   . Joint pain   . Lactose intolerance   . Leg edema   . Mastitis   . Obesity   . Obesity   . PCOS (polycystic ovarian syndrome)   . PTSD (post-traumatic stress disorder)   . Swallowing difficulty   . Tobacco abuse   . Tobacco abuse     Objective:  General: Well developed, nourished, in no acute distress, alert and oriented x3   Dermatology: Skin is warm, dry and supple bilateral. Medial border of the right hallux appears to be erythematous with evidence of an ingrowing nail. Pain on palpation noted to the border of the nail fold. Hyperkeratotic, discolored, thickened, onychodystrophy noted to nails 1-5 bilaterally. There are no open sores, lesions.  Vascular: Dorsalis Pedis artery and Posterior Tibial artery pedal pulses palpable. No lower extremity edema noted.   Neruologic: Grossly intact via light touch bilateral.  Musculoskeletal: Muscular strength within normal limits in all groups bilateral. Normal range of motion noted to all pedal and ankle joints.   Assesement: #1 Paronychia with ingrowing nail medial border right hallux  #2 Pain in toe #3 Incurvated  nail #4 Onychomycosis nails 1-5 bilateral   Plan of Care:  1. Patient evaluated.  2. Discussed treatment alternatives and plan of care. Explained nail avulsion procedure and post procedure course to patient. 3. Patient opted for permanent partial nail avulsion of the medial border right hallux.  4. Prior to procedure, local anesthesia infiltration utilized using 3 ml of a 50:50 mixture of 2% plain lidocaine and 0.5% plain marcaine in a normal hallux block fashion and a betadine prep performed.  5. Partial permanent nail avulsion with chemical matrixectomy performed using [id number] applications of phenol followed by alcohol flush.  6. Light dressing applied. 7. Prescription for Gentamicin cream provided to patient to use daily with a bandage.  8. Prescription for Lamisil 250 mg #90 provided to patient.  9. Inquired about liver function and patient declines any known history of issue or problems.  10. Return to clinic in 2 weeks.  Edrick Kins, DPM [facility]  Dr. Edrick Kins, [address]                                        [place], [place] [address]                Office [phone]  Fax [phone]

## 2019-06-01 ENCOUNTER — Encounter: Payer: Self-pay | Admitting: Podiatry

## 2019-06-01 ENCOUNTER — Ambulatory Visit (INDEPENDENT_AMBULATORY_CARE_PROVIDER_SITE_OTHER): Payer: PRIVATE HEALTH INSURANCE | Admitting: Podiatry

## 2019-06-01 ENCOUNTER — Other Ambulatory Visit: Payer: Self-pay

## 2019-06-01 DIAGNOSIS — L600 Ingrowing nail: Secondary | ICD-10-CM | POA: Diagnosis not present

## 2019-06-03 ENCOUNTER — Encounter: Payer: PRIVATE HEALTH INSURANCE | Admitting: Obstetrics and Gynecology

## 2019-06-04 NOTE — Progress Notes (Signed)
   Subjective: 44 y.o. female presents today status post permanent nail avulsion procedure of the medial border of the right great toe that was performed on 05/18/2019. She states she is doing well. She reports the nail is improving. She reports some continued darkness of the nail but denies any pain or modifying factors. She continues to take Lamisil and use Gentamicin cream as directed. Patient is here for further evaluation and treatment.    Past Medical History:  Diagnosis Date  . Alcohol abuse   . Anxiety   . Back pain   . Breast abscess   . Constipation   . Depression   . Gallbladder problem   . GERD (gastroesophageal reflux disease)   . HTN (hypertension)   . Hypertension   . Irregular periods/menstrual cycles   . Joint pain   . Lactose intolerance   . Leg edema   . Mastitis   . Obesity   . Obesity   . PCOS (polycystic ovarian syndrome)   . PTSD (post-traumatic stress disorder)   . Swallowing difficulty   . Tobacco abuse   . Tobacco abuse     Objective: Skin is warm, dry and supple. Nail and respective nail fold appears to be healing appropriately. Open wound to the associated nail fold with a granular wound base and moderate amount of fibrotic tissue. Minimal drainage noted. Mild erythema around the periungual region likely due to phenol chemical matricectomy.  Assessment: #1 postop permanent partial nail avulsion medial border right great toe #2 open wound periungual nail fold of respective digit.   Plan of care: #1 patient was evaluated  #2 debridement of open wound was performed to the periungual border of the respective toe using a currette. Antibiotic ointment and Band-Aid was applied. #3 patient is to return to clinic on a PRN basis.   Edrick Kins, DPM [facility]  Dr. Edrick Kins, [address]                                        [place], [place] [address]                Office [phone]  Fax [phone]

## 2019-06-09 ENCOUNTER — Ambulatory Visit (INDEPENDENT_AMBULATORY_CARE_PROVIDER_SITE_OTHER): Payer: 59 | Admitting: Obstetrics and Gynecology

## 2019-06-09 ENCOUNTER — Encounter: Payer: Self-pay | Admitting: Obstetrics and Gynecology

## 2019-06-09 ENCOUNTER — Other Ambulatory Visit: Payer: Self-pay

## 2019-06-09 VITALS — BP 139/88 | HR 84 | Wt 249.3 lb

## 2019-06-09 DIAGNOSIS — N760 Acute vaginitis: Secondary | ICD-10-CM

## 2019-06-09 DIAGNOSIS — Z809 Family history of malignant neoplasm, unspecified: Secondary | ICD-10-CM

## 2019-06-09 DIAGNOSIS — Z113 Encounter for screening for infections with a predominantly sexual mode of transmission: Secondary | ICD-10-CM

## 2019-06-09 DIAGNOSIS — N879 Dysplasia of cervix uteri, unspecified: Secondary | ICD-10-CM | POA: Insufficient documentation

## 2019-06-09 DIAGNOSIS — B373 Candidiasis of vulva and vagina: Secondary | ICD-10-CM | POA: Diagnosis not present

## 2019-06-09 DIAGNOSIS — B9689 Other specified bacterial agents as the cause of diseases classified elsewhere: Secondary | ICD-10-CM

## 2019-06-09 DIAGNOSIS — Z1151 Encounter for screening for human papillomavirus (HPV): Secondary | ICD-10-CM | POA: Diagnosis not present

## 2019-06-09 DIAGNOSIS — Z124 Encounter for screening for malignant neoplasm of cervix: Secondary | ICD-10-CM | POA: Diagnosis not present

## 2019-06-09 DIAGNOSIS — Z01419 Encounter for gynecological examination (general) (routine) without abnormal findings: Secondary | ICD-10-CM | POA: Diagnosis not present

## 2019-06-09 DIAGNOSIS — B3731 Acute candidiasis of vulva and vagina: Secondary | ICD-10-CM

## 2019-06-09 MED ORDER — FLUCONAZOLE 150 MG PO TABS: ORAL_TABLET | ORAL | 0 refills | Status: DC

## 2019-06-09 MED ORDER — METRONIDAZOLE 500 MG PO TABS: 500.0000 mg | ORAL_TABLET | Freq: Two times a day (BID) | ORAL | 1 refills | Status: DC

## 2019-06-09 NOTE — Progress Notes (Signed)
Obstetrics and Gynecology New Patient Evaluation  Appointment Date: 06/09/2019  OBGYN Clinic: [facility]  Primary Care Provider: Guadalupe Wall  Referring Provider: Lind Wall, *  Chief Complaint:  Chief Complaint  Patient presents with  . Gynecologic Exam    History of Present Illness: Claudia Wall is a 44 y.o. African-American G2P2 (Patient's last menstrual period was 06/03/2019.), seen for the above chief complaint. Her past medical history is significant for HTN, tobacco abuse, BMI 82s, h/o uterine polyps, ?PCOS, h/o AUB, +HPV paps  Period just ended   Review of Systems: Pertinent items are noted in HPI.   Patient Active Problem List   Diagnosis Date Noted  . Cervical dysplasia 06/09/2019  . Uterine polyp 04/20/2019  . Vaginal discomfort 02/17/2019  . Constipation 01/08/2019  . Chronic bilateral low back pain without sciatica 09/23/2018  . Obesity, Class III, BMI 40-49.9 (morbid obesity) ([place]) 09/23/2018  . Pelvic pain 07/18/2018  . Headache 04/14/2018  . Bilateral carpal tunnel syndrome 02/09/2018  . Acute lumbar myofascial strain 12/29/2017  . Chronic post-traumatic stress disorder (PTSD) 12/29/2017  . Morbid obesity ([facility]) 11/13/2017  . Insomnia due to stress 11/09/2017  . Chronic toe pain, left foot 11/09/2017  . RLQ abdominal pain 07/13/2017  . Other hyperlipidemia 05/13/2017  . Vitamin D deficiency 05/13/2017  . Prediabetes 05/13/2017  . Shortness of breath on exertion 04/29/2017  . Enlarged thyroid 04/29/2017  . ETOHism ([place]) 04/29/2017  . PCOS (polycystic ovarian syndrome) 04/29/2017  . Irregular periods/menstrual cycles 02/12/2017  . Vaginal dryness 02/12/2017  . Left carpal tunnel syndrome 07/08/2016  . Lumbago with sciatica, left side 03/03/2016  . Carpal tunnel syndrome, right upper limb 07/07/2013  . Breast pain, left 05/12/2012  . Dyshidrotic eczema 11/06/2008  . Tobacco use disorder 11/01/2008  . Body  mass index 40.0-44.9, adult ([place]) 02/03/2007  . Primary hypertension 05/28/2006  . GERD (gastroesophageal reflux disease) 05/28/2006    Past Medical History:  Past Medical History:  Diagnosis Date  . Alcohol abuse   . Anxiety   . Back pain   . Breast abscess   . Constipation   . Depression   . Gallbladder problem   . GERD (gastroesophageal reflux disease)   . HTN (hypertension)   . Hypertension   . Irregular periods/menstrual cycles   . Joint pain   . Lactose intolerance   . Leg edema   . Mastitis   . Mastitis    h/o right  . Obesity   . Obesity   . PCOS (polycystic ovarian syndrome)   . PTSD (post-traumatic stress disorder)   . Swallowing difficulty   . Tobacco abuse     Past Surgical History:  Past Surgical History:  Procedure Laterality Date  . CHOLECYSTECTOMY  2002  . INCISION AND DRAINAGE BREAST ABSCESS      Past Obstetrical History:  OB History  Gravida Para Term Preterm AB Living  2         2  SAB TAB Ectopic Multiple Live Births               # Outcome Date GA Lbr Len/2nd Weight Sex Delivery Anes PTL Lv  2 Gravida           1 Gravida             Obstetric Comments  Vag delivery x 2    Past Gynecological History: As per HPI. Periods: LMP 3/5 and just ended, lasts for about 3-5 days and can  be heavy and painful in the first few days, last period was one month ago, sometimes she skips a month, she has no intermenstrual bleeding.  History of Pap Smear(s): Yes.   Last pap 2019, which was NILM/HPV+/16-18-45 negtive She is currently using no method for contraception. Patient has been in same sex relationship for past 20 years  Social History:  Social History   Socioeconomic History  . Marital status: Significant Other    Spouse name: Claudia Wall  . Number of children: Not on file  . Years of education: Not on file  . Highest education level: Not on file  Occupational History  . Not on file  Tobacco Use  . Smoking status: Light Tobacco Smoker     Packs/day: 0.25    Years: 25.00    Pack years: 6.25    Types: Cigarettes  . Smokeless tobacco: Never Used  . Tobacco comment: "used to smoke 1/2 to 1ppd depending on stress"  Substance and Sexual Activity  . Alcohol use: Yes    Alcohol/week: 3.0 standard drinks    Types: 3 Glasses of wine per week    Comment: occassional  . Drug use: No  . Sexual activity: Yes    Partners: Female    Birth control/protection: Other-see comments, None    Comment: for past 21 years  Other Topics Concern  . Not on file  Social History Narrative   Is in a same sex relationship x 1 year. Very supportive and loving per pt and partner.    Social Determinants of Health   Financial Resource Strain:   . Difficulty of Paying Living Expenses:   Food Insecurity:   . Worried About Charity fundraiser in the Last Year:   . Arboriculturist in the Last Year:   Transportation Needs:   . Film/video editor (Medical):   Marland Kitchen Lack of Transportation (Non-Medical):   Physical Activity:   . Days of Exercise per Week:   . Minutes of Exercise per Session:   Stress:   . Feeling of Stress :   Social Connections:   . Frequency of Communication with Friends and Family:   . Frequency of Social Gatherings with Friends and Family:   . Attends Religious Services:   . Active Member of Clubs or Organizations:   . Attends Archivist Meetings:   Marland Kitchen Marital Status:   Intimate Partner Violence:   . Fear of Current or Ex-Partner:   . Emotionally Abused:   Marland Kitchen Physically Abused:   . Sexually Abused:     Family History:  Family History  Problem Relation Age of Onset  . Cancer Mother   . Hypertension Mother   . Heart disease Mother   . Depression Mother   . Bipolar disorder Mother   . Schizophrenia Mother   . Alcoholism Mother   . Drug abuse Mother   . Obesity Mother   . Hypertension Father   . Diabetes Father    She denies any female cancers  Health Maintenance:  Mammogram(s): Yes.   Date:  2018  Medications Claudia Wall. Claudia Wall had no medications administered during this visit. Current Outpatient Medications  Medication Sig Dispense Refill  . diphenhydrAMINE (BENADRYL) 25 mg capsule Take 25 mg by mouth at bedtime as needed.     Marland Kitchen gentamicin cream (GARAMYCIN) 0.1 % Apply 1 application topically 2 (two) times daily. 15 g 1  . hydrochlorothiazide (HYDRODIURIL) 25 MG tablet Take 1 tablet (25 mg total) by mouth  daily. 90 tablet 3  . meloxicam (MOBIC) 15 MG tablet Take 1 tablet (15 mg total) by mouth daily. 30 tablet 0  . Cholecalciferol (VITAMIN D) 50 MCG (2000 UT) CAPS Take one pill daily (Patient not taking: Reported on 06/09/2019) 30 capsule 11  . FLUoxetine (PROZAC) 20 MG tablet Take 1 tablet (20 mg total) by mouth daily. (Patient not taking: Reported on 06/09/2019) 90 tablet 3  . gabapentin (NEURONTIN) 100 MG capsule Take 1 capsule (100 mg total) by mouth 3 (three) times daily. (Patient not taking: Reported on 06/09/2019) 30 capsule 3  . metFORMIN (GLUCOPHAGE) 500 MG tablet Take 1 tablet (500 mg total) by mouth daily with breakfast. (Patient not taking: Reported on 06/09/2019) 30 tablet 0  . methocarbamol (ROBAXIN) 500 MG tablet Take 1 tablet (500 mg total) by mouth at bedtime. (Patient not taking: Reported on 06/09/2019) 5 tablet 0  . terbinafine (LAMISIL) 250 MG tablet Take 1 tablet (250 mg total) by mouth daily. (Patient not taking: Reported on 06/09/2019) 30 tablet 2  . traMADol (ULTRAM) 50 MG tablet Take 1-2 tabs po q6-8 hours prn pain (Patient not taking: Reported on 06/09/2019) 30 tablet 1  . Vitamin D, Ergocalciferol, (DRISDOL) 1.25 MG (50000 UT) CAPS capsule Take 1 capsule (50,000 Units total) by mouth every 7 (seven) days. (Patient not taking: Reported on 06/09/2019) 4 capsule 0   No current facility-administered medications for this visit.    Allergies Hydrocodone and Tylenol [acetaminophen]   Physical Exam:  BP 139/88   Pulse 84   Wt 249 lb 4.8 oz (113.1 kg)   LMP  06/03/2019   BMI 44.16 kg/m  Body mass index is 44.16 kg/m.  General appearance: Well nourished, well developed female in no acute distress.  Neck:  Supple, normal appearance, and no thyromegaly  Cardiovascular: normal s1 and s2.  No murmurs, rubs or gallops. Respiratory:  Clear to auscultation bilateral. Normal respiratory effort Abdomen: positive bowel sounds and no masses, hernias; diffusely non tender to palpation, non distended Breasts: breasts appear normal, no suspicious masses, no skin or nipple changes or axillary nodes, normal palpation. Neuro/Psych:  Normal mood and affect.  Skin:  Warm and dry.  Lymphatic:  No inguinal lymphadenopathy.   Pelvic exam: is not limited by body habitus EGBUS: within normal limits Vagina: within normal limits and with no blood. +white/yellowish d/c in vault Cervix: normal appearing cervix without tenderness, discharge or lesions.  Uterus:  nonenlarged and non tender Adnexa:  normal adnexa and no mass, fullness, tenderness Rectovaginal: deferred  Laboratory: none  Radiology: none  Assessment: pt dong well  Plan:  1. GYN Annual Patient has had normal FSH/estradiol and TSH in the past two years and she had a negative embx that showed a polyp back [facility] They recommended hysteroscopy, d&c at that time.   2. History of uterine polyps I told her that based on her history that she does need to have a regular monthly period to decrease of AUB and hyperplasia. I told her that if she wanted to do hormone management (progestin only pills, cyclic provera, depo provera) then that would be fine or if she wanted to pursue surgery then I would recommend hysteroscopy, dilation and curettage with or without an endometrial ablation. R/B for these d/w her and pt to consider options. 10 minutes  3. Yeast, BV Diflucan and flagyl. Pt states she doesn't like flagyl; I offered her alternative but pt said she's okay with the flagyl. 2  minutes  4. H/o abnormal paps Repeat pap and hpv. 2 minutes  RTC PRN  Durene Romans MD Attending [facility])

## 2019-06-10 ENCOUNTER — Encounter: Payer: Self-pay | Admitting: Obstetrics and Gynecology

## 2019-06-13 LAB — CYTOLOGY - PAP
Chlamydia: NEGATIVE
Comment: NEGATIVE
Comment: NEGATIVE
Comment: NEGATIVE
Comment: NORMAL
Diagnosis: NEGATIVE
High risk HPV: NEGATIVE
Neisseria Gonorrhea: NEGATIVE
Trichomonas: NEGATIVE

## 2019-06-16 ENCOUNTER — Encounter: Payer: Self-pay | Admitting: Lactation Services

## 2019-09-22 ENCOUNTER — Ambulatory Visit (INDEPENDENT_AMBULATORY_CARE_PROVIDER_SITE_OTHER): Payer: Self-pay | Admitting: Family Medicine

## 2019-09-22 ENCOUNTER — Other Ambulatory Visit: Payer: Self-pay

## 2019-09-22 ENCOUNTER — Encounter: Payer: Self-pay | Admitting: Family Medicine

## 2019-09-22 VITALS — BP 140/76 | HR 89 | Ht 63.0 in | Wt 255.5 lb

## 2019-09-22 DIAGNOSIS — M5442 Lumbago with sciatica, left side: Secondary | ICD-10-CM

## 2019-09-22 DIAGNOSIS — G8929 Other chronic pain: Secondary | ICD-10-CM

## 2019-09-22 DIAGNOSIS — Z6841 Body Mass Index (BMI) 40.0 and over, adult: Secondary | ICD-10-CM

## 2019-09-22 DIAGNOSIS — E559 Vitamin D deficiency, unspecified: Secondary | ICD-10-CM

## 2019-09-22 DIAGNOSIS — M545 Low back pain: Secondary | ICD-10-CM

## 2019-09-22 MED ORDER — MELOXICAM 15 MG PO TABS: 15.0000 mg | ORAL_TABLET | Freq: Every day | ORAL | 0 refills | Status: DC

## 2019-09-22 MED ORDER — GABAPENTIN 100 MG PO CAPS: 100.0000 mg | ORAL_CAPSULE | Freq: Three times a day (TID) | ORAL | 0 refills | Status: DC

## 2019-09-22 NOTE — Patient Instructions (Addendum)
It was great seeing you again today!  I am sorry that you are having issues with your back, I refilled your meloxicam and gabapentin.  We will get a vitamin D level to recheck that.  On top of that I think in order to stop the palpitations after drinking alcohol, I would to stop the alcohol.  Lastly I gave you a card for our nutritionist, Dr. Jenne Campus.  Please call the number on there to schedule an appointment.  I think she will be greatly beneficial for you.

## 2019-09-23 ENCOUNTER — Encounter: Payer: Self-pay | Admitting: Family Medicine

## 2019-09-23 LAB — VITAMIN D 25 HYDROXY (VIT D DEFICIENCY, FRACTURES): Vit D, 25-Hydroxy: 24 ng/mL — ABNORMAL LOW (ref 30.0–100.0)

## 2019-09-23 NOTE — Assessment & Plan Note (Signed)
We will draw vitamin D,25-oh level to further evaluate.  Would likely benefit from supplementation if low.  Likely refill her vitamin D 50 mcg unit tablets if low.

## 2019-09-23 NOTE — Assessment & Plan Note (Addendum)
BMI 45.26 today.  I recommended the patient make an appointment with our nutritionist, Dr. Jenne Campus for further management.  She states that she has difficulty controlling her cravings for bad foods and cravings to eat when she is not hungry.  She has expressed interest in weight loss surgery in the past but states her insurance would not cover it.  With a BMI above 40 and hypertension she does meet indication for weight loss surgery, can reevaluate this down the road if she would like.

## 2019-09-23 NOTE — Assessment & Plan Note (Signed)
Has been flaring up on her more recently.  Patient understands that this is likely due to her weight.  States that meloxicam and gabapentin worked very well for her in the past.  Refilled these medications.  Reviewed alarm symptoms with patient, and were negative

## 2019-09-23 NOTE — Progress Notes (Signed)
° °  CHIEF COMPLAINT / HPI: 44 year old female presenting for back pain/vitamin D level/weight loss counseling.  Patient states that her chronic back pain has been acting up recently.  Is previously been well controlled with meloxicam and gabapentin, but she is out of those medications.  She is having no alarm symptoms such as urinary/fecal incontinence, neurologic deficits, numbness and tingling down legs.  Is described as lumbar back pain which is worse when she flexes or extends her back.  She is interested in getting her vitamin D level rechecked as it has been low in the past.  She has been on vitamin D supplementation in the past.  Lastly she is interested in weight loss counseling.  She states that she has been interested in bariatric surgery but her insurance has denied this.  Her BMI is currently 45 and she has hypertension.  She states that she has a lot of difficulty fighting cravings to eat junk food, as well as to not eat when she is not hungry.  PERTINENT  PMH / PSH: None   OBJECTIVE: BP 140/76    Pulse 89    Ht 5\' 3"  (1.6 m)    Wt 255 lb 8 oz (115.9 kg)    LMP 08/20/2019    SpO2 98%    BMI 45.26 kg/m   Gen: 44 year old Afro-American female, no acute distress, very pleasant Resp: No accessory muscle use, no respiratory distress Back: Tenderness in lower back with flexion and extension in areas of paraspinal muscles. Neuro: Alert and oriented, Speech clear, No gross deficits   ASSESSMENT / PLAN:  Chronic bilateral low back pain without sciatica Has been flaring up on her more recently.  Patient understands that this is likely due to her weight.  States that meloxicam and gabapentin worked very well for her in the past.  Refilled these medications.  Reviewed alarm symptoms with patient, and were negative  BMI 45.0-49.9, adult ([facility]) BMI 45.26 today.  I recommended the patient make an appointment with our nutritionist, Dr. Jenne Campus for further management.  She states that she has  difficulty controlling her cravings for bad foods and cravings to eat when she is not hungry.  She has expressed interest in weight loss surgery in the past but states her insurance would not cover it.  With a BMI above 40 and hypertension she does meet indication for weight loss surgery, can reevaluate this down the road if she would like.  Vitamin D deficiency We will draw vitamin D,25-oh level to further evaluate.  Would likely benefit from supplementation if low.  Likely refill her vitamin D 50 mcg unit tablets if low.     Guadalupe Dawn, MD [place]

## 2019-09-23 NOTE — Assessment & Plan Note (Deleted)
Has been flaring up on her more recently.  Patient understands that this is likely due to her weight.  States that meloxicam and gabapentin worked very well for her in the past.  Refilled these medications.  Reviewed alarm symptoms with patient, and were negative.

## 2019-09-26 ENCOUNTER — Other Ambulatory Visit: Payer: Self-pay | Admitting: Family Medicine

## 2019-09-26 ENCOUNTER — Telehealth: Payer: Self-pay | Admitting: *Deleted

## 2019-09-26 MED ORDER — VITAMIN D 50 MCG (2000 UT) PO CAPS: ORAL_CAPSULE | ORAL | 0 refills | Status: DC

## 2019-09-26 NOTE — Telephone Encounter (Signed)
Pt informed. Sable Knoles, CMA  

## 2019-09-26 NOTE — Telephone Encounter (Signed)
-----   Message from Guadalupe Dawn, MD sent at 09/26/2019  2:17 PM EDT ----- As suspected the patient's vitamin D,25 OH level was still low. Will refill cholecalficerol 9mcg caps.  Guadalupe Dawn MD PGY-3 Family Medicine Resident

## 2019-12-23 ENCOUNTER — Ambulatory Visit (INDEPENDENT_AMBULATORY_CARE_PROVIDER_SITE_OTHER): Payer: Self-pay | Admitting: Family Medicine

## 2019-12-23 ENCOUNTER — Other Ambulatory Visit: Payer: Self-pay

## 2019-12-23 VITALS — BP 138/80 | HR 83 | Ht 63.0 in | Wt 257.8 lb

## 2019-12-23 DIAGNOSIS — R7303 Prediabetes: Secondary | ICD-10-CM

## 2019-12-23 DIAGNOSIS — I10 Essential (primary) hypertension: Secondary | ICD-10-CM

## 2019-12-23 DIAGNOSIS — S39012A Strain of muscle, fascia and tendon of lower back, initial encounter: Secondary | ICD-10-CM

## 2019-12-23 DIAGNOSIS — R002 Palpitations: Secondary | ICD-10-CM

## 2019-12-23 LAB — POCT GLYCOSYLATED HEMOGLOBIN (HGB A1C): Hemoglobin A1C: 5.8 % — AB (ref 4.0–5.6)

## 2019-12-23 MED ORDER — METHOCARBAMOL 500 MG PO TABS: 500.0000 mg | ORAL_TABLET | Freq: Every day | ORAL | 0 refills | Status: DC

## 2019-12-23 NOTE — Progress Notes (Signed)
SUBJECTIVE:   CHIEF COMPLAINT / HPI:   Heart Racing: Patient had a recent episode on 9/14 or 9/15 in which she was driving her car, was on the phone at the time and then felt a strong, quick fluttering sensation in her chest.  She had very quick shortness of breath associated with it.  She pulled into a gas station parking lot to remove herself from the road.  She said the whole episode lasted for-5 seconds, felt a little bit lightheaded with some shortness of breath, lost her concentration but did not lose any consciousness.  Denies any nausea, vomiting, chest pain, or abdominal pain associated with the episode.  Reports she has had episodes like this in the past, however describes this mood as being more intense.  Low back pain/right gluteal pain: Patient with significant tenderness to palpation associated with the right low back and right gluteal region.  Denies any shooting pains going down her leg.  Has tenderness to palpation associated over the musculature in this area.  Denies any traumas, falls, injuries.  Denies any loss of sensation or weakness.  Prediabetes: Patient with a history of prediabetes.  Used to be on Metformin, reports she is no longer taking this.    Health Maintenance: due for Hep C screening, Covid shot, tetanus shot, flu vaccine, HbA1c and BMP  PERTINENT  PMH / PSH:  Patient Active Problem List   Diagnosis Date Noted  . Cervical dysplasia 06/09/2019  . Uterine polyp 04/20/2019  . Vaginal discomfort 02/17/2019  . Constipation 01/08/2019  . Chronic bilateral low back pain without sciatica 09/23/2018  . Obesity, Class III, BMI 40-49.9 (morbid obesity) ([place]) 09/23/2018  . Pelvic pain 07/18/2018  . Headache 04/14/2018  . Bilateral carpal tunnel syndrome 02/09/2018  . Acute lumbar myofascial strain 12/29/2017  . Chronic post-traumatic stress disorder (PTSD) 12/29/2017  . Morbid obesity ([place]) 11/13/2017  . Insomnia due to stress 11/09/2017  . Chronic toe pain,  left foot 11/09/2017  . RLQ abdominal pain 07/13/2017  . Other hyperlipidemia 05/13/2017  . Vitamin D deficiency 05/13/2017  . Prediabetes 05/13/2017  . Shortness of breath on exertion 04/29/2017  . Enlarged thyroid 04/29/2017  . ETOHism ([place]) 04/29/2017  . PCOS (polycystic ovarian syndrome) 04/29/2017  . Irregular periods/menstrual cycles 02/12/2017  . Vaginal dryness 02/12/2017  . Left carpal tunnel syndrome 07/08/2016  . Carpal tunnel syndrome, right upper limb 07/07/2013  . Breast pain, left 05/12/2012  . Palpitations 10/27/2011  . Dyshidrotic eczema 11/06/2008  . Tobacco use disorder 11/01/2008  . BMI 45.0-49.9, adult ([place]) 02/03/2007  . Primary hypertension 05/28/2006  . GERD (gastroesophageal reflux disease) 05/28/2006     OBJECTIVE:   BP 138/80   Pulse 83   Ht 5\' 3"  (1.6 m)   Wt 257 lb 12.8 oz (116.9 kg)   LMP 12/06/2019 (Approximate)   SpO2 98%   BMI 45.67 kg/m    Physical exam: General: Well-appearing, pleasant patient Respiratory: CTA bilaterally, comfortable work of breathing Cardio: RRR, S1-S2 present, no murmurs appreciated today, no arrhythmias Back/right hip: Tenderness to palpation appreciated over paravertebral spinal musculature on the right-hand side as well as right gluteus maximus   ASSESSMENT/PLAN:   Prediabetes Patient has a history of prediabetes, was previously prescribed Metformin 500 mg daily, however reports she is no longer taking this medication. -Rechecking HbA1c at today's visit -Recommend restarting Metformin 500 mg for prediabetes and to help with weight loss  Acute lumbar myofascial strain Muscular hypertonicity appreciated on physical exam with  tenderness to palpation, no concerns for neurological deficit on physical exam.  Patient has previously taken Robaxin in the past for muscle spasms, reports it is worked well with her and without significant side effects. -Represcribing methocarbamol 1 tablet at bedtime as needed for  muscular pains  Primary hypertension Blood pressure today 138/80.  Patient prescribed HCTZ 25 mg daily. -Checking BMP at today's visit  Palpitations Patient most likely experiencing benign palpitations such as PAC or PVC.  Patient reassured, however counseled on symptoms that are more worrisome and reasons why she should seek emergent medical care.  Denied any nausea, vomiting, abdominal upset associated with the most recent episode of palpitations. -Patient can to continue to monitor -No need for referral to cardiology at this time, however can consider for the future and do Holter monitor -EKG not performed at today's visit as patient not currently symptomatic     Claudia Wall, [place]

## 2019-12-23 NOTE — Assessment & Plan Note (Signed)
Patient most likely experiencing benign palpitations such as PAC or PVC.  Patient reassured, however counseled on symptoms that are more worrisome and reasons why she should seek emergent medical care.  Denied any nausea, vomiting, abdominal upset associated with the most recent episode of palpitations. -Patient can to continue to monitor -No need for referral to cardiology at this time, however can consider for the future and do Holter monitor -EKG not performed at today's visit as patient not currently symptomatic

## 2019-12-23 NOTE — Patient Instructions (Signed)
Premature Ventricular Contraction  A premature ventricular contraction (PVC) is a common kind of irregular heartbeat (arrhythmia). These contractions are extra heartbeats that start in the ventricles of the heart and occur too early in the normal sequence. During the PVC, the heart's normal electrical pathway is not used, so the beat is shorter and less effective. In most cases, these contractions come and go and do not require treatment. What are the causes? Common causes of the condition include:  Smoking.  Drinking alcohol.  Certain medicines.  Some illegal drugs.  Stress.  Caffeine. Certain medical conditions can also cause PVCs:  Heart failure.  Heart attack, or coronary artery disease.  Heart valve problems.  Changes in minerals in the blood (electrolytes).  Low blood oxygen levels or high carbon dioxide levels. In many cases, the cause of this condition is not known. What are the signs or symptoms? The main symptom of this condition is fast or skipped heartbeats (palpitations). Other symptoms include:  Chest pain.  Shortness of breath.  Feeling tired.  Dizziness.  Difficulty exercising. In some cases, there are no symptoms. How is this diagnosed? This condition may be diagnosed based on:  Your medical history.  A physical exam. During the exam, the health care provider will check for irregular heartbeats.  Tests, such as: ? An ECG (electrocardiogram) to monitor the electrical activity of your heart. ? An ambulatory cardiac monitor. This device records your heartbeats for 24 hours or more. ? Stress tests to see how exercise affects your heart rhythm and blood supply. ? An echocardiogram. This test uses sound waves (ultrasound) to produce an image of your heart. ? An electrophysiology study (EPS). This test checks for electrical problems in your heart. How is this treated? Treatment for this condition depends on any underlying conditions, the type of PVCs  that you are having, and how much the symptoms are interfering with your daily life. Possible treatments include:  Avoiding things that cause premature contractions (triggers). These include caffeine and alcohol.  Taking medicines if symptoms are severe or if the extra heartbeats are frequent.  Getting treatment for underlying conditions that cause PVCs.  Having an implantable cardioverter defibrillator (ICD), if you are at risk for a serious arrhythmia. The ICD is a small device that is inserted into your chest to monitor your heartbeat. When it senses an irregular heartbeat, it sends a shock to bring the heartbeat back to normal.  Having a procedure to destroy the portion of the heart tissue that sends out abnormal signals (catheter ablation). In some cases, no treatment is required. Follow these instructions at home: Lifestyle  Do not use any products that contain nicotine or tobacco, such as cigarettes, e-cigarettes, and chewing tobacco. If you need help quitting, ask your health care provider.  Do not use illegal drugs.  Exercise regularly. Ask your health care provider what type of exercise is safe for you.  Try to get at least 7-9 hours of sleep each night, or as much as recommended by your health care provider.  Find healthy ways to manage stress. Avoid stressful situations when possible. Alcohol use  Do not drink alcohol if: ? Your health care provider tells you not to drink. ? You are pregnant, may be pregnant, or are planning to become pregnant. ? Alcohol triggers your episodes.  If you drink alcohol: ? Limit how much you use to:  0-1 drink a day for women.  0-2 drinks a day for men.  Be aware of how much   alcohol is in your drink. In the [place] one drink equals one 12 oz bottle of beer (355 mL), one 5 oz glass of wine (148 mL), or one 1½ oz glass of hard liquor (44 mL). °General instructions °· Take over-the-counter and prescription medicines only as told by your  health care provider. °· If caffeine triggers episodes of PVC, do not eat, drink, or use anything with caffeine in it. °· Keep all follow-up visits as told by your health care provider. This is important. °Contact a health care provider if you: °· Feel palpitations. °Get help right away if you: °· Have chest pain. °· Have shortness of breath. °· Have sweating for no reason. °· Have nausea and vomiting. °· Become light-headed or you faint. °Summary °· A premature ventricular contraction (PVC) is a common kind of irregular heartbeat (arrhythmia). °· In most cases, these contractions come and go and do not require treatment. °· You may need to wear an ambulatory cardiac monitor. This records your heartbeats for 24 hours or more. °· Treatment depends on any underlying conditions, the type of PVCs that you are having, and how much the symptoms are interfering with your daily life. °This information is not intended to replace advice given to you by your health care provider. Make sure you discuss any questions you have with your health care provider. °Document Revised: 12/10/2017 Document Reviewed: 12/10/2017 °Elsevier Patient Education © 2020 [employer] ° °

## 2019-12-23 NOTE — Assessment & Plan Note (Signed)
Blood pressure today 138/80.  Patient prescribed HCTZ 25 mg daily. -Checking BMP at today's visit

## 2019-12-23 NOTE — Assessment & Plan Note (Signed)
Patient has a history of prediabetes, was previously prescribed Metformin 500 mg daily, however reports she is no longer taking this medication. -Rechecking HbA1c at today's visit -Recommend restarting Metformin 500 mg for prediabetes and to help with weight loss

## 2019-12-23 NOTE — Assessment & Plan Note (Signed)
Muscular hypertonicity appreciated on physical exam with tenderness to palpation, no concerns for neurological deficit on physical exam.  Patient has previously taken Robaxin in the past for muscle spasms, reports it is worked well with her and without significant side effects. -Represcribing methocarbamol 1 tablet at bedtime as needed for muscular pains

## 2020-01-24 ENCOUNTER — Other Ambulatory Visit: Payer: Self-pay

## 2020-01-24 ENCOUNTER — Ambulatory Visit: Payer: Self-pay

## 2020-01-24 DIAGNOSIS — Z20822 Contact with and (suspected) exposure to covid-19: Secondary | ICD-10-CM

## 2020-01-25 LAB — SARS-COV-2, NAA 2 DAY TAT

## 2020-01-25 LAB — NOVEL CORONAVIRUS, NAA: SARS-CoV-2, NAA: NOT DETECTED

## 2020-03-19 ENCOUNTER — Other Ambulatory Visit: Payer: Self-pay | Admitting: *Deleted

## 2020-03-19 MED ORDER — HYDROCHLOROTHIAZIDE 25 MG PO TABS: 25.0000 mg | ORAL_TABLET | Freq: Every day | ORAL | 3 refills | Status: DC

## 2020-04-03 ENCOUNTER — Ambulatory Visit (INDEPENDENT_AMBULATORY_CARE_PROVIDER_SITE_OTHER): Payer: 59

## 2020-04-03 ENCOUNTER — Encounter: Payer: Self-pay | Admitting: Orthopaedic Surgery

## 2020-04-03 ENCOUNTER — Ambulatory Visit (INDEPENDENT_AMBULATORY_CARE_PROVIDER_SITE_OTHER): Payer: 59 | Admitting: Orthopaedic Surgery

## 2020-04-03 DIAGNOSIS — M5442 Lumbago with sciatica, left side: Secondary | ICD-10-CM

## 2020-04-03 DIAGNOSIS — M5441 Lumbago with sciatica, right side: Secondary | ICD-10-CM

## 2020-04-03 DIAGNOSIS — G8929 Other chronic pain: Secondary | ICD-10-CM

## 2020-04-03 DIAGNOSIS — G5601 Carpal tunnel syndrome, right upper limb: Secondary | ICD-10-CM

## 2020-04-03 MED ORDER — PREDNISONE 10 MG (21) PO TBPK: ORAL_TABLET | ORAL | 0 refills | Status: DC

## 2020-04-03 MED ORDER — TIZANIDINE HCL 4 MG PO TABS
4.0000 mg | ORAL_TABLET | Freq: Two times a day (BID) | ORAL | 0 refills | Status: DC | PRN
Start: 2020-04-03 — End: 2020-08-23

## 2020-04-03 NOTE — Progress Notes (Signed)
Office Visit Note   Patient: Claudia Wall           Date of Birth: 11-27-1975           MRN: [id number] Visit Date: 04/03/2020              Requested by: Delora Fuel, MD [address] [place],  [place] [address] PCP: Delora Fuel, MD   Assessment & Plan: Visit Diagnoses:  1. Chronic bilateral low back pain with bilateral sciatica   2. Right carpal tunnel syndrome     Plan: Impression is right carpal tunnel syndrome and chronic lower back pain with bilateral lower extremity radiculopathy.  In regards to carpal tunnel syndrome, she would like a new wrist splint today to wear at night.  We have provided her with this.  We will also schedule her for right carpal tunnel release.  Risk benefits rehab recovery again reviewed with the patient today.  Judeen Hammans will reach out to the patient in the near future to schedule surgery.  In regards to her back, started her on a Sterapred taper and a new muscle relaxer.  Have also made an internal referral for physical therapy.  Call with concerns or questions in meantime.  Follow-Up Instructions: Return for post-op.   Orders:  Orders Placed This Encounter  Procedures  . XR Lumbar Spine 2-3 Views  . Ambulatory referral to Physical Therapy   Meds ordered this encounter  Medications  . predniSONE (STERAPRED UNI-PAK 21 TAB) 10 MG (21) TBPK tablet    Sig: Take as directed    Dispense:  21 tablet    Refill:  0  . tiZANidine (ZANAFLEX) 4 MG tablet    Sig: Take 1 tablet (4 mg total) by mouth 2 (two) times daily as needed for muscle spasms.    Dispense:  20 tablet    Refill:  0      Procedures: No procedures performed   Clinical Data: No additional findings.   Subjective: Chief Complaint  Patient presents with  . Lower Back - Pain  . Right Hand - Numbness    HPI patient is a pleasant 45 year old female who comes in today with chronic lower back pain and bilateral lower extremity radiculopathy as well as continued right carpal  tunnel symptoms.  In regards to her back, she has had pain here for a while.  The pain is primarily to the mid to lower back and radiates down both legs.  No groin pain.  The pain is worse going from a seated to standing position.  She has tried Aleve and Robaxin without relief of symptoms.  She denies any numbness, tingling or burning to the lower extremities.  No bowel or bladder change.  In regards to her right breast, she has paresthesias throughout the median nerve distribution.  Previous nerve conduction study from 2018 showed moderate carpal tunnel syndrome both sides.  She has had the left released and is doing well there.  The symptoms on the right have continued to worsen.  She has had cortisone injections in the past which provide temporary relief.  She is interested in proceeding with carpal tunnel release at this point.  Review of Systems as detailed in HPI.  All others reviewed and are negative.   Objective: Vital Signs: There were no vitals taken for this visit.  Physical Exam well-developed well-nourished female no acute distress.  Alert and oriented x3.  Ortho Exam lumbar exam shows mild to moderate paraspinous tenderness.  She  has increased pain with lumbar flexion and extension.  Minimally positive straight leg raise both sides.  No focal weakness.  She is neurovascular intact distally.  Specialty Comments:  No specialty comments available.  Imaging: XR Lumbar Spine 2-3 Views  Result Date: 04/03/2020 X-rays show mild degenerative changes T12-L1 and L1-L2    PMFS History: Patient Active Problem List   Diagnosis Date Noted  . Cervical dysplasia 06/09/2019  . Uterine polyp 04/20/2019  . Vaginal discomfort 02/17/2019  . Constipation 01/08/2019  . Chronic bilateral low back pain without sciatica 09/23/2018  . Obesity, Class III, BMI 40-49.9 (morbid obesity) ([facility]) 09/23/2018  . Pelvic pain 07/18/2018  . Headache 04/14/2018  . Bilateral carpal tunnel syndrome 02/09/2018   . Acute lumbar myofascial strain 12/29/2017  . Chronic post-traumatic stress disorder (PTSD) 12/29/2017  . Morbid obesity ([facility]) 11/13/2017  . Insomnia due to stress 11/09/2017  . Chronic toe pain, left foot 11/09/2017  . RLQ abdominal pain 07/13/2017  . Other hyperlipidemia 05/13/2017  . Vitamin D deficiency 05/13/2017  . Prediabetes 05/13/2017  . Shortness of breath on exertion 04/29/2017  . Enlarged thyroid 04/29/2017  . ETOHism ([facility]) 04/29/2017  . PCOS (polycystic ovarian syndrome) 04/29/2017  . Irregular periods/menstrual cycles 02/12/2017  . Vaginal dryness 02/12/2017  . Left carpal tunnel syndrome 07/08/2016  . Carpal tunnel syndrome, right upper limb 07/07/2013  . Breast pain, left 05/12/2012  . Palpitations 10/27/2011  . Dyshidrotic eczema 11/06/2008  . Tobacco use disorder 11/01/2008  . BMI 45.0-49.9, adult ([facility]) 02/03/2007  . Primary hypertension 05/28/2006  . GERD (gastroesophageal reflux disease) 05/28/2006   Past Medical History:  Diagnosis Date  . Alcohol abuse   . Anxiety   . Back pain   . Breast abscess   . Constipation   . Depression   . Gallbladder problem   . GERD (gastroesophageal reflux disease)   . HTN (hypertension)   . Hypertension   . Irregular periods/menstrual cycles   . Joint pain   . Lactose intolerance   . Leg edema   . Mastitis   . Mastitis    h/o right  . Obesity   . Obesity   . PCOS (polycystic ovarian syndrome)   . PTSD (post-traumatic stress disorder)   . Swallowing difficulty   . Tobacco abuse     Family History  Problem Relation Age of Onset  . Cancer Mother   . Hypertension Mother   . Heart disease Mother   . Depression Mother   . Bipolar disorder Mother   . Schizophrenia Mother   . Alcoholism Mother   . Drug abuse Mother   . Obesity Mother   . Hypertension Father   . Diabetes Father     Past Surgical History:  Procedure Laterality Date  . CHOLECYSTECTOMY  2002  . INCISION AND DRAINAGE BREAST ABSCESS      Social History   Occupational History  . Not on file  Tobacco Use  . Smoking status: Light Tobacco Smoker    Packs/day: 0.25    Years: 25.00    Pack years: 6.25    Types: Cigarettes  . Smokeless tobacco: Never Used  . Tobacco comment: "used to smoke 1/2 to 1ppd depending on stress"  Vaping Use  . Vaping Use: Never used  Substance and Sexual Activity  . Alcohol use: Yes    Alcohol/week: 3.0 standard drinks    Types: 3 Glasses of wine per week    Comment: occassional  . Drug use: No  . Sexual  activity: Yes    Partners: Female    Birth control/protection: Other-see comments, None    Comment: for past 21 years

## 2020-04-18 ENCOUNTER — Other Ambulatory Visit: Payer: Self-pay

## 2020-04-18 ENCOUNTER — Ambulatory Visit (INDEPENDENT_AMBULATORY_CARE_PROVIDER_SITE_OTHER): Payer: 59 | Admitting: Family Medicine

## 2020-04-18 VITALS — BP 134/90 | HR 78 | Temp 98.7°F | Ht 63.0 in

## 2020-04-18 DIAGNOSIS — R0981 Nasal congestion: Secondary | ICD-10-CM | POA: Diagnosis not present

## 2020-04-18 NOTE — Assessment & Plan Note (Signed)
Well-hydrated and in no acute distress on exam.  Vitals are stable.  Given the length of symptoms, advised that antibiotics would not be necessary at this time.  If symptoms continue over 1 week, would consider antibiotics for sinus infection at that time.  For now, advised patient that she should be retested for COVID.  Discussed that if her symptoms are improving and remain mild and she remains without fever on 1/21, she can return to work at that time given that this will be 5 days from her symptom onset, but she should continue to wear her mask.  Advised her to look at [facility] website for further guidelines.  Advised to return to care if she does not have improvement in her symptoms over the next week or if she develops shortness of breath or worsening of her symptoms.  She voiced understanding.  She was provided a letter for work.

## 2020-04-18 NOTE — Progress Notes (Signed)
    SUBJECTIVE:   CHIEF COMPLAINT / HPI:   Congestion Started 1/15 Cough, Congestion, fatigued, headache Has sore throat first few days Fever, 100.4 on 1/16 No shortness of breath Has been drinking, peeing No vomiting or diarrhea Girlfriend had similar symptoms last week, never got COVID testing results Got tested for COVID on 1/11 that came back negative because of an exposure, thinks it was PCR   PERTINENT  PMH / PSH: Morbid obesity, Prediabetes, HTN, GERD, PCOS  OBJECTIVE:   BP 134/90   Pulse 78   Temp 98.7 F (37.1 C) (Oral)   Ht 5\' 3"  (1.6 m)   LMP 04/15/2020   SpO2 98%   BMI 45.67 kg/m    Physical Exam:  General: 45 y.o. female in NAD HEENT: boggy nasal turbinates with clear rhinorrhea, throat clear, no sinus TTP, MMM Neck: No cervical LAD Cardio: RRR no m/r/g Lungs: CTAB, no wheezing, no rhonchi, no crackles, no IWOB on RA Skin: warm and dry Extremities: No edema, ambulating without difficulty   ASSESSMENT/PLAN:   Nasal congestion Well-hydrated and in no acute distress on exam.  Vitals are stable.  Given the length of symptoms, advised that antibiotics would not be necessary at this time.  If symptoms continue over 1 week, would consider antibiotics for sinus infection at that time.  For now, advised patient that she should be retested for COVID.  Discussed that if her symptoms are improving and remain mild and she remains without fever on 1/21, she can return to work at that time given that this will be 5 days from her symptom onset, but she should continue to wear her mask.  Advised her to look at [facility] website for further guidelines.  Advised to return to care if she does not have improvement in her symptoms over the next week or if she develops shortness of breath or worsening of her symptoms.  She voiced understanding.  She was provided a letter for work.     Cleophas Dunker, [place]

## 2020-04-18 NOTE — Patient Instructions (Signed)
We have tested you for COVID and they should not return to school/daycare until your test result returns and is negative.  If you are positive for COVID, you must stay out of work 5 days from symptom onset or positive test (whichever is first).  If after five days, you are without symptoms or have only minimal symptoms, you can go back to work, but MUST wear a mask.  COVID guidelines are changing rapidly, so I recommended checking the [facility] website as well.  You can try honey in warm (not hot) liquid.  You can also try nasal saline for congestion, vaseline for nose irritation, and a humidifer in their room at night.  Avoid over the counter cough/cold medications.  You can also give them tylenol or motrin.  Come back if you aren't better in a week or have difficulty breathing.  If you have any questions, please contact our office.

## 2020-04-21 LAB — NOVEL CORONAVIRUS, NAA: SARS-CoV-2, NAA: DETECTED — AB

## 2020-04-21 LAB — SARS-COV-2, NAA 2 DAY TAT

## 2020-04-23 ENCOUNTER — Encounter: Payer: Self-pay | Admitting: Physician Assistant

## 2020-04-23 ENCOUNTER — Encounter: Payer: Self-pay | Admitting: Family Medicine

## 2020-05-24 ENCOUNTER — Ambulatory Visit: Payer: 59 | Attending: Physician Assistant | Admitting: Physical Therapy

## 2020-05-24 ENCOUNTER — Other Ambulatory Visit: Payer: Self-pay

## 2020-05-24 ENCOUNTER — Encounter: Payer: Self-pay | Admitting: Physical Therapy

## 2020-05-24 DIAGNOSIS — M5442 Lumbago with sciatica, left side: Secondary | ICD-10-CM | POA: Diagnosis present

## 2020-05-24 DIAGNOSIS — G8929 Other chronic pain: Secondary | ICD-10-CM | POA: Diagnosis present

## 2020-05-24 DIAGNOSIS — M5441 Lumbago with sciatica, right side: Secondary | ICD-10-CM | POA: Insufficient documentation

## 2020-05-25 NOTE — Therapy (Signed)
Claudia Wall, [place], [address] Phone: [phone]   Fax:  [phone]  Physical Therapy Evaluation  Patient Details  Name: Claudia Wall MRN: [id number] Date of Birth: [birth year] Referring Provider (PT): Dwana Melena, [place]   Encounter Date: 05/24/2020   PT End of Session - 05/24/20 1538    Visit Number 1    Number of Visits 12    Date for PT Re-Evaluation 07/05/20    Authorization Type [employer]-24 visits, recheck FOTO by visit 6    PT Start Time 1544    PT Stop Time 1630    PT Time Calculation (min) 46 min    Activity Tolerance Patient tolerated treatment well    Behavior During Therapy [facility] for tasks assessed/performed           Past Medical History:  Diagnosis Date  . Alcohol abuse   . Anxiety   . Back pain   . Breast abscess   . Constipation   . Depression   . Gallbladder problem   . GERD (gastroesophageal reflux disease)   . HTN (hypertension)   . Hypertension   . Irregular periods/menstrual cycles   . Joint pain   . Lactose intolerance   . Leg edema   . Mastitis   . Mastitis    h/o right  . Obesity   . PCOS (polycystic ovarian syndrome)   . PTSD (post-traumatic stress disorder)   . Swallowing difficulty   . Tobacco abuse     Past Surgical History:  Procedure Laterality Date  . CHOLECYSTECTOMY  2002  . INCISION AND DRAINAGE BREAST ABSCESS      There were no vitals filed for this visit.    Subjective Assessment - 05/24/20 1624    Subjective Pt. is a 45 y/o female referred to PT for c/o chronic LBP with bilateral sciatica symptoms. She reports multi-year history of symptoms worse since around 2017 after incident assisting client into truck/trying to help prevent a fall otherwise no specific mechanism of injury noted. Pain regions include upper and lower lumbar + SIJ region pain with radiating into bilateral posterolateral hip/buttock region. No pain symptoms distal to hip/buttock  region. No saddle parasthesias or LE parasthesias or bowel/bladder changes noted. X-rays revealed mild degenerative changes from T12-L2 otherwise (-).Pt. does note history of feeling like her legs would buckle while her back was in a flexed position.    Pertinent History see PMH, anxiety/depression, tobacco use, obesity    Limitations Lifting;Standing;Walking    Diagnostic tests X-rays    Patient Stated Goals Get rid of back pain    Currently in Pain? Yes    Pain Score --   2-3/10   Pain Location Back    Pain Orientation Right;Left;Lower    Pain Descriptors / Indicators Dull;Aching;Sharp;Tightness    Pain Type Chronic pain    Pain Radiating Towards bilateral posterolateral hip/buttock region    Pain Onset More than a month ago    Pain Frequency Intermittent   sometimes pain is constant   Aggravating Factors  prolonged walking    Pain Relieving Factors muscle relaxers    Effect of Pain on Daily Activities limits activity/walking tolerance              [facility] PT Assessment - 05/25/20 0001      Assessment   Medical Diagnosis Chronic LBP with bilateral sciatica    Referring Provider (PT) Dwana Melena, PA-C    Onset Date/Surgical Date --   chronic-worse  since 2017   Prior Therapy none      Precautions   Precautions None      Restrictions   Weight Bearing Restrictions No      Balance Screen   Has the patient fallen in the past 6 months No      [place] residence    Home Access Level entry    Home Layout Two level    Alternate Level Stairs-Number of Steps 1 flight    Alternate Level Stairs-Rails Left;Right      Prior Function   Level of Independence Independent with basic ADLs;Independent with community mobility without device    Vocation Requirements 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 L1-S2 dermatomes     Deep Tendon Reflexes   DTR Assessment Site Patella;Achilles    Patella DTR 1+    Achilles DTR 2+      AROM   Overall AROM Comments Bilat. hip AROM/PROM grossly WFL, mild limitation lumbar extension AROM but no directional preference or concordant pain reproduction noted    Lumbar Flexion 100    Lumbar Extension 20    Lumbar - Right Side Bend 22    Lumbar - Left Side Bend 24    Lumbar - Right Rotation 50%    Lumbar - Left Rotation 50%      Strength   Right Hip Flexion 4+/5    Right Hip Extension 4/5    Right Hip External Rotation  5/5    Right Hip Internal Rotation 5/5    Right Hip ABduction 4/5    Right Hip ADduction 4+/5    Left Hip Flexion 4+/5    Left Hip Extension 4/5    Left Hip External Rotation 5/5    Left Hip Internal Rotation 5/5    Left Hip ABduction 4/5    Left Hip ADduction 4+/5    Right Knee Flexion 5/5    Right Knee Extension 5/5    Left Knee Flexion 5/5    Left Knee Extension 5/5    Right Ankle Dorsiflexion 5/5    Right Ankle Inversion 5/5    Right Ankle Eversion 5/5    Left Ankle Dorsiflexion 4/5    Left Ankle Inversion 4/5    Left Ankle Eversion 4/5      Flexibility   Soft Tissue Assessment /Muscle Length --   SLR 90 deg bilat., mild piriformis tightness bilat.     Palpation   Palpation comment TTP with significant tightness noted bilat. lumbar paraspinals with concordant pain noted, TTP/tight also bilat. glut max and piriformis region      Special Tests   Other special tests SLR (-), FADIR (-) bilat.                      Objective measurements completed on examination: See above findings.       [facility] Adult PT Treatment/Exercise - 05/25/20 0001      Exercises   Exercises --   HEP handout review                 PT Education - 05/25/20 1448  Education  Details HEP, POC, dry needling, tennis ball use for lumbar paraspinal release    Person(s) Educated Patient    Methods Explanation;Verbal cues;Handout   HEP handout provided   Comprehension Verbalized understanding               PT Long Term Goals - 05/25/20 1457      PT LONG TERM GOAL #1   Title Independent with HEP    Baseline needs HEP    Time 6    Period Weeks    Status New    Target Date 07/05/20      PT LONG TERM GOAL #2   Title Improve FOTO outcome measure score to 58% or greater functional status    Baseline 44%    Time 6    Period Weeks    Status New    Target Date 07/05/20      PT LONG TERM GOAL #3   Title Increase bilat. hip extension and abduction strength 1/2 MMT grade to improve ability for lifting client and improved lumbopelvic stability to decrease pain with ADLs    Baseline 4/5    Time 6    Period Weeks    Status New    Target Date 07/05/20      PT LONG TERM GOAL #4   Title Tolerate walking periods at least 30-40 min with LBP symptoms decreased 50% or greater from baseline status    Time 6    Period Weeks    Status New    Target Date 07/05/20      PT LONG TERM GOAL #5   Title Return body mechanics demos for assisting client with transfers with decreased lumbar strain    Time 6    Period Weeks    Status New    Target Date 07/05/20                  Plan - 05/25/20 1450    Clinical Impression Statement Pt. presents with chronic LBP with radiating pain into bilateral posterolateral hip region. No over directional preferance noted for trunk ROM and SLR test (-). Concordant pain reproduced with palpation lumbar paraspinals and gluteal region musculature so would suspect myofascial etiology/contribution to current symptoms. Pt. does also note SIJ region pain and has history pregnancy/childbirth so would suspect potential contribution from SIJ but limited time to assess during eval. Pt. would benefit from PT to help relieve pain and address  associated functional limitations.    Personal Factors and Comorbidities Comorbidity 3+;Time since onset of injury/illness/exacerbation    Comorbidities see PMH    Examination-Activity Limitations Stand;Patent attorney for Others;Lift;Bend    Examination-Participation Restrictions Community Activity;Occupation    Stability/Clinical Decision Making Evolving/Moderate complexity    Clinical Decision Making Moderate    Rehab Potential Good    PT Frequency --   1-2x/week   PT Duration 6 weeks    PT Treatment/Interventions ADLs/Self Care Home Management;Cryotherapy;Electrical Stimulation;Moist Heat;Iontophoresis 4mg /ml Dexamethasone;Gait training;Therapeutic exercise;Patient/family education;Manual techniques;Dry needling;Functional mobility training;Neuromuscular re-education;Therapeutic activities;Ultrasound;Spinal Manipulations;Taping    PT Next Visit Plan review FOTO patient report by visit 3, consider trial dry needling to lumbar paraspinals/longissimus and gluteals, check SIJ for concordant pain, review HEP as needed, manual/STM, continue stretches and progress back and core strengthening as tolerated    PT Home Exercise Plan Access code: [email or web address]    Consulted and Agree with Plan of Care Patient           Patient will benefit from skilled therapeutic intervention in order  to improve the following deficits and impairments:  Pain,Impaired flexibility,Decreased strength,Decreased activity tolerance,Increased muscle spasms,Decreased range of motion  Visit Diagnosis: Chronic bilateral low back pain with bilateral sciatica     Problem List Patient Active Problem List   Diagnosis Date Noted  . Nasal congestion 04/18/2020  . Cervical dysplasia 06/09/2019  . Uterine polyp 04/20/2019  . Vaginal discomfort 02/17/2019  . Constipation 01/08/2019  . Chronic bilateral low back pain without sciatica 09/23/2018  . Obesity, Class III, BMI 40-49.9 (morbid obesity) ([place]) 09/23/2018  . Pelvic  pain 07/18/2018  . Headache 04/14/2018  . Bilateral carpal tunnel syndrome 02/09/2018  . Acute lumbar myofascial strain 12/29/2017  . Chronic post-traumatic stress disorder (PTSD) 12/29/2017  . Morbid obesity ([place]) 11/13/2017  . Insomnia due to stress 11/09/2017  . Chronic toe pain, left foot 11/09/2017  . RLQ abdominal pain 07/13/2017  . Other hyperlipidemia 05/13/2017  . Vitamin D deficiency 05/13/2017  . Prediabetes 05/13/2017  . Shortness of breath on exertion 04/29/2017  . Enlarged thyroid 04/29/2017  . ETOHism ([place]) 04/29/2017  . PCOS (polycystic ovarian syndrome) 04/29/2017  . Irregular periods/menstrual cycles 02/12/2017  . Vaginal dryness 02/12/2017  . Left carpal tunnel syndrome 07/08/2016  . Carpal tunnel syndrome, right upper limb 07/07/2013  . Breast pain, left 05/12/2012  . Palpitations 10/27/2011  . Dyshidrotic eczema 11/06/2008  . Tobacco use disorder 11/01/2008  . BMI 45.0-49.9, adult ([place]) 02/03/2007  . Primary hypertension 05/28/2006  . GERD (gastroesophageal reflux disease) 05/28/2006    Beaulah Dinning, PT, DPT 05/25/20 3:00 PM  [facility] [address] [place], [place], [address] Phone: [phone]   Fax:  [phone]  Name: Claudia Wall MRN: [id number] Date of Birth: [birth year]

## 2020-06-14 ENCOUNTER — Ambulatory Visit: Payer: 59 | Admitting: Physical Therapy

## 2020-06-21 ENCOUNTER — Other Ambulatory Visit: Payer: Self-pay

## 2020-06-21 ENCOUNTER — Encounter: Payer: Self-pay | Admitting: Physical Therapy

## 2020-06-21 ENCOUNTER — Ambulatory Visit: Payer: 59 | Attending: Physician Assistant | Admitting: Physical Therapy

## 2020-06-21 DIAGNOSIS — M5442 Lumbago with sciatica, left side: Secondary | ICD-10-CM | POA: Insufficient documentation

## 2020-06-21 DIAGNOSIS — G8929 Other chronic pain: Secondary | ICD-10-CM | POA: Insufficient documentation

## 2020-06-21 DIAGNOSIS — M5441 Lumbago with sciatica, right side: Secondary | ICD-10-CM | POA: Diagnosis present

## 2020-06-22 ENCOUNTER — Encounter: Payer: Self-pay | Admitting: Physical Therapy

## 2020-06-22 NOTE — Therapy (Signed)
[place], [place], [address] Phone: [phone]   Fax:  [phone]  Physical Therapy Treatment  Patient Details  Name: JADIA CAPERS MRN: [id number] Date of Birth: [birth year] Referring Provider (PT): Dwana Melena, [place]   Encounter Date: 06/21/2020   PT End of Session - 06/22/20 0857    Visit Number 2    Number of Visits 12    Date for PT Re-Evaluation 07/05/20    Authorization Type [employer]-24 visits, recheck FOTO by visit 6    PT Start Time 1545    PT Stop Time 1627    PT Time Calculation (min) 42 min    Activity Tolerance Patient tolerated treatment well    Behavior During Therapy [facility] for tasks assessed/performed           Past Medical History:  Diagnosis Date  . Alcohol abuse   . Anxiety   . Back pain   . Breast abscess   . Constipation   . Depression   . Gallbladder problem   . GERD (gastroesophageal reflux disease)   . HTN (hypertension)   . Hypertension   . Irregular periods/menstrual cycles   . Joint pain   . Lactose intolerance   . Leg edema   . Mastitis   . Mastitis    h/o right  . Obesity   . PCOS (polycystic ovarian syndrome)   . PTSD (post-traumatic stress disorder)   . Swallowing difficulty   . Tobacco abuse     Past Surgical History:  Procedure Laterality Date  . CHOLECYSTECTOMY  2002  . INCISION AND DRAINAGE BREAST ABSCESS      There were no vitals filed for this visit.   Subjective Assessment - 06/22/20 0849    Subjective Patient reports she continues to have pain. some days are wqorse then others. Today has been sore. she has increased pain as her day goes along. She has tried some of the strethces and exercises.    Pertinent History see PMH, anxiety/depression, tobacco use, obesity    Limitations Lifting;Standing;Walking    Diagnostic tests X-rays    Patient Stated Goals Get rid of back pain    Currently in Pain? Yes    Pain Score 6     Pain Orientation  Right    Pain Descriptors / Indicators Aching    Pain Type Chronic pain    Pain Radiating Towards into buttock    Pain Onset More than a month ago    Pain Frequency Intermittent    Aggravating Factors  standing and walking    Pain Relieving Factors muscle relaxers    Effect of Pain on Daily Activities limits activity/ walking tolerance                             [facility] Adult PT Treatment/Exercise - 06/22/20 0001      Self-Care   Self-Care Other Self-Care Comments    Other Self-Care Comments  re3viewed how to use the thera-cane and where to buy it; reviewed trigger points; reviewed anatomy of transverse abdominal muslces.      Exercises   Exercises Lumbar      Lumbar Exercises: Stretches   Lower Trunk Rotation Limitations reviewed for home x10 5 sec hold each side    Piriformis Stretch Limitations 3x20 sec hold    Other Lumbar Stretch Exercise reviewed tennsi ball release and use of ther-a-cane      Lumbar Exercises:  Supine   AB Set Limitations reviewed abdominal breathing for TA activiation    Pelvic Tilt Limitations 2x10 with breathing    Clam Limitations 2x10 with breathing      Manual Therapy   Manual Therapy Joint mobilization;Soft tissue mobilization;Manual Traction    Soft tissue mobilization to lower lumbar spine and gluteals. Patient had significant gaurding. Advised she may need to work on desensitization at home    Manual Traction LAD with grade II occilations felt better on the left then the right. Cuing for tehcnique                       PT Long Term Goals - 05/25/20 1457      PT LONG TERM GOAL #1   Title Independent with HEP    Baseline needs HEP    Time 6    Period Weeks    Status New    Target Date 07/05/20      PT LONG TERM GOAL #2   Title Improve FOTO outcome measure score to 58% or greater functional status    Baseline 44%    Time 6    Period Weeks    Status New    Target Date 07/05/20      PT LONG TERM GOAL #3    Title Increase bilat. hip extension and abduction strength 1/2 MMT grade to improve ability for lifting client and improved lumbopelvic stability to decrease pain with ADLs    Baseline 4/5    Time 6    Period Weeks    Status New    Target Date 07/05/20      PT LONG TERM GOAL #4   Title Tolerate walking periods at least 30-40 min with LBP symptoms decreased 50% or greater from baseline status    Time 6    Period Weeks    Status New    Target Date 07/05/20      PT LONG TERM GOAL #5   Title Return body mechanics demos for assisting client with transfers with decreased lumbar strain    Time 6    Period Weeks    Status New    Target Date 07/05/20                 Plan - 06/21/20 1621    Clinical Impression Statement Patient had significant tenderness to palpation and light touch today. She was advised she likley needs to perfrom self soft titssue mobilization frequently within tolerance. She was given a tennis ball and shown how to use the thera-cane. Therapy reviewwed the difference between stretchign and exercises. Therayp also reviewwed abdominal breathing. Patient is interested in dry needling but was very sensative today. She may beenfit from needling with e-stim. She did well with pelvic tilts and exercises. Therapy also reviewed proper log roll which helped her get up pain free.    Personal Factors and Comorbidities Comorbidity 3+;Time since onset of injury/illness/exacerbation    Comorbidities see PMH    Examination-Activity Limitations Stand;Patent attorney for Others;Lift;Bend    Examination-Participation Restrictions Community Activity;Occupation    Stability/Clinical Decision Making Evolving/Moderate complexity    Clinical Decision Making Moderate    Rehab Potential Good    PT Duration 6 weeks    PT Treatment/Interventions ADLs/Self Care Home Management;Cryotherapy;Electrical Stimulation;Moist Heat;Iontophoresis 4mg /ml Dexamethasone;Gait training;Therapeutic  exercise;Patient/family education;Manual techniques;Dry needling;Functional mobility training;Neuromuscular re-education;Therapeutic activities;Ultrasound;Spinal Manipulations;Taping    PT Next Visit Plan review FOTO patient report by visit 3, consider trial dry needling to lumbar  paraspinals/longissimus and gluteals, check SIJ for concordant pain, review HEP as needed, manual/STM, continue stretches and progress back and core strengthening as tolerated    PT Home Exercise Plan Access code: [email or web address]    Consulted and Agree with Plan of Care Patient           Patient will benefit from skilled therapeutic intervention in order to improve the following deficits and impairments:  Pain,Impaired flexibility,Decreased strength,Decreased activity tolerance,Increased muscle spasms,Decreased range of motion  Visit Diagnosis: Chronic bilateral low back pain with bilateral sciatica     Problem List Patient Active Problem List   Diagnosis Date Noted  . Nasal congestion 04/18/2020  . Cervical dysplasia 06/09/2019  . Uterine polyp 04/20/2019  . Vaginal discomfort 02/17/2019  . Constipation 01/08/2019  . Chronic bilateral low back pain without sciatica 09/23/2018  . Obesity, Class III, BMI 40-49.9 (morbid obesity) ([place]) 09/23/2018  . Pelvic pain 07/18/2018  . Headache 04/14/2018  . Bilateral carpal tunnel syndrome 02/09/2018  . Acute lumbar myofascial strain 12/29/2017  . Chronic post-traumatic stress disorder (PTSD) 12/29/2017  . Morbid obesity ([place]) 11/13/2017  . Insomnia due to stress 11/09/2017  . Chronic toe pain, left foot 11/09/2017  . RLQ abdominal pain 07/13/2017  . Other hyperlipidemia 05/13/2017  . Vitamin D deficiency 05/13/2017  . Prediabetes 05/13/2017  . Shortness of breath on exertion 04/29/2017  . Enlarged thyroid 04/29/2017  . ETOHism ([place]) 04/29/2017  . PCOS (polycystic ovarian syndrome) 04/29/2017  . Irregular periods/menstrual cycles 02/12/2017  . Vaginal dryness  02/12/2017  . Left carpal tunnel syndrome 07/08/2016  . Carpal tunnel syndrome, right upper limb 07/07/2013  . Breast pain, left 05/12/2012  . Palpitations 10/27/2011  . Dyshidrotic eczema 11/06/2008  . Tobacco use disorder 11/01/2008  . BMI 45.0-49.9, adult ([place]) 02/03/2007  . Primary hypertension 05/28/2006  . GERD (gastroesophageal reflux disease) 05/28/2006    Carney Living PT DPT  06/22/2020, 9:11 AM  [facility] [address] [place], [place], [address] Phone: [phone]   Fax:  [phone]  Name: TAYLORANNE LEKAS MRN: [id number] Date of Birth: March 16, 1976

## 2020-06-25 ENCOUNTER — Ambulatory Visit: Payer: 59 | Admitting: Physical Therapy

## 2020-06-25 ENCOUNTER — Telehealth: Payer: Self-pay | Admitting: Physical Therapy

## 2020-06-25 NOTE — Telephone Encounter (Signed)
Patient contacted regarding no-show appointment. Message left stating next visit on 3/31. Patient advised to call if she can not make it.

## 2020-06-28 ENCOUNTER — Ambulatory Visit: Payer: 59 | Admitting: Physical Therapy

## 2020-07-02 ENCOUNTER — Telehealth: Payer: Self-pay | Admitting: Physical Therapy

## 2020-07-02 ENCOUNTER — Ambulatory Visit: Payer: 59 | Attending: Physician Assistant | Admitting: Physical Therapy

## 2020-07-02 NOTE — Telephone Encounter (Signed)
Called patient regarding 2nd consecutive no-show appointment. Per attendance policy patients last two appointments cancelled. Patient advised of her next appointment and encorraged to call if she can not make it.

## 2020-07-05 ENCOUNTER — Ambulatory Visit: Payer: 59 | Admitting: Physical Therapy

## 2020-07-05 NOTE — Therapy (Signed)
[place], [place], [address] Phone: [phone]   Fax:  [phone]  Physical Therapy Treatment/Discharge  Patient Details  Name: Claudia Wall MRN: [id number] Date of Birth: Apr 02, 1975 Referring Provider (PT): Dwana Melena, [place]   Encounter Date: 06/21/2020    Past Medical History:  Diagnosis Date  . Alcohol abuse   . Anxiety   . Back pain   . Breast abscess   . Constipation   . Depression   . Gallbladder problem   . GERD (gastroesophageal reflux disease)   . HTN (hypertension)   . Hypertension   . Irregular periods/menstrual cycles   . Joint pain   . Lactose intolerance   . Leg edema   . Mastitis   . Mastitis    h/o right  . Obesity   . PCOS (polycystic ovarian syndrome)   . PTSD (post-traumatic stress disorder)   . Swallowing difficulty   . Tobacco abuse     Past Surgical History:  Procedure Laterality Date  . CHOLECYSTECTOMY  2002  . INCISION AND DRAINAGE BREAST ABSCESS      There were no vitals filed for this visit.                                   PT Long Term Goals - 05/25/20 1457      PT LONG TERM GOAL #1   Title Independent with HEP    Baseline needs HEP    Time 6    Period Weeks    Status New    Target Date 07/05/20      PT LONG TERM GOAL #2   Title Improve FOTO outcome measure score to 58% or greater functional status    Baseline 44%    Time 6    Period Weeks    Status New    Target Date 07/05/20      PT LONG TERM GOAL #3   Title Increase bilat. hip extension and abduction strength 1/2 MMT grade to improve ability for lifting client and improved lumbopelvic stability to decrease pain with ADLs    Baseline 4/5    Time 6    Period Weeks    Status New    Target Date 07/05/20      PT LONG TERM GOAL #4   Title Tolerate walking periods at least 30-40 min with LBP symptoms decreased 50% or greater from baseline status    Time  6    Period Weeks    Status New    Target Date 07/05/20      PT LONG TERM GOAL #5   Title Return body mechanics demos for assisting client with transfers with decreased lumbar strain    Time 6    Period Weeks    Status New    Target Date 07/05/20                  Patient will benefit from skilled therapeutic intervention in order to improve the following deficits and impairments:  Pain,Impaired flexibility,Decreased strength,Decreased activity tolerance,Increased muscle spasms,Decreased range of motion  Visit Diagnosis: Chronic bilateral low back pain with bilateral sciatica     Problem List Patient Active Problem List   Diagnosis Date Noted  . Nasal congestion 04/18/2020  . Cervical dysplasia 06/09/2019  . Uterine polyp 04/20/2019  . Vaginal discomfort 02/17/2019  . Constipation 01/08/2019  . Chronic bilateral low back pain  without sciatica 09/23/2018  . Obesity, Class III, BMI 40-49.9 (morbid obesity) ([place]) 09/23/2018  . Pelvic pain 07/18/2018  . Headache 04/14/2018  . Bilateral carpal tunnel syndrome 02/09/2018  . Acute lumbar myofascial strain 12/29/2017  . Chronic post-traumatic stress disorder (PTSD) 12/29/2017  . Morbid obesity ([place]) 11/13/2017  . Insomnia due to stress 11/09/2017  . Chronic toe pain, left foot 11/09/2017  . RLQ abdominal pain 07/13/2017  . Other hyperlipidemia 05/13/2017  . Vitamin D deficiency 05/13/2017  . Prediabetes 05/13/2017  . Shortness of breath on exertion 04/29/2017  . Enlarged thyroid 04/29/2017  . ETOHism ([place]) 04/29/2017  . PCOS (polycystic ovarian syndrome) 04/29/2017  . Irregular periods/menstrual cycles 02/12/2017  . Vaginal dryness 02/12/2017  . Left carpal tunnel syndrome 07/08/2016  . Carpal tunnel syndrome, right upper limb 07/07/2013  . Breast pain, left 05/12/2012  . Palpitations 10/27/2011  . Dyshidrotic eczema 11/06/2008  . Tobacco use disorder 11/01/2008  . BMI 45.0-49.9, adult ([place]) 02/03/2007  .  Primary hypertension 05/28/2006  . GERD (gastroesophageal reflux disease) 05/28/2006       PHYSICAL THERAPY DISCHARGE SUMMARY  Visits from Start of Care: 2  Current functional level related to goals / functional outcomes: Patient attended 2 therapy visits and did not return after last visit 06/21/20 with no shows for last 3 schedule visits. D/c per attendance policy.   Remaining deficits: Status unknown/did not return   Education / Equipment: Previous HEP instruction Plan: Patient agrees to discharge.  Patient goals were not met. Patient is being discharged due to not returning since the last visit.  ?????          Beaulah Dinning, PT, DPT 07/05/20 4:55 PM    [facility] [address] [place], [place], [address] Phone: [phone]   Fax:  [phone]  Name: Claudia Wall MRN: [id number] Date of Birth: [birth year]

## 2020-07-09 ENCOUNTER — Encounter: Payer: 59 | Admitting: Physical Therapy

## 2020-07-12 ENCOUNTER — Encounter: Payer: 59 | Admitting: Physical Therapy

## 2020-08-09 ENCOUNTER — Ambulatory Visit: Payer: 59

## 2020-08-23 ENCOUNTER — Ambulatory Visit (INDEPENDENT_AMBULATORY_CARE_PROVIDER_SITE_OTHER): Payer: PRIVATE HEALTH INSURANCE | Admitting: Family Medicine

## 2020-08-23 ENCOUNTER — Other Ambulatory Visit: Payer: Self-pay

## 2020-08-23 ENCOUNTER — Encounter: Payer: Self-pay | Admitting: Family Medicine

## 2020-08-23 VITALS — BP 136/97 | HR 67 | Ht 63.0 in | Wt 251.8 lb

## 2020-08-23 DIAGNOSIS — Z1159 Encounter for screening for other viral diseases: Secondary | ICD-10-CM

## 2020-08-23 DIAGNOSIS — I10 Essential (primary) hypertension: Secondary | ICD-10-CM | POA: Diagnosis not present

## 2020-08-23 MED ORDER — ADULT BLOOD PRESSURE CUFF LG KIT: PACK | 0 refills | Status: AC

## 2020-08-23 NOTE — Patient Instructions (Addendum)
It was great to meet you!  Things we discussed at today's visit: - Check your blood pressure once daily. Keep a written log and bring it to your next appointment. - Keep taking the HCTZ 25mg  once daily - Aim to walk 20-30 minutes after dinner 5x/week. It's ok to start slow and work your way up!  We are checking some labs. We will send you a Mychart message with the results or call if they are abnormal.    Take care and seek immediate care sooner if you develop any concerns.  Dr. [facility]

## 2020-08-23 NOTE — Progress Notes (Signed)
    SUBJECTIVE:   CHIEF COMPLAINT / HPI:   HTN Patient is here to discuss her blood pressure. She checks it regularly at home using a wrist cuff. The other night it was 177/103 which concerned her. She takes HCTZ 25mg  daily with good compliance. That day, patient took an extra HCTZ because her blood pressure was high. She also reports she drank 3 beers that day and it was a busy day due to her daughter's baby shower. Since then her BP has been in the 110s at home. This morning 117/77. She tries to eat healthy overall and watch her salt intake. Does not salt her foods or cook with salt/salt substitutes. She does eat deli meats regularly and [employer] sometimes for lunch.   PERTINENT  PMH / PSH: HTN, HLD, prediabetes, GERD, PCOS  OBJECTIVE:   BP (!) 136/97   Pulse 67   Ht 5\' 3"  (1.6 m)   Wt 251 lb 12.8 oz (114.2 kg)   LMP 08/12/2020   SpO2 (!) 85%   BMI 44.60 kg/m   General: NAD, pleasant, able to participate in exam Cardiac: RRR, S1 S2 present Respiratory: normal effort Extremities: no edema or cyanosis. Skin: warm and dry Neuro: alert, no obvious focal deficits Psych: Normal affect and mood   ASSESSMENT/PLAN:   Primary hypertension BP minimally elevated today at 136/97. -Check BMP today -Continue HCTZ 25mg  -Monitor with home BP cuff (on upper arm, not wrist). Keep written log and bring to next appointment. -Discussed relationship between alcohol and blood pressure. -Lifestyle modifications to help with BP and to achieve healthier BMI. Discussed limiting Na intake and the importance of regular physical activity.   Health Maintenance Hep C screening obtained today   Alcus Dad, MD [place]

## 2020-08-23 NOTE — Assessment & Plan Note (Signed)
BP minimally elevated today at 136/97. -Check BMP today -Continue HCTZ 25mg  -Monitor with home BP cuff (on upper arm, not wrist). Keep written log and bring to next appointment. -Discussed relationship between alcohol and blood pressure. -Lifestyle modifications to help with BP and to achieve healthier BMI. Discussed limiting Na intake and the importance of regular physical activity.

## 2020-08-24 LAB — BASIC METABOLIC PANEL
BUN/Creatinine Ratio: 11 (ref 9–23)
BUN: 10 mg/dL (ref 6–24)
CO2: 24 mmol/L (ref 20–29)
Calcium: 10 mg/dL (ref 8.7–10.2)
Chloride: 100 mmol/L (ref 96–106)
Creatinine, Ser: 0.88 mg/dL (ref 0.57–1.00)
Glucose: 78 mg/dL (ref 65–99)
Potassium: 3.9 mmol/L (ref 3.5–5.2)
Sodium: 140 mmol/L (ref 134–144)
eGFR: 83 mL/min/{1.73_m2} (ref >59)

## 2020-08-24 LAB — HCV AB W REFLEX TO QUANT PCR: HCV Ab: <0.1 s/co ratio (ref 0.0–0.9)

## 2020-08-24 LAB — HCV INTERPRETATION

## 2020-09-19 ENCOUNTER — Ambulatory Visit: Payer: PRIVATE HEALTH INSURANCE | Admitting: Family Medicine

## 2020-10-19 ENCOUNTER — Other Ambulatory Visit: Payer: Self-pay

## 2020-10-19 ENCOUNTER — Ambulatory Visit (INDEPENDENT_AMBULATORY_CARE_PROVIDER_SITE_OTHER): Payer: PRIVATE HEALTH INSURANCE | Admitting: Family Medicine

## 2020-10-19 VITALS — BP 122/75 | HR 88 | Ht 63.0 in | Wt 254.0 lb

## 2020-10-19 DIAGNOSIS — F40243 Fear of flying: Secondary | ICD-10-CM | POA: Diagnosis not present

## 2020-10-19 DIAGNOSIS — F419 Anxiety disorder, unspecified: Secondary | ICD-10-CM

## 2020-10-19 DIAGNOSIS — R7303 Prediabetes: Secondary | ICD-10-CM | POA: Diagnosis not present

## 2020-10-19 LAB — POCT GLYCOSYLATED HEMOGLOBIN (HGB A1C): HbA1c, POC (prediabetic range): 5.8 % (ref 5.7–6.4)

## 2020-10-19 MED ORDER — LORAZEPAM 0.5 MG PO TABS: 0.5000 mg | ORAL_TABLET | Freq: Two times a day (BID) | ORAL | 0 refills | Status: DC | PRN

## 2020-10-19 NOTE — Patient Instructions (Signed)
It was good to see you today.  Thank you for coming in.  I will prescribe four 0.5 mg Ativan for your trip   We discussed the sedating effects these medications can have and to avoid any alcohol or taking any benadryl.  Enjoy your trip and follow-up as needed in regards to anxiety/strategies for weight loss.  Be Well, Dr Manus Rudd

## 2020-10-19 NOTE — Progress Notes (Signed)
a1c

## 2020-10-20 ENCOUNTER — Encounter: Payer: Self-pay | Admitting: Family Medicine

## 2020-10-20 DIAGNOSIS — F40243 Fear of flying: Secondary | ICD-10-CM | POA: Insufficient documentation

## 2020-10-20 DIAGNOSIS — F419 Anxiety disorder, unspecified: Secondary | ICD-10-CM | POA: Insufficient documentation

## 2020-10-20 NOTE — Assessment & Plan Note (Addendum)
Reports that flying exacerbates underlying anxiety.  Patient requesting Ativan as indicates took this before on flight and greatly relieved anxiety.  PDMP aware score of 0.  Had discussion about sedating effects, avoiding operating of heavy machinery, and avoiding alcohol. - Prescribe Ativan four 0.5 mg pills for upcoming flight

## 2020-10-20 NOTE — Progress Notes (Addendum)
    SUBJECTIVE:   CHIEF COMPLAINT / HPI: Anxiety  Patient presenting due to history of anxiety.  Was previously on Prozac and helped but patient stopped due to heart palpitations.  Does express interest in trying new SSRI going forward to help better control.  Has fear of flying and has trip planned coming up in August to [place].  Reports significant fear of flying and indicates has been prescribed small doses of Ativan in past for plane flights and helped manage anxiety and avoid panic attacks.   Denies alcohol use, smoking or other drug use.  PERTINENT  PMH / PSH: GAD  OBJECTIVE:   BP 122/75   Pulse 88   Ht '5\' 3"'$  (1.6 m)   Wt 254 lb (115.2 kg)   SpO2 100%   BMI 44.99 kg/m    Physical Exam HENT:     Head: Normocephalic and atraumatic.     Mouth/Throat:     Mouth: Mucous membranes are moist.  Cardiovascular:     Rate and Rhythm: Normal rate and regular rhythm.  Pulmonary:     Effort: Pulmonary effort is normal.     Breath sounds: Normal breath sounds.  Skin:    General: Skin is warm.  Neurological:     General: No focal deficit present.     Mental Status: She is alert.     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  Patient requesting Ativan as indicates took this before on flight and greatly relieved anxiety.  PDMP aware score of 0.  Had discussion about sedating effects, avoiding operating of heavy machinery, and avoiding alcohol. - Prescribe Ativan four 0.5 mg pills for upcoming flight  Anxiety Patient expressing interest in trying further medication options going forward to help control anxiety. - follow-up in 2 months to further discuss  Prediabetes Repeat A1C today stable at 7.4.  Praised patient for avoidance of drinking sugary beverage and limiting carb intake. - follow-up further nutrtion and weight loss goals in 2 months     Delora Fuel, MD [place]

## 2020-10-20 NOTE — Assessment & Plan Note (Signed)
Patient expressing interest in trying further medication options going forward to help control anxiety. - follow-up in 2 months to further discuss

## 2020-10-20 NOTE — Assessment & Plan Note (Signed)
Repeat A1C today stable at 7.4.  Praised patient for avoidance of drinking sugary beverage and limiting carb intake. - follow-up further nutrtion and weight loss goals in 2 months

## 2021-01-09 ENCOUNTER — Encounter: Payer: Self-pay | Admitting: Student

## 2021-01-11 ENCOUNTER — Ambulatory Visit
Admission: EM | Admit: 2021-01-11 | Discharge: 2021-01-11 | Disposition: A | Discharge status: Home / Self Care | Payer: PRIVATE HEALTH INSURANCE | Attending: Physician Assistant | Admitting: Physician Assistant

## 2021-01-11 ENCOUNTER — Encounter: Payer: Self-pay | Admitting: Emergency Medicine

## 2021-01-11 ENCOUNTER — Other Ambulatory Visit: Payer: Self-pay

## 2021-01-11 DIAGNOSIS — R002 Palpitations: Secondary | ICD-10-CM

## 2021-01-11 HISTORY — DX: Leiomyoma of uterus, unspecified: D25.9

## 2021-01-11 NOTE — ED Provider Notes (Signed)
[facility]    CSN: [id number] Arrival date & time: 01/11/21  0809      History   Chief Complaint Chief Complaint  Patient presents with   Palpitations    HPI Claudia Wall is a 45 y.o. female.   Patient here today for evaluation of palpitations that have been ongoing for a while.  She reports that she has these episodes about 5-6 times per week.  They typically only last about a second and then are gone.  She denies any chest pain or shortness of breath.  She denies any lightheadedness, nausea or vomiting.  The history is provided by the patient.  Palpitations Associated symptoms: no chest pain, no nausea, no shortness of breath and no vomiting    Past Medical History:  Diagnosis Date   Alcohol abuse    Anxiety    Back pain    Breast abscess    Constipation    Depression    Gallbladder problem    GERD (gastroesophageal reflux disease)    HTN (hypertension)    Hypertension    Irregular periods/menstrual cycles    Joint pain    Lactose intolerance    Leg edema    Mastitis    Mastitis    h/o right   Obesity    PCOS (polycystic ovarian syndrome)    PTSD (post-traumatic stress disorder)    Swallowing difficulty    Tobacco abuse    Uterine fibroid     Patient Active Problem List   Diagnosis Date Noted   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   Anxiety 10/20/2020   Nasal congestion 04/18/2020   Cervical dysplasia 06/09/2019   Uterine polyp 04/20/2019   Vaginal discomfort 02/17/2019   Constipation 01/08/2019   Chronic bilateral low back pain without sciatica 09/23/2018   Obesity, Class III, BMI 40-49.9 (morbid obesity) ([place]) 09/23/2018   Pelvic pain 07/18/2018   Headache 04/14/2018   Bilateral carpal tunnel syndrome 02/09/2018   Acute lumbar myofascial strain 12/29/2017   Chronic post-traumatic stress disorder (PTSD) 12/29/2017   Morbid obesity ([place]) 11/13/2017   Insomnia due to stress 11/09/2017   Chronic toe pain, left foot 11/09/2017   RLQ  abdominal pain 07/13/2017   Other hyperlipidemia 05/13/2017   Vitamin D deficiency 05/13/2017   Prediabetes 05/13/2017   Shortness of breath on exertion 04/29/2017   Enlarged thyroid 04/29/2017   ETOHism ([place]) 04/29/2017   PCOS (polycystic ovarian syndrome) 04/29/2017   Irregular periods/menstrual cycles 02/12/2017   Vaginal dryness 02/12/2017   Left carpal tunnel syndrome 07/08/2016   Carpal tunnel syndrome, right upper limb 07/07/2013   Breast pain, left 05/12/2012   Palpitations 10/27/2011   Dyshidrotic eczema 11/06/2008   Tobacco use disorder 11/01/2008   BMI 45.0-49.9, adult ([place]) 02/03/2007   Primary hypertension 05/28/2006   GERD (gastroesophageal reflux disease) 05/28/2006    Past Surgical History:  Procedure Laterality Date   CHOLECYSTECTOMY  2002   INCISION AND DRAINAGE BREAST ABSCESS      OB History     Gravida  2   Para      Term      Preterm      AB      Living  2      SAB      IAB      Ectopic      Multiple      Live Births           Obstetric Comments  Vag delivery x 2  Home Medications    Prior to Admission medications   Medication Sig Start Date End Date Taking? Authorizing Provider  Blood Pressure Monitoring (ADULT BLOOD PRESSURE CUFF LG) KIT Check your blood pressure once daily. Keep a written log and bring to your next doctor's appointment. 08/23/20   Alcus Dad, MD  Cholecalciferol (VITAMIN D) 50 MCG (2000 UT) CAPS Take one pill daily Patient not taking: No sig reported 09/26/19   Guadalupe Dawn, MD  hydrochlorothiazide (HYDRODIURIL) 25 MG tablet Take 1 tablet (25 mg total) by mouth daily. 03/19/20   Maness, Arnette Norris, MD  LORazepam (ATIVAN) 0.5 MG tablet Take 1 tablet (0.5 mg total) by mouth 2 (two) times daily as needed for anxiety. 10/19/20   Maness, Arnette Norris, MD  meloxicam (MOBIC) 15 MG tablet Take 1 tablet (15 mg total) by mouth daily. 09/22/19   Guadalupe Dawn, MD  methocarbamol (ROBAXIN) 500 MG tablet Take 1  tablet (500 mg total) by mouth at bedtime. 12/23/19   Daisy Floro, DO  Vitamin D, Ergocalciferol, (DRISDOL) 1.25 MG (50000 UT) CAPS capsule Take 1 capsule (50,000 Units total) by mouth every 7 (seven) days. Patient not taking: No sig reported 04/29/18   Harriet Butte, DO    Family History Family History  Problem Relation Age of Onset   Cancer Mother    Hypertension Mother    Heart disease Mother    Depression Mother    Bipolar disorder Mother    Schizophrenia Mother    Alcoholism Mother    Drug abuse Mother    Obesity Mother    Hypertension Father    Diabetes Father     Social History Social History   Tobacco Use   Smoking status: Former    Packs/day: 0.25    Years: 25.00    Pack years: 6.25    Types: Cigarettes   Smokeless tobacco: Never   Tobacco comments:    "used to smoke 1/2 to 1ppd depending on stress"  Vaping Use   Vaping Use: Never used  Substance Use Topics   Alcohol use: Yes    Alcohol/week: 3.0 standard drinks    Types: 3 Glasses of wine per week    Comment: occassional   Drug use: No     Allergies   Hydrocodone and Tylenol [acetaminophen]   Review of Systems Review of Systems  Constitutional:  Negative for chills and fever.  Eyes:  Negative for discharge and redness.  Respiratory:  Negative for chest tightness and shortness of breath.   Cardiovascular:  Positive for palpitations. Negative for chest pain.  Gastrointestinal:  Negative for nausea and vomiting.  Neurological:  Negative for light-headedness.    Physical Exam Triage Vital Signs ED Triage Vitals  Enc Vitals Group     BP 01/11/21 0822 138/84     Pulse Rate 01/11/21 0822 88     Resp 01/11/21 0822 16     Temp 01/11/21 0822 98.7 F (37.1 C)     Temp Source 01/11/21 0822 Oral     SpO2 01/11/21 0822 97 %     Weight --      Height --      Head Circumference --      Peak Flow --      Pain Score 01/11/21 0823 0     Pain Loc --      Pain Edu? --      Excl. in [place]? --     No data found.  Updated Vital Signs BP 138/84 (BP Location: Left  Arm)   Pulse 88   Temp 98.7 F (37.1 C) (Oral)   Resp 16   SpO2 97%       Physical Exam Vitals reviewed.  Constitutional:      General: She is not in acute distress.    Appearance: Normal appearance. She is not ill-appearing, toxic-appearing or diaphoretic.  HENT:     Head: Normocephalic and atraumatic.  Eyes:     Conjunctiva/sclera: Conjunctivae normal.  Cardiovascular:     Rate and Rhythm: Normal rate and regular rhythm.     Heart sounds: Normal heart sounds. No murmur heard. Pulmonary:     Effort: Pulmonary effort is normal. No respiratory distress.     Breath sounds: Normal breath sounds. No wheezing, rhonchi or rales.  Neurological:     Mental Status: She is alert.  Psychiatric:        Mood and Affect: Mood normal.        Behavior: Behavior normal.     UC Treatments / Results  Labs (all labs ordered are listed, but only abnormal results are displayed) Labs Reviewed  CBC WITH DIFFERENTIAL/PLATELET  COMPREHENSIVE METABOLIC PANEL  TSH    EKG- NSR, no concerning findings.   Radiology No results found.  Procedures Procedures (including critical care time)  Medications Ordered in UC Medications - No data to display  Initial Impression / Assessment and Plan / UC Course  I have reviewed the triage vital signs and the nursing notes.  Pertinent labs & imaging results that were available during my care of the patient were reviewed by me and considered in my medical decision making (see chart for details).    No concerning findings on EKG.  Recommended she follow-up with her PCP for possible Holter monitor.  Will order labs today including CBC CMP and TSH.  Recommended follow-up with any further concerns  Final Clinical Impressions(s) / UC Diagnoses   Final diagnoses:  Palpitations     Discharge Instructions      Please follow up with PCP- you may benefit from a Holter Monitor (Zio  Patch) if symptoms persist. Report to ED with any worsening or further concerns.      ED Prescriptions   None    PDMP not reviewed this encounter.   Francene Finders, PA-C 01/11/21 [phone]

## 2021-01-11 NOTE — Discharge Instructions (Addendum)
Please follow up with PCP- you may benefit from a Holter Monitor (Zio Patch) if symptoms persist. Report to ED with any worsening or further concerns.

## 2021-01-11 NOTE — ED Triage Notes (Signed)
Hx of heart palpitations, has been seen for this before. Was in clinicals and had an ekg done on her yesterday with abnormalities. Denies current chest pain, shortness of breath. Mostly concerned about occasional "fluttering" in chest.

## 2021-01-12 LAB — COMPREHENSIVE METABOLIC PANEL
ALT: 17 IU/L (ref 0–32)
AST: 18 IU/L (ref 0–40)
Albumin/Globulin Ratio: 1.4 (ref 1.2–2.2)
Albumin: 4.4 g/dL (ref 3.8–4.8)
Alkaline Phosphatase: 83 IU/L (ref 44–121)
BUN/Creatinine Ratio: 9 (ref 9–23)
BUN: 9 mg/dL (ref 6–24)
Bilirubin Total: 0.4 mg/dL (ref 0.0–1.2)
CO2: 22 mmol/L (ref 20–29)
Calcium: 9.8 mg/dL (ref 8.7–10.2)
Chloride: 101 mmol/L (ref 96–106)
Creatinine, Ser: 1 mg/dL (ref 0.57–1.00)
Globulin, Total: 3.2 g/dL (ref 1.5–4.5)
Glucose: 105 mg/dL — ABNORMAL HIGH (ref 70–99)
Potassium: 4.1 mmol/L (ref 3.5–5.2)
Sodium: 137 mmol/L (ref 134–144)
Total Protein: 7.6 g/dL (ref 6.0–8.5)
eGFR: 71 mL/min/{1.73_m2} (ref >59)

## 2021-01-12 LAB — TSH: TSH: 1.78 u[IU]/mL (ref 0.450–4.500)

## 2021-01-12 LAB — CBC WITH DIFFERENTIAL/PLATELET
Basophils Absolute: 0 10*3/uL (ref 0.0–0.2)
Basos: 0 %
EOS (ABSOLUTE): 0.1 10*3/uL (ref 0.0–0.4)
Eos: 1 %
Hematocrit: 40.2 % (ref 34.0–46.6)
Hemoglobin: 14.1 g/dL (ref 11.1–15.9)
Immature Grans (Abs): 0 10*3/uL (ref 0.0–0.1)
Immature Granulocytes: 0 %
Lymphocytes Absolute: 3.2 10*3/uL — ABNORMAL HIGH (ref 0.7–3.1)
Lymphs: 47 %
MCH: 31.1 pg (ref 26.6–33.0)
MCHC: 35.1 g/dL (ref 31.5–35.7)
MCV: 89 fL (ref 79–97)
Monocytes Absolute: 0.5 10*3/uL (ref 0.1–0.9)
Monocytes: 8 %
Neutrophils Absolute: 3.1 10*3/uL (ref 1.4–7.0)
Neutrophils: 44 %
Platelets: 344 10*3/uL (ref 150–450)
RBC: 4.53 x10E6/uL (ref 3.77–5.28)
RDW: 13.1 % (ref 11.7–15.4)
WBC: 7 10*3/uL (ref 3.4–10.8)

## 2021-01-14 ENCOUNTER — Encounter: Payer: Self-pay | Admitting: Family Medicine

## 2021-01-14 ENCOUNTER — Ambulatory Visit (INDEPENDENT_AMBULATORY_CARE_PROVIDER_SITE_OTHER): Payer: PRIVATE HEALTH INSURANCE | Admitting: Family Medicine

## 2021-01-14 ENCOUNTER — Other Ambulatory Visit: Payer: Self-pay | Admitting: Family Medicine

## 2021-01-14 ENCOUNTER — Other Ambulatory Visit: Payer: Self-pay

## 2021-01-14 ENCOUNTER — Ambulatory Visit (INDEPENDENT_AMBULATORY_CARE_PROVIDER_SITE_OTHER): Payer: 59

## 2021-01-14 VITALS — BP 160/91 | HR 78 | Ht 63.0 in | Wt 256.4 lb

## 2021-01-14 DIAGNOSIS — R002 Palpitations: Secondary | ICD-10-CM

## 2021-01-14 DIAGNOSIS — I10 Essential (primary) hypertension: Secondary | ICD-10-CM | POA: Diagnosis not present

## 2021-01-14 NOTE — Progress Notes (Signed)
    SUBJECTIVE:   CHIEF COMPLAINT / HPI:   Ms. Allington is a 45 yo F who presents for follow of the following.   Palpitations Daily palpitations lasting a few seconds. Feels as if her heart is racing. Does not have chest but does experience shortness of breath. During these episodes she feels dizzy and weak. No history of passing out and denies losing consciousness during episodes. Seen in ED last week and EKG was normal. She would like to have a Holter monitor in addition to a cardiology referral.   Hypertension - Medications: HCTZ 25mg  daily - Compliance: Yes, taking the same medication for years - Would like to discuss BP with cardiologist before changing medication  Declines flu vaccine  PERTINENT  PMH / PSH: Anxiety  OBJECTIVE:   BP (!) 160/91   Pulse 78   Ht 5\' 3"  (1.6 m)   Wt 116.3 kg   LMP 01/11/2021   SpO2 100%   BMI 45.42 kg/m   General: Appears well, no acute distress. Age appropriate. Cardiac: RRR, normal heart sounds, no murmurs Respiratory: CTAB, normal effort Skin: Warm and dry, no rashes noted Neuro: alert and oriented, no focal deficits Psych: normal affect  ASSESSMENT/PLAN:   Palpitations Ongoing for several months. Previously ignored by patient. Sought care in ED 10/14 w/ NSR EKG. Here today to discuss holter monitor and cardiology referral. No currently experiencing palpitations but they occur randomly and intermittently daily. No history of syncope. VSS today, no tachycardia or abnormal heart sounds.  - Holter monitor ordered and cardiology office to call patient to notify where to pick this up from  - Ambulatory referral to Cardiology  Primary hypertension Elevated BP. Asymptomatic. Patient would like to talk with cardiologist first prior to medication changes. Has BP clinic follow up scheduled. ED precautions given.   Gerlene Fee, [place]

## 2021-01-14 NOTE — Progress Notes (Unsigned)
Patient enrolled for [employer] to mail a 3 day ZIO XT monitor to her address on file.

## 2021-01-14 NOTE — Patient Instructions (Signed)
Thank you for coming in today. We discussed palpitations. For this we will set you up with a holter monitor and cardiology referral. Your blood pressure was elevated today and we discussed keeping your follow up for this on the 30th, please let us know if you have any increase in the symptoms we discussed.   Dr. Janus Molder

## 2021-01-15 ENCOUNTER — Telehealth (HOSPITAL_BASED_OUTPATIENT_CLINIC_OR_DEPARTMENT_OTHER): Payer: Self-pay | Admitting: Cardiovascular Disease

## 2021-01-15 NOTE — Telephone Encounter (Signed)
Spoke to patient.She stated she is upset.She did not know she had a appointment with Dr.[place] yesterday.Stated no one told her.Appointment was rescheduled to 11/28.Stated she would like to see Dr.[place] sooner.She has had hypertension since she was 45 yrs old.She was referred by Dr.Cousins.She also has been having palpitations.Stated her PCP ordered a 3 day monitor.Stated she went to a Urgent Care recently and was told her heart is enlarged.She is very anxious and wants discuss with Dr.[place].Appointment scheduled with Dr. 10/28 at 11:40 am.Directions given for [facility] location.Advised to check B/P and pulse daily,bring readings to appointment along with all medications.

## 2021-01-15 NOTE — Telephone Encounter (Signed)
New Message:      Patient would like for Dr Oval Linsey to please give her a call when she have time please. She says she have some questions and concerns that she wants to discuss with Dr Oval Linsey.Marland Kitchen

## 2021-01-16 ENCOUNTER — Ambulatory Visit (HOSPITAL_BASED_OUTPATIENT_CLINIC_OR_DEPARTMENT_OTHER): Payer: PRIVATE HEALTH INSURANCE | Admitting: Cardiovascular Disease

## 2021-01-19 DIAGNOSIS — R002 Palpitations: Secondary | ICD-10-CM

## 2021-01-25 ENCOUNTER — Ambulatory Visit (INDEPENDENT_AMBULATORY_CARE_PROVIDER_SITE_OTHER): Payer: 59 | Admitting: Cardiovascular Disease

## 2021-01-25 ENCOUNTER — Encounter (HOSPITAL_BASED_OUTPATIENT_CLINIC_OR_DEPARTMENT_OTHER): Payer: Self-pay | Admitting: Cardiovascular Disease

## 2021-01-25 ENCOUNTER — Other Ambulatory Visit: Payer: Self-pay

## 2021-01-25 VITALS — BP 162/84 | HR 84 | Ht 63.0 in | Wt 255.9 lb

## 2021-01-25 DIAGNOSIS — I10 Essential (primary) hypertension: Secondary | ICD-10-CM | POA: Diagnosis not present

## 2021-01-25 DIAGNOSIS — R002 Palpitations: Secondary | ICD-10-CM

## 2021-01-25 DIAGNOSIS — R0683 Snoring: Secondary | ICD-10-CM

## 2021-01-25 DIAGNOSIS — R0681 Apnea, not elsewhere classified: Secondary | ICD-10-CM | POA: Diagnosis not present

## 2021-01-25 MED ORDER — AMLODIPINE BESYLATE 2.5 MG PO TABS: 2.5000 mg | ORAL_TABLET | Freq: Every day | ORAL | 3 refills | Status: DC

## 2021-01-25 NOTE — Patient Instructions (Addendum)
Medication Instructions:  START AMLODIPINE 2.5 MG DAILY   *If you need a refill on your cardiac medications before your next appointment, please call your pharmacy*  Lab Work: RENIN/ALDOSTERONE TODAY   If you have labs (blood work) drawn today and your tests are completely normal, you will receive your results only by: [place] (if you have MyChart) OR A paper copy in the mail If you have any lab test that is abnormal or we need to change your treatment, we will call you to review the results.  Testing/Procedures: Your physician has requested that you have a renal artery duplex. During this test, an ultrasound is used to evaluate blood flow to the kidneys. Allow one hour for this exam. Do not eat after midnight the day before and avoid carbonated beverages. Take your medications as you usually do.  [place]   Your physician has requested that you have an echocardiogram. Echocardiography is a painless test that uses sound waves to create images of your heart. It provides your doctor with information about the size and shape of your heart and how well your heart's chambers and valves are working. This procedure takes approximately one hour. There are no restrictions for this procedure.  Follow-Up: At [facility], you and your health needs are our priority.  As part of our continuing mission to provide you with exceptional heart care, we have created designated Provider Care Teams.  These Care Teams include your primary Cardiologist (physician) and Advanced Practice Providers (APPs -  Physician Assistants and Nurse Practitioners) who all work together to provide you with the care you need, when you need it.  We recommend signing up for the patient portal called "MyChart".  Sign up information is provided on this After Visit Summary.  MyChart is used to connect with patients for Virtual Visits (Telemedicine).  Patients are able to view lab/test results, encounter notes, upcoming  appointments, etc.  Non-urgent messages can be sent to your provider as well.   To learn more about what you can do with MyChart, go to [email or web address].    Your next appointment:   3 month(s)  The format for your next appointment:   In Person  Provider:   Skeet Latch, MD

## 2021-01-25 NOTE — Progress Notes (Signed)
Cardiology Office Note:    Date:  01/25/2021   ID:  Claudia Wall, DOB 1975/05/15, MRN [id number]  PCP:  Alen Bleacher, MD   [facility] Providers Cardiologist:  None     Referring MD: Alen Bleacher, MD   No chief complaint on file.   History of Present Illness:    Claudia Wall is a 45 y.o. female with a hx of hypertension, morbid obesity, GERD, PCOS, alcohol abuse, tobacco abuse, anxiety, depression, and PTSD, here for the evaluation of elevated blood pressure and palpitations. She saw Dr. Garwin Brothers 11/2020. At that visit her blood pressure was 132/92 on HCTZ. She presented to Urgent Care 01/11/2021 with episodes of palpitations occurring 5-6 times per week with typical duration of a second. EKG at that time showed normal sinus rhythm. It was recommended she follow-up with her PCP for possible Holter monitor. She then saw Gerlene Fee, DO 01/14/2021 and reported associated shortness of breath, dizziness, and weakness with her palpitations. Claudia Wall requested a Holter monitor and a cardiology referral. At that visit her blood pressure was also elevated at 160/91.  Her palpitations have been occurring since August 2019 or 2020 right after receiving a COVID vaccination. At that time she felt dizziness, shortness of breath, and felt "spaced out." Now her palpitations last for about one second, and occur sporadically when she is at rest or walking. She feels associated dizziness and shortness of breath as well. Also, she reports being dx with hypertension when she was 45 yo. She was initially prescribed doxazosin in her early 20's, but she did not take this. According to her BP log, at home her blood pressure is ranging from the 110-150s/70-90s, and averaging 130s/80s. Normally she checks her blood pressure before bedtime, and takes her antihypertensives in the morning. Currently she is experiencing a throbbing headache that may be attributable to high blood pressure, but notes this is an  atypical symptom for her. At work she walks around the office often. She endorses some baseline exertional shortness of breath, but attributes this to her weight. For years she has experienced LE edema, worse when she travels to the beach. Sometimes her swelling will improve by morning. She endorses snoring and nocturnal dyspnea that seem to have worsened since 2019. Typically she wakes up feeling tired. A prior sleep study did not reveal sleep apnea. While lying down at night, she may have orthopnea depending on her dietary habits earlier that day. Normally she will cook meals at home, often with deli meats. If she orders out, she will opt for a salad instead of fries.  For the past 3 days she has been eating beef. She has diet soda every once in a while, and drinks decaffeinated coffee or green tea. Alcohol consumption is mostly limited to the weekends, usually a couple of shots and a cocktail, or red wine. For pain management, she will take up to 4 Aleve in a day. Tylenol is known to cause pruritis for her. She is not taking meloxicam because it exacerbates her palpitations. She denies any chest pain, syncope, or PND.    Past Medical History:  Diagnosis Date   Alcohol abuse    Anxiety    Back pain    Breast abscess    Constipation    Depression    Gallbladder problem    GERD (gastroesophageal reflux disease)    HTN (hypertension)    Hypertension    Irregular periods/menstrual cycles    Joint pain  Lactose intolerance    Leg edema    Mastitis    Mastitis    h/o right   Obesity    PCOS (polycystic ovarian syndrome)    PTSD (post-traumatic stress disorder)    Swallowing difficulty    Tobacco abuse    Uterine fibroid     Past Surgical History:  Procedure Laterality Date   CHOLECYSTECTOMY  2002   INCISION AND DRAINAGE BREAST ABSCESS      Current Medications: Current Meds  Medication Sig   amLODipine (NORVASC) 2.5 MG tablet Take 1 tablet (2.5 mg total) by mouth daily.   Blood  Pressure Monitoring (ADULT BLOOD PRESSURE CUFF LG) KIT Check your blood pressure once daily. Keep a written log and bring to your next doctor's appointment.   hydrochlorothiazide (HYDRODIURIL) 25 MG tablet Take 1 tablet (25 mg total) by mouth daily.   [DISCONTINUED] LORazepam (ATIVAN) 0.5 MG tablet Take 1 tablet (0.5 mg total) by mouth 2 (two) times daily as needed for anxiety.     Allergies:   Hydrocodone and Tylenol [acetaminophen]   Social History   Socioeconomic History   Marital status: Significant Other    Spouse name: Deforest Hoyles   Number of children: Not on file   Years of education: Not on file   Highest education level: Not on file  Occupational History   Not on file  Tobacco Use   Smoking status: Former    Packs/day: 0.25    Years: 25.00    Pack years: 6.25    Types: Cigarettes   Smokeless tobacco: Never   Tobacco comments:    "used to smoke 1/2 to 1ppd depending on stress"  Vaping Use   Vaping Use: Never used  Substance and Sexual Activity   Alcohol use: Yes    Alcohol/week: 3.0 standard drinks    Types: 3 Glasses of wine per week    Comment: occassional   Drug use: No   Sexual activity: Yes    Partners: Female    Birth control/protection: Other-see comments, None    Comment: for past 21 years  Other Topics Concern   Not on file  Social History Narrative   Is in a same sex relationship x 1 year. Very supportive and loving per pt and partner.    Social Determinants of Health   Financial Resource Strain: Not on file  Food Insecurity: Not on file  Transportation Needs: Not on file  Physical Activity: Not on file  Stress: Not on file  Social Connections: Not on file     Family History: The patient's family history includes Alcoholism in her mother; Bipolar disorder in her mother; Cancer in her mother; Depression in her mother; Diabetes in her father; Drug abuse in her mother; Heart attack in her maternal aunt; Heart disease in her mother; Heart failure  in her maternal grandmother; Hypertension in her father and mother; Obesity in her mother; Schizophrenia in her mother.  ROS:   Please see the history of present illness.    (+) Palpitations (+) Dizziness (+) Shortness of breath (+) Headache (+) Snoring (+) Orthopnea (+) Bilateral LE edema All other systems reviewed and are negative.  EKGs/Labs/Other Studies Reviewed:    The following studies were reviewed today: No prior cardiovascular studies available.  EKG:    01/25/2021: EKG was not ordered today.   Recent Labs: 01/11/2021: ALT 17; BUN 9; Creatinine, Ser 1.00; Hemoglobin 14.1; Platelets 344; Potassium 4.1; Sodium 137; TSH 1.780   Recent Lipid Panel  Component Value Date/Time   CHOL 154 01/07/2019 1654   TRIG 87 01/07/2019 1654   HDL 46 01/07/2019 1654   CHOLHDL 3.3 01/07/2019 1654   CHOLHDL 3.9 Ratio 06/12/2010 1723   VLDL 24 06/12/2010 1723   LDLCALC 92 01/07/2019 1654        Physical Exam:    Wt Readings from Last 3 Encounters:  01/25/21 255 lb 14.4 oz (116.1 kg)  01/14/21 256 lb 6.4 oz (116.3 kg)  10/19/20 254 lb (115.2 kg)     VS:  BP (!) 162/84 (BP Location: Right Arm, Patient Position: Sitting, Cuff Size: Normal)   Pulse 84   Ht '5\' 3"'  (1.6 m)   Wt 255 lb 14.4 oz (116.1 kg)   LMP 01/11/2021   SpO2 99%   BMI 45.33 kg/m  , BMI Body mass index is 45.33 kg/m. GENERAL:  Well appearing HEENT: Pupils equal round and reactive, fundi not visualized, oral mucosa unremarkable NECK:  No jugular venous distention, waveform within normal limits, carotid upstroke brisk and symmetric, no bruits, + thyromegaly LUNGS:  Clear to auscultation bilaterally HEART:  RRR.  PMI not displaced or sustained,S1 and S2 within normal limits, no S3, no S4, no clicks, no rubs, no murmurs ABD:  Flat, positive bowel sounds normal in frequency in pitch, no bruits, no rebound, no guarding, no midline pulsatile mass, no hepatomegaly, no splenomegaly EXT:  2 plus pulses throughout,  no edema, no cyanosis no clubbing SKIN:  No rashes no nodules NEURO:  Cranial nerves II through XII grossly intact, motor grossly intact throughout PSYCH:  Cognitively intact, oriented to person place and time   ASSESSMENT:    1. Essential hypertension   2. Palpitations   3. Snoring   4. Apnea   5. Primary hypertension   6. Morbid obesity ([place])    PLAN:    Palpitations Intermittent palpitations for years.  The first episode occurred after her COVID-19 vaccination.  Episodes last a few seconds at a time.  Happens   Occurs both at rest and when walking and makes her short of breath and dizzy.  Labs have been unremrkable.  She is wearing a monitor at this time.    Primary hypertension Blood pressure is poorly controlled.  It is higher here than it has been at home, but she is not at her goal of less than 130/80 at home either.  We will try adding amlodipine 2.5 mg.  Continue HCTZ 25 mg daily.  She will track her blood pressures and bring to follow-up.  We will also aggressively work lifestyle intervention.  She already has a Higher education careers adviser to [employer].  She is going to work on increasing her exercise to at least 150 minutes a day.  With diet, exercise, and weight loss it is likely that her pressure can be controlled with 1 agent.  Given that she was first diagnosed with hypertension and says she Age (45 years old) it is worth doing a work-up of secondary causes.  Check renin and aldosterone levels.  We will also get renal artery Dopplers.  Given her snoring and apnea is quite likely that she does have sleep apnea.  She was tested for this a few years ago but is gained 15 pounds since then and her partner reports apneic episodes.  We will repeat a sleep study.  She would prefer to have it done at home if possible.  She is already substantially limiting alcohol intake.  She will keep it to under 1-2 drinks  at a time.  She rarely uses NSAIDs.  Morbid obesity ([place]) She is going to work on  increasing her exercise and weight loss.  Screening for Secondary Hypertension:   Causes 01/25/2021  Drugs/Herbals Screened     - Comments limits salt, no caffeine  Renovascular HTN Screened     - Comments Check renal artery Dopplers  Sleep Apnea Screened     - Comments snoring, apnea, daytime somnolence  Thyroid Disease Screened  Hyperaldosteronism Screened     - Comments Check renal and and aldosterone  Pheochromocytoma N/A  Cushing's Syndrome N/A  Hyperparathyroidism Screened  Coarctation of the Aorta Screened     - Comments BP symmetric  Compliance Screened    Relevant Labs/Studies: Basic Labs Latest Ref Rng & Units 01/11/2021 08/23/2020 01/07/2019  Sodium 134 - 144 mmol/L 137 140 138  Potassium 3.5 - 5.2 mmol/L 4.1 3.9 4.3  Creatinine 0.57 - 1.00 mg/dL 1.00 0.88 0.92    Thyroid  Latest Ref Rng & Units 01/11/2021 04/29/2017  TSH 0.450 - 4.500 uIU/mL 1.780 0.889             Renovascular  01/25/2021  Renal Artery [name] Completed Yes             Disposition: FU with Claudia Reames C. Oval Linsey, MD, [facility] in 3 months.  Medication Adjustments/Labs and Tests Ordered: Current medicines are reviewed at length with the patient today.  Concerns regarding medicines are outlined above.   Orders Placed This Encounter  Procedures   Aldosterone + renin activity w/ ratio   ECHOCARDIOGRAM COMPLETE   Home sleep test   VAS US RENAL ARTERY DUPLEX    Meds ordered this encounter  Medications   amLODipine (NORVASC) 2.5 MG tablet    Sig: Take 1 tablet (2.5 mg total) by mouth daily.    Dispense:  180 tablet    Refill:  3     Patient Instructions  Medication Instructions:  START AMLODIPINE 2.5 MG DAILY   *If you need a refill on your cardiac medications before your next appointment, please call your pharmacy*  Lab Work: RENIN/ALDOSTERONE TODAY   If you have labs (blood work) drawn today and your tests are completely normal, you will receive your results only by: [place]  (if you have MyChart) OR A paper copy in the mail If you have any lab test that is abnormal or we need to change your treatment, we will call you to review the results.  Testing/Procedures: Your physician has requested that you have a renal artery duplex. During this test, an ultrasound is used to evaluate blood flow to the kidneys. Allow one hour for this exam. Do not eat after midnight the day before and avoid carbonated beverages. Take your medications as you usually do.  [place]   Your physician has requested that you have an echocardiogram. Echocardiography is a painless test that uses sound waves to create images of your heart. It provides your doctor with information about the size and shape of your heart and how well your heart's chambers and valves are working. This procedure takes approximately one hour. There are no restrictions for this procedure.  Follow-Up: At [facility], you and your health needs are our priority.  As part of our continuing mission to provide you with exceptional heart care, we have created designated Provider Care Teams.  These Care Teams include your primary Cardiologist (physician) and Advanced Practice Providers (APPs -  Physician Assistants and Nurse Practitioners) who all work  together to provide you with the care you need, when you need it.  We recommend signing up for the patient portal called "MyChart".  Sign up information is provided on this After Visit Summary.  MyChart is used to connect with patients for Virtual Visits (Telemedicine).  Patients are able to view lab/test results, encounter notes, upcoming appointments, etc.  Non-urgent messages can be sent to your provider as well.   To learn more about what you can do with MyChart, go to [email or web address].    Your next appointment:   3 month(s)  The format for your next appointment:   In Person  Provider:   Skeet Latch, MD      [facility],acting as a scribe for  Skeet Latch, MD.,have documented all relevant documentation on the behalf of Skeet Latch, MD,as directed by  Skeet Latch, MD while in the presence of Skeet Latch, MD.   I, Tripp Oval Linsey, MD have reviewed all documentation for this visit.  The documentation of the exam, diagnosis, procedures, and orders on 01/25/2021 are all accurate and complete.   Signed, Skeet Latch, MD  01/25/2021 1:25 PM    [facility]

## 2021-01-25 NOTE — Assessment & Plan Note (Addendum)
Blood pressure is poorly controlled.  It is higher here than it has been at home, but she is not at her goal of less than 130/80 at home either.  We will try adding amlodipine 2.5 mg.  Continue HCTZ 25 mg daily.  She will track her blood pressures and bring to follow-up.  We will also aggressively work lifestyle intervention.  She already has a Higher education careers adviser to [employer].  She is going to work on increasing her exercise to at least 150 minutes a day.  With diet, exercise, and weight loss it is likely that her pressure can be controlled with 1 agent.  Given that she was first diagnosed with hypertension and says she Age (45 years old) it is worth doing a work-up of secondary causes.  Check renin and aldosterone levels.  We will also get renal artery Dopplers.  Given her snoring and apnea is quite likely that she does have sleep apnea.  She was tested for this a few years ago but is gained 15 pounds since then and her partner reports apneic episodes.  We will repeat a sleep study.  She would prefer to have it done at home if possible.  She is already substantially limiting alcohol intake.  She will keep it to under 1-2 drinks at a time.  She rarely uses NSAIDs.

## 2021-01-25 NOTE — Assessment & Plan Note (Signed)
Intermittent palpitations for years.  The first episode occurred after her COVID-19 vaccination.  Episodes last a few seconds at a time.  Happens   Occurs both at rest and when walking and makes her short of breath and dizzy.  Labs have been unremrkable.  She is wearing a monitor at this time.

## 2021-01-25 NOTE — Assessment & Plan Note (Signed)
She is going to work on increasing her exercise and weight loss.

## 2021-01-30 LAB — ALDOSTERONE + RENIN ACTIVITY W/ RATIO
ALDOS/RENIN RATIO: 5.5 (ref 0.0–30.0)
ALDOSTERONE: 5.9 ng/dL (ref 0.0–30.0)
Renin: 1.082 ng/mL/hr (ref 0.167–5.380)

## 2021-01-31 ENCOUNTER — Telehealth: Payer: Self-pay | Admitting: *Deleted

## 2021-01-31 NOTE — Telephone Encounter (Signed)
Home sleep study appointment details left on VM.

## 2021-02-06 ENCOUNTER — Other Ambulatory Visit: Payer: Self-pay

## 2021-02-06 ENCOUNTER — Ambulatory Visit (INDEPENDENT_AMBULATORY_CARE_PROVIDER_SITE_OTHER): Payer: 59

## 2021-02-06 DIAGNOSIS — I10 Essential (primary) hypertension: Secondary | ICD-10-CM | POA: Diagnosis not present

## 2021-02-06 DIAGNOSIS — R002 Palpitations: Secondary | ICD-10-CM

## 2021-02-06 LAB — ECHOCARDIOGRAM COMPLETE
AR max vel: 2.13 cm2
AV Area VTI: 2.82 cm2
AV Area mean vel: 2.46 cm2
AV Mean grad: 3 mmHg
AV Peak grad: 7.8 mmHg
Ao pk vel: 1.4 m/s
Area-P 1/2: 3.6 cm2
Calc EF: 60.1 %
S' Lateral: 3.06 cm
Single Plane A2C EF: 59.1 %
Single Plane A4C EF: 62.9 %

## 2021-02-12 ENCOUNTER — Other Ambulatory Visit: Payer: Self-pay

## 2021-02-12 ENCOUNTER — Ambulatory Visit (INDEPENDENT_AMBULATORY_CARE_PROVIDER_SITE_OTHER): Payer: 59 | Admitting: Orthopaedic Surgery

## 2021-02-12 DIAGNOSIS — G5601 Carpal tunnel syndrome, right upper limb: Secondary | ICD-10-CM | POA: Diagnosis not present

## 2021-02-12 DIAGNOSIS — M79641 Pain in right hand: Secondary | ICD-10-CM

## 2021-02-12 MED ORDER — BUPIVACAINE HCL 0.5 % IJ SOLN
1.0000 mL | INTRAMUSCULAR | Status: AC | PRN
  Administered 2021-02-12: 1 mL

## 2021-02-12 MED ORDER — METHYLPREDNISOLONE ACETATE 40 MG/ML IJ SUSP
40.0000 mg | INTRAMUSCULAR | Status: AC | PRN
Start: 2021-02-12 — End: 2021-02-12
  Administered 2021-02-12: 40 mg

## 2021-02-12 MED ORDER — LIDOCAINE HCL 1 % IJ SOLN
1.0000 mL | INTRAMUSCULAR | Status: AC | PRN
  Administered 2021-02-12: 1 mL

## 2021-02-12 NOTE — Progress Notes (Signed)
Office Visit Note   Patient: Claudia Wall           Date of Birth: 1975-06-29           MRN: [id number] Visit Date: 02/12/2021              Requested by: Alen Bleacher, MD [address] [place],  [place] [address] PCP: Alen Bleacher, MD   Assessment & Plan: Visit Diagnoses:  1. Right carpal tunnel syndrome     Plan: Right carpal tunnel injection repeated today.  Patient tolerated well.  We will see her back as needed.  Follow-Up Instructions: No follow-ups on file.   Orders:  No orders of the defined types were placed in this encounter.  No orders of the defined types were placed in this encounter.     Procedures: Hand/UE Inj: R carpal tunnel for carpal tunnel syndrome on 02/12/2021 4:39 PM Indications: pain Details: 25 G needle Medications: 1 mL lidocaine 1 %; 1 mL bupivacaine 0.5 %; 40 mg methylPREDNISolone acetate 40 MG/ML Outcome: tolerated well, no immediate complications Patient was prepped and draped in the usual sterile fashion.      Clinical Data: No additional findings.   Subjective: Chief Complaint  Patient presents with  . Right Hand - Pain    HPI  [name] returns today for right carpal tunnel injection.  She had nerve conduction studies 4 years ago which showed moderate carpal tunnel syndrome.  She is status post a left carpal tunnel release in February 2020.  Review of Systems   Objective: Vital Signs: There were no vitals taken for this visit.  Physical Exam  Ortho Exam  Right hand exam shows no muscle atrophy or focal motor or sensory deficits.  Specialty Comments:  No specialty comments available.  Imaging: No results found.   PMFS History: Patient Active Problem List   Diagnosis Date Noted  . 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  . Anxiety 10/20/2020  . Nasal congestion 04/18/2020  . Cervical dysplasia 06/09/2019  . Uterine polyp 04/20/2019  . Vaginal discomfort 02/17/2019  . Constipation 01/08/2019  . Chronic bilateral low  back pain without sciatica 09/23/2018  . Obesity, Class III, BMI 40-49.9 (morbid obesity) ([place]) 09/23/2018  . Pelvic pain 07/18/2018  . Headache 04/14/2018  . Bilateral carpal tunnel syndrome 02/09/2018  . Acute lumbar myofascial strain 12/29/2017  . Chronic post-traumatic stress disorder (PTSD) 12/29/2017  . Morbid obesity ([place]) 11/13/2017  . Insomnia due to stress 11/09/2017  . Chronic toe pain, left foot 11/09/2017  . RLQ abdominal pain 07/13/2017  . Other hyperlipidemia 05/13/2017  . Vitamin D deficiency 05/13/2017  . Prediabetes 05/13/2017  . Shortness of breath on exertion 04/29/2017  . Enlarged thyroid 04/29/2017  . ETOHism ([place]) 04/29/2017  . PCOS (polycystic ovarian syndrome) 04/29/2017  . Irregular periods/menstrual cycles 02/12/2017  . Vaginal dryness 02/12/2017  . Left carpal tunnel syndrome 07/08/2016  . Carpal tunnel syndrome, right upper limb 07/07/2013  . Breast pain, left 05/12/2012  . Palpitations 10/27/2011  . Dyshidrotic eczema 11/06/2008  . Tobacco use disorder 11/01/2008  . BMI 45.0-49.9, adult ([place]) 02/03/2007  . Primary hypertension 05/28/2006  . GERD (gastroesophageal reflux disease) 05/28/2006   Past Medical History:  Diagnosis Date  . Alcohol abuse   . Anxiety   . Back pain   . Breast abscess   . Constipation   . Depression   . Gallbladder problem   . GERD (gastroesophageal reflux disease)   . HTN (hypertension)   .  Hypertension   . Irregular periods/menstrual cycles   . Joint pain   . Lactose intolerance   . Leg edema   . Mastitis   . Mastitis    h/o right  . Obesity   . PCOS (polycystic ovarian syndrome)   . PTSD (post-traumatic stress disorder)   . Swallowing difficulty   . Tobacco abuse   . Uterine fibroid     Family History  Problem Relation Age of Onset  . Cancer Mother   . Hypertension Mother   . Heart disease Mother   . Depression Mother   . Bipolar disorder Mother   . Schizophrenia Mother   . Alcoholism Mother   .  Drug abuse Mother   . Obesity Mother   . Hypertension Father   . Diabetes Father   . Heart attack Maternal Aunt   . Heart failure Maternal Grandmother     Past Surgical History:  Procedure Laterality Date  . CHOLECYSTECTOMY  2002  . INCISION AND DRAINAGE BREAST ABSCESS     Social History   Occupational History  . Not on file  Tobacco Use  . Smoking status: Former    Packs/day: 0.25    Years: 25.00    Pack years: 6.25    Types: Cigarettes  . Smokeless tobacco: Never  . Tobacco comments:    "used to smoke 1/2 to 1ppd depending on stress"  Vaping Use  . Vaping Use: Never used  Substance and Sexual Activity  . Alcohol use: Yes    Alcohol/week: 3.0 standard drinks    Types: 3 Glasses of wine per week    Comment: occassional  . Drug use: No  . Sexual activity: Yes    Partners: Female    Birth control/protection: Other-see comments, None    Comment: for past 21 years

## 2021-02-14 ENCOUNTER — Telehealth: Payer: Self-pay | Admitting: Family Medicine

## 2021-02-14 NOTE — Telephone Encounter (Signed)
Discussed holter monitor results with patient. Discussed possibility of beta blockers if PVCs become burdensome but would have cardiology weigh in on this prior. She was recently started on amlodipine. Reviewed echo. EF normal.   Gerlene Fee, DO 02/14/2021, 6:13 PM PGY-3, [place]

## 2021-02-25 ENCOUNTER — Ambulatory Visit (HOSPITAL_BASED_OUTPATIENT_CLINIC_OR_DEPARTMENT_OTHER): Payer: PRIVATE HEALTH INSURANCE | Admitting: Cardiovascular Disease

## 2021-02-27 ENCOUNTER — Encounter (HOSPITAL_BASED_OUTPATIENT_CLINIC_OR_DEPARTMENT_OTHER): Payer: PRIVATE HEALTH INSURANCE | Admitting: Cardiovascular Disease

## 2021-02-27 ENCOUNTER — Ambulatory Visit (HOSPITAL_BASED_OUTPATIENT_CLINIC_OR_DEPARTMENT_OTHER): Payer: PRIVATE HEALTH INSURANCE | Admitting: Cardiovascular Disease

## 2021-02-27 ENCOUNTER — Ambulatory Visit (HOSPITAL_BASED_OUTPATIENT_CLINIC_OR_DEPARTMENT_OTHER): Payer: 59 | Attending: Cardiovascular Disease | Admitting: Cardiovascular Disease

## 2021-02-27 ENCOUNTER — Other Ambulatory Visit: Payer: Self-pay

## 2021-02-27 DIAGNOSIS — I10 Essential (primary) hypertension: Secondary | ICD-10-CM | POA: Diagnosis not present

## 2021-02-27 DIAGNOSIS — R002 Palpitations: Secondary | ICD-10-CM | POA: Diagnosis not present

## 2021-02-27 DIAGNOSIS — G4736 Sleep related hypoventilation in conditions classified elsewhere: Secondary | ICD-10-CM | POA: Diagnosis not present

## 2021-02-27 DIAGNOSIS — G4733 Obstructive sleep apnea (adult) (pediatric): Secondary | ICD-10-CM | POA: Diagnosis not present

## 2021-02-27 DIAGNOSIS — R0683 Snoring: Secondary | ICD-10-CM | POA: Diagnosis not present

## 2021-02-27 DIAGNOSIS — R0681 Apnea, not elsewhere classified: Secondary | ICD-10-CM

## 2021-02-27 MED ORDER — HYDROCHLOROTHIAZIDE 25 MG PO TABS: 25.0000 mg | ORAL_TABLET | Freq: Every day | ORAL | 3 refills | Status: DC

## 2021-03-04 ENCOUNTER — Other Ambulatory Visit: Payer: Self-pay | Admitting: Obstetrics & Gynecology

## 2021-03-04 DIAGNOSIS — D259 Leiomyoma of uterus, unspecified: Secondary | ICD-10-CM

## 2021-03-11 ENCOUNTER — Telehealth: Payer: Self-pay | Admitting: *Deleted

## 2021-03-11 NOTE — Telephone Encounter (Signed)
My Chart message sent to pt to schedule appointment. Nuri Larmer Zimmerman Rumple, CMA

## 2021-03-15 ENCOUNTER — Encounter (HOSPITAL_BASED_OUTPATIENT_CLINIC_OR_DEPARTMENT_OTHER): Payer: Self-pay | Admitting: Cardiovascular Disease

## 2021-03-15 NOTE — Procedures (Signed)
° ° ° ° ° °  Patient Name: Claudia Wall, Claudia Wall Date: 02/28/2021 Gender: Female D.O.B: Jan 29, 1976 Age (years): 45 Referring Provider: Skeet Latch Height (inches): 63 Interpreting Physician: Shelva Majestic MD, ABSM Weight (lbs): 255 RPSGT: Gerhard Perches BMI: 45 MRN: [id number] Neck Size: 15.00  CLINICAL INFORMATION Sleep Study Type: HST  Indication for sleep study: Daytime Fatigue, Fatigue, Hypertension, Insomnia, Morbid Obesity, Morning Headaches, Non-refreshing Sleep, Parasomnias, Snoring, Witnesses Apnea / Gasping During Sleep  Epworth Sleepiness Score: 8  Most recent polysomnogram dated 01/20/2018 revealed an AHI of 3/h and RDI of 8/h.  SLEEP STUDY TECHNIQUE A multi-channel overnight portable sleep study was performed. The channels recorded were: nasal airflow, thoracic respiratory movement, and oxygen saturation with a pulse oximetry. Snoring was also monitored.  MEDICATIONS Patient self administered medications include: HCTZ, ADVIL, PROZAC, BENADRYL.  SLEEP ARCHITECTURE Patient was studied for 348.5 minutes. The sleep efficiency was 99.6 % and the patient was supine for 55.2%. The arousal index was 0.0 per hour.  RESPIRATORY PARAMETERS The overall AHI was 24.6 per hour, with a central apnea index of 0 per hour.  The oxygen nadir was 78% during sleep.  CARDIAC DATA Mean heart rate during sleep was 86.4 bpm.  IMPRESSIONS - Moderate obstructive sleep apnea occurred during this study (AHI  24.6/h). There is a positional component with supine sleep AHI 32.7/h versus non-supine sleep AHI 16.6/h. Th severity during REM sleep cannot be assessed onm this home study. - Severe oxygen desaturation to a nadir of 78%. - Patient snored 15.8% during the sleep.  DIAGNOSIS - Obstructive Sleep Apnea (G47.33) - Nocturnal Hypoxemia (G47.36)  RECOMMENDATIONS - Recommend CPAP therapy for treatment of her symptomatic sleep disordered breathing.  If unable to get an in-lab  titration, initate Auto-PAP with EPR of 3 at 7 - 18 cm of water. - Effort should be made to optimize nasal and oropharyngeal patency. - Positional therapy avoiding supine position during sleep. - Avoid alcohol, sedatives and other CNS depressants that may worsen sleep apnea and disrupt normal sleep architecture. - Sleep hygiene should be reviewed to assess factors that may improve sleep quality. - Weight management (BMI45) and regular exercise should be initiated or continued. - Recommend a download and sleep clinic evaluation after one month of therapy.    [Electronically signed] 03/15/2021 11:10 AM  Shelva Majestic MD, [facility], ABSM Diplomate, American Board of Sleep Medicine   NPI: [id number]  [place] PH: [phone]   FX: [phone] [place]

## 2021-03-19 ENCOUNTER — Telehealth: Payer: Self-pay | Admitting: *Deleted

## 2021-03-19 ENCOUNTER — Other Ambulatory Visit: Payer: Self-pay | Admitting: Cardiovascular Disease

## 2021-03-19 DIAGNOSIS — G4736 Sleep related hypoventilation in conditions classified elsewhere: Secondary | ICD-10-CM

## 2021-03-19 DIAGNOSIS — G4733 Obstructive sleep apnea (adult) (pediatric): Secondary | ICD-10-CM

## 2021-03-19 NOTE — Telephone Encounter (Signed)
Patient notified of sleep study results and recommendations. She agrees to proceed with CPAP titration. 

## 2021-03-19 NOTE — Telephone Encounter (Signed)
-----   Message from Troy Sine, MD sent at 03/15/2021 11:15 AM EST ----- Mariann Laster, please notify pt of results and initiate Auto-PAP if unable for in-lab titration

## 2021-04-03 ENCOUNTER — Ambulatory Visit
Admission: RE | Admit: 2021-04-03 | Discharge: 2021-04-03 | Disposition: A | Discharge status: Home / Self Care | Payer: 59 | Source: Ambulatory Visit | Attending: Obstetrics & Gynecology | Admitting: Obstetrics & Gynecology

## 2021-04-03 ENCOUNTER — Encounter: Payer: Self-pay | Admitting: *Deleted

## 2021-04-03 DIAGNOSIS — D259 Leiomyoma of uterus, unspecified: Secondary | ICD-10-CM

## 2021-04-03 HISTORY — PX: IR RADIOLOGIST EVAL & MGMT: IMG5224

## 2021-04-03 NOTE — H&P (Signed)
Chief Complaint: Patient was seen in consultation today for abnormal uterine bleeding at the request of [place]  Referring Physician(s): Mody,Vaishali  History of Present Illness:  Claudia Wall is a 46 y.o. female w PMHx significant for HTN and obesity who is referred for the evaluation of abnormal uterine bleeding. Pt most recently saw Dr. Benjie Karvonen OB GYN and described perimenstrual cramping and heavy flow x3-5days. She reports that her menses have gradually been worsening. ROS was positive for pelvic pressure, constipation and back pain which radiates to her BLE. She attributes her frequent urination to diuretics which she takes as anti-HTNs.  Review of Systems: A 12 point ROS discussed and pertinent positives are indicated in the HPI above.  All other systems are negative.  Past Medical History:  Diagnosis Date   Alcohol abuse    Anxiety    Back pain    Breast abscess    Constipation    Depression    Gallbladder problem    GERD (gastroesophageal reflux disease)    HTN (hypertension)    Hypertension    Irregular periods/menstrual cycles    Joint pain    Lactose intolerance    Leg edema    Mastitis    Mastitis    h/o right   Obesity    PCOS (polycystic ovarian syndrome)    PTSD (post-traumatic stress disorder)    Swallowing difficulty    Tobacco abuse    Uterine fibroid     Past Surgical History:  Procedure Laterality Date   CHOLECYSTECTOMY  2002   INCISION AND DRAINAGE BREAST ABSCESS     IR RADIOLOGIST EVAL & MGMT  04/03/2021    Allergies: Hydrocodone and Tylenol [acetaminophen]  Medications: Prior to Admission medications   Medication Sig Start Date End Date Taking? Authorizing Provider  amLODipine (NORVASC) 2.5 MG tablet Take 1 tablet (2.5 mg total) by mouth daily. 01/25/21 04/25/21  Skeet Latch, MD  Blood Pressure Monitoring (ADULT BLOOD PRESSURE CUFF LG) KIT Check your blood pressure once daily. Keep a written log and bring to your next  doctor's appointment. 08/23/20   Alcus Dad, MD  hydrochlorothiazide (HYDRODIURIL) 25 MG tablet Take 1 tablet (25 mg total) by mouth daily. 02/27/21   Alen Bleacher, MD     Family History  Problem Relation Age of Onset   Cancer Mother    Hypertension Mother    Heart disease Mother    Depression Mother    Bipolar disorder Mother    Schizophrenia Mother    Alcoholism Mother    Drug abuse Mother    Obesity Mother    Hypertension Father    Diabetes Father    Heart attack Maternal Aunt    Heart failure Maternal Grandmother     Social History   Socioeconomic History   Marital status: Significant Other    Spouse name: Deforest Hoyles   Number of children: Not on file   Years of education: Not on file   Highest education level: Not on file  Occupational History   Not on file  Tobacco Use   Smoking status: Former    Packs/day: 0.25    Years: 25.00    Pack years: 6.25    Types: Cigarettes   Smokeless tobacco: Never   Tobacco comments:    "used to smoke 1/2 to 1ppd depending on stress"  Vaping Use   Vaping Use: Never used  Substance and Sexual Activity   Alcohol use: Yes    Alcohol/week: 3.0 standard drinks  Types: 3 Glasses of wine per week    Comment: occassional   Drug use: No   Sexual activity: Yes    Partners: Female    Birth control/protection: Other-see comments, None    Comment: for past 21 years  Other Topics Concern   Not on file  Social History Narrative   Is in a same sex relationship x 1 year. Very supportive and loving per pt and partner.    Social Determinants of Health   Financial Resource Strain: Not on file  Food Insecurity: Not on file  Transportation Needs: Not on file  Physical Activity: Not on file  Stress: Not on file  Social Connections: Not on file    Vital Signs: BP (!) 156/94 (BP Location: Left Arm)    Pulse 94    SpO2 98%   Physical Exam  General: Obese, NAD  CV: RRR Pulm: normal work of breathing on RA Abd: Rotund. S,  NT MSK: Grossly normal Psych: Appropriate affect.    Imaging:  A pelvic US obtained at her OB GYNs office is not available for review, however the report notes a myomatous uterus with largest lesion measuring approximately 8 x 4 cm.  IR Radiologist Eval & Mgmt  Result Date: 04/03/2021 Please refer to notes tab for details about interventional procedure. (Op Note)  SLEEP STUDY DOCUMENTS  Result Date: 03/08/2021 Ordered by an unspecified provider.   Labs:  CBC: Recent Labs    01/11/21 0854  WBC 7.0  HGB 14.1  HCT 40.2  PLT 344    COAGS: No results for input(s): INR, APTT in the last 8760 hours.  BMP: Recent Labs    08/23/20 1653 01/11/21 0854  NA 140 137  K 3.9 4.1  CL 100 101  CO2 24 22  GLUCOSE 78 105*  BUN 10 9  CALCIUM 10.0 9.8  CREATININE 0.88 1.00    LIVER FUNCTION TESTS: Recent Labs    01/11/21 0854  BILITOT 0.4  AST 18  ALT 17  ALKPHOS 83  PROT 7.6  ALBUMIN 4.4     Assessment and Plan:  Ms.Claudia Wall is a 46 y.o. year old female who presents with symptomatic uterine fibroids and dysfunctional menstrual bleeding.  She is interested in pursuing a minimally-invasive option for the treatment of her fibroids at this time, and is curious about Uterine Artery Embolization. She does not desire future pregnancies.  Review of systems is otherwise negative.  She would like to discuss the procedure with her significant other.  I recommended that secondary to her lack of cross-sectional imaging available to evaluate her uterus and fibroid burden enhancement, a contrasted MRI pelvis is warranted.  We will reconvene via a virtual appointment to discuss the MRI results and potentially plan for the procedure at that time.   Thank you for this interesting consult.  I greatly enjoyed meeting Claudia Wall and look forward to participating in their care.  A copy of this report was sent to the requesting provider on this date.  Electronically  Signed:  Michaelle Birks, MD [facility] Radiology   Pager. [phone] Clinic. [phone]  I spent a total of  30 Minutes  in face to face in clinical consultation, greater than 50% of which was counseling/coordinating care for Ms. Settles abnormal uterine bleeding.

## 2021-04-04 ENCOUNTER — Telehealth: Payer: Self-pay | Admitting: *Deleted

## 2021-04-04 ENCOUNTER — Other Ambulatory Visit: Payer: Self-pay | Admitting: Interventional Radiology

## 2021-04-04 DIAGNOSIS — D259 Leiomyoma of uterus, unspecified: Secondary | ICD-10-CM

## 2021-04-04 NOTE — Telephone Encounter (Signed)
Per automated voice system no PA is required for CPAP titration. Patient's appointment has been scheduled for 05/07/21. Message left on patient's voicemail letting her know the appointment has been scheduled. Check MyChart or call me back for details.

## 2021-04-04 NOTE — Telephone Encounter (Signed)
-----   Message from Lauralee Evener, [place] sent at 03/19/2021 12:45 PM EST ----- CPAP titration

## 2021-04-08 ENCOUNTER — Other Ambulatory Visit: Payer: Self-pay | Admitting: Interventional Radiology

## 2021-04-08 DIAGNOSIS — D259 Leiomyoma of uterus, unspecified: Secondary | ICD-10-CM

## 2021-04-11 ENCOUNTER — Other Ambulatory Visit: Payer: Self-pay

## 2021-04-11 ENCOUNTER — Ambulatory Visit (INDEPENDENT_AMBULATORY_CARE_PROVIDER_SITE_OTHER): Payer: 59 | Admitting: Student

## 2021-04-11 ENCOUNTER — Encounter: Payer: Self-pay | Admitting: Student

## 2021-04-11 VITALS — BP 135/95 | HR 86 | Ht 63.0 in | Wt 256.0 lb

## 2021-04-11 DIAGNOSIS — I10 Essential (primary) hypertension: Secondary | ICD-10-CM

## 2021-04-11 MED ORDER — AMLODIPINE BESYLATE 5 MG PO TABS: 5.0000 mg | ORAL_TABLET | Freq: Every day | ORAL | 3 refills | Status: DC

## 2021-04-11 NOTE — Patient Instructions (Addendum)
It was wonderful to meet you today. Thank you for allowing me to be a part of your care. Below is a short summary of what we discussed at your visit today:  Blood pressure today was 135/95.  Increased amlodipine to 5 mg daily.  Follow-up in 4 weeks to review Blood pressure diary and check for leg swelling.  If you have any questions or concerns, please do not hesitate to contact us via phone or MyChart message.   Alen Bleacher, MD [facility]

## 2021-04-11 NOTE — Assessment & Plan Note (Signed)
Patient's blood pressure today was 135/95.  Blood pressure is poorly controlled and patient reports BP at home is usually 130s/90s.  Amlodipine 2.5 mg was recently added to her medications.  Patient reports tolerating medication and denies any lower extremity edema.  She is currently being followed by cardiologist, Dr. Oval Linsey.  We will increase her amlodipine to 5 mg.  Recommend patient takes blood pressure diary.  And to follow-up in a month to review BP diary and medication side effects.  Patient is amenable to plan.

## 2021-04-11 NOTE — Progress Notes (Signed)
° ° °  SUBJECTIVE:   CHIEF COMPLAINT / HPI:   Blood pressure follow-up She presented today for blood pressure follow-up.  Was recently seen by Dr. Oval Linsey cardiologist after abnormal EKG.  Obtained echo showed normal heart squeezing however delayed relaxation.  Cardiology recommended better blood pressure control.  Currently on amlodipine 2.5 mg and HCTZ.  Her blood pressures usually range in the 130s/90s at home. She denies any headaches or leg swelling.  No signs of hypotension.  PERTINENT  PMH / PSH: HTN, PCOS, GERD  OBJECTIVE:   BP (!) 135/95    Pulse 86    Ht 5\' 3"  (1.6 m)    Wt 256 lb (116.1 kg)    LMP 04/10/2021 (Exact Date)    SpO2 100%    BMI 45.35 kg/m    Physical Exam General: Alert, well appearing, NAD Cardiovascular: RRR, No Murmurs, Normal S2/S2 Respiratory: CTAB, No wheezing or Rales Abdomen: No distension or tenderness Extremities: No edema on extremities   Skin: Warm and dry  ASSESSMENT/PLAN:   Primary hypertension Patient's blood pressure today was 135/95.  Blood pressure is poorly controlled and patient reports BP at home is usually 130s/90s.  Amlodipine 2.5 mg was recently added to her medications.  Patient reports tolerating medication and denies any lower extremity edema.  She is currently being followed by cardiologist, Dr. Oval Linsey.  We will increase her amlodipine to 5 mg.  Recommend patient takes blood pressure diary.  And to follow-up in a month to review BP diary and medication side effects.  Patient is amenable to plan.     Alen Bleacher, MD [place]

## 2021-04-26 ENCOUNTER — Other Ambulatory Visit: Payer: Self-pay

## 2021-04-26 ENCOUNTER — Ambulatory Visit
Admission: RE | Admit: 2021-04-26 | Discharge: 2021-04-26 | Disposition: A | Discharge status: Home / Self Care | Payer: 59 | Source: Ambulatory Visit | Attending: Interventional Radiology | Admitting: Interventional Radiology

## 2021-04-26 ENCOUNTER — Encounter: Payer: Self-pay | Admitting: *Deleted

## 2021-04-26 DIAGNOSIS — D259 Leiomyoma of uterus, unspecified: Secondary | ICD-10-CM

## 2021-04-26 HISTORY — PX: IR RADIOLOGIST EVAL & MGMT: IMG5224

## 2021-04-26 NOTE — Progress Notes (Signed)
Vascular and Interventional Radiology  Follow up Telephone Note  Patient: Claudia Wall DOB: [birth year] Medical Record Number: [id number] Note Date/Time: 04/26/21 3:45 PM   Diagnosis: [D25.9]  1. Uterine leiomyoma, unspecified location      I identified myself to the patient and conveyed my credentials to Claudia Wall  Subjective: 46 y.o. year old female with symptomatic uterine fibroids and dysfunctional menstrual bleeding. Pt seen in [facility] in consultation on 04/03/21. Secondary to a lack of cross-sectional imaging and her wanting to discuss it further with her spouse a follow up virtual visit was planned.  Pt received her MR Pelvis in the interim, but refused IV contrast. She is also still undecided on whether or not to proceed with embolization and would like to think about it further.  Imaging reviewed independently, MR Pelvis (04/16/21)  Dominant ~6 cm R fundal IM fibroid. Signal intensity may indicate degeneration, though no contrast to determine enhancement.     Assessment   Plan:  46 y.o. year old female seen in follow up for symptomatic uterine fibroids and dysfunctional menstrual bleeding.  *MR Pelvis reviewed. Sub-optimal evaluation given a lack of contrast to determine enhancement, though adequate from an anatomical perspective. *Pt remains undecided on embolization. Would like to converse with her spouse and GYN further. *No additional testing required if she were to elect for UFE. *Follow up with VIR PRN.  Follow up No follow-ups on file.   Michaelle Birks, MD [facility] Radiology   Pager. [phone] Clinic. [phone]   As part of this Telephone encounter, no in-person exam was conducted.   The patient was physically located in [place] or a state in which I am permitted to provide care. The encounter was reasonable and appropriate under the circumstances given the patient's presentation  at the time.   The patient and/or parent/guardian has been advised of the potential risks and limitations of this mode of treatment (including, but not limited to, the absence of in-person examination) and has agreed to be treated using telemedicine. The patient's/patient's family's questions regarding their request have been answered.   I provided 25 minutes of non-face-to-face time during this encounter.

## 2021-04-29 NOTE — Progress Notes (Signed)
Cardiology Office Note:    Date:  05/01/2021   ID:  Claudia Wall, DOB Sep 09, 1975, MRN [id number]  PCP:  Alen Bleacher, MD   [place] Providers Cardiologist:  None     Referring MD: Alen Bleacher, MD   No chief complaint on file.  History of Present Illness:    Claudia Wall is a 46 y.o. female with a hx of hypertension, morbid obesity, GERD, obstructive sleep apnea, PCOS, alcohol abuse, tobacco abuse, anxiety, depression, and PTSD, here for follow-up. She was initially seen 12/2020 for the evaluation of elevated blood pressure and palpitations. She saw Dr. Garwin Brothers 11/2020. At that visit her blood pressure was 132/92 on HCTZ. She presented to Urgent Care 01/11/2021 with episodes of palpitations occurring 5-6 times per week with typical duration of a second. EKG at that time showed normal sinus rhythm. It was recommended she follow-up with her PCP for possible Holter monitor. She then saw Gerlene Fee, DO 01/14/2021 and reported associated shortness of breath, dizziness, and weakness with her palpitations. Ms. Yglesias requested a Holter monitor and a cardiology referral. At that visit her blood pressure was also elevated at 160/91.  Her palpitations have occurred since August 2019 or 2020 right after receiving a COVID vaccination. At that time she felt dizziness, shortness of breath, and felt "spaced out." Since then, her palpitations have lasted for about one second, and occur sporadically when she is at rest or walking. She had associated dizziness and shortness of breath as well. She is not taking meloxicam because it exacerbates her palpitations. Also, she reported being dx with hypertension when she was 46 yo. She was initially prescribed doxazosin in her early 20's, but she did not take this. A prior sleep study did not reveal sleep apnea. At her last appointment, her blood pressure was elevated so amlodipine was added. She planned to work on diet and exercise. Her renin,  aldosterone, and thyroid were all normal. Her PCP ordered a ambulatory monitor. The 3-day Zio monitor 01/2021 showed sinus rhythm, rare PACs and PVCs, and 13 beats NSVT. Echo revealed LVEF 60-65% and grade 1 diastolic dysfunction. Renal artery dopplers were normal. Sleep study 02/2021 revealed obstructive sleep apnea.   Today, she has been doing well. Her heart flutters have become less frequent. However, she notices the flutters primarily when drinking alcohol. In the past few days, she notices bilateral LE edema. Her blood pressure at home is, on average, 329J systolic and 18A diastolic. She has not been able to exercise because of the weather and her recent job change. 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 For her diet, she cooks at home and avoids adding salt. However, she snacks throughout the evening. She is compliant with her medications. She denies any chest pain, or shortness of breath, lightheadedness, headaches, syncope, orthopnea, PND, or exertional symptoms.  Past Medical History:  Diagnosis Date   Alcohol abuse    Anxiety    Back pain    Breast abscess    Constipation    Depression    Gallbladder problem    GERD (gastroesophageal reflux disease)    HTN (hypertension)    Hypertension    Irregular periods/menstrual cycles    Joint pain    Lactose intolerance    Leg edema    Mastitis    Mastitis    h/o right   Obesity    PCOS (polycystic ovarian syndrome)    PTSD (post-traumatic stress disorder)    Swallowing difficulty  Tobacco abuse    Uterine fibroid     Past Surgical History:  Procedure Laterality Date   CHOLECYSTECTOMY  2002   INCISION AND DRAINAGE BREAST ABSCESS     IR RADIOLOGIST EVAL & MGMT  04/03/2021   IR RADIOLOGIST EVAL & MGMT  04/26/2021    Current Medications: Current Meds  Medication Sig   Blood Pressure Monitoring (ADULT BLOOD PRESSURE CUFF LG) KIT Check your blood pressure once daily. Keep a written log and bring to your next doctor's appointment.    Olmesartan-amLODIPine-HCTZ (TRIBENZOR) 40-5-25 MG TABS Take 1 tablet by mouth daily.   [DISCONTINUED] amLODipine (NORVASC) 5 MG tablet Take 1 tablet (5 mg total) by mouth at bedtime.   [DISCONTINUED] hydrochlorothiazide (HYDRODIURIL) 25 MG tablet Take 1 tablet (25 mg total) by mouth daily.     Allergies:   Hydrocodone and Tylenol [acetaminophen]   Social History   Socioeconomic History   Marital status: Significant Other    Spouse name: Deforest Hoyles   Number of children: Not on file   Years of education: Not on file   Highest education level: Not on file  Occupational History   Not on file  Tobacco Use   Smoking status: Former    Packs/day: 0.25    Years: 25.00    Pack years: 6.25    Types: Cigarettes   Smokeless tobacco: Never   Tobacco comments:    "used to smoke 1/2 to 1ppd depending on stress"  Vaping Use   Vaping Use: Never used  Substance and Sexual Activity   Alcohol use: Yes    Alcohol/week: 3.0 standard drinks    Types: 3 Glasses of wine per week    Comment: occassional   Drug use: No   Sexual activity: Yes    Partners: Female    Birth control/protection: Other-see comments, None    Comment: for past 21 years  Other Topics Concern   Not on file  Social History Narrative   Is in a same sex relationship x 1 year. Very supportive and loving per pt and partner.    Social Determinants of Health   Financial Resource Strain: Not on file  Food Insecurity: Not on file  Transportation Needs: Not on file  Physical Activity: Not on file  Stress: Not on file  Social Connections: Not on file     Family History: The patient's family history includes Alcoholism in her mother; Bipolar disorder in her mother; Cancer in her mother; Depression in her mother; Diabetes in her father; Drug abuse in her mother; Heart attack in her maternal aunt; Heart disease in her mother; Heart failure in her maternal grandmother; Hypertension in her father and mother; Obesity in her  mother; Schizophrenia in her mother.  ROS:   Please see the history of present illness.    (+) Palpitations (+) Bilateral LE edema All other systems reviewed and are negative.  EKGs/Labs/Other Studies Reviewed:    The following studies were reviewed today: Echo 02/06/2021  1. Left ventricular ejection fraction, by estimation, is 60 to 65%. The  left ventricle has normal function. The left ventricle has no regional  wall motion abnormalities. Left ventricular diastolic parameters are  consistent with Grade I diastolic  dysfunction (impaired relaxation). The average left ventricular global  longitudinal strain is -17.9 %. The global longitudinal strain is normal.   2. Right ventricular systolic function is normal. The right ventricular  size is normal. There is normal pulmonary artery systolic pressure.   3. The mitral valve  is normal in structure. No evidence of mitral valve  regurgitation. No evidence of mitral stenosis.   4. The aortic valve is tricuspid. Aortic valve regurgitation is not  visualized. No aortic stenosis is present.   5. The inferior vena cava is normal in size with greater than 50%  respiratory variability, suggesting right atrial pressure of 3 mmHg.   Renal Artery Duplex 02/06/2021 Right: Normal size right kidney. Normal right Resisitive Index.         Normal cortical thickness of right kidney. No evidence of         right renal artery stenosis. RRV flow present.  Left:  Normal size of left kidney. Normal left Resistive Index.         Normal cortical thickness of the left kidney. No evidence of         left renal artery stenosis. LRV flow present.  Mesenteric:  Normal Celiac artery and Superior Mesenteric artery findings.   Monitor 01/30/2021 3 Day Zio Monitor   Quality: Fair.  Baseline artifact. Predominant rhythm: Sinus rhythm Average heart rate: 88 bpm Max heart rate: 156 bpm Min heart rate: 55 bpm Pauses >2.5 seconds: None   13 beats NSVT <1% PACs  and PVCs Patient triggered events corresponded to PVCs,  sinus rhythm, and sinus tachycardia  EKG:   EKG was not ordered today  Recent Labs: 01/11/2021: ALT 17; BUN 9; Creatinine, Ser 1.00; Hemoglobin 14.1; Platelets 344; Potassium 4.1; Sodium 137; TSH 1.780   Recent Lipid Panel    Component Value Date/Time   CHOL 154 01/07/2019 1654   TRIG 87 01/07/2019 1654   HDL 46 01/07/2019 1654   CHOLHDL 3.3 01/07/2019 1654   CHOLHDL 3.9 Ratio 06/12/2010 1723   VLDL 24 06/12/2010 1723   LDLCALC 92 01/07/2019 1654        Physical Exam:    Wt Readings from Last 3 Encounters:  05/01/21 256 lb 8 oz (116.3 kg)  04/11/21 256 lb (116.1 kg)  02/27/21 255 lb (115.7 kg)     VS:  BP 134/86 (BP Location: Right Arm, Patient Position: Sitting, Cuff Size: Large)    Pulse 90    Ht '5\' 3"'  (1.6 m)    Wt 256 lb 8 oz (116.3 kg)    LMP 04/10/2021 (Exact Date)    SpO2 99%    BMI 45.44 kg/m  , BMI Body mass index is 45.44 kg/m. GENERAL:  Well appearing HEENT: Pupils equal round and reactive, fundi not visualized, oral mucosa unremarkable NECK:  No jugular venous distention, waveform within normal limits, carotid upstroke brisk and symmetric, no bruits, + thyromegaly LUNGS:  Clear to auscultation bilaterally HEART:  RRR.  PMI not displaced or sustained,S1 and S2 within normal limits, no S3, no S4, no clicks, no rubs, no murmurs ABD:  Flat, positive bowel sounds normal in frequency in pitch, no bruits, no rebound, no guarding, no midline pulsatile mass, no hepatomegaly, no splenomegaly EXT:  2 plus pulses throughout, no edema, no cyanosis no clubbing SKIN:  No rashes no nodules NEURO:  Cranial nerves II through XII grossly intact, motor grossly intact throughout PSYCH:  Cognitively intact, oriented to person place and time   ASSESSMENT:    1. Essential hypertension   2. Morbid obesity ([place])   3. Primary hypertension   4. Prediabetes   5. Palpitations     PLAN:    Primary hypertension Blood  pressure is much better controlled but still not at goal.  We did discuss the  importance of trying to work on some of the lifestyle interventions that we talked about before.  Recommended exercising at least 150 minutes weekly.  We also discussed limiting her processed foods and salty food intake.  We strategized about cooking more at home and meal prep.  She is going to try and work on these things.  In the meantime, we will switch her amlodipine and hydrochlorothiazide to Tribenzor.  We will start with olmesartan 40 mg, amlodipine 5 mg, and HCTZ 25 mg.  Given that she has noted some increased swelling we will not increase the amlodipine dose.  She had a secondary hypertension causes work-up which has been unremarkable.  She is in the midst of getting scheduled for CPAP titration which may also help.  Prediabetes Ms. [place] has prediabetes.  Her hemoglobin A1c was 5.8% on 09/2020.  She is also struggling with obesity.  We will have her talk with her pharmacist to see if she can be approved for Ozempic which would help with issues.  We also discussed the importance of diet and exercise as above.  Morbid obesity ([place]) Diet and exercise as well as Ozempic as above.  Palpitations Symptoms have improved.  She notes that she is unable to drink alcohol because of the palpitations.  She will continue to abstain.   Screening for Secondary Hypertension:   Causes 01/25/2021 05/01/2021  Drugs/Herbals Screened Screened     - Comments limits salt, no caffeine Needs to limit sodium  Renovascular HTN Screened Screened     - Comments Check renal artery Dopplers Negative on Dopplers  Sleep Apnea Screened Screened     - Comments snoring, apnea, daytime somnolence CPAP titration pening  Thyroid Disease Screened Screened  Hyperaldosteronism Screened Screened     - Comments Check renal and and aldosterone -  Pheochromocytoma N/A N/A  Cushing's Syndrome N/A N/A  Hyperparathyroidism Screened Screened  Coarctation of  the Aorta Screened Screened     - Comments BP symmetric -  Compliance Screened Screened    Relevant Labs/Studies: Basic Labs Latest Ref Rng & Units 01/11/2021 08/23/2020 01/07/2019  Sodium 134 - 144 mmol/L 137 140 138  Potassium 3.5 - 5.2 mmol/L 4.1 3.9 4.3  Creatinine 0.57 - 1.00 mg/dL 1.00 0.88 0.92    Thyroid  Latest Ref Rng & Units 01/11/2021 04/29/2017  TSH 0.450 - 4.500 uIU/mL 1.780 0.889    Renin/Aldosterone  Latest Ref Rng & Units 01/25/2021  Aldosterone 0.0 - 30.0 ng/dL 5.9  Renin 0.167 - 5.380 ng/mL/hr 1.082  Aldos/Renin Ratio 0.0 - 30.0 5.5          Renovascular  02/06/2021  Renal Artery [name] Completed Yes             Disposition: FU with Aleyssa Pike C. Oval Linsey, MD, [facility] in 2 months  Medication Adjustments/Labs and Tests Ordered: Current medicines are reviewed at length with the patient today.  Concerns regarding medicines are outlined above.   Orders Placed This Encounter  Procedures   Basic metabolic panel   AMB Referral to [facility] Pharm-D    Meds ordered this encounter  Medications   Olmesartan-amLODIPine-HCTZ (TRIBENZOR) 40-5-25 MG TABS    Sig: Take 1 tablet by mouth daily.    Dispense:  90 tablet    Refill:  1    D/C AMLODIPINE AND HCTZ     Patient Instructions  Medication Instructions:  STOP HYDROCHLOROTHIAZIDE   STOP AMLODIPINE   START OLMESARTAN-AMLODIPINE-HCTZ 40-5-25 MG DAILY   *If you need a refill on your  cardiac medications before your next appointment, please call your pharmacy*  Lab Work: BMET IN 1 WEEK   If you have labs (blood work) drawn today and your tests are completely normal, you will receive your results only by: [place] (if you have MyChart) OR A paper copy in the mail If you have any lab test that is abnormal or we need to change your treatment, we will call you to review the results.  Testing/Procedures: NONE  Follow-Up: At [facility], you and your health needs are our priority.  As part of our  continuing mission to provide you with exceptional heart care, we have created designated Provider Care Teams.  These Care Teams include your primary Cardiologist (physician) and Advanced Practice Providers (APPs -  Physician Assistants and Nurse Practitioners) who all work together to provide you with the care you need, when you need it.  We recommend signing up for the patient portal called "MyChart".  Sign up information is provided on this After Visit Summary.  MyChart is used to connect with patients for Virtual Visits (Telemedicine).  Patients are able to view lab/test results, encounter notes, upcoming appointments, etc.  Non-urgent messages can be sent to your provider as well.   To learn more about what you can do with MyChart, go to [email or web address].    Your next appointment:    07/03/2021 11:40 VIRTUAL VISIT   WILL HAVE SOMEONE CALL ABOUT YOUR PHARM D APPOINTMENT     I,Mykaella Javier,acting as a scribe for Skeet Latch, MD.,have documented all relevant documentation on the behalf of Skeet Latch, MD,as directed by  Skeet Latch, MD while in the presence of Skeet Latch, MD.  I, Mason Oval Linsey, MD have reviewed all documentation for this visit.  The documentation of the exam, diagnosis, procedures, and orders on 05/01/2021 are all accurate and complete.   Signed, Skeet Latch, MD  05/01/2021 5:51 PM    [facility]

## 2021-04-30 ENCOUNTER — Telehealth (HOSPITAL_BASED_OUTPATIENT_CLINIC_OR_DEPARTMENT_OTHER): Payer: Self-pay | Admitting: Cardiovascular Disease

## 2021-04-30 NOTE — Telephone Encounter (Signed)
Called pt to confirm appointment for tomorrow with Dr. Oval Linsey. Pt stated she just started a new job and does not get off until 5. She stated she will call back to let us know if she can make appt or if she needs to reschedule.

## 2021-05-01 ENCOUNTER — Encounter (HOSPITAL_BASED_OUTPATIENT_CLINIC_OR_DEPARTMENT_OTHER): Payer: Self-pay | Admitting: Cardiovascular Disease

## 2021-05-01 ENCOUNTER — Other Ambulatory Visit: Payer: Self-pay

## 2021-05-01 ENCOUNTER — Ambulatory Visit (HOSPITAL_BASED_OUTPATIENT_CLINIC_OR_DEPARTMENT_OTHER): Payer: 59 | Admitting: Cardiovascular Disease

## 2021-05-01 VITALS — BP 134/86 | HR 90 | Ht 63.0 in | Wt 256.5 lb

## 2021-05-01 DIAGNOSIS — I10 Essential (primary) hypertension: Secondary | ICD-10-CM | POA: Diagnosis not present

## 2021-05-01 DIAGNOSIS — R002 Palpitations: Secondary | ICD-10-CM

## 2021-05-01 DIAGNOSIS — R7303 Prediabetes: Secondary | ICD-10-CM | POA: Diagnosis not present

## 2021-05-01 MED ORDER — OLMESARTAN-AMLODIPINE-HCTZ 40-5-25 MG PO TABS: 1.0000 | ORAL_TABLET | Freq: Every day | ORAL | 1 refills | Status: DC

## 2021-05-01 NOTE — Assessment & Plan Note (Signed)
Symptoms have improved.  She notes that she is unable to drink alcohol because of the palpitations.  She will continue to abstain.

## 2021-05-01 NOTE — Assessment & Plan Note (Signed)
Ms. [place] has prediabetes.  Her hemoglobin A1c was 5.8% on 09/2020.  She is also struggling with obesity.  We will have her talk with her pharmacist to see if she can be approved for Ozempic which would help with issues.  We also discussed the importance of diet and exercise as above.

## 2021-05-01 NOTE — Patient Instructions (Addendum)
Medication Instructions:  STOP HYDROCHLOROTHIAZIDE   STOP AMLODIPINE   START OLMESARTAN-AMLODIPINE-HCTZ 40-5-25 MG DAILY   *If you need a refill on your cardiac medications before your next appointment, please call your pharmacy*  Lab Work: BMET IN 1 WEEK   If you have labs (blood work) drawn today and your tests are completely normal, you will receive your results only by: [place] (if you have MyChart) OR A paper copy in the mail If you have any lab test that is abnormal or we need to change your treatment, we will call you to review the results.  Testing/Procedures: NONE  Follow-Up: At [facility], you and your health needs are our priority.  As part of our continuing mission to provide you with exceptional heart care, we have created designated Provider Care Teams.  These Care Teams include your primary Cardiologist (physician) and Advanced Practice Providers (APPs -  Physician Assistants and Nurse Practitioners) who all work together to provide you with the care you need, when you need it.  We recommend signing up for the patient portal called "MyChart".  Sign up information is provided on this After Visit Summary.  MyChart is used to connect with patients for Virtual Visits (Telemedicine).  Patients are able to view lab/test results, encounter notes, upcoming appointments, etc.  Non-urgent messages can be sent to your provider as well.   To learn more about what you can do with MyChart, go to [email or web address].    Your next appointment:    07/03/2021 11:40 VIRTUAL VISIT   WILL HAVE SOMEONE CALL ABOUT YOUR PHARM D APPOINTMENT

## 2021-05-01 NOTE — Assessment & Plan Note (Addendum)
Blood pressure is much better controlled but still not at goal.  We did discuss the importance of trying to work on some of the lifestyle interventions that we talked about before.  Recommended exercising at least 150 minutes weekly.  We also discussed limiting her processed foods and salty food intake.  We strategized about cooking more at home and meal prep.  She is going to try and work on these things.  In the meantime, we will switch her amlodipine and hydrochlorothiazide to Tribenzor.  We will start with olmesartan 40 mg, amlodipine 5 mg, and HCTZ 25 mg.  Given that she has noted some increased swelling we will not increase the amlodipine dose.  She had a secondary hypertension causes work-up which has been unremarkable.  She is in the midst of getting scheduled for CPAP titration which may also help.

## 2021-05-01 NOTE — Assessment & Plan Note (Signed)
Diet and exercise as well as Ozempic as above.

## 2021-05-06 ENCOUNTER — Telehealth (HOSPITAL_BASED_OUTPATIENT_CLINIC_OR_DEPARTMENT_OTHER): Payer: Self-pay | Admitting: Cardiovascular Disease

## 2021-05-06 NOTE — Telephone Encounter (Signed)
Called to schedule the Pharm D appointment ordered by Dr. Mardella Layman states she will call back to schedule---she just started a new job with [employer] and is not sure when she will be able to get off work.

## 2021-05-07 ENCOUNTER — Ambulatory Visit (HOSPITAL_BASED_OUTPATIENT_CLINIC_OR_DEPARTMENT_OTHER): Payer: 59 | Attending: Cardiovascular Disease | Admitting: Cardiovascular Disease

## 2021-05-07 ENCOUNTER — Other Ambulatory Visit: Payer: Self-pay

## 2021-05-07 DIAGNOSIS — G4736 Sleep related hypoventilation in conditions classified elsewhere: Secondary | ICD-10-CM

## 2021-05-07 DIAGNOSIS — R0902 Hypoxemia: Secondary | ICD-10-CM | POA: Diagnosis not present

## 2021-05-07 DIAGNOSIS — G4733 Obstructive sleep apnea (adult) (pediatric): Secondary | ICD-10-CM | POA: Diagnosis present

## 2021-05-09 ENCOUNTER — Ambulatory Visit: Payer: 59 | Admitting: Student

## 2021-05-22 ENCOUNTER — Encounter (HOSPITAL_BASED_OUTPATIENT_CLINIC_OR_DEPARTMENT_OTHER): Payer: Self-pay | Admitting: Cardiovascular Disease

## 2021-05-22 NOTE — Procedures (Signed)
° ° ° ° °  Patient Name: Claudia Wall, Claudia Wall Date: 05/07/2021 Gender: Female D.O.B: [birth year] Age (years): 45 Referring Provider: Skeet Latch Height (inches): 63 Interpreting Physician: Shelva Majestic MD, ABSM Weight (lbs): 256 RPSGT: Laren Everts BMI: 45 MRN: [id number] Neck Size: 15.50  CLINICAL INFORMATION The patient is referred for a CPAP titration to treat sleep apnea.  Date of HST: 02/27/2021:  AHI 24.6/h; O2 nadir 78%.  SLEEP STUDY TECHNIQUE As per the AASM Manual for the Scoring of Sleep and Associated Events v2.3 (April 2016) with a hypopnea requiring 4% desaturations.  The channels recorded and monitored were frontal, central and occipital EEG, electrooculogram (EOG), submentalis EMG (chin), nasal and oral airflow, thoracic and abdominal wall motion, anterior tibialis EMG, snore microphone, electrocardiogram, and pulse oximetry. Continuous positive airway pressure (CPAP) was initiated at the beginning of the study and titrated to treat sleep-disordered breathing.  MEDICATIONS Medications self-administered by patient taken the night of the study : HCTZ, ADVIL, PROZAC, BENADRYL  TECHNICIAN COMMENTS Comments added by technician: None Comments added by scorer: N/A  RESPIRATORY PARAMETERS Optimal PAP Pressure (cm): 11 AHI at Optimal Pressure (/hr): 0 Overall Minimal O2 (%): 83.0 Supine % at Optimal Pressure (%): 63 Minimal O2 at Optimal Pressure (%): 92.0   SLEEP ARCHITECTURE The study was initiated at 11:16:31 PM and ended at 5:36:10 AM.  Sleep onset time was 18.0 minutes and the sleep efficiency was 89.0%%. The total sleep time was 338 minutes.  The patient spent 6.7%% of the night in stage N1 sleep, 63.9%% in stage N2 sleep, 0.0%% in stage N3 and 29.4% in REM.Stage REM latency was 74.5 minutes  Wake after sleep onset was 23.7. Alpha intrusion was absent. Supine sleep was 52.37%.  CARDIAC DATA The 2 lead EKG demonstrated sinus rhythm. The mean heart  rate was 75.6 beats per minute. Other EKG findings include: None.  LEG MOVEMENT DATA The total Periodic Limb Movements of Sleep (PLMS) were 0. The PLMS index was 0.0. A PLMS index of <15 is considered normal in adults.  IMPRESSIONS - CPAP was initiated at 5 cm and was titrated to optimal PAP pressure at 11 cm of water. AHI 0; O2 nadir 92%, with over one hour of REM sleep at this pressure. - Moderate oxygen desaturations were observed during this titration to a  nadir of 83% at 9 cm. - The patient snored with moderate snoring volume during this titration study. Snoring resolved at 11 cm.  - No cardiac abnormalities were observed during this study. - Clinically significant periodic limb movements were not noted during this study. Arousals associated with PLMs were rare.  DIAGNOSIS - Obstructive Sleep Apnea (G47.33)  RECOMMENDATIONS - Recommend an initial trial of CPAP Auto therapy with EPR at 3 at 10 - 15 cm H2O with heated humidification. A Medium size Fisher&Paykel Full Face Mask F&P Vitera (new) mask was used for the titration. - Avoid alcohol, sedatives and other CNS depressants that may worsen sleep apnea and disrupt normal sleep architecture. - Sleep hygiene should be reviewed to assess factors that may improve sleep quality. - Weight management (BMI45) and regular exercise should be initiated or continued. - Recommend a download and sleep clinic evaluation after 4 weeks of therapy   [Electronically signed] 05/22/2021 02:31 PM  Shelva Majestic MD, [facility], ABSM Diplomate, American Board of Sleep Medicine   NPI: [id number] [place] PH: [phone]   FX: [phone] [place]

## 2021-05-24 ENCOUNTER — Telehealth (HOSPITAL_BASED_OUTPATIENT_CLINIC_OR_DEPARTMENT_OTHER): Payer: Self-pay | Admitting: *Deleted

## 2021-05-24 MED ORDER — HYDROCHLOROTHIAZIDE 25 MG PO TABS: 25.0000 mg | ORAL_TABLET | Freq: Every day | ORAL | 3 refills | Status: DC

## 2021-05-24 MED ORDER — AMLODIPINE BESYLATE 5 MG PO TABS
5.0000 mg | ORAL_TABLET | Freq: Every day | ORAL | 3 refills | Status: DC
  Filled 2021-08-21: qty 90, 90d supply, fill #0

## 2021-05-24 MED ORDER — OLMESARTAN MEDOXOMIL 40 MG PO TABS
40.0000 mg | ORAL_TABLET | Freq: Every day | ORAL | 3 refills | Status: DC
  Filled 2021-08-21: qty 90, 90d supply, fill #0
  Filled 2021-11-19: qty 90, 90d supply, fill #1
  Filled 2022-03-01: qty 90, 90d supply, fill #2

## 2021-05-24 NOTE — Telephone Encounter (Signed)
Received fax from pharmacy requesting patients Tribenzor be changed to each medication individually secondary to cost, patient pays out of pocket  Rx's sent to [facility] as requested

## 2021-05-27 ENCOUNTER — Telehealth: Payer: Self-pay | Admitting: *Deleted

## 2021-05-27 NOTE — Telephone Encounter (Signed)
Left message to return a call to discuss CPAP titration.

## 2021-05-27 NOTE — Telephone Encounter (Signed)
-----   Message from Troy Sine, MD sent at 05/22/2021  2:35 PM EST ----- Mariann Laster, please notify pt and set up with DME for CPAP initiation.

## 2021-05-29 NOTE — Telephone Encounter (Signed)
Called and spoke with patient to schedule Pharm D visit ordered by Dr. Mardella Layman requests we call her with our April schedule since she has just started a new job with [employer] ?

## 2021-06-07 NOTE — Telephone Encounter (Signed)
Left message to return a call to discuss her CPAP titration. ?

## 2021-06-19 ENCOUNTER — Telehealth: Payer: Self-pay

## 2021-06-19 ENCOUNTER — Other Ambulatory Visit: Payer: Self-pay

## 2021-06-19 ENCOUNTER — Ambulatory Visit: Payer: 59 | Admitting: Pharmacist Clinician (PhC)/ Clinical Pharmacy Specialist

## 2021-06-19 ENCOUNTER — Encounter: Payer: Self-pay | Admitting: Pharmacist Clinician (PhC)/ Clinical Pharmacy Specialist

## 2021-06-19 MED ORDER — WEGOVY 0.25 MG/0.5ML ~~LOC~~ SOAJ: 0.2500 mg | SUBCUTANEOUS | 0 refills | Status: DC

## 2021-06-19 MED ORDER — WEGOVY 0.5 MG/0.5ML ~~LOC~~ SOAJ: 0.5000 mg | SUBCUTANEOUS | 0 refills | Status: DC

## 2021-06-19 MED ORDER — WEGOVY 1 MG/0.5ML ~~LOC~~ SOAJ: 1.0000 mg | SUBCUTANEOUS | 0 refills | Status: DC

## 2021-06-19 NOTE — Patient Instructions (Addendum)
We will start process to get Wegovy covered by insurance.   ? ?Week 1-4:  Wegovy 0.25 mg once weekly ?Week 5-8:  Wegovy 0.5 mg once weekly ?Week 9-12:  Wegovy 1 mg once weekly ?Week 13-16: Wegovy 1.7 mg once weekly ? ?If at any time you feel the need to stay on one dose longer than 4 weeks, please just call and let us know.  We will adjust your prescriptions accordingly. ? ?Call Akshith Moncus/Chris at [phone] for any questions or concerns ? ?We'll see you back in 3 months for follow up.  ? ?Thank you for choosing [facility] ? ? ? ?

## 2021-06-19 NOTE — Progress Notes (Signed)
HPI: ?Claudia Wall is a 46 y.o. female patient referred to pharmacy clinic by Dr. Oval Linsey to initiate weight loss therapy with GLP1-RA.  Most recent BMI 45.  She was seen by Dr. Oval Linsey last month for hypertension and has been doing better with new regimen.  Patient notes has spent many years trying to lose weight, but never with much success.  Recent labs show that her A1c is up to 5.8, putting her in the pre-diabetic range.  She would like to work on lifestyle changes, including weight loss, in hopes of preventing diabetes and other long term heath issues.  In addition to trying Weight Watchers, Atkins and other diets, she did go to [facility], but had to stop due to cost of visits and scheduling problems. ? ?Significant medical history: ?Hypertension On olmesartan, hctz and amlodipine - followed by Dr. Oval Linsey  ?hyperlipidemia 10/20: LDL 92 on no medications  ?Pre-diabetes 7/22 A1c 5.8 (unchanged from 2019)  ?Vit D deficiency 6/21 24  ?OSA Working to get CPAP  ? ? ?Current weight management medications: none ? ?Previously tried meds: no weight loss medications ? ?Current meds that may affect weight: none ? ?Baseline weight/BMI: 115.1 kg/44.96 ? ?Insurance payor: [employer] ? ?Diet: mix of home and eating out ?-Breakfast:banana and pop-tart for breakfast ?-Lunch: - tends to eat out while at work - fast food close to office ?-Dinner: - mostly home - chicken, beef, [place]; salad most nights ?-Snacks: - some sweets ?-Drinks: - coffee only occasionally; occasional diet soda ? ?Exercise: walk at work ? ?Family History: Grandmother with heart disease - MI, stent, grandmother's sister also had MI; mother with hypertension but otherwise healthy; father unknown, no siblings; daughter had preeclampsia fine now, grandson 59 months old ? ?Confirmed patient not pregnant and no personal or family history of medullary thyroid carcinoma (MTC) or Multiple Endocrine Neoplasia syndrome type 2 (MEN 2).  ? ?Social  History: former smoker, does drink occasional alcohol ? ?Labs: ?Lab Results  ?Component Value Date  ? HGBA1C 5.8 10/19/2020  ? ? ?Wt Readings from Last 1 Encounters:  ?06/19/21 253 lb 12.8 oz (115.1 kg)  ? ? ?BP Readings from Last 1 Encounters:  ?06/19/21 138/80  ? ?Pulse Readings from Last 1 Encounters:  ?06/19/21 96  ? ? ?   ?Component Value Date/Time  ? CHOL 154 01/07/2019 1654  ? TRIG 87 01/07/2019 1654  ? HDL 46 01/07/2019 1654  ? CHOLHDL 3.3 01/07/2019 1654  ? CHOLHDL 3.9 Ratio 06/12/2010 1723  ? VLDL 24 06/12/2010 1723  ? [place] 92 01/07/2019 1654  ? ? ?Past Medical History:  ?Diagnosis Date  ? Alcohol abuse   ? Anxiety   ? Back pain   ? Breast abscess   ? Constipation   ? Depression   ? Gallbladder problem   ? GERD (gastroesophageal reflux disease)   ? HTN (hypertension)   ? Hypertension   ? Irregular periods/menstrual cycles   ? Joint pain   ? Lactose intolerance   ? Leg edema   ? Mastitis   ? Mastitis   ? h/o right  ? Obesity   ? PCOS (polycystic ovarian syndrome)   ? PTSD (post-traumatic stress disorder)   ? Swallowing difficulty   ? Tobacco abuse   ? Uterine fibroid   ? ? ?Current Outpatient Medications on File Prior to Visit  ?Medication Sig Dispense Refill  ? amLODipine (NORVASC) 5 MG tablet Take 1 tablet (5 mg total) by mouth daily. 90 tablet 3  ?  Blood Pressure Monitoring (ADULT BLOOD PRESSURE CUFF LG) KIT Check your blood pressure once daily. Keep a written log and bring to your next doctor's appointment. 1 kit 0  ? hydrochlorothiazide (HYDRODIURIL) 25 MG tablet Take 1 tablet (25 mg total) by mouth daily. 90 tablet 3  ? olmesartan (BENICAR) 40 MG tablet Take 1 tablet (40 mg total) by mouth daily. 90 tablet 3  ? traMADol (ULTRAM) 50 MG tablet Take 1 tablet by mouth as needed.    ? ?No current facility-administered medications on file prior to visit.  ? ? ?Allergies  ?Allergen Reactions  ? Hydrocodone   ? Tylenol [Acetaminophen]   ? ? ?Obesity, Class III, BMI 40-49.9 (morbid obesity) ([place]) ? ?Patient  has not met goal of at least 5% of body weight loss with comprehensive lifestyle modifications alone in the past 3-6 months. Pharmacotherapy is appropriate to pursue as augmentation. Will start Wegovy  ? ?Confirmed patient not pregnant and no personal or family history of medullary thyroid carcinoma (MTC) or Multiple Endocrine Neoplasia syndrome type 2 (MEN 2).  ? ?Advised patient on common side effects including nausea, diarrhea, dyspepsia, decreased appetite, and fatigue. Counseled patient on reducing meal size and how to titrate medication to minimize side effects. Patient aware to call if intolerable side effects or if experiencing dehydration, abdominal pain, or dizziness. Patient will adhere to dietary modifications and will target at least 150 minutes of moderate intensity exercise weekly.   ? ?Injection technique reviewed at today's visit. ? ?Titration Plan:  ?Will plan to follow the titration plan as below, pending patient is tolerating each dose before increasing to the next. Can slow titration if needed for tolerability.  ?  ?Weeks 1-4: Inject 0.25 mg SQ once weekly x 4 weeks ?Weeks 5-8: Inject 0.5 mg SQ once weekly x 4 weeks ?Weeks 9-12: Inject 1 mg SQ once weekly x 4 weeks ?Weeks 13-16: Inject 1.7 mg SQ once weekly  ? ?Follow up in 3 months. ? ? ?Tommy Medal PharmD CPP [facility] ?[facility] ?[address] ?[place], [place] [address] ? ?

## 2021-06-19 NOTE — Assessment & Plan Note (Signed)
?  Patient has not met goal of at least 5% of body weight loss with comprehensive lifestyle modifications alone in the past 3-6 months. Pharmacotherapy is appropriate to pursue as augmentation. Will start Wegovy  ? ?Confirmed patient not pregnant and no personal or family history of medullary thyroid carcinoma (MTC) or Multiple Endocrine Neoplasia syndrome type 2 (MEN 2).  ? ?Advised patient on common side effects including nausea, diarrhea, dyspepsia, decreased appetite, and fatigue. Counseled patient on reducing meal size and how to titrate medication to minimize side effects. Patient aware to call if intolerable side effects or if experiencing dehydration, abdominal pain, or dizziness. Patient will adhere to dietary modifications and will target at least 150 minutes of moderate intensity exercise weekly.   ? ?Injection technique reviewed at today's visit. ? ?Titration Plan:  ?Will plan to follow the titration plan as below, pending patient is tolerating each dose before increasing to the next. Can slow titration if needed for tolerability.  ?  ?Weeks 1-4: Inject 0.25 mg SQ once weekly x 4 weeks ?Weeks 5-8: Inject 0.5 mg SQ once weekly x 4 weeks ?Weeks 9-12: Inject 1 mg SQ once weekly x 4 weeks ?Weeks 13-16: Inject 1.7 mg SQ once weekly  ? ?Follow up in 3 months. ?

## 2021-06-19 NOTE — Telephone Encounter (Signed)
Called and lmom pt that they were approved for the wegovy and rx sent she was given a sample today during her office visit of the 0.'25mg'$  qw for 4 weeks. Rx for 0.5 and '1mg'$  wegovy sent to pharmacy for future fills once completed 0.'25mg'$ .  ?

## 2021-06-28 ENCOUNTER — Telehealth: Payer: Self-pay | Admitting: *Deleted

## 2021-06-28 NOTE — Telephone Encounter (Signed)
CPAP order sent to [facility] via [facility] portal. ?

## 2021-06-28 NOTE — Telephone Encounter (Signed)
Patient notified that her CPAP machine has been ordered. 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 ?

## 2021-07-03 ENCOUNTER — Telehealth (INDEPENDENT_AMBULATORY_CARE_PROVIDER_SITE_OTHER): Payer: 59 | Admitting: Cardiovascular Disease

## 2021-07-03 ENCOUNTER — Encounter (HOSPITAL_BASED_OUTPATIENT_CLINIC_OR_DEPARTMENT_OTHER): Payer: Self-pay | Admitting: Cardiovascular Disease

## 2021-07-03 ENCOUNTER — Encounter (HOSPITAL_BASED_OUTPATIENT_CLINIC_OR_DEPARTMENT_OTHER): Payer: Self-pay

## 2021-07-03 DIAGNOSIS — G4733 Obstructive sleep apnea (adult) (pediatric): Secondary | ICD-10-CM

## 2021-07-03 DIAGNOSIS — I10 Essential (primary) hypertension: Secondary | ICD-10-CM

## 2021-07-03 DIAGNOSIS — Z6841 Body Mass Index (BMI) 40.0 and over, adult: Secondary | ICD-10-CM

## 2021-07-03 NOTE — Assessment & Plan Note (Signed)
She was recently started on Wegovy.  She will continue with this, diet, and exercise. ?

## 2021-07-03 NOTE — Patient Instructions (Signed)
Medication Instructions:  ?START THE WEGOVY 0.5 INJECTIONS WHEN YOU PICK UP FROM THE HOSPITAL  ? ?*If you need a refill on your cardiac medications before your next appointment, please call your pharmacy* ? ?Lab Work: ?BMET Friday  ? ?If you have labs (blood work) drawn today and your tests are completely normal, you will receive your results only by: ?MyChart Message (if you have MyChart) OR ?A paper copy in the mail ?If you have any lab test that is abnormal or we need to change your treatment, we will call you to review the results. ? ?Testing/Procedures: ?NONE ? ?Follow-Up: ?At [facility], you and your health needs are our priority.  As part of our continuing mission to provide you with exceptional heart care, we have created designated Provider Care Teams.  These Care Teams include your primary Cardiologist (physician) and Advanced Practice Providers (APPs -  Physician Assistants and Nurse Practitioners) who all work together to provide you with the care you need, when you need it. ? ?We recommend signing up for the patient portal called "MyChart".  Sign up information is provided on this After Visit Summary.  MyChart is used to connect with patients for Virtual Visits (Telemedicine).  Patients are able to view lab/test results, encounter notes, upcoming appointments, etc.  Non-urgent messages can be sent to your provider as well.   ?To learn more about what you can do with MyChart, go to [email or web address].   ? ?Your next appointment:   ?11/01/2021 AT 4:20 PM WITH DR [place] Allis  ? ? ?

## 2021-07-03 NOTE — Assessment & Plan Note (Addendum)
Blood pressures have been much better controlled on her current regimen.  It was higher today than usual. Continue amlodipine, hydrochlorothiazide, and olmesartan for now.  Keep working on diet and exercise. ?

## 2021-07-03 NOTE — Assessment & Plan Note (Signed)
Continue working on getting a CPAP. ?

## 2021-07-03 NOTE — Progress Notes (Signed)
?Cardiology Office Note:   ? ?Date:  07/03/2021  ? ?ID:  Claudia Wall, DOB 04-Aug-1975, MRN [id number] ? ?PCP:  Alen Bleacher, MD ?  ?[facility] Providers ?Cardiologist:  None    ? ?Referring MD: Alen Bleacher, MD  ? ?No chief complaint on file. ? ?History of Present Illness:   ? ?Claudia Wall is a 46 y.o. female with a hx of hypertension, morbid obesity, GERD, obstructive sleep apnea, PCOS, alcohol abuse, tobacco abuse, anxiety, depression, and PTSD, here for follow-up. She was initially seen 12/2020 for the evaluation of elevated blood pressure and palpitations. She saw Dr. Garwin Brothers 11/2020. At that visit her blood pressure was 132/92 on HCTZ. She presented to Urgent Care 01/11/2021 with episodes of palpitations occurring 5-6 times per week with typical duration of a second. EKG at that time showed normal sinus rhythm. It was recommended she follow-up with her PCP for possible Holter monitor. She then saw Gerlene Fee, DO 01/14/2021 and reported associated shortness of breath, dizziness, and weakness with her palpitations. [name] requested a Holter monitor and a cardiology referral. At that visit her blood pressure was also elevated at 160/91. ? ?Her palpitations have occurred since August 2019 or 2020 right after receiving a COVID vaccination. At that time she felt dizziness, shortness of breath, and felt "spaced out." Since then, her palpitations have lasted for about one second, and occur sporadically when she is at rest or walking. She had associated dizziness and shortness of breath as well. She is not taking meloxicam because it exacerbates her palpitations. Also, she reported being dx with hypertension when she was 46 yo. She was initially prescribed doxazosin in her early 20's, but she did not take this. A prior sleep study did not reveal sleep apnea. At her last appointment, her blood pressure was elevated so amlodipine was added. She planned to work on diet and exercise. Her renin,  aldosterone, and thyroid were all normal. Her PCP ordered a ambulatory monitor. The 3-day Zio monitor 01/2021 showed sinus rhythm, rare PACs and PVCs, and 13 beats NSVT. Echo revealed LVEF 60-65% and grade 1 diastolic dysfunction. Renal artery dopplers were normal. Sleep study 02/2021 revealed obstructive sleep apnea.  ? ?At her last appointment, her palpitations had improved and just occurred after she drank alcohol. Her blood pressure was improving but not at goal so she was started on tribenzor . She was restarted on CPAP. She saw our pharmacist and was started  on wegovy 06/20/2021.  ? ?She will be seen on a tele-conferencing visit and has been advised of the limitations. ? ?Today, she reports she is doing well.  She reports her blood pressure was high today when she checked. Notes there was a day it was 109/52 and she was very tired that day. However, it des not happen often. She checks her blood pressure at home and systolic ranges around 226'J. She exercises by walking around a lot at work. She notes that since she started [facility], there was a loss of appetite for the first 2 days but her appetite has returned. ? ?She denies chest pain, shortness of breath, palpitations, lightheadedness, headaches, syncope, LE edema, orthopnea, PND.  ?Past Medical History:  ?Diagnosis Date  ? Alcohol abuse   ? Anxiety   ? Back pain   ? Breast abscess   ? Constipation   ? Depression   ? Gallbladder problem   ? GERD (gastroesophageal reflux disease)   ? HTN (hypertension)   ? Hypertension   ?  Irregular periods/menstrual cycles   ? Joint pain   ? Lactose intolerance   ? Leg edema   ? Mastitis   ? Mastitis   ? h/o right  ? Obesity   ? PCOS (polycystic ovarian syndrome)   ? PTSD (post-traumatic stress disorder)   ? Swallowing difficulty   ? Tobacco abuse   ? Uterine fibroid   ? ? ?Past Surgical History:  ?Procedure Laterality Date  ? CHOLECYSTECTOMY  2002  ? INCISION AND DRAINAGE BREAST ABSCESS    ? IR RADIOLOGIST EVAL & MGMT   04/03/2021  ? IR RADIOLOGIST EVAL & MGMT  04/26/2021  ? ? ?Current Medications: ?Current Meds  ?Medication Sig  ? amLODipine (NORVASC) 5 MG tablet Take 1 tablet (5 mg total) by mouth daily.  ? hydrochlorothiazide (HYDRODIURIL) 25 MG tablet Take 1 tablet (25 mg total) by mouth daily.  ? olmesartan (BENICAR) 40 MG tablet Take 1 tablet (40 mg total) by mouth daily.  ? Semaglutide-Weight Management (WEGOVY) 0.25 MG/0.5ML SOAJ Inject 0.25 mg into the skin every 7 (seven) days.  ? traMADol (ULTRAM) 50 MG tablet Take 1 tablet by mouth as needed.  ?  ? ?Allergies:   Hydrocodone and Tylenol [acetaminophen]  ? ?Social History  ? ?Socioeconomic History  ? Marital status: Significant Other  ?  Spouse name: Deforest Hoyles  ? Number of children: Not on file  ? Years of education: Not on file  ? Highest education level: Not on file  ?Occupational History  ? Not on file  ?Tobacco Use  ? Smoking status: Former  ?  Packs/day: 0.25  ?  Years: 25.00  ?  Pack years: 6.25  ?  Types: Cigarettes  ? Smokeless tobacco: Never  ? Tobacco comments:  ?  "used to smoke 1/2 to 1ppd depending on stress"  ?Vaping Use  ? Vaping Use: Never used  ?Substance and Sexual Activity  ? Alcohol use: Yes  ?  Alcohol/week: 3.0 standard drinks  ?  Types: 3 Glasses of wine per week  ?  Comment: occassional  ? Drug use: No  ? Sexual activity: Yes  ?  Partners: Female  ?  Birth control/protection: Other-see comments, None  ?  Comment: for past 21 years  ?Other Topics Concern  ? Not on file  ?Social History Narrative  ? Is in a same sex relationship x 1 year. Very supportive and loving per pt and partner.   ? ?Social Determinants of Health  ? ?Financial Resource Strain: Not on file  ?Food Insecurity: Not on file  ?Transportation Needs: Not on file  ?Physical Activity: Not on file  ?Stress: Not on file  ?Social Connections: Not on file  ?  ? ?Family History: ?The patient's family history includes Alcoholism in her mother; Bipolar disorder in her mother; Cancer in her  mother; Depression in her mother; Diabetes in her father; Drug abuse in her mother; Heart attack in her maternal aunt; Heart disease in her mother; Heart failure in her maternal grandmother; Hypertension in her father and mother; Obesity in her mother; Schizophrenia in her mother. ? ?ROS:   ?Please see the history of present illness. ?All other systems reviewed and are negative. ? ?EKGs/Labs/Other Studies Reviewed:   ? ?The following studies were reviewed today: ?Echo 02/06/2021 ? 1. Left ventricular ejection fraction, by estimation, is 60 to 65%. The left ventricle has normal function. The left ventricle has no regional wall motion abnormalities. Left ventricular diastolic parameters are consistent with Grade I diastolic  ?dysfunction (  impaired relaxation). The average left ventricular global longitudinal strain is -17.9 %. The global longitudinal strain is normal.  ? 2. Right ventricular systolic function is normal. The right ventricular  ?size is normal. There is normal pulmonary artery systolic pressure.  ? 3. The mitral valve is normal in structure. No evidence of mitral valve  ?regurgitation. No evidence of mitral stenosis.  ? 4. The aortic valve is tricuspid. Aortic valve regurgitation is not  ?visualized. No aortic stenosis is present.  ? 5. The inferior vena cava is normal in size with greater than 50%  ?respiratory variability, suggesting right atrial pressure of 3 mmHg.  ? ?Renal Artery Duplex 02/06/2021 ?Right: Normal size right kidney. Normal right Resisitive Index.  ?       Normal cortical thickness of right kidney. No evidence of  ?       right renal artery stenosis. RRV flow present.  ?Left:  Normal size of left kidney. Normal left Resistive Index.  ?       Normal cortical thickness of the left kidney. No evidence of  ?       left renal artery stenosis. LRV flow present.  ?Mesenteric:  ?Normal Celiac artery and Superior Mesenteric artery findings.  ? ?Monitor 01/30/2021 ?3 Day Zio Monitor ?  ?Quality:  Fair.  Baseline artifact. ?Predominant rhythm: Sinus rhythm ?Average heart rate: 88 bpm ?Max heart rate: 156 bpm ?Min heart rate: 55 bpm ?Pauses >2.5 seconds: None ?  ?13 beats NSVT ?<1% PACs and PVCs ?Patient triggere

## 2021-07-06 LAB — BASIC METABOLIC PANEL
BUN/Creatinine Ratio: 14 (ref 9–23)
BUN: 15 mg/dL (ref 6–24)
CO2: 22 mmol/L (ref 20–29)
Calcium: 11.1 mg/dL — ABNORMAL HIGH (ref 8.7–10.2)
Chloride: 98 mmol/L (ref 96–106)
Creatinine, Ser: 1.08 mg/dL — ABNORMAL HIGH (ref 0.57–1.00)
Glucose: 115 mg/dL — ABNORMAL HIGH (ref 70–99)
Potassium: 4.4 mmol/L (ref 3.5–5.2)
Sodium: 133 mmol/L — ABNORMAL LOW (ref 134–144)
eGFR: 65 mL/min/{1.73_m2} (ref >59)

## 2021-07-12 ENCOUNTER — Telehealth: Payer: Self-pay | Admitting: *Deleted

## 2021-07-12 NOTE — Telephone Encounter (Signed)
Received a message via [facility] portal from [facility] that when the patient was contacted by their respiratory department to set patient up on CPAP she requested that the order be canceled for now. Stated that she will call back when she is ready to schedule.Dr Oval Linsey will be notified of the cancellation. ?

## 2021-07-15 ENCOUNTER — Telehealth (HOSPITAL_BASED_OUTPATIENT_CLINIC_OR_DEPARTMENT_OTHER): Payer: Self-pay | Admitting: *Deleted

## 2021-07-15 DIAGNOSIS — I10 Essential (primary) hypertension: Secondary | ICD-10-CM

## 2021-07-15 DIAGNOSIS — Z5181 Encounter for therapeutic drug level monitoring: Secondary | ICD-10-CM

## 2021-07-15 NOTE — Telephone Encounter (Signed)
-----   Message from Skeet Latch, MD sent at 07/14/2021  9:15 AM EDT ----- ?Kidney function very mildly abnormal and calcium is high.  This is probably because of the HCTZ.  Recommend switching HCTZ to spironolactone '25mg'$ .  Repeat BMP 1-2 weeks after switching.   ?

## 2021-07-15 NOTE — Telephone Encounter (Signed)
Left message to call back  

## 2021-07-18 MED ORDER — SPIRONOLACTONE 25 MG PO TABS
25.0000 mg | ORAL_TABLET | Freq: Every day | ORAL | 5 refills | Status: DC
  Filled 2021-08-21: qty 30, 30d supply, fill #0
  Filled 2021-10-09: qty 30, 30d supply, fill #1
  Filled 2021-11-19: qty 30, 30d supply, fill #2
  Filled 2021-11-20: qty 90, 90d supply, fill #2

## 2021-07-18 NOTE — Telephone Encounter (Signed)
Advised patient of lab results and medication change  ?Patient will have labs done at her work, lab order placed in [place]  ?

## 2021-07-18 NOTE — Addendum Note (Signed)
Addended by: Alvina Filbert B on: 07/18/2021 05:13 PM ? ? Modules accepted: Orders ? ?

## 2021-08-06 ENCOUNTER — Telehealth: Payer: Self-pay | Admitting: Cardiovascular Disease

## 2021-08-06 NOTE — Telephone Encounter (Signed)
Pt c/o BP issue: STAT if pt c/o blurred vision, one-sided weakness or slurred speech ? ?1. What are your last 5 BP readings?  ?99/66 yesterday morning ?120/90 at work ?120/80 this morning  ? ?2. Are you having any other symptoms (ex. Dizziness, headache, blurred vision, passed out)? Feeling very tired, in the morning she states she feels hot.  ? ?3. What is your BP issue? BP running low.  Her heart rate is also running high she states when she wakes up in the morning her HR the past two days have been high today she was laying in the bed it was 107.   ? ?  ?

## 2021-08-06 NOTE — Telephone Encounter (Signed)
Spoke with patient regarding blood pressure and how she has been feeling  ? ?Stated this am she woke up and heart felt like it was "thumping", counted and HR was 107 ?Felt like it was beating fast yesterday  ?She has been waking up the last several days feeling tired and like she just can not get up and go ?Blood pressure yesterday morning was 99/66 ?Last night she did not take her Amlodipine or HCTZ (did not start Aldactone yet), only took Olmesartan  ?Blood pressure this am was 127/80 ? ?Stated she has not taken her HCTZ for the last several days ?All meds are taken at night when she gets off work ? ?Stated she just does not feel good/right ? ?She just took her 2nd dose of the [facility] 5 mg and is wondering if this could be causing  ? ?Will forward to Dr Oval Linsey for review  ?

## 2021-08-07 ENCOUNTER — Other Ambulatory Visit (HOSPITAL_COMMUNITY): Payer: Self-pay

## 2021-08-07 ENCOUNTER — Ambulatory Visit (INDEPENDENT_AMBULATORY_CARE_PROVIDER_SITE_OTHER): Payer: 59

## 2021-08-07 ENCOUNTER — Encounter: Payer: Self-pay | Admitting: Orthopaedic Surgery

## 2021-08-07 ENCOUNTER — Ambulatory Visit: Payer: 59 | Admitting: Orthopaedic Surgery

## 2021-08-07 DIAGNOSIS — M25552 Pain in left hip: Secondary | ICD-10-CM

## 2021-08-07 DIAGNOSIS — M25551 Pain in right hip: Secondary | ICD-10-CM

## 2021-08-07 DIAGNOSIS — G8929 Other chronic pain: Secondary | ICD-10-CM | POA: Diagnosis not present

## 2021-08-07 DIAGNOSIS — M5442 Lumbago with sciatica, left side: Secondary | ICD-10-CM | POA: Diagnosis not present

## 2021-08-07 DIAGNOSIS — M5441 Lumbago with sciatica, right side: Secondary | ICD-10-CM

## 2021-08-07 MED ORDER — LIDOCAINE 5 % EX PTCH
1.0000 | MEDICATED_PATCH | CUTANEOUS | 0 refills | Status: DC
  Filled 2021-08-07 – 2022-02-11 (×2): qty 30, 30d supply, fill #0

## 2021-08-07 NOTE — Progress Notes (Signed)
? ?Office Visit Note ?  ?Patient: Claudia Wall           ?Date of Birth: 1975-05-05           ?MRN: [id number] ?Visit Date: 08/07/2021 ?             ?Requested by: Alen Bleacher, MD ?[address] ?[place],  [place] [address] ?PCP: Alen Bleacher, MD ? ? ?Assessment & Plan: ?Visit Diagnoses:  ?1. Chronic bilateral low back pain with bilateral sciatica   ?2. Pain in left hip   ?3. Pain in right hip   ? ? ?Plan: Impression is chronic low back pain with right lower extremity radiculopathy.  At this point, the patient's symptoms of been ongoing for well over a year.  She has not had relief from prescription medications or a guided home exercise program.  I would like to start her in formal physical therapy and go ahead and obtain an MRI to assess for structural abnormalities.  We have discussed not starting her on a steroid taper as she is currently dealing with hypertension.  Follow-up with [name] once the MRI has been completed.  Call with concerns or questions in the meantime. ? ?Follow-Up Instructions: Return for after MRI.  ? ?Orders:  ?Orders Placed This Encounter  ?Procedures  ? XR Lumbar Spine 2-3 Views  ? XR Pelvis 1-2 Views  ? ?Meds ordered this encounter  ?Medications  ? lidocaine (LIDODERM) 5 %  ?  Sig: Place 1 patch onto the skin daily. Remove & Discard patch within 12 hours or as directed by MD  ?  Dispense:  30 patch  ?  Refill:  0  ? ? ? ? Procedures: ?No procedures performed ? ? ?Clinical Data: ?No additional findings. ? ? ?Subjective: ?Chief Complaint  ?Patient presents with  ? Lower Back - Pain  ? ? ?HPI patient is a pleasant 46 year old female who comes in today with chronic low back pain for the past year and a half or so which is progressively worsened.  Pain she has is across the entire lower back and radiates to the right buttock and wraps around to the thigh.  Symptoms are worse with lumbar flexion, lumbar extension as well as with walking.  She has tried Aspercreme and ibuprofen with mild relief.   She has been working on a guided exercise program and stretching seems to make it worse.   ? ?Review of Systems as detailed in HPI.  All others reviewed and are negative. ? ? ?Objective: ?Vital Signs: There were no vitals taken for this visit. ? ?Physical Exam well-developed well-nourished female no acute distress.  Alert and oriented x3. ? ?Ortho Exam lumbar spine exam shows no spinous tenderness.  She does have moderate bilateral tenderness to the paraspinous musculature.  She has increased pain with lumbar flexion and extension.  Mildly positive straight leg raise.  No pain with logroll and FADIR.  She is neurovascular intact distally. ? ?Specialty Comments:  ?No specialty comments available. ? ?Imaging: ?No results found. ? ? ?PMFS History: ?Patient Active Problem List  ? Diagnosis Date Noted  ? OSA (obstructive sleep apnea) 05/07/2021  ? 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  ? Anxiety 10/20/2020  ? Cervical dysplasia 06/09/2019  ? Uterine polyp 04/20/2019  ? Chronic bilateral low back pain without sciatica 09/23/2018  ? Obesity, Class III, BMI 40-49.9 (morbid obesity) ([place]) 09/23/2018  ? Pelvic pain 07/18/2018  ? Bilateral carpal tunnel syndrome 02/09/2018  ? Acute lumbar myofascial strain 12/29/2017  ?  Chronic post-traumatic stress disorder (PTSD) 12/29/2017  ? Morbid obesity ([place]) 11/13/2017  ? Insomnia due to stress 11/09/2017  ? Chronic toe pain, left foot 11/09/2017  ? Other hyperlipidemia 05/13/2017  ? Vitamin D deficiency 05/13/2017  ? Prediabetes 05/13/2017  ? Enlarged thyroid 04/29/2017  ? PCOS (polycystic ovarian syndrome) 04/29/2017  ? Irregular periods/menstrual cycles 02/12/2017  ? Vaginal dryness 02/12/2017  ? Left carpal tunnel syndrome 07/08/2016  ? Carpal tunnel syndrome, right upper limb 07/07/2013  ? Palpitations 10/27/2011  ? Dyshidrotic eczema 11/06/2008  ? Tobacco use disorder 11/01/2008  ? BMI 45.0-49.9, adult ([place]) 02/03/2007  ? Primary hypertension 05/28/2006  ? GERD (gastroesophageal reflux  disease) 05/28/2006  ? ?Past Medical History:  ?Diagnosis Date  ? Alcohol abuse   ? Anxiety   ? Back pain   ? Breast abscess   ? Constipation   ? Depression   ? Gallbladder problem   ? GERD (gastroesophageal reflux disease)   ? HTN (hypertension)   ? Hypertension   ? Irregular periods/menstrual cycles   ? Joint pain   ? Lactose intolerance   ? Leg edema   ? Mastitis   ? Mastitis   ? h/o right  ? Obesity   ? PCOS (polycystic ovarian syndrome)   ? PTSD (post-traumatic stress disorder)   ? Swallowing difficulty   ? Tobacco abuse   ? Uterine fibroid   ?  ?Family History  ?Problem Relation Age of Onset  ? Cancer Mother   ? Hypertension Mother   ? Heart disease Mother   ? Depression Mother   ? Bipolar disorder Mother   ? Schizophrenia Mother   ? Alcoholism Mother   ? Drug abuse Mother   ? Obesity Mother   ? Hypertension Father   ? Diabetes Father   ? Heart attack Maternal Aunt   ? Heart failure Maternal Grandmother   ?  ?Past Surgical History:  ?Procedure Laterality Date  ? CHOLECYSTECTOMY  2002  ? INCISION AND DRAINAGE BREAST ABSCESS    ? IR RADIOLOGIST EVAL & MGMT  04/03/2021  ? IR RADIOLOGIST EVAL & MGMT  04/26/2021  ? ?Social History  ? ?Occupational History  ? Not on file  ?Tobacco Use  ? Smoking status: Former  ?  Packs/day: 0.25  ?  Years: 25.00  ?  Pack years: 6.25  ?  Types: Cigarettes  ? Smokeless tobacco: Never  ? Tobacco comments:  ?  "used to smoke 1/2 to 1ppd depending on stress"  ?Vaping Use  ? Vaping Use: Never used  ?Substance and Sexual Activity  ? Alcohol use: Yes  ?  Alcohol/week: 3.0 standard drinks  ?  Types: 3 Glasses of wine per week  ?  Comment: occassional  ? Drug use: No  ? Sexual activity: Yes  ?  Partners: Female  ?  Birth control/protection: Other-see comments, None  ?  Comment: for past 21 years  ? ? ? ? ? ? ?

## 2021-08-09 ENCOUNTER — Telehealth: Payer: Self-pay | Admitting: Pharmacist Clinician (PhC)/ Clinical Pharmacy Specialist

## 2021-08-09 ENCOUNTER — Other Ambulatory Visit: Payer: Self-pay | Admitting: Cardiovascular Disease

## 2021-08-09 NOTE — Telephone Encounter (Signed)
Left message to call back  

## 2021-08-09 NOTE — Telephone Encounter (Signed)
Patient calling the office for samples of medication: ? ? ?1.  What medication and dosage are you requesting samples for?    ? Semaglutide-Weight Management (WEGOVY) 1 MG/0.5ML SOAJ  ? ? ?2.  Are you currently out of this medication?  Yes ? ?She is requesting to leave vm if it is around 4 PM due to her being at a doctors appt ?  ?

## 2021-08-12 NOTE — Telephone Encounter (Signed)
Left message for patient of recommendations. She is to call if refill needed. ?

## 2021-08-12 NOTE — Telephone Encounter (Signed)
Manufacturer does not provide samples of Wegovy 1.'0mg'$  ?

## 2021-08-14 ENCOUNTER — Other Ambulatory Visit (HOSPITAL_COMMUNITY): Payer: Self-pay

## 2021-08-15 ENCOUNTER — Other Ambulatory Visit (HOSPITAL_COMMUNITY): Payer: Self-pay

## 2021-08-21 ENCOUNTER — Ambulatory Visit
Admission: RE | Admit: 2021-08-21 | Discharge: 2021-08-21 | Disposition: A | Discharge status: Home / Self Care | Payer: 59 | Source: Ambulatory Visit | Attending: Orthopaedic Surgery | Admitting: Orthopaedic Surgery

## 2021-08-21 ENCOUNTER — Other Ambulatory Visit (HOSPITAL_COMMUNITY): Payer: Self-pay

## 2021-08-21 DIAGNOSIS — G8929 Other chronic pain: Secondary | ICD-10-CM

## 2021-08-21 MED ORDER — HYDROCHLOROTHIAZIDE 25 MG PO TABS
25.0000 mg | ORAL_TABLET | Freq: Every day | ORAL | 3 refills | Status: DC
  Filled 2021-08-21: qty 90, 90d supply, fill #0

## 2021-08-21 MED ORDER — OLMESARTAN-AMLODIPINE-HCTZ 40-5-25 MG PO TABS
1.0000 | ORAL_TABLET | Freq: Every day | ORAL | 1 refills | Status: DC
  Filled 2021-08-21: qty 90, 90d supply, fill #0

## 2021-08-22 ENCOUNTER — Other Ambulatory Visit (HOSPITAL_COMMUNITY): Payer: Self-pay

## 2021-08-22 MED ORDER — TRAMADOL HCL 50 MG PO TABS
50.0000 mg | ORAL_TABLET | Freq: Three times a day (TID) | ORAL | 1 refills | Status: DC | PRN
  Filled 2021-08-22: qty 15, 5d supply, fill #0
  Filled 2021-10-09: qty 15, 5d supply, fill #1

## 2021-08-23 ENCOUNTER — Other Ambulatory Visit (HOSPITAL_COMMUNITY): Payer: Self-pay

## 2021-08-23 ENCOUNTER — Encounter: Payer: Self-pay | Admitting: Orthopaedic Surgery

## 2021-08-23 ENCOUNTER — Ambulatory Visit: Payer: 59 | Admitting: Orthopaedic Surgery

## 2021-08-23 DIAGNOSIS — G8929 Other chronic pain: Secondary | ICD-10-CM | POA: Diagnosis not present

## 2021-08-23 DIAGNOSIS — M545 Low back pain, unspecified: Secondary | ICD-10-CM

## 2021-08-23 MED ORDER — DICLOFENAC SODIUM 75 MG PO TBEC
75.0000 mg | DELAYED_RELEASE_TABLET | Freq: Two times a day (BID) | ORAL | 2 refills | Status: DC
  Filled 2021-08-23 – 2021-08-24 (×3): qty 30, 15d supply, fill #0

## 2021-08-23 NOTE — Progress Notes (Signed)
Office Visit Note   Patient: Claudia Wall           Date of Birth: 1975-09-07           MRN: [id number] Visit Date: 08/23/2021              Requested by: Alen Bleacher, MD [address] [place],  [place] [address] PCP: Alen Bleacher, MD   Assessment & Plan: Visit Diagnoses:  1. Chronic midline low back pain without sciatica     Plan: Claudia Wall returns today to discuss MRI report.  Denies any changes in symptoms.  Examination lumbar spine is nonfocal.  No motor or sensory deficits in the lower extremities.  The MRI of the lumbar spine essentially shows diffuse facet arthropathy without stenosis.  Recommend physical therapy and weight loss and I will send in prescription for diclofenac.  Follow-up as needed.  Follow-Up Instructions: No follow-ups on file.   Orders:  Orders Placed This Encounter  Procedures   Ambulatory referral to Physical Therapy   Meds ordered this encounter  Medications   diclofenac (VOLTAREN) 75 MG EC tablet    Sig: Take 1 tablet (75 mg total) by mouth 2 (two) times daily.    Dispense:  30 tablet    Refill:  2      Procedures: No procedures performed   Clinical Data: No additional findings.   Subjective: Chief Complaint  Patient presents with   Lower Back - Pain    HPI  Review of Systems   Objective: Vital Signs: There were no vitals taken for this visit.  Physical Exam  Ortho Exam  Specialty Comments:  No specialty comments available.  Imaging: No results found.   PMFS History: Patient Active Problem List   Diagnosis Date Noted   OSA (obstructive sleep apnea) 05/07/2021   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   Anxiety 10/20/2020   Cervical dysplasia 06/09/2019   Uterine polyp 04/20/2019   Chronic bilateral low back pain without sciatica 09/23/2018   Obesity, Class III, BMI 40-49.9 (morbid obesity) ([place]) 09/23/2018   Pelvic pain 07/18/2018   Bilateral carpal tunnel syndrome 02/09/2018   Acute lumbar myofascial strain  12/29/2017   Chronic post-traumatic stress disorder (PTSD) 12/29/2017   Morbid obesity ([place]) 11/13/2017   Insomnia due to stress 11/09/2017   Chronic toe pain, left foot 11/09/2017   Other hyperlipidemia 05/13/2017   Vitamin D deficiency 05/13/2017   Prediabetes 05/13/2017   Enlarged thyroid 04/29/2017   PCOS (polycystic ovarian syndrome) 04/29/2017   Irregular periods/menstrual cycles 02/12/2017   Vaginal dryness 02/12/2017   Left carpal tunnel syndrome 07/08/2016   Carpal tunnel syndrome, right upper limb 07/07/2013   Palpitations 10/27/2011   Dyshidrotic eczema 11/06/2008   Tobacco use disorder 11/01/2008   BMI 45.0-49.9, adult ([place]) 02/03/2007   Primary hypertension 05/28/2006   GERD (gastroesophageal reflux disease) 05/28/2006   Past Medical History:  Diagnosis Date   Alcohol abuse    Anxiety    Back pain    Breast abscess    Constipation    Depression    Gallbladder problem    GERD (gastroesophageal reflux disease)    HTN (hypertension)    Hypertension    Irregular periods/menstrual cycles    Joint pain    Lactose intolerance    Leg edema    Mastitis    Mastitis    h/o right   Obesity    PCOS (polycystic ovarian syndrome)    PTSD (post-traumatic stress disorder)    Swallowing  difficulty    Tobacco abuse    Uterine fibroid     Family History  Problem Relation Age of Onset   Cancer Mother    Hypertension Mother    Heart disease Mother    Depression Mother    Bipolar disorder Mother    Schizophrenia Mother    Alcoholism Mother    Drug abuse Mother    Obesity Mother    Hypertension Father    Diabetes Father    Heart attack Maternal Aunt    Heart failure Maternal Grandmother     Past Surgical History:  Procedure Laterality Date   CHOLECYSTECTOMY  2002   INCISION AND DRAINAGE BREAST ABSCESS     IR RADIOLOGIST EVAL & MGMT  04/03/2021   IR RADIOLOGIST EVAL & MGMT  04/26/2021   Social History   Occupational History   Not on file  Tobacco Use    Smoking status: Former    Packs/day: 0.25    Years: 25.00    Pack years: 6.25    Types: Cigarettes   Smokeless tobacco: Never   Tobacco comments:    "used to smoke 1/2 to 1ppd depending on stress"  Vaping Use   Vaping Use: Never used  Substance and Sexual Activity   Alcohol use: Yes    Alcohol/week: 3.0 standard drinks    Types: 3 Glasses of wine per week    Comment: occassional   Drug use: No   Sexual activity: Yes    Partners: Female    Birth control/protection: Other-see comments, None    Comment: for past 21 years

## 2021-08-24 ENCOUNTER — Other Ambulatory Visit (HOSPITAL_COMMUNITY): Payer: Self-pay

## 2021-08-27 ENCOUNTER — Ambulatory Visit: Payer: 59 | Admitting: Orthopaedic Surgery

## 2021-08-27 ENCOUNTER — Other Ambulatory Visit (HOSPITAL_COMMUNITY): Payer: Self-pay

## 2021-08-28 ENCOUNTER — Other Ambulatory Visit (HOSPITAL_COMMUNITY): Payer: Self-pay

## 2021-08-28 ENCOUNTER — Telehealth: Payer: Self-pay | Admitting: Cardiovascular Disease

## 2021-08-28 NOTE — Telephone Encounter (Signed)
*  STAT* If patient is at the pharmacy, call can be transferred to refill team.   1. Which medications need to be refilled? (please list name of each medication and dose if known) Semaglutide-Weight Management ([place]) 1 MG/0.5ML SOAJ  2. Which pharmacy/location (including street and city if local pharmacy) is medication to be sent to? [facility]  3. Do they need a 30 day or 90 day supply? 30  Pt needs new prescription sent to pharmacy due to increase in MG.

## 2021-08-30 ENCOUNTER — Other Ambulatory Visit (HOSPITAL_COMMUNITY): Payer: Self-pay

## 2021-08-30 ENCOUNTER — Telehealth: Payer: Self-pay | Admitting: Pharmacist

## 2021-08-30 MED ORDER — WEGOVY 1.7 MG/0.75ML ~~LOC~~ SOAJ
1.7000 mg | SUBCUTANEOUS | 0 refills | Status: DC
  Filled 2021-08-30 – 2022-01-18 (×4): qty 3, 28d supply, fill #0

## 2021-08-30 MED ORDER — WEGOVY 2.4 MG/0.75ML ~~LOC~~ SOAJ
2.4000 mg | SUBCUTANEOUS | 0 refills | Status: DC
  Filled 2021-08-30: qty 3, fill #0

## 2021-08-30 MED ORDER — WEGOVY 1 MG/0.5ML ~~LOC~~ SOAJ
1.0000 mg | SUBCUTANEOUS | 0 refills | Status: DC
  Filled 2021-08-30: qty 2, 28d supply, fill #0

## 2021-08-30 NOTE — Telephone Encounter (Signed)
Patient called to ask about her [facility]. Finshed the 0.'25mg'$  doses and started the 0.'5mg'$  but had stomach upset and then left the Rx in her car so she discontinued. Wanted to know if she should stay on 0.'5mg'$  or increase to 1.'0mg'$ . Advised she should complete the 0.'5mg'$  dossages before increasing.  Now works for [employer] and requests the 1.'0mg'$  and above be sent to [facility].

## 2021-09-02 ENCOUNTER — Encounter: Payer: Self-pay | Admitting: Pharmacist Clinician (PhC)/ Clinical Pharmacy Specialist

## 2021-09-02 MED ORDER — WEGOVY 0.5 MG/0.5ML ~~LOC~~ SOAJ: 0.5000 mg | SUBCUTANEOUS | 0 refills | Status: DC

## 2021-09-02 NOTE — Telephone Encounter (Signed)
Rx sent to [facility]

## 2021-09-02 NOTE — Addendum Note (Signed)
Addended by: Rollen Sox on: 09/02/2021 09:44 AM   Modules accepted: Orders

## 2021-09-02 NOTE — Telephone Encounter (Signed)
Pt requesting to stay on 0.'5mg'$  wegovy an additional month and would like rx refilled to [facility] [address]. I will route to dr. Evelene Croon as he has been working with this patient.

## 2021-09-03 ENCOUNTER — Encounter: Payer: Self-pay | Admitting: *Deleted

## 2021-09-05 NOTE — Telephone Encounter (Signed)
September 04, 2021 Skeet Latch, MD to [facility]     09/04/21 11:54 AM Sound like it is resolved.  She has been in contact with PharmD and refilling Weygovy.  If she is still having palpitations we can prescribe metoprolol succinate '25mg'$  daily.   TCR   Left message to call back

## 2021-09-10 ENCOUNTER — Other Ambulatory Visit: Payer: Self-pay | Admitting: Obstetrics & Gynecology

## 2021-09-12 ENCOUNTER — Other Ambulatory Visit (HOSPITAL_COMMUNITY): Payer: Self-pay

## 2021-09-18 ENCOUNTER — Other Ambulatory Visit: Payer: Self-pay

## 2021-09-18 ENCOUNTER — Encounter (HOSPITAL_BASED_OUTPATIENT_CLINIC_OR_DEPARTMENT_OTHER): Payer: Self-pay | Admitting: Obstetrics & Gynecology

## 2021-09-18 NOTE — Progress Notes (Addendum)
Your procedure is scheduled on 09-27-2021  Report to [facility].   Call this number if you have problems the morning of surgery  :[phone].   OUR ADDRESS IS [place].  WE ARE LOCATED IN [facility].  PLEASE BRING YOUR INSURANCE CARD AND PHOTO ID DAY OF SURGERY.  ONLY 2 PEOPLE ARE ALLOWED IN  WAITING  ROOM.                                      REMEMBER:  DO NOT EAT FOOD, CANDY GUM OR MINTS  AFTER MIDNIGHT THE NIGHT BEFORE YOUR SURGERY . YOU MAY HAVE CLEAR LIQUIDS FROM MIDNIGHT THE NIGHT BEFORE YOUR SURGERY UNTIL  1015 AM. NO CLEAR LIQUIDS AFTER 1015 AM DAY OF SURGERY.  YOU MAY  BRUSH YOUR TEETH MORNING OF SURGERY AND RINSE YOUR MOUTH OUT, NO CHEWING GUM CANDY OR MINTS.     CLEAR LIQUID DIET   Foods Allowed                                                                     Foods Excluded  Coffee and tea, regular and decaf                             liquids that you cannot  Plain Jell-O                                                                   see through such as: Fruit ices (not with fruit pulp)                                     milk, soups, orange juice  Plain  Popsicles                                    All solid food Carbonated beverages, regular and diet                                    Cranberry, grape and apple juices Sports drinks like Gatorade _____________________________________________________________________     TAKE THESE MEDICATIONS MORNING OF SURGERY:NONE    UP TO 4 VISITORS  MAY VISIT IN THE EXTENDED RECOVERY ROOM UNTIL 800 PM ONLY.  ONE  VISITOR AGE 24 AND OVER MAY SPEND THE NIGHT AND MUST BE IN EXTENDED RECOVERY ROOM NO LATER THAN 800 PM . YOUR DISCHARGE TIME AFTER YOU SPEND THE NIGHT IS 900 AM THE MORNING AFTER YOUR SURGERY.  YOU MAY PACK A SMALL OVERNIGHT BAG WITH TOILETRIES FOR YOUR OVERNIGHT STAY IF YOU WISH.  YOUR PRESCRIPTION MEDICATIONS WILL BE PROVIDED DURING [place].                                      DO NOT WEAR JEWERLY, MAKE UP. DO NOT WEAR LOTIONS, POWDERS, PERFUMES OR NAIL POLISH ON YOUR FINGERNAILS. TOENAIL POLISH IS OK TO WEAR. DO NOT SHAVE FOR 48 HOURS PRIOR TO DAY OF SURGERY. MEN MAY SHAVE FACE AND NECK. CONTACTS, GLASSES, OR DENTURES MAY NOT BE WORN TO SURGERY.  REMEMBER: NO SMOKING, DRUGS OR ALCOHOL FOR 24 HOURS BEFORE YOUR SURGERY.                                    [facility] IS NOT RESPONSIBLE  FOR ANY BELONGINGS.                                                                    Marland Kitchen           [facility] - Preparing for Surgery Before surgery, you can play an important role.  Because skin is not sterile, your skin needs to be as free of germs as possible.  You can reduce the number of germs on your skin by washing with CHG (chlorahexidine gluconate) soap before surgery.  CHG is an antiseptic cleaner which kills germs and bonds with the skin to continue killing germs even after washing. Please DO NOT use if you have an allergy to CHG or antibacterial soaps.  If your skin becomes reddened/irritated stop using the CHG and inform your nurse when you arrive at Short Stay. Do not shave (including legs and underarms) for at least 48 hours prior to the first CHG shower.  You may shave your face/neck. Please follow these instructions carefully:  1.  Shower with CHG Soap the night before surgery and the  morning of Surgery.  2.  If you choose to wash your hair, wash your hair first as usual with your  normal  shampoo.  3.  After you shampoo, rinse your hair and body thoroughly to remove the  shampoo.                            4.  Use CHG as you would any other liquid soap.  You can apply chg directly  to the skin and wash , please wash your belly button thoroughly with chg soap provided night before and morning of your surgery.                     Gently with a scrungie or clean washcloth.  5.  Apply the CHG Soap to your body ONLY  FROM THE NECK DOWN.   Do not use on face/ open                           Wound or open sores. Avoid contact with eyes, ears mouth and genitals (private parts).  Wash face,  Genitals (private parts) with your normal soap.             6.  Wash thoroughly, paying special attention to the area where your surgery  will be performed.  7.  Thoroughly rinse your body with warm water from the neck down.  8.  DO NOT shower/wash with your normal soap after using and rinsing off  the CHG Soap.                9.  Pat yourself dry with a clean towel.            10.  Wear clean pajamas.            11.  Place clean sheets on your bed the night of your first shower and do not  sleep with pets. Day of Surgery : Do not apply any lotions/deodorants the morning of surgery.  Please wear clean clothes to the hospital/surgery center.  IF YOU HAVE ANY SKIN IRRITATION OR PROBLEMS WITH THE SURGICAL SOAP, PLEASE GET A BAR OF GOLD DIAL SOAP AND SHOWER THE NIGHT BEFORE YOUR SURGERY AND THE MORNING OF YOUR SURGERY. PLEASE LET THE NURSE KNOW MORNING OF YOUR SURGERY IF YOU HAD ANY PROBLEMS WITH THE SURGICAL SOAP.   ________________________________________________________________________                                                        QUESTIONS Claudia Wall PRE OP NURSE PHONE [phone].

## 2021-09-18 NOTE — Progress Notes (Addendum)
Spoke w/ via phone for pre-op interview---pt Lab needs dos----   urine poct  per anesthesia          Lab results----lab appt 09-25-2021 for  cbc bmp t & s COVID test -----patient states asymptomatic no test needed Arrive at -------1115 am [phone] NPO after MN NO Solid Food.  Clear liquids from MN until---1015 am  Med rec completed Medications to take morning of surgery -----none Diabetic medication -----n/a Patient instructed no nail polish to be worn day of surgery Patient instructed to bring photo id and insurance card day of surgery Patient aware to have Driver (ride ) / caregiver girlfriend crista moore    for 24 hours after surgery  Patient Special Instructions -----pt given overnight stay instructions Pre-Op special Istructions -----none Patient verbalized understanding of instructions that were given at this phone interview. Patient denies shortness of breath, chest pain, fever, cough at this phone interview.   [facility] cardiology dr t Oval Linsey 07-03-2021 epic Ekg 01-11-2021 chart/epic Echo 02-06-2021 epic LVEF 60 to 65 %  Zio monitor report 01-30-2021

## 2021-09-23 ENCOUNTER — Ambulatory Visit: Payer: 59

## 2021-09-25 ENCOUNTER — Telehealth: Payer: Self-pay | Admitting: Cardiovascular Disease

## 2021-09-25 ENCOUNTER — Encounter (HOSPITAL_COMMUNITY)
Admission: RE | Admit: 2021-09-25 | Discharge: 2021-09-25 | Disposition: A | Discharge status: Home / Self Care | Payer: 59 | Source: Ambulatory Visit | Attending: Obstetrics & Gynecology | Admitting: Obstetrics & Gynecology

## 2021-09-25 DIAGNOSIS — Z01812 Encounter for preprocedural laboratory examination: Secondary | ICD-10-CM | POA: Insufficient documentation

## 2021-09-25 LAB — BASIC METABOLIC PANEL
Anion gap: 7 (ref 5–15)
BUN: 12 mg/dL (ref 6–20)
CO2: 27 mmol/L (ref 22–32)
Calcium: 9.7 mg/dL (ref 8.9–10.3)
Chloride: 104 mmol/L (ref 98–111)
Creatinine, Ser: 0.97 mg/dL (ref 0.44–1.00)
GFR, Estimated: >60 mL/min (ref >60)
Glucose, Bld: 151 mg/dL — ABNORMAL HIGH (ref 70–99)
Potassium: 3.9 mmol/L (ref 3.5–5.1)
Sodium: 138 mmol/L (ref 135–145)

## 2021-09-25 LAB — CBC
HCT: 39.9 % (ref 36.0–46.0)
Hemoglobin: 13.3 g/dL (ref 12.0–15.0)
MCH: 30.2 pg (ref 26.0–34.0)
MCHC: 33.3 g/dL (ref 30.0–36.0)
MCV: 90.5 fL (ref 80.0–100.0)
Platelets: 374 10*3/uL (ref 150–400)
RBC: 4.41 MIL/uL (ref 3.87–5.11)
RDW: 12.9 % (ref 11.5–15.5)
WBC: 7.3 10*3/uL (ref 4.0–10.5)
nRBC: 0 % (ref 0.0–0.2)

## 2021-09-25 MED ORDER — AMLODIPINE BESYLATE 10 MG PO TABS
10.0000 mg | ORAL_TABLET | Freq: Every day | ORAL | 3 refills | Status: DC
  Filled 2021-11-20: qty 90, 90d supply, fill #0
  Filled 2022-02-11: qty 90, 90d supply, fill #1

## 2021-09-25 NOTE — Telephone Encounter (Signed)
Ensure checking BP 2 hours after medications and 5 to 10-minute sitting and resting.  Continue spironolactone, olmesartan current doses.  Increase amlodipine to 10 mg daily.  Please have her send MyChart message in 1 week to report blood pressure.  Follow-up as scheduled with Dr. Oval Linsey in August.  Loel Dubonnet, NP

## 2021-09-25 NOTE — Telephone Encounter (Signed)
RN returned call to patient and provided the following recommendations, RX sent to pharmacy and informed patient that she can take two of her '5mg'$  tablets to use up the supply and there will be an updated prescription at the pharmacy when she needs it.      "Ensure checking BP 2 hours after medications and 5 to 10-minute sitting and resting.  Continue spironolactone, olmesartan current doses.  Increase amlodipine to 10 mg daily.  Please have her send MyChart message in 1 week to report blood pressure.  Follow-up as scheduled with Dr. Oval Linsey in August.   Loel Dubonnet, NP "

## 2021-09-25 NOTE — Telephone Encounter (Signed)
Please advise 

## 2021-09-25 NOTE — Telephone Encounter (Signed)
Pt c/o BP issue: STAT if pt c/o blurred vision, one-sided weakness or slurred speech  1. What are your last 5 BP readings?  189/109 165/109 160/100  2. Are you having any other symptoms (ex. Dizziness, headache, blurred vision, passed out)? no  3. What is your BP issue? Pt stated that she had a preop visit at [facility] and her BP was high. She states that this may affect her getting her surgery done. She states she is not sure if she is just nervous about her upcoming surgery or not. She states that she does take the medications below for her BP and they don't seem to be working. She denied having any other symptoms. Please advise.   spironolactone (ALDACTONE) 25 MG tablet  amLODipine (NORVASC) 5 MG tablet    olmesartan (BENICAR) 40 MG tablet

## 2021-09-27 ENCOUNTER — Encounter (HOSPITAL_BASED_OUTPATIENT_CLINIC_OR_DEPARTMENT_OTHER): Payer: Self-pay | Admitting: Obstetrics & Gynecology

## 2021-09-27 ENCOUNTER — Ambulatory Visit (HOSPITAL_BASED_OUTPATIENT_CLINIC_OR_DEPARTMENT_OTHER): Payer: 59 | Admitting: Anesthesiology

## 2021-09-27 ENCOUNTER — Ambulatory Visit (HOSPITAL_BASED_OUTPATIENT_CLINIC_OR_DEPARTMENT_OTHER)
Admission: RE | Admit: 2021-09-27 | Discharge: 2021-09-28 | Disposition: A | Discharge status: Home / Self Care | Payer: 59 | Attending: Obstetrics & Gynecology | Admitting: Obstetrics & Gynecology

## 2021-09-27 ENCOUNTER — Other Ambulatory Visit: Payer: Self-pay

## 2021-09-27 ENCOUNTER — Encounter (HOSPITAL_BASED_OUTPATIENT_CLINIC_OR_DEPARTMENT_OTHER)
Admission: RE | Disposition: A | Discharge status: Home / Self Care | Payer: Self-pay | Source: Home / Self Care | Attending: Obstetrics & Gynecology

## 2021-09-27 DIAGNOSIS — F418 Other specified anxiety disorders: Secondary | ICD-10-CM

## 2021-09-27 DIAGNOSIS — G4730 Sleep apnea, unspecified: Secondary | ICD-10-CM | POA: Diagnosis not present

## 2021-09-27 DIAGNOSIS — Z6841 Body Mass Index (BMI) 40.0 and over, adult: Secondary | ICD-10-CM | POA: Diagnosis not present

## 2021-09-27 DIAGNOSIS — N888 Other specified noninflammatory disorders of cervix uteri: Secondary | ICD-10-CM | POA: Insufficient documentation

## 2021-09-27 DIAGNOSIS — D259 Leiomyoma of uterus, unspecified: Secondary | ICD-10-CM | POA: Insufficient documentation

## 2021-09-27 DIAGNOSIS — I10 Essential (primary) hypertension: Secondary | ICD-10-CM

## 2021-09-27 DIAGNOSIS — K219 Gastro-esophageal reflux disease without esophagitis: Secondary | ICD-10-CM | POA: Insufficient documentation

## 2021-09-27 DIAGNOSIS — Z87891 Personal history of nicotine dependence: Secondary | ICD-10-CM

## 2021-09-27 DIAGNOSIS — N920 Excessive and frequent menstruation with regular cycle: Secondary | ICD-10-CM | POA: Insufficient documentation

## 2021-09-27 DIAGNOSIS — Z90710 Acquired absence of both cervix and uterus: Secondary | ICD-10-CM | POA: Diagnosis present

## 2021-09-27 DIAGNOSIS — Z01818 Encounter for other preprocedural examination: Secondary | ICD-10-CM

## 2021-09-27 HISTORY — DX: Sleep apnea, unspecified: G47.30

## 2021-09-27 HISTORY — PX: ROBOTIC ASSISTED LAPAROSCOPIC HYSTERECTOMY AND SALPINGECTOMY: SHX6379

## 2021-09-27 HISTORY — DX: Prediabetes: R73.03

## 2021-09-27 LAB — TYPE AND SCREEN
ABO/RH(D): A POS
Antibody Screen: NEGATIVE

## 2021-09-27 LAB — POCT PREGNANCY, URINE: Preg Test, Ur: NEGATIVE

## 2021-09-27 LAB — ABO/RH: ABO/RH(D): A POS

## 2021-09-27 SURGERY — XI ROBOTIC ASSISTED LAPAROSCOPIC HYSTERECTOMY AND SALPINGECTOMY
Anesthesia: General | Laterality: Bilateral

## 2021-09-27 MED ORDER — SODIUM CHLORIDE (PF) 0.9 % IJ SOLN
INTRAMUSCULAR | Status: DC | PRN
  Administered 2021-09-27: 30 mL

## 2021-09-27 MED ORDER — FENTANYL CITRATE (PF) 100 MCG/2ML IJ SOLN
25.0000 ug | INTRAMUSCULAR | Status: DC | PRN
  Administered 2021-09-27: 25 ug via INTRAVENOUS

## 2021-09-27 MED ORDER — OXYCODONE HCL 5 MG PO TABS
5.0000 mg | ORAL_TABLET | Freq: Once | ORAL | Status: AC | PRN
  Administered 2021-09-27: 5 mg via ORAL

## 2021-09-27 MED ORDER — MIDAZOLAM HCL 2 MG/2ML IJ SOLN
INTRAMUSCULAR | Status: DC | PRN
  Administered 2021-09-27: 2 mg via INTRAVENOUS

## 2021-09-27 MED ORDER — DEXAMETHASONE SODIUM PHOSPHATE 10 MG/ML IJ SOLN
INTRAMUSCULAR | Status: DC | PRN
  Administered 2021-09-27: 10 mg via INTRAVENOUS

## 2021-09-27 MED ORDER — FENTANYL CITRATE (PF) 100 MCG/2ML IJ SOLN
INTRAMUSCULAR | Status: AC
  Filled 2021-09-27: qty 2

## 2021-09-27 MED ORDER — KETOROLAC TROMETHAMINE 30 MG/ML IJ SOLN
INTRAMUSCULAR | Status: DC | PRN
  Administered 2021-09-27: 30 mg via INTRAVENOUS

## 2021-09-27 MED ORDER — PROPOFOL 10 MG/ML IV BOLUS
INTRAVENOUS | Status: DC | PRN
  Administered 2021-09-27: 200 mg via INTRAVENOUS

## 2021-09-27 MED ORDER — ONDANSETRON HCL 4 MG PO TABS: 4.0000 mg | ORAL_TABLET | Freq: Four times a day (QID) | ORAL | Status: DC | PRN

## 2021-09-27 MED ORDER — SODIUM CHLORIDE 0.9 % IV SOLN
INTRAVENOUS | Status: DC | PRN
  Administered 2021-09-27: 13 mL
  Administered 2021-09-27: 47 mL

## 2021-09-27 MED ORDER — IRBESARTAN 300 MG PO TABS
300.0000 mg | ORAL_TABLET | Freq: Every day | ORAL | Status: DC
  Administered 2021-09-27: 300 mg via ORAL
  Filled 2021-09-27: qty 1

## 2021-09-27 MED ORDER — CEFAZOLIN SODIUM-DEXTROSE 2-4 GM/100ML-% IV SOLN
INTRAVENOUS | Status: AC
  Filled 2021-09-27: qty 100

## 2021-09-27 MED ORDER — HYDROMORPHONE HCL 1 MG/ML IJ SOLN
INTRAMUSCULAR | Status: DC | PRN
  Administered 2021-09-27 (×2): .5 mg via INTRAVENOUS

## 2021-09-27 MED ORDER — OXYCODONE HCL 5 MG PO TABS: 5.0000 mg | ORAL_TABLET | ORAL | Status: DC | PRN

## 2021-09-27 MED ORDER — AMLODIPINE BESYLATE 10 MG PO TABS
10.0000 mg | ORAL_TABLET | Freq: Every day | ORAL | Status: DC
  Administered 2021-09-27: 10 mg via ORAL
  Filled 2021-09-27: qty 1

## 2021-09-27 MED ORDER — SUGAMMADEX SODIUM 500 MG/5ML IV SOLN
INTRAVENOUS | Status: AC
  Filled 2021-09-27: qty 5

## 2021-09-27 MED ORDER — OXYCODONE HCL 5 MG PO TABS
ORAL_TABLET | ORAL | Status: AC
  Filled 2021-09-27: qty 1

## 2021-09-27 MED ORDER — MENTHOL 3 MG MT LOZG: 1.0000 | LOZENGE | OROMUCOSAL | Status: DC | PRN

## 2021-09-27 MED ORDER — POVIDONE-IODINE 10 % EX SWAB: 2.0000 | Freq: Once | CUTANEOUS | Status: DC

## 2021-09-27 MED ORDER — LACTATED RINGERS IV SOLN: INTRAVENOUS | Status: DC | PRN

## 2021-09-27 MED ORDER — MIDAZOLAM HCL 2 MG/2ML IJ SOLN
INTRAMUSCULAR | Status: AC
  Filled 2021-09-27: qty 2

## 2021-09-27 MED ORDER — SUGAMMADEX SODIUM 200 MG/2ML IV SOLN
INTRAVENOUS | Status: DC | PRN
  Administered 2021-09-27: 300 mg via INTRAVENOUS

## 2021-09-27 MED ORDER — FENTANYL CITRATE (PF) 250 MCG/5ML IJ SOLN
INTRAMUSCULAR | Status: DC | PRN
  Administered 2021-09-27: 100 ug via INTRAVENOUS
  Administered 2021-09-27: 50 ug via INTRAVENOUS

## 2021-09-27 MED ORDER — CEFAZOLIN SODIUM-DEXTROSE 2-4 GM/100ML-% IV SOLN
2.0000 g | INTRAVENOUS | Status: AC
  Administered 2021-09-27: 2 g via INTRAVENOUS

## 2021-09-27 MED ORDER — HYDROMORPHONE HCL 2 MG/ML IJ SOLN
INTRAMUSCULAR | Status: AC
  Filled 2021-09-27: qty 1

## 2021-09-27 MED ORDER — ROPIVACAINE HCL 5 MG/ML IJ SOLN
INTRAMUSCULAR | Status: AC
  Filled 2021-09-27: qty 30

## 2021-09-27 MED ORDER — KETOROLAC TROMETHAMINE 30 MG/ML IJ SOLN
30.0000 mg | Freq: Four times a day (QID) | INTRAMUSCULAR | Status: DC
  Administered 2021-09-27 – 2021-09-28 (×2): 30 mg via INTRAVENOUS

## 2021-09-27 MED ORDER — PHENYLEPHRINE HCL (PRESSORS) 10 MG/ML IV SOLN
INTRAVENOUS | Status: AC
  Filled 2021-09-27: qty 1

## 2021-09-27 MED ORDER — ROCURONIUM BROMIDE 10 MG/ML (PF) SYRINGE
PREFILLED_SYRINGE | INTRAVENOUS | Status: DC | PRN
  Administered 2021-09-27: 75 mg via INTRAVENOUS
  Administered 2021-09-27: 25 mg via INTRAVENOUS

## 2021-09-27 MED ORDER — LIDOCAINE 2% (20 MG/ML) 5 ML SYRINGE
INTRAMUSCULAR | Status: DC | PRN
  Administered 2021-09-27: 60 mg via INTRAVENOUS

## 2021-09-27 MED ORDER — KETOROLAC TROMETHAMINE 30 MG/ML IJ SOLN
INTRAMUSCULAR | Status: AC
  Filled 2021-09-27: qty 1

## 2021-09-27 MED ORDER — PROPOFOL 10 MG/ML IV BOLUS
INTRAVENOUS | Status: AC
  Filled 2021-09-27: qty 20

## 2021-09-27 MED ORDER — PHENYLEPHRINE 80 MCG/ML (10ML) SYRINGE FOR IV PUSH (FOR BLOOD PRESSURE SUPPORT)
PREFILLED_SYRINGE | INTRAVENOUS | Status: DC | PRN
  Administered 2021-09-27 (×3): 80 ug via INTRAVENOUS

## 2021-09-27 MED ORDER — OXYCODONE HCL 5 MG/5ML PO SOLN: 5.0000 mg | Freq: Once | ORAL | Status: AC | PRN

## 2021-09-27 MED ORDER — TRAMADOL HCL 50 MG PO TABS
ORAL_TABLET | ORAL | Status: AC
  Filled 2021-09-27: qty 1

## 2021-09-27 MED ORDER — ONDANSETRON HCL 4 MG/2ML IJ SOLN
4.0000 mg | Freq: Once | INTRAMUSCULAR | Status: DC | PRN
Start: 2021-09-27 — End: 2021-09-28

## 2021-09-27 MED ORDER — DIPHENHYDRAMINE HCL 50 MG/ML IJ SOLN
INTRAMUSCULAR | Status: DC | PRN
  Administered 2021-09-27: 12.5 mg via INTRAVENOUS

## 2021-09-27 MED ORDER — ONDANSETRON HCL 4 MG/2ML IJ SOLN: 4.0000 mg | Freq: Four times a day (QID) | INTRAMUSCULAR | Status: DC | PRN

## 2021-09-27 MED ORDER — ONDANSETRON HCL 4 MG/2ML IJ SOLN
INTRAMUSCULAR | Status: DC | PRN
  Administered 2021-09-27: 4 mg via INTRAVENOUS

## 2021-09-27 MED ORDER — SODIUM CHLORIDE 0.9 % IR SOLN
Status: DC | PRN
  Administered 2021-09-27: 3000 mL

## 2021-09-27 MED ORDER — FENTANYL CITRATE (PF) 250 MCG/5ML IJ SOLN
INTRAMUSCULAR | Status: AC
  Filled 2021-09-27: qty 5

## 2021-09-27 MED ORDER — PHENYLEPHRINE HCL-NACL 20-0.9 MG/250ML-% IV SOLN
INTRAVENOUS | Status: DC | PRN
  Administered 2021-09-27: 25 ug/min via INTRAVENOUS

## 2021-09-27 MED ORDER — IBUPROFEN 200 MG PO TABS: 600.0000 mg | ORAL_TABLET | Freq: Four times a day (QID) | ORAL | Status: DC

## 2021-09-27 MED ORDER — TRAMADOL HCL 50 MG PO TABS
50.0000 mg | ORAL_TABLET | Freq: Four times a day (QID) | ORAL | Status: DC | PRN
  Administered 2021-09-27 – 2021-09-28 (×2): 50 mg via ORAL

## 2021-09-27 MED ORDER — LACTATED RINGERS IV SOLN: INTRAVENOUS | Status: DC

## 2021-09-27 MED ORDER — PROPOFOL 500 MG/50ML IV EMUL
INTRAVENOUS | Status: DC | PRN
  Administered 2021-09-27: 25 ug/kg/min via INTRAVENOUS

## 2021-09-27 SURGICAL SUPPLY — 65 items
ADH SKN CLS APL DERMABOND .7 (GAUZE/BANDAGES/DRESSINGS) ×1
APL SRG 38 LTWT LNG FL B (MISCELLANEOUS) ×1
APPLICATOR ARISTA FLEXITIP XL (MISCELLANEOUS) ×1 IMPLANT
BARRIER ADHS 3X4 INTERCEED (GAUZE/BANDAGES/DRESSINGS) IMPLANT
BRR ADH 4X3 ABS CNTRL BYND (GAUZE/BANDAGES/DRESSINGS)
CATH FOLEY 3WAY  5CC 16FR (CATHETERS) ×2
CATH FOLEY 3WAY 5CC 16FR (CATHETERS) IMPLANT
COVER BACK TABLE 60X90IN (DRAPES) ×2 IMPLANT
COVER TIP SHEARS 8 DVNC (MISCELLANEOUS) ×1 IMPLANT
COVER TIP SHEARS 8MM DA VINCI (MISCELLANEOUS) ×2
DECANTER SPIKE VIAL GLASS SM (MISCELLANEOUS) ×5 IMPLANT
DEFOGGER SCOPE WARMER CLEARIFY (MISCELLANEOUS) ×2 IMPLANT
DERMABOND ADVANCED (GAUZE/BANDAGES/DRESSINGS) ×1
DERMABOND ADVANCED .7 DNX12 (GAUZE/BANDAGES/DRESSINGS) ×1 IMPLANT
DRAPE ARM DVNC X/XI (DISPOSABLE) ×4 IMPLANT
DRAPE COLUMN DVNC XI (DISPOSABLE) ×1 IMPLANT
DRAPE DA VINCI XI ARM (DISPOSABLE) ×8
DRAPE DA VINCI XI COLUMN (DISPOSABLE) ×2
DRAPE UTILITY XL STRL (DRAPES) ×2 IMPLANT
DURAPREP 26ML APPLICATOR (WOUND CARE) ×2 IMPLANT
ELECT REM PT RETURN 9FT ADLT (ELECTROSURGICAL) ×2
ELECTRODE REM PT RTRN 9FT ADLT (ELECTROSURGICAL) ×1 IMPLANT
GAUZE 4X4 16PLY ~~LOC~~+RFID DBL (SPONGE) ×6 IMPLANT
GAUZE PETROLATUM 1 X8 (GAUZE/BANDAGES/DRESSINGS) IMPLANT
GLOVE BIO SURGEON STRL SZ7 (GLOVE) ×6 IMPLANT
GLOVE BIOGEL PI IND STRL 7.0 (GLOVE) ×5 IMPLANT
GLOVE BIOGEL PI INDICATOR 7.0 (GLOVE) ×5
HEMOSTAT ARISTA ABSORB 3G PWDR (HEMOSTASIS) ×1 IMPLANT
HOLDER FOLEY CATH W/STRAP (MISCELLANEOUS) IMPLANT
IRRIG SUCT STRYKERFLOW 2 WTIP (MISCELLANEOUS) ×2
IRRIGATION SUCT STRKRFLW 2 WTP (MISCELLANEOUS) ×1 IMPLANT
KIT TURNOVER CYSTO (KITS) ×2 IMPLANT
LEGGING LITHOTOMY PAIR STRL (DRAPES) ×2 IMPLANT
OBTURATOR OPTICAL STANDARD 8MM (TROCAR) ×2
OBTURATOR OPTICAL STND 8 DVNC (TROCAR) ×1
OBTURATOR OPTICALSTD 8 DVNC (TROCAR) ×1 IMPLANT
OCCLUDER COLPOPNEUMO (BALLOONS) ×2 IMPLANT
PACK ROBOT WH (CUSTOM PROCEDURE TRAY) ×2 IMPLANT
PACK ROBOTIC GOWN (GOWN DISPOSABLE) ×2 IMPLANT
PACK TRENDGUARD 450 HYBRID PRO (MISCELLANEOUS) ×1 IMPLANT
PAD OB MATERNITY 4.3X12.25 (PERSONAL CARE ITEMS) ×2 IMPLANT
PAD PREP 24X48 CUFFED NSTRL (MISCELLANEOUS) ×2 IMPLANT
PROTECTOR NERVE ULNAR (MISCELLANEOUS) ×2 IMPLANT
SEAL CANN UNIV 5-8 DVNC XI (MISCELLANEOUS) ×3 IMPLANT
SEAL XI 5MM-8MM UNIVERSAL (MISCELLANEOUS) ×8
SEALER VESSEL DA VINCI XI (MISCELLANEOUS) ×4
SEALER VESSEL EXT DVNC XI (MISCELLANEOUS) ×1 IMPLANT
SET IRRIG Y TYPE TUR BLADDER L (SET/KITS/TRAYS/PACK) IMPLANT
SET TRI-LUMEN FLTR TB AIRSEAL (TUBING) ×2 IMPLANT
SPONGE T-LAP 4X18 ~~LOC~~+RFID (SPONGE) ×2 IMPLANT
SUT VIC AB 4-0 PS2 18 (SUTURE) ×4 IMPLANT
SUT VICRYL 0 UR6 27IN ABS (SUTURE) ×2 IMPLANT
SUT VLOC 180 0 9IN  GS21 (SUTURE) ×2
SUT VLOC 180 0 9IN GS21 (SUTURE) ×1 IMPLANT
TIP RUMI ORANGE 6.7MMX12CM (TIP) IMPLANT
TIP UTERINE 5.1X6CM LAV DISP (MISCELLANEOUS) IMPLANT
TIP UTERINE 6.7X10CM GRN DISP (MISCELLANEOUS) ×1 IMPLANT
TIP UTERINE 6.7X6CM WHT DISP (MISCELLANEOUS) IMPLANT
TIP UTERINE 6.7X8CM BLUE DISP (MISCELLANEOUS) IMPLANT
TOWEL OR 17X26 10 PK STRL BLUE (TOWEL DISPOSABLE) ×2 IMPLANT
TRAY FOL W/BAG SLVR 16FR STRL (SET/KITS/TRAYS/PACK) IMPLANT
TRAY FOLEY W/BAG SLVR 16FR LF (SET/KITS/TRAYS/PACK)
TRENDGUARD 450 HYBRID PRO PACK (MISCELLANEOUS) ×2
TROCAR PORT AIRSEAL 5X120 (TROCAR) ×2 IMPLANT
WATER STERILE IRR 1000ML POUR (IV SOLUTION) ×2 IMPLANT

## 2021-09-27 NOTE — Anesthesia Preprocedure Evaluation (Addendum)
Anesthesia Evaluation  Patient identified by MRN, date of birth, ID band Patient awake    Reviewed: Allergy & Precautions, NPO status , Patient's Chart, lab work & pertinent test results  History of Anesthesia Complications Negative for: history of anesthetic complications  Airway Mallampati: III  TM Distance: >3 FB Neck ROM: Full    Dental  (+) Dental Advisory Given, Teeth Intact   Pulmonary sleep apnea , former smoker,    Pulmonary exam normal        Cardiovascular hypertension, Pt. on medications Normal cardiovascular exam     Neuro/Psych PSYCHIATRIC DISORDERS Anxiety Depression negative neurological ROS     GI/Hepatic Neg liver ROS, GERD  Controlled,  Endo/Other  Morbid obesity  Renal/GU negative Renal ROS     Musculoskeletal negative musculoskeletal ROS (+)   Abdominal (+) + obese,   Peds  Hematology negative hematology ROS (+)   Anesthesia Other Findings   Reproductive/Obstetrics  PCOS Fibroids                              Anesthesia Physical Anesthesia Plan  ASA: 3  Anesthesia Plan: General   Post-op Pain Management: Toradol IV (intra-op)*   Induction: Intravenous  PONV Risk Score and Plan: 3 and Treatment may vary due to age or medical condition, Ondansetron, Dexamethasone and Midazolam  Airway Management Planned: Oral ETT  Additional Equipment: CVP and Ultrasound Guidance Line Placement  Intra-op Plan:   Post-operative Plan: Extubation in OR  Informed Consent: I have reviewed the patients History and Physical, chart, labs and discussed the procedure including the risks, benefits and alternatives for the proposed anesthesia with the patient or authorized representative who has indicated his/her understanding and acceptance.     Dental advisory given  Plan Discussed with: CRNA and Anesthesiologist  Anesthesia Plan Comments: (Unable to obtain sufficient  peripheral IV access after multiple attempts. Single 20ga positional IV in anterior of right wrist. Explained to patient the need for central IV access given potential for bleeding in the procedure, patient expressed understanding and wished to proceed. Central line in OR under general anesthesia.)       Anesthesia Quick Evaluation

## 2021-09-27 NOTE — Op Note (Signed)
09/27/2021 Claudia Wall   Preoperative diagnosis: Symptomatic fibroids with pain and menorrhagia  Postop diagnosis: Same Procedure: da Vinci robot assisted total laparoscopic hysterectomy and bilateral salpingectomy Anesthesia Gen. Endotracheal Surgeon: Dr. Azucena Fallen Assistant: Dr Tiana Loft  IV fluids: 1500 cc LR EBL: 50 cc  Urine output: 081 cc, clear Complications: none Pathology: Fibroid Uterus with cervix and both fallopian tubes- uterus 327 gm  Disposition: PACU, stable Findings: Enlarged fibroid uterus, normal both ovaries and fallopian tubes. Normal gross appearance of bowel and omentum  Procedure:  Indication: --Menorrhagia, dysmenorrhea, fibroid uterus   Complications of surgery including infection, bleeding, damage to internal organs and other surgery related problems including pneumonia, VTE reviewed and informed written consent was obtained. Also reviewed risks from surgical menopause.  She understood and gave informed written consent.  Patient was brought to the operating room with IV running, central line placed in OR due to poor access (see anesthesia record). She received 2  gm Ancef . She underwent general anesthesia without difficulty and was given dorsal lithotomy position, prepped and draped in sterile fashion. Foley catheter was placed. Cervix was exposed with a speculum and anterior lip of the cervix was grasped with tenaculum. Uterus was sounded to 11 cm. A  # *10 Rumi tip and a Medium Koh ring was assembled on the [employer] and entered in the uterine cavity and balloon was inflated to secure it in place. Koh ring was palpated again cervico-vaginal junction. Speculum was removed, tenaculum was left on the cervix.  Gown, gloves changed. Attention was focused on abdomen. Supraumbilical 10 mm vertical incision made with scalpel after injecting Marcaine, fascia dissected, grasped with Kocher's and incised, posterior rectus sheath and peritoneum grasped,  incised, intraabdominal entry confirmed. Purse string stay stitch on 0-Vicryl taken on fascia and Hassan cannula introduced and Vicryl sutures secured on the Hassan cannula. Pneumoperitoneum was begun. Laparoscope was introduced and the peritoneal cavity was evaluated. Omental adhesions noted in right upper/mid abdomen but visualization was normal.  Large fibroid uterus noted with good mobility. Trendelenburg position given.  Port sited marked and injected with Marcaine. Two Robotic cannulas inserted on right side and one on the left and one Airseal on the left side under vision. Robot was docked from right. Arm 1 had vessel sealer, arm 2 camera, arm 3 endoscissors and arm 4 prograsp.    Dr. Benjie Karvonen scrubbed out and went for surgical console. Dr Murrell Redden remained at the bedside.Enlarged uterus noted, both ovaries, tubes and ureters normal.   Uterus was deviated to the patient's right. The left mesosalpinx desiccated and cut, followed by left utero-ovarian ligament and left round ligament. Then broad ligament desiccated and cut. Anterior bladder broad ligament was opened and incised. Posterior broad ligament incised up to the uterosacral ligament and left uterine vessels skeletonized. Uterus was deviated to the left. Right mesosalpinx desiccated and cut, then right utero-ovarian ligament and round ligament desiccated and cut. Anterior broad ligament was incised to create bladder flap, bladder was pushed away by blunt and sharp dissection with excellent hemostasis. Koh ring impression at cervicovaginal junction was seen well anteriorly. Right posterior broad ligament dissected, right Uterine vessels skeletonized. Right uterine vessels were desiccated and cut. Uterus was deviated to the right and the left uterine vessels were desiccated and cut. Vaginal occluder was inflated. Colpotomy was begun starting from midline anteriorly coming to the left and right and then circumferentially staying above the uterosacral  ligaments posteriorly.  Uterus was bivalved at the fundus and right 1/2  was allowed to slide over the left half as specimen was removed from vaginal opening. Fibroid uterus, cervix, both tubes were pulled out of the vaginal opening and handed off and vaginal occluder placed back.   Vaginal cut edges were evaluated for hemostasis which was excellent. Irrigation was performed pedicles appeared dry. Robotic instruments switched for needle driver in arm 3 in place of scissors. 0- V-lock used for suturing vaginal cuff, closed without complications. Hemostasis noted.   Irrigation was performed, all pedicles appeared to be hemostatic. Robotic instruments were removed. Robot was de-docked. Patient was made supine. Lap'scope was reintroduced, hemostasis was excellent but there was slight oozing at left adnexal dissection, so Arista was sprayed. Robotic cannulas were removed under vision. Laparoscope and central port removed under vision. The stay sutures at the fascia tied together with excellent fascial closure. Skin approximated with subcuticular stitches on 4-0 Vicryl. Dermabond was applied. No vaginal bleeding noted on vaginal exam at the end. All counts were correct x2.  No complications. Patient tolerated procedure well and was reversed from anesthesia and brought to the PACU stable condition. Foley catheter to be removed in PACU.  Dr Benjie Karvonen was the surgeon for entire case.    --Azucena Fallen MD Lynnell Chad

## 2021-09-27 NOTE — Anesthesia Procedure Notes (Signed)
Procedure Name: Intubation Date/Time: 09/27/2021 1:46 PM  Performed by: Clearnce Sorrel, CRNAPre-anesthesia Checklist: Patient identified, Emergency Drugs available, Suction available and Patient being monitored Patient Re-evaluated:Patient Re-evaluated prior to induction Oxygen Delivery Method: Circle System Utilized Preoxygenation: Pre-oxygenation with 100% oxygen Induction Type: IV induction Ventilation: Mask ventilation without difficulty Laryngoscope Size: Mac and 3 Grade View: Grade I Tube type: Oral Tube size: 7.0 mm Number of attempts: 1 Airway Equipment and Method: Stylet and Oral airway Placement Confirmation: ETT inserted through vocal cords under direct vision, positive ETCO2 and breath sounds checked- equal and bilateral Secured at: 23 cm Tube secured with: Tape Dental Injury: Teeth and Oropharynx as per pre-operative assessment

## 2021-09-27 NOTE — Transfer of Care (Signed)
Immediate Anesthesia Transfer of Care Note  Patient: Claudia Wall  Procedure(s) Performed: XI ROBOTIC ASSISTED LAPAROSCOPIC HYSTERECTOMY AND SALPINGECTOMY (Bilateral)  Patient Location: PACU  Anesthesia Type:General  Level of Consciousness: awake, alert  and oriented  Airway & Oxygen Therapy: Patient Spontanous Breathing and Patient connected to face mask oxygen  Post-op Assessment: Report given to RN and Post -op Vital signs reviewed and stable  Post vital signs: Reviewed and stable  Last Vitals:  Vitals Value Taken Time  BP    Temp 36.7 C 09/27/21 1645  Pulse 94 09/27/21 1645  Resp    SpO2      Last Pain:  Vitals:   09/27/21 1200  TempSrc: Oral  PainSc: 0-No pain      Patients Stated Pain Goal: 6 (70/17/79 3903)  Complications: No notable events documented.

## 2021-09-27 NOTE — Anesthesia Procedure Notes (Signed)
Central Venous Catheter Insertion Performed by: Audry Pili, MD, anesthesiologist Start/End6/30/2023 1:56 PM, 09/27/2021 2:04 PM Patient location: OR. Preanesthetic checklist: patient identified, IV checked, risks and benefits discussed, surgical consent, monitors and equipment checked, pre-op evaluation, timeout performed and anesthesia consent Position: Trendelenburg Patient sedated Hand hygiene performed , maximum sterile barriers used  and Seldinger technique used Catheter size: 8 Fr Central line was placed.Double lumen Procedure performed using ultrasound guided technique. Ultrasound Notes:anatomy identified, needle tip was noted to be adjacent to the nerve/plexus identified, no ultrasound evidence of intravascular and/or intraneural injection and image(s) printed for medical record Attempts: 1 Following insertion, line sutured, dressing applied and Biopatch. Post procedure assessment: blood return through all ports, free fluid flow and no air  Patient tolerated the procedure well with no immediate complications.

## 2021-09-27 NOTE — H&P (Signed)
Claudia Wall is an 46 y.o. female. Here for hysterectomy due to fibroids and having severe cramps and heavy bleeding. All options were reviewed and declined all. Desiring difinitive option  G0. Same sex partner. Not planning to have kids.  Nl pap hx. Office endometrial biopsy normal. Office sono - reviewed.   Patient's last menstrual period was 09/17/2021.    Past Medical History:  Diagnosis Date   Alcohol abuse    Anxiety    Constipation    Depression    GERD (gastroesophageal reflux disease)    HTN (hypertension)    Irregular periods/menstrual cycles    Joint pain    Lactose intolerance    Leg edema    goes away when props legs up per pt   lower Back pain    Mastitis    h/o right   Obesity    PCOS (polycystic ovarian syndrome)    Pre-diabetes    PTSD (post-traumatic stress disorder)    Sleep apnea    no cpap used due to cost per pt   Tobacco abuse    Uterine fibroid     Past Surgical History:  Procedure Laterality Date   CHOLECYSTECTOMY  03/31/2000   INCISION AND DRAINAGE BREAST ABSCESS     yrs ago   IR RADIOLOGIST EVAL & MGMT  04/03/2021   IR RADIOLOGIST EVAL & MGMT  04/26/2021   left carpal tunnel release surgery  05/2018    Family History  Problem Relation Age of Onset   Cancer Mother    Hypertension Mother    Heart disease Mother    Depression Mother    Bipolar disorder Mother    Schizophrenia Mother    Alcoholism Mother    Drug abuse Mother    Obesity Mother    Hypertension Father    Diabetes Father    Heart attack Maternal Aunt    Heart failure Maternal Grandmother     Social History:  reports that she quit smoking about 17 months ago. Her smoking use included cigarettes. She has a 6.25 pack-year smoking history. She has never used smokeless tobacco. She reports current alcohol use of about 3.0 standard drinks of alcohol per week. She reports that she does not use drugs.  Allergies:  Allergies  Allergen Reactions   Hydrocodone     itching    Tylenol [Acetaminophen]     itching    Medications Prior to Admission  Medication Sig Dispense Refill Last Dose   amLODipine (NORVASC) 10 MG tablet Take 1 tablet (10 mg total) by mouth daily. 90 tablet 3    Blood Pressure Monitoring (ADULT BLOOD PRESSURE CUFF LG) KIT Check your blood pressure once daily. Keep a written log and bring to your next doctor's appointment. 1 kit 0    diclofenac (VOLTAREN) 75 MG EC tablet Take 1 tablet (75 mg total) by mouth 2 (two) times daily. (Patient taking differently: Take 75 mg by mouth daily.) 30 tablet 2    lidocaine (LIDODERM) 5 % Place 1 patch onto the skin daily. Remove & Discard patch within 12 hours or as directed by MD (Patient not taking: Reported on 09/18/2021) 30 patch 0 Not Taking   olmesartan (BENICAR) 40 MG tablet Take 1 tablet (40 mg total) by mouth daily. (Patient taking differently: Take 40 mg by mouth at bedtime.) 90 tablet 3    Semaglutide-Weight Management (WEGOVY) 0.5 MG/0.5ML SOAJ Inject 0.5 mg into the skin every 7 (seven) days. (Patient not taking: Reported on 09/18/2021) 2 mL  0 Not Taking   Semaglutide-Weight Management (WEGOVY) 1.7 MG/0.75ML SOAJ Inject 1.7 mg into the skin once a week. (Patient not taking: Reported on 09/18/2021) 3 mL 0 Not Taking   spironolactone (ALDACTONE) 25 MG tablet Take 1 tablet (25 mg total) by mouth daily. (Patient taking differently: Take 25 mg by mouth at bedtime.) 30 tablet 5    traMADol (ULTRAM) 50 MG tablet Take 1 tablet (50 mg total) by mouth every 8 (eight) hours as needed for 5 days. 15 tablet 1     Review of Systems  Height '5\' 3"'  (1.6 m), weight 114.7 kg, last menstrual period 09/17/2021. Physical Exam   A&O x 3, no acute distress. Pleasant HEENT neg, no thyromegaly Lungs CTA bilat CV RRR, S1S2 normal Abdo soft, non tender, non acute Extr no edema/ tenderness Pelvic Uterus 10 wks with large central IM myoma, normal cervix and adnexa  Results for orders placed or performed during the hospital  encounter of 09/27/21 (from the past 24 hour(s))  Pregnancy, urine POC     Status: None   Collection Time: 09/27/21 11:30 AM  Result Value Ref Range   Preg Test, Ur NEGATIVE NEGATIVE    No results found.  Assessment/Plan: 46 yo female, with symptomatic uterine fibroids, here for daVinci assisted total laparoscopic hysterectomy and bilateral salpingectomy. Ovarian conservation planned since not other risks  Risks/complications of surgery reviewed incl infection, bleeding, damage to internal organs including bladder, bowels, ureters, blood vessels, other risks from anesthesia, VTE and delayed complications of any surgery, complications in future surgery reviewed. Also discussed neonatal complications incl difficult delivery, laceration, vacuum assistance, TTN etc. Pt understands and agrees, all concerns addressed.     Elveria Royals 09/27/2021, 11:42 AM

## 2021-09-27 NOTE — Anesthesia Postprocedure Evaluation (Signed)
Anesthesia Post Note  Patient: Claudia Wall  Procedure(s) Performed: XI ROBOTIC ASSISTED LAPAROSCOPIC HYSTERECTOMY AND SALPINGECTOMY (Bilateral)     Patient location during evaluation: PACU Anesthesia Type: General Level of consciousness: sedated Pain management: pain level controlled Vital Signs Assessment: post-procedure vital signs reviewed and stable Respiratory status: spontaneous breathing and respiratory function stable Cardiovascular status: stable Postop Assessment: no apparent nausea or vomiting Anesthetic complications: no   No notable events documented.  Last Vitals:  Vitals:   09/27/21 1715 09/27/21 1730  BP: (!) 154/95 (!) 141/85  Pulse: 89 90  Resp: 16 16  Temp:  36.7 C  SpO2: 95% 96%    Last Pain:  Vitals:   09/27/21 1730  TempSrc:   PainSc: Asleep                 Ilija Maxim DANIEL

## 2021-09-28 ENCOUNTER — Other Ambulatory Visit (HOSPITAL_COMMUNITY): Payer: Self-pay

## 2021-09-28 DIAGNOSIS — N920 Excessive and frequent menstruation with regular cycle: Secondary | ICD-10-CM | POA: Diagnosis not present

## 2021-09-28 LAB — CBC
HCT: 37.1 % (ref 36.0–46.0)
Hemoglobin: 12.8 g/dL (ref 12.0–15.0)
MCH: 30.5 pg (ref 26.0–34.0)
MCHC: 34.5 g/dL (ref 30.0–36.0)
MCV: 88.3 fL (ref 80.0–100.0)
Platelets: 362 10*3/uL (ref 150–400)
RBC: 4.2 MIL/uL (ref 3.87–5.11)
RDW: 12.6 % (ref 11.5–15.5)
WBC: 10.9 10*3/uL — ABNORMAL HIGH (ref 4.0–10.5)
nRBC: 0.3 % — ABNORMAL HIGH (ref 0.0–0.2)

## 2021-09-28 MED ORDER — KETOROLAC TROMETHAMINE 30 MG/ML IJ SOLN
INTRAMUSCULAR | Status: AC
  Filled 2021-09-28: qty 1

## 2021-09-28 MED ORDER — IBUPROFEN 600 MG PO TABS
600.0000 mg | ORAL_TABLET | Freq: Four times a day (QID) | ORAL | 0 refills | Status: DC
  Filled 2021-09-28: qty 30, 8d supply, fill #0

## 2021-09-28 MED ORDER — TRAMADOL HCL 50 MG PO TABS
ORAL_TABLET | ORAL | Status: AC
  Filled 2021-09-28: qty 1

## 2021-09-28 MED ORDER — TRAMADOL HCL 50 MG PO TABS
50.0000 mg | ORAL_TABLET | Freq: Four times a day (QID) | ORAL | 0 refills | Status: AC | PRN
  Filled 2021-09-28: qty 20, 5d supply, fill #0

## 2021-09-28 NOTE — Discharge Summary (Signed)
Physician Discharge Summary  Patient ID: Claudia Wall MRN: [id number] DOB/AGE: [birth year] 46 y.o.  Admit date: 09/27/2021 Discharge date: 09/28/2021  Admission Diagnoses:Fibroids, pain, menorrhagia   Discharge Diagnoses:  S/P da Vinci assisted total laparoscopic hysterectomy and bilateral salpingectomy   Discharged Condition: good  Hospital Course: uncomplicated . Patient reports tolerating PO and no problems voiding.  Tramadol controlling pain. Oxycodone caused itching and Tylenol does the same   Discharge Exam: Blood pressure (!) 142/66, pulse 93, temperature 98.4 F (36.9 C), resp. rate 18, height 5' 3" (1.6 m), weight 115.1 kg, last menstrual period 09/17/2021, SpO2 97 %.    General: alert Resp: clear to auscultation bilaterally Cardio: regular rate and rhythm, S1, S2 normal, no murmur, click, rub or gallop GI: soft, non-tender; bowel sounds normal; no masses,  no organomegaly and incision: clean and dry Extremities: extremities normal, atraumatic, no cyanosis or edema Vaginal Bleeding: none       Latest Ref Rng & Units 09/28/2021    2:42 AM 09/25/2021    9:00 AM    CBC  WBC 4.0 - 10.5 K/uL 10.9  7.3     Hemoglobin 12.0 - 15.0 g/dL 12.8  13.3     Hematocrit 36.0 - 46.0 % 37.1  39.9     Platelets 150 - 400 K/uL 362  374        Disposition: Discharge disposition: 01-Home or Self Care       Discharge Instructions     Call MD for:   Complete by: As directed    Vaginal bleeding more than spotting   Call MD for:  difficulty breathing, headache or visual disturbances   Complete by: As directed    Call MD for:  hives   Complete by: As directed    Call MD for:  persistant dizziness or light-headedness   Complete by: As directed    Call MD for:  persistant nausea and vomiting   Complete by: As directed    Call MD for:  redness, tenderness, or signs of infection (pain, swelling, redness, odor or green/yellow discharge around incision site)   Complete by: As directed     Call MD for:  severe uncontrolled pain   Complete by: As directed    Call MD for:  temperature >100.4   Complete by: As directed    Diet - low sodium heart healthy   Complete by: As directed    Driving Restrictions   Complete by: As directed    2 weeks   Increase activity slowly   Complete by: As directed    Lifting restrictions   Complete by: As directed    No lifting over 20 lbs for 8 weeks   No dressing needed   Complete by: As directed    Sexual Activity Restrictions   Complete by: As directed    Nothing in the vagina for 8 weeks      Allergies as of 09/28/2021       Reactions   Hydrocodone    itching   Oxycodone Itching   Tylenol [acetaminophen]    itching        Medication List     TAKE these medications    Adult Blood Pressure Cuff Lg Kit Check your blood pressure once daily. Keep a written log and bring to your next doctor's appointment.   amLODipine 10 MG tablet Commonly known as: NORVASC Take 1 tablet (10 mg total) by mouth daily.   diclofenac 75 MG EC tablet Commonly known  as: VOLTAREN Take 1 tablet (75 mg total) by mouth 2 (two) times daily. What changed: when to take this   ibuprofen 400 MG tablet Commonly known as: ADVIL Take 400 mg by mouth every 6 (six) hours as needed. What changed: Another medication with the same name was added. Make sure you understand how and when to take each.   ibuprofen 600 MG tablet Commonly known as: ADVIL Take 1 tablet (600 mg total) by mouth every 6 (six) hours. What changed: You were already taking a medication with the same name, and this prescription was added. Make sure you understand how and when to take each.   lidocaine 5 % Commonly known as: Lidoderm Place 1 patch onto the skin daily. Remove & Discard patch within 12 hours or as directed by MD   olmesartan 40 MG tablet Commonly known as: BENICAR Take 1 tablet (40 mg total) by mouth daily. What changed: when to take this   spironolactone 25 MG  tablet Commonly known as: ALDACTONE Take 1 tablet (25 mg total) by mouth daily. What changed: when to take this   traMADol 50 MG tablet Commonly known as: ULTRAM Take 1 tablet (50 mg total) by mouth every 8 (eight) hours as needed for 5 days. What changed: Another medication with the same name was added. Make sure you understand how and when to take each.   traMADol 50 MG tablet Commonly known as: ULTRAM Take 1 tablet (50 mg total) by mouth every 6 (six) hours as needed for up to 5 days for moderate pain. What changed: You were already taking a medication with the same name, and this prescription was added. Make sure you understand how and when to take each.   Wegovy 1.7 MG/0.75ML Soaj Generic drug: Semaglutide-Weight Management Inject 1.7 mg into the skin once a week.   Wegovy 0.5 MG/0.5ML Soaj Generic drug: Semaglutide-Weight Management Inject 0.5 mg into the skin every 7 (seven) days.               Discharge Care Instructions  (From admission, onward)           Start     Ordered   09/28/21 0000  No dressing needed        09/28/21 0757             Signed: Elveria Royals 09/28/2021, 8:01 AM

## 2021-09-28 NOTE — Progress Notes (Signed)
1 Day Post-Op Procedure(s) (LRB): XI ROBOTIC ASSISTED LAPAROSCOPIC HYSTERECTOMY AND SALPINGECTOMY (Bilateral)  Subjective: Patient reports tolerating PO and no problems voiding.  Tramadol controlling pain. Oxycodone caused itching and Tylenol does the same   Objective: I have reviewed patient's vital signs, intake and output, medications, and labs. BP (!) 142/66 (BP Location: Left Arm)   Pulse 93   Temp 98.4 F (36.9 C)   Resp 18   Ht '5\' 3"'$  (1.6 m)   Wt 115.1 kg   LMP 09/17/2021   SpO2 97%   BMI 44.96 kg/m   General: alert Resp: clear to auscultation bilaterally Cardio: regular rate and rhythm, S1, S2 normal, no murmur, click, rub or gallop GI: soft, non-tender; bowel sounds normal; no masses,  no organomegaly and incision: clean and dry Extremities: extremities normal, atraumatic, no cyanosis or edema Vaginal Bleeding: none     Latest Ref Rng & Units 09/28/2021    2:42 AM 09/25/2021    9:00 AM   CBC  WBC 4.0 - 10.5 K/uL 10.9  7.3    Hemoglobin 12.0 - 15.0 g/dL 12.8  13.3    Hematocrit 36.0 - 46.0 % 37.1  39.9    Platelets 150 - 400 K/uL 362  374       Assessment: s/p Procedure(s): XI ROBOTIC ASSISTED LAPAROSCOPIC HYSTERECTOMY AND SALPINGECTOMY (Bilateral): stable, progressing well, and tolerating diet Surgical findings dw pt  Plan: Discharge home Post op care dw pt and warning ss. RTO 2 wks in office   LOS: 0 days    Elveria Royals, MD 09/28/2021, 7:59 AM

## 2021-10-02 ENCOUNTER — Encounter (HOSPITAL_BASED_OUTPATIENT_CLINIC_OR_DEPARTMENT_OTHER): Payer: Self-pay

## 2021-10-02 ENCOUNTER — Encounter (HOSPITAL_BASED_OUTPATIENT_CLINIC_OR_DEPARTMENT_OTHER): Payer: Self-pay | Admitting: Obstetrics & Gynecology

## 2021-10-05 ENCOUNTER — Other Ambulatory Visit (HOSPITAL_COMMUNITY): Payer: Self-pay

## 2021-10-08 LAB — SURGICAL PATHOLOGY

## 2021-10-09 ENCOUNTER — Other Ambulatory Visit (HOSPITAL_BASED_OUTPATIENT_CLINIC_OR_DEPARTMENT_OTHER): Payer: Self-pay | Admitting: Cardiovascular Disease

## 2021-10-09 ENCOUNTER — Other Ambulatory Visit (HOSPITAL_COMMUNITY): Payer: Self-pay

## 2021-10-15 ENCOUNTER — Other Ambulatory Visit (HOSPITAL_COMMUNITY): Payer: Self-pay

## 2021-10-21 ENCOUNTER — Other Ambulatory Visit (HOSPITAL_COMMUNITY): Payer: Self-pay

## 2021-10-21 ENCOUNTER — Other Ambulatory Visit: Payer: Self-pay | Admitting: Pharmacist Clinician (PhC)/ Clinical Pharmacy Specialist

## 2021-10-21 MED ORDER — WEGOVY 0.5 MG/0.5ML ~~LOC~~ SOAJ
0.5000 mg | SUBCUTANEOUS | 1 refills | Status: DC
  Filled 2021-10-21 – 2021-11-06 (×3): qty 2, 28d supply, fill #0
  Filled 2022-02-11: qty 2, 28d supply, fill #1

## 2021-10-21 MED ORDER — WEGOVY 1 MG/0.5ML ~~LOC~~ SOAJ
1.0000 mg | SUBCUTANEOUS | 1 refills | Status: DC
  Filled 2021-10-21: qty 2, fill #0
  Filled 2021-10-21 – 2021-12-18 (×3): qty 2, 28d supply, fill #0
  Filled 2022-02-11: qty 2, 28d supply, fill #1

## 2021-10-21 NOTE — Telephone Encounter (Signed)
PA  [id number] approved through 05/08/22 for maximum of 6 fills.

## 2021-11-01 ENCOUNTER — Other Ambulatory Visit (HOSPITAL_COMMUNITY): Payer: Self-pay

## 2021-11-01 ENCOUNTER — Encounter (HOSPITAL_BASED_OUTPATIENT_CLINIC_OR_DEPARTMENT_OTHER): Payer: Self-pay | Admitting: Cardiovascular Disease

## 2021-11-01 ENCOUNTER — Ambulatory Visit (HOSPITAL_BASED_OUTPATIENT_CLINIC_OR_DEPARTMENT_OTHER): Payer: 59 | Admitting: Cardiovascular Disease

## 2021-11-01 DIAGNOSIS — E7849 Other hyperlipidemia: Secondary | ICD-10-CM | POA: Diagnosis not present

## 2021-11-01 DIAGNOSIS — I10 Essential (primary) hypertension: Secondary | ICD-10-CM | POA: Diagnosis not present

## 2021-11-01 DIAGNOSIS — G4733 Obstructive sleep apnea (adult) (pediatric): Secondary | ICD-10-CM | POA: Diagnosis not present

## 2021-11-01 MED ORDER — METOPROLOL SUCCINATE ER 25 MG PO TB24
25.0000 mg | ORAL_TABLET | Freq: Every day | ORAL | 1 refills | Status: DC
  Filled 2021-11-01 – 2021-12-14 (×2): qty 90, 90d supply, fill #0

## 2021-11-01 MED ORDER — METOPROLOL SUCCINATE ER 25 MG PO TB24: 25.0000 mg | ORAL_TABLET | Freq: Every day | ORAL | 1 refills | Status: DC

## 2021-11-01 NOTE — Assessment & Plan Note (Addendum)
She has OSA but has been unable to afford CPAP.  We will contact our sleep medicine team to see if there are any options.  In the meantime, she will work on diet and exercise.

## 2021-11-01 NOTE — Progress Notes (Signed)
Cardiology Office Note:    Date:  11/03/2021   ID:  Claudia Wall, DOB 02/04/1976, MRN [id number]  PCP:  Alen Bleacher, MD   [place] Providers Cardiologist:  None     Referring MD: Alen Bleacher, MD   No chief complaint on file.  History of Present Illness:    Claudia Wall is a 46 y.o. female with a hx of hypertension, morbid obesity, GERD, obstructive sleep apnea, PCOS, alcohol abuse, tobacco abuse, anxiety, depression, and PTSD, here for follow-up. She was initially seen 12/2020 for the evaluation of elevated blood pressure and palpitations. She saw Dr. Garwin Brothers 11/2020. At that visit her blood pressure was 132/92 on HCTZ. She presented to Urgent Care 01/11/2021 with episodes of palpitations occurring 5-6 times per week with typical duration of a second. EKG at that time showed normal sinus rhythm. It was recommended she follow-up with her PCP for possible Holter monitor. She then saw Gerlene Fee, DO 01/14/2021 and reported associated shortness of breath, dizziness, and weakness with her palpitations. [name] requested a Holter monitor and a cardiology referral. At that visit her blood pressure was also elevated at 160/91.  Her palpitations have occurred since August 2019 or 2020 right after receiving a COVID vaccination. At that time she felt dizziness, shortness of breath, and felt "spaced out." Since then, her palpitations have lasted for about one second, and occur sporadically when she is at rest or walking. She had associated dizziness and shortness of breath as well. She is not taking meloxicam because it exacerbates her palpitations. Also, she reported being dx with hypertension when she was 46 yo. She was initially prescribed doxazosin in her early 20's, but she did not take this. A prior sleep study did not reveal sleep apnea. At her last appointment, her blood pressure was elevated so amlodipine was added. She planned to work on diet and exercise. Her renin,  aldosterone, and thyroid were all normal. Her PCP ordered a ambulatory monitor. The 3-day Zio monitor 01/2021 showed sinus rhythm, rare PACs and PVCs, and 13 beats NSVT. Echo revealed LVEF 60-65% and grade 1 diastolic dysfunction. Renal artery dopplers were normal. Sleep study 02/2021 revealed obstructive sleep apnea.   Her palpitations had improved and just occurred after she drank alcohol. Her blood pressure was improving but not at goal so she was started on tribenzor. She was restarted on CPAP. She saw our pharmacist and was started on wegovy 06/20/2021.   At her last appointment she was doing well and reported at home blood pressures averaging in the 366'Q systolic. She had also recently started [facility]. She was encouraged to keep working on diet and exercise. On 09/25/2021 she called the office and reported blood pressures of 160-189/100-109. Her amlodipine was increased to 10 mg daily.  Today, she continues to experience flutters which is also associated with feeling strange or funny. Sometimes they cause her to feel as though she is beginning to pass out, but she denies any syncopal episodes. The flutters are occurring 1-2 times a day, randomly while sitting or at rest. There may be several days between episodes with no flutters. Usually her symptoms are very brief, a second or two at most. After drinking alcohol such as one glass of wine, she subsequently develops rapid heart palpitations. Unless she mixes her alcohol with water or diet sprite she is unable to tolerate it. At home she reports her blood pressures have been running low, as low as below 947 systolic and diastolic. Currently  she is taking spironolactone, amlodipine, and olmesartan. Lately she has noticed minor LE edema at times, generally not as severe as it used to be. Her swelling is typically worse when she goes to the beach. Usually she does well with staying hydrated. Additionally she complains of some numbness/tingling sensations in  her hands. While recovering from her recent hysterectomy she has not been as active or exercising. When she did exercise previously, she denies any associated flutters or anginal symptoms at those times. Recently last Saturday she restarted Wegovy. For a time it was unavailable at her pharmacy. She works at [employer]. She denies any chest pain, or shortness of breath. No lightheadedness, headaches, syncope, orthopnea, or PND.   Past Medical History:  Diagnosis Date   Alcohol abuse    Hx, occ on weekends   Anxiety    Constipation    Depression    GERD (gastroesophageal reflux disease)    HTN (hypertension)    Irregular periods/menstrual cycles    Joint pain    Lactose intolerance    Leg edema    goes away when props legs up per pt   lower Back pain    Mastitis    h/o right   Obesity    PCOS (polycystic ovarian syndrome)    Pre-diabetes    PTSD (post-traumatic stress disorder)    Sleep apnea    no cpap used due to cost per pt   Tobacco abuse    Stopped 2021   Uterine fibroid     Past Surgical History:  Procedure Laterality Date   CHOLECYSTECTOMY  03/31/2000   INCISION AND DRAINAGE BREAST ABSCESS     yrs ago   IR RADIOLOGIST EVAL & MGMT  04/03/2021   IR RADIOLOGIST EVAL & MGMT  04/26/2021   left carpal tunnel release surgery  05/2018   ROBOTIC ASSISTED LAPAROSCOPIC HYSTERECTOMY AND SALPINGECTOMY Bilateral 09/27/2021   Procedure: XI ROBOTIC ASSISTED LAPAROSCOPIC HYSTERECTOMY AND SALPINGECTOMY;  Surgeon: Azucena Fallen, MD;  Location: [facility];  Service: Gynecology;  Laterality: Bilateral;    Current Medications: Current Meds  Medication Sig   amLODipine (NORVASC) 10 MG tablet Take 1 tablet (10 mg total) by mouth daily.   Blood Pressure Monitoring (ADULT BLOOD PRESSURE CUFF LG) KIT Check your blood pressure once daily. Keep a written log and bring to your next doctor's appointment.   ibuprofen (ADVIL) 400 MG tablet Take 400 mg by mouth  every 6 (six) hours as needed.   ibuprofen (ADVIL) 600 MG tablet Take 1 tablet (600 mg total) by mouth every 6 (six) hours.   lidocaine (LIDODERM) 5 % Place 1 patch onto the skin daily. Remove & Discard patch within 12 hours or as directed by MD   olmesartan (BENICAR) 40 MG tablet Take 1 tablet (40 mg total) by mouth daily. (Patient taking differently: Take 40 mg by mouth at bedtime.)   Semaglutide-Weight Management (WEGOVY) 0.5 MG/0.5ML SOAJ Inject 0.5 mg into the skin every 7 (seven) days.   Semaglutide-Weight Management (WEGOVY) 1 MG/0.5ML SOAJ Inject 1 mg into the skin every 7 (seven) days.   Semaglutide-Weight Management (WEGOVY) 1.7 MG/0.75ML SOAJ Inject 1.7 mg into the skin once a week.   spironolactone (ALDACTONE) 25 MG tablet Take 1 tablet (25 mg total) by mouth daily. (Patient taking differently: Take 25 mg by mouth at bedtime.)   [DISCONTINUED] metoprolol succinate (TOPROL XL) 25 MG 24 hr tablet Take 1 tablet (25 mg total) by mouth daily.     Allergies:  Hydrocodone, Oxycodone, and Tylenol [acetaminophen]   Social History   Socioeconomic History   Marital status: Significant Other    Spouse name: Deforest Hoyles   Number of children: Not on file   Years of education: Not on file   Highest education level: Not on file  Occupational History   Not on file  Tobacco Use   Smoking status: Former    Packs/day: 0.25    Years: 25.00    Total pack years: 6.25    Types: Cigarettes    Quit date: 2022    Years since quitting: 1.5   Smokeless tobacco: Never   Tobacco comments:    "used to smoke 1/2 to 1ppd depending on stress"  Vaping Use   Vaping Use: Never used  Substance and Sexual Activity   Alcohol use: Yes    Alcohol/week: 3.0 standard drinks of alcohol    Types: 3 Glasses of wine per week    Comment: occassional   Drug use: No   Sexual activity: Yes    Partners: Female    Birth control/protection: Other-see comments, None    Comment: for past 21 years  Other Topics  Concern   Not on file  Social History Narrative   Is in a same sex relationship x 1 year. Very supportive and loving per pt and partner.    Social Determinants of Health   Financial Resource Strain: Not on file  Food Insecurity: Not on file  Transportation Needs: Not on file  Physical Activity: Not on file  Stress: Not on file  Social Connections: Not on file     Family History: The patient's family history includes Alcoholism in her mother; Bipolar disorder in her mother; Cancer in her mother; Depression in her mother; Diabetes in her father; Drug abuse in her mother; Heart attack in her maternal aunt; Heart disease in her mother; Heart failure in her maternal grandmother; Hypertension in her father and mother; Obesity in her mother; Schizophrenia in her mother.  ROS:   Please see the history of present illness. (+) Palpitations (+) Minor LE edema (+) Numbness/tingling of bilateral hands All other systems reviewed and are negative.  EKGs/Labs/Other Studies Reviewed:    The following studies were reviewed today:  Echo 02/06/2021  1. Left ventricular ejection fraction, by estimation, is 60 to 65%. The left ventricle has normal function. The left ventricle has no regional wall motion abnormalities. Left ventricular diastolic parameters are consistent with Grade I diastolic  dysfunction (impaired relaxation). The average left ventricular global longitudinal strain is -17.9 %. The global longitudinal strain is normal.   2. Right ventricular systolic function is normal. The right ventricular  size is normal. There is normal pulmonary artery systolic pressure.   3. The mitral valve is normal in structure. No evidence of mitral valve  regurgitation. No evidence of mitral stenosis.   4. The aortic valve is tricuspid. Aortic valve regurgitation is not  visualized. No aortic stenosis is present.   5. The inferior vena cava is normal in size with greater than 50%  respiratory variability,  suggesting right atrial pressure of 3 mmHg.   Renal Artery Duplex 02/06/2021 Right: Normal size right kidney. Normal right Resisitive Index.         Normal cortical thickness of right kidney. No evidence of         right renal artery stenosis. RRV flow present.  Left:  Normal size of left kidney. Normal left Resistive Index.  Normal cortical thickness of the left kidney. No evidence of         left renal artery stenosis. LRV flow present.  Mesenteric:  Normal Celiac artery and Superior Mesenteric artery findings.   Monitor 01/30/2021 3 Day Zio Monitor   Quality: Fair.  Baseline artifact. Predominant rhythm: Sinus rhythm Average heart rate: 88 bpm Max heart rate: 156 bpm Min heart rate: 55 bpm Pauses >2.5 seconds: None   13 beats NSVT <1% PACs and PVCs Patient triggered events corresponded to PVCs,  sinus rhythm, and sinus tachycardia   EKG:   EKG is personally reviewed. 11/01/2021:  EKG was not ordered.   Recent Labs: 01/11/2021: ALT 17; TSH 1.780 09/25/2021: BUN 12; Creatinine, Ser 0.97; Potassium 3.9; Sodium 138 09/28/2021: Hemoglobin 12.8; Platelets 362   Recent Lipid Panel    Component Value Date/Time   CHOL 154 01/07/2019 1654   TRIG 87 01/07/2019 1654   HDL 46 01/07/2019 1654   CHOLHDL 3.3 01/07/2019 1654   CHOLHDL 3.9 Ratio 06/12/2010 1723   VLDL 24 06/12/2010 1723   LDLCALC 92 01/07/2019 1654        Physical Exam:    VS:  BP 124/72 (BP Location: Right Arm, Patient Position: Sitting, Cuff Size: Large)   Pulse 92   Ht _0  (1.6 m)   Wt 255 lb 3.2 oz (115.8 kg)   SpO2 98%   BMI 45.21 kg/m  , BMI Body mass index is 45.21 kg/m. GENERAL:  Well appearing HEENT: Pupils equal round and reactive, fundi not visualized, oral mucosa unremarkable NECK:  No jugular venous distention, waveform within normal limits, carotid upstroke brisk and symmetric, no bruits, no thyromegaly LUNGS:  Clear to auscultation bilaterally HEART:  RRR.  PMI not displaced or  sustained,S1 and S2 within normal limits, no S3, no S4, no clicks, no rubs, no murmurs ABD:  Flat, positive bowel sounds normal in frequency in pitch, no bruits, no rebound, no guarding, no midline pulsatile mass, no hepatomegaly, no splenomegaly EXT:  2 plus pulses throughout, no edema, no cyanosis no clubbing SKIN:  No rashes no nodules NEURO:  Cranial nerves II through XII grossly intact, motor grossly intact throughout PSYCH:  Cognitively intact, oriented to person place and time   ASSESSMENT:    1. Essential hypertension   2. OSA (obstructive sleep apnea)   3. Morbid obesity ([place])   4. Other hyperlipidemia      PLAN:    Essential hypertension BP has been much better controlled.  Continue amlodipine, olmesartan and spironolactone.   OSA (obstructive sleep apnea) She has OSA but has been unable to afford CPAP.  We will contact our sleep medicine team to see if there are any options.  In the meantime, she will work on diet and exercise.   Morbid obesity ([place]) She is going to work on increasing exercise.  Goal is <150 minutes per week.  She will restart Wegovy.  Other hyperlipidemia Working on diet and exercise as above.    Screening for Secondary Hypertension:      01/25/2021   11:58 AM 05/01/2021    5:50 PM  Causes  Drugs/Herbals Screened Screened     - Comments limits salt, no caffeine Needs to limit sodium  Renovascular HTN Screened Screened     - Comments Check renal artery Dopplers Negative on Dopplers  Sleep Apnea Screened Screened     - Comments snoring, apnea, daytime somnolence CPAP titration pening  Thyroid Disease Screened Screened  Hyperaldosteronism Screened Screened     -  Comments Check renal and and aldosterone   Pheochromocytoma N/A N/A  Cushing's Syndrome N/A N/A  Hyperparathyroidism Screened Screened  Coarctation of the Aorta Screened Screened     - Comments BP symmetric   Compliance Screened Screened    Relevant Labs/Studies:    Latest Ref  Rng & Units 09/25/2021    9:00 AM 07/05/2021    1:58 PM 01/11/2021    8:54 AM  Basic Labs  Sodium 135 - 145 mmol/L 138  133  137   Potassium 3.5 - 5.1 mmol/L 3.9  4.4  4.1   Creatinine 0.44 - 1.00 mg/dL 0.97  1.08  1.00        Latest Ref Rng & Units 01/11/2021    8:54 AM 04/29/2017   11:38 AM  Thyroid   TSH 0.450 - 4.500 uIU/mL 1.780  0.889        Latest Ref Rng & Units 01/25/2021    1:08 PM  Renin/Aldosterone   Aldosterone 0.0 - 30.0 ng/dL 5.9   Renin 0.167 - 5.380 ng/mL/hr 1.082   Aldos/Renin Ratio 0.0 - 30.0 5.5              02/06/2021    9:35 AM  Renovascular   Renal Artery [name] Completed Yes     Disposition: FU with Ommie Degeorge C. Oval Linsey, MD, [facility] in 1 month by virtual visit.   Medication Adjustments/Labs and Tests Ordered: Current medicines are reviewed at length with the patient today.  Concerns regarding medicines are outlined above.   No orders of the defined types were placed in this encounter.  Meds ordered this encounter  Medications   DISCONTD: metoprolol succinate (TOPROL XL) 25 MG 24 hr tablet    Sig: Take 1 tablet (25 mg total) by mouth daily.    Dispense:  90 tablet    Refill:  1   metoprolol succinate (TOPROL XL) 25 MG 24 hr tablet    Sig: Take 1 tablet by mouth daily.    Dispense:  90 tablet    Refill:  1   Patient Instructions  Medication Instructions:  START METOPROLOL SUCC 25 MG DAILY   *If you need a refill on your cardiac medications before your next appointment, please call your pharmacy*  Lab Work: NONE  Testing/Procedures: NONE  Follow-Up: 9/8/20223 10:00 AM WITH DR Marinette   WILL HAVE WANDA OR SLEEP TEAM REACH OUT TO YOU REGARDING CPAP        I,Mathew Stumpf,acting as a scribe for Skeet Latch, MD.,have documented all relevant documentation on the behalf of Skeet Latch, MD,as directed by  Skeet Latch, MD while in the presence of Skeet Latch, MD.  I, [place] Oval Linsey, MD have reviewed all  documentation for this visit.  The documentation of the exam, diagnosis, procedures, and orders on 11/03/2021 are all accurate and complete.  Signed, Skeet Latch, MD  11/03/2021 8:29 AM    [facility]

## 2021-11-01 NOTE — Assessment & Plan Note (Signed)
BP has been much better controlled.  Continue amlodipine, olmesartan and spironolactone.

## 2021-11-01 NOTE — Patient Instructions (Signed)
Medication Instructions:  START METOPROLOL SUCC 25 MG DAILY   *If you need a refill on your cardiac medications before your next appointment, please call your pharmacy*  Lab Work: NONE  Testing/Procedures: NONE  Follow-Up: 9/8/20223 10:00 AM WITH DR Skillman   WILL HAVE WANDA OR SLEEP TEAM REACH OUT TO YOU REGARDING CPAP

## 2021-11-03 ENCOUNTER — Encounter (HOSPITAL_BASED_OUTPATIENT_CLINIC_OR_DEPARTMENT_OTHER): Payer: Self-pay | Admitting: Cardiovascular Disease

## 2021-11-03 NOTE — Assessment & Plan Note (Signed)
Working on diet and exercise as above.  

## 2021-11-03 NOTE — Assessment & Plan Note (Signed)
She is going to work on increasing exercise.  Goal is <150 minutes per week.  She will restart Wegovy.

## 2021-11-05 NOTE — Telephone Encounter (Signed)
Patient seen by Dr Oval Linsey in office 8/4

## 2021-11-06 ENCOUNTER — Other Ambulatory Visit (HOSPITAL_COMMUNITY): Payer: Self-pay

## 2021-11-06 MED ORDER — IBUPROFEN 600 MG PO TABS
600.0000 mg | ORAL_TABLET | Freq: Two times a day (BID) | ORAL | 0 refills | Status: DC
  Filled 2021-11-06: qty 20, 10d supply, fill #0

## 2021-11-19 ENCOUNTER — Other Ambulatory Visit (HOSPITAL_COMMUNITY): Payer: Self-pay

## 2021-11-20 ENCOUNTER — Other Ambulatory Visit (HOSPITAL_COMMUNITY): Payer: Self-pay

## 2021-11-20 MED ORDER — AMLODIPINE BESYLATE 10 MG PO TABS
10.0000 mg | ORAL_TABLET | Freq: Every day | ORAL | 3 refills | Status: DC
  Filled 2021-11-20: qty 90, 90d supply, fill #0

## 2021-12-05 ENCOUNTER — Telehealth: Payer: Self-pay | Admitting: Family

## 2021-12-05 NOTE — Telephone Encounter (Signed)
Patient requesting her appointment 9/11 be virtual due to her work schedule.

## 2021-12-06 ENCOUNTER — Ambulatory Visit (HOSPITAL_BASED_OUTPATIENT_CLINIC_OR_DEPARTMENT_OTHER): Payer: 59 | Admitting: Cardiovascular Disease

## 2021-12-06 NOTE — Telephone Encounter (Signed)
Ok to make virtual

## 2021-12-06 NOTE — Telephone Encounter (Signed)
Virtual is fine. TY!  Claudia Dubonnet, NP

## 2021-12-09 ENCOUNTER — Encounter (HOSPITAL_BASED_OUTPATIENT_CLINIC_OR_DEPARTMENT_OTHER): Payer: Self-pay | Admitting: Family

## 2021-12-09 ENCOUNTER — Telehealth (INDEPENDENT_AMBULATORY_CARE_PROVIDER_SITE_OTHER): Payer: 59 | Admitting: Family

## 2021-12-09 ENCOUNTER — Encounter (HOSPITAL_BASED_OUTPATIENT_CLINIC_OR_DEPARTMENT_OTHER): Payer: Self-pay

## 2021-12-09 VITALS — HR 93 | Ht 63.0 in | Wt 249.0 lb

## 2021-12-09 DIAGNOSIS — I10 Essential (primary) hypertension: Secondary | ICD-10-CM

## 2021-12-09 DIAGNOSIS — E782 Mixed hyperlipidemia: Secondary | ICD-10-CM

## 2021-12-09 DIAGNOSIS — G4733 Obstructive sleep apnea (adult) (pediatric): Secondary | ICD-10-CM | POA: Diagnosis not present

## 2021-12-09 DIAGNOSIS — Z6841 Body Mass Index (BMI) 40.0 and over, adult: Secondary | ICD-10-CM | POA: Diagnosis not present

## 2021-12-09 DIAGNOSIS — R002 Palpitations: Secondary | ICD-10-CM

## 2021-12-09 NOTE — Progress Notes (Unsigned)
Virtual Visit via Video Note   Because of Leonila Speranza Chiarelli's co-morbid illnesses, she is at least at moderate risk for complications without adequate follow up.  This format is felt to be most appropriate for this patient at this time.  All issues noted in this document were discussed and addressed.  A limited physical exam was performed with this format.  Please refer to the patient's chart for her consent to telehealth for [facility]       Date:  12/10/2021   ID:  Augustina Mood, DOB [birth year], MRN [id number] The patient was identified using 2 identifiers.  Patient Location: Other:  Work Provider Location: Office/Clinic   PCP:  Alen Bleacher, [place] Providers Cardiologist:  Skeet Latch, MD     Evaluation Performed:  Follow-Up Visit  Chief Complaint:  Palpitations  History of Present Illness:    SEQUOYA HOGSETT is a 46 y.o. female with HTN, morbid obesity, GERD, OSA, PCOS, alcohol abuse, anxiety, depression, PTSD.  Initial evaluation October 2022 for elevated blood pressure and palpitations.  Initial palpitations August 2019 or 2020 right after receiving COVID vaccination.  Described as dizziness, shortness of breath, "spaced out ".  She reported being diagnosed with hypertension when she was 46 years old.  Initially prescribed doxazosin in her early 3s but did not take this.  Prior sleep study did not reveal sleep apnea.  Renal and aldosterone and thyroid were all normal.  3-day ZIO monitor November 2022 NSR, rare PAC/PVC, 13 beat NSVT.  Echo revealed LVEF 38-17%, grade 1 diastolic dysfunction.  Renal artery Doppler is normal.  Repeat sleep study 02/2021 revealed obstructive sleep apnea and was recommended for CPAP.  Palpitations improved with reduced alcohol intake.  Started on Wegovy for weight loss.  Lasting 11/01/2021.  She noted persistent palpitations and was started on Toprol 25 mg daily.  BP was at goal on amlodipine, olmesartan,  spironolactone.  She was unable to afford CPAP and was referred to sleep medicine team to see if there are any additional options.  She was restarted on Wegovy.  Follow-up today via video visit.  Has not yet started metoprolol she had concerns regarding the medication.  Reassurance provided. Still with daily palpitations that are short.  Denies chest pain.  Stable exertional dyspnea which she attributes to inactivity.  No formal exercise routine.  Tolerating Wegovy without difficulty. BP at home has been good per her report but does not provide specific readings.  Past Medical History:  Diagnosis Date   Alcohol abuse    Hx, occ on weekends   Anxiety    Constipation    Depression    GERD (gastroesophageal reflux disease)    HTN (hypertension)    Irregular periods/menstrual cycles    Joint pain    Lactose intolerance    Leg edema    goes away when props legs up per pt   lower Back pain    Mastitis    h/o right   Obesity    PCOS (polycystic ovarian syndrome)    Pre-diabetes    PTSD (post-traumatic stress disorder)    Sleep apnea    no cpap used due to cost per pt   Tobacco abuse    Stopped 2021   Uterine fibroid    Past Surgical History:  Procedure Laterality Date   CHOLECYSTECTOMY  03/31/2000   INCISION AND DRAINAGE BREAST ABSCESS     yrs ago   IR RADIOLOGIST EVAL & MGMT  04/03/2021   IR RADIOLOGIST EVAL & MGMT  04/26/2021   left carpal tunnel release surgery  05/2018   ROBOTIC ASSISTED LAPAROSCOPIC HYSTERECTOMY AND SALPINGECTOMY Bilateral 09/27/2021   Procedure: XI ROBOTIC ASSISTED LAPAROSCOPIC HYSTERECTOMY AND SALPINGECTOMY;  Surgeon: Azucena Fallen, MD;  Location: [facility];  Service: Gynecology;  Laterality: Bilateral;     Current Meds  Medication Sig   amLODipine (NORVASC) 10 MG tablet Take 1 tablet (10 mg total) by mouth daily.   Blood Pressure Monitoring (ADULT BLOOD PRESSURE CUFF LG) KIT Check your blood pressure once daily. Keep a written log and  bring to your next doctor's appointment.   ibuprofen (ADVIL) 400 MG tablet Take 400 mg by mouth every 6 (six) hours as needed.   ibuprofen (ADVIL) 600 MG tablet Take 1 tablet (600 mg total) by mouth 2 (two) times daily.   lidocaine (LIDODERM) 5 % Place 1 patch onto the skin daily. Remove & Discard patch within 12 hours or as directed by MD   olmesartan (BENICAR) 40 MG tablet Take 1 tablet (40 mg total) by mouth daily. (Patient taking differently: Take 40 mg by mouth at bedtime.)   Semaglutide-Weight Management (WEGOVY) 0.5 MG/0.5ML SOAJ Inject 0.5 mg into the skin every 7 (seven) days.   Semaglutide-Weight Management (WEGOVY) 1 MG/0.5ML SOAJ Inject 1 mg into the skin every 7 (seven) days.   Semaglutide-Weight Management (WEGOVY) 1.7 MG/0.75ML SOAJ Inject 1.7 mg into the skin once a week.   spironolactone (ALDACTONE) 25 MG tablet Take 1 tablet (25 mg total) by mouth daily. (Patient taking differently: Take 25 mg by mouth at bedtime.)     Allergies:   Hydrocodone, Oxycodone, and Tylenol [acetaminophen]   Social History   Tobacco Use   Smoking status: Former    Packs/day: 0.25    Years: 25.00    Total pack years: 6.25    Types: Cigarettes    Quit date: 2022    Years since quitting: 1.6   Smokeless tobacco: Never   Tobacco comments:    "used to smoke 1/2 to 1ppd depending on stress"  Vaping Use   Vaping Use: Never used  Substance Use Topics   Alcohol use: Yes    Alcohol/week: 3.0 standard drinks of alcohol    Types: 3 Glasses of wine per week    Comment: occassional   Drug use: No     Family Hx: The patient's family history includes Alcoholism in her mother; Bipolar disorder in her mother; Cancer in her mother; Depression in her mother; Diabetes in her father; Drug abuse in her mother; Heart attack in her maternal aunt; Heart disease in her mother; Heart failure in her maternal grandmother; Hypertension in her father and mother; Obesity in her mother; Schizophrenia in her  mother.  ROS:   Please see the history of present illness.     All other systems reviewed and are negative.   Prior CV studies:   The following studies were reviewed today:  Echo 02/06/2021  1. Left ventricular ejection fraction, by estimation, is 60 to 65%. The left ventricle has normal function. The left ventricle has no regional wall motion abnormalities. Left ventricular diastolic parameters are consistent with Grade I diastolic  dysfunction (impaired relaxation). The average left ventricular global longitudinal strain is -17.9 %. The global longitudinal strain is normal.   2. Right ventricular systolic function is normal. The right ventricular  size is normal. There is normal pulmonary artery systolic pressure.   3. The mitral valve is normal  in structure. No evidence of mitral valve  regurgitation. No evidence of mitral stenosis.   4. The aortic valve is tricuspid. Aortic valve regurgitation is not  visualized. No aortic stenosis is present.   5. The inferior vena cava is normal in size with greater than 50%  respiratory variability, suggesting right atrial pressure of 3 mmHg.    Renal Artery Duplex 02/06/2021 Right: Normal size right kidney. Normal right Resisitive Index.         Normal cortical thickness of right kidney. No evidence of         right renal artery stenosis. RRV flow present.  Left:  Normal size of left kidney. Normal left Resistive Index.         Normal cortical thickness of the left kidney. No evidence of         left renal artery stenosis. LRV flow present.  Mesenteric:  Normal Celiac artery and Superior Mesenteric artery findings.    Monitor 01/30/2021 3 Day Zio Monitor   Quality: Fair.  Baseline artifact. Predominant rhythm: Sinus rhythm Average heart rate: 88 bpm Max heart rate: 156 bpm Min heart rate: 55 bpm Pauses >2.5 seconds: None   13 beats NSVT <1% PACs and PVCs Patient triggered events corresponded to PVCs,  sinus rhythm, and sinus  tachycardia  Labs/Other Tests and Data Reviewed:    EKG:  No ECG reviewed.  Recent Labs: 01/11/2021: ALT 17; TSH 1.780 09/25/2021: BUN 12; Creatinine, Ser 0.97; Potassium 3.9; Sodium 138 09/28/2021: Hemoglobin 12.8; Platelets 362   Recent Lipid Panel Lab Results  Component Value Date/Time   CHOL 154 01/07/2019 04:54 PM   TRIG 87 01/07/2019 04:54 PM   HDL 46 01/07/2019 04:54 PM   CHOLHDL 3.3 01/07/2019 04:54 PM   CHOLHDL 3.9 Ratio 06/12/2010 05:23 PM   LDLCALC 92 01/07/2019 04:54 PM    Wt Readings from Last 3 Encounters:  12/09/21 249 lb (112.9 kg)  11/01/21 255 lb 3.2 oz (115.8 kg)  09/27/21 253 lb 12.8 oz (115.1 kg)         Objective:    Vital Signs:  Pulse 93   Ht '5\' 3"'  (1.6 m)   Wt 249 lb (112.9 kg)   BMI 44.11 kg/m    VITAL SIGNS:  reviewed GEN:  no acute distress RESPIRATORY:  normal respiratory effort, symmetric expansion CARDIOVASCULAR:  no peripheral edema  ASSESSMENT & PLAN:    HTN -reports BP at home well controlled.  Continue present regimen amlodipine, olmesartan, spironolactone.  Palpitations-previously prescribed Toprol 25 mg daily but has not a patient from the pharmacy.  Encouraged to do so.  Discussed to avoid caffeine/alcohol, manage stress well, stay physically active, stay well-hydrated to prevent palpitations.  She plans to pick up Toprol 25 mg this week.  OSA -OSA but has not been admitted to afford CPAP.  We will again contact our sleep medicine team to see if there are any options.  In the meantime we will work on diet and exercise.  Morbid obesity - Weight loss via diet and exercise encouraged. Discussed the impact being overweight would have on cardiovascular risk. Continue [id number].   HLD - Increase diet and exercise, as above.      Time:   Today, I have spent 12 minutes with the patient with telehealth technology discussing the above problems.     Medication Adjustments/Labs and Tests Ordered: Current medicines are reviewed at length  with the patient today.  Concerns regarding medicines are outlined above.   Tests Ordered:  No orders of the defined types were placed in this encounter.   Medication Changes: No orders of the defined types were placed in this encounter.   Follow Up:  Virtual Visit   in 2-3 months  Signed, Loel Dubonnet, NP  12/10/2021 10:30 AM    [place]

## 2021-12-09 NOTE — Patient Instructions (Addendum)
Medication Instructions:   Recommend picking up Metoprolol on Wednesday when you are off.   This is a beta blocker that helps to relax your heart and prevent palpitations.   It will not stop your heart.  It may lower your resting heart rate just a little.   *If you need a refill on your cardiac medications before your next appointment, please call your pharmacy*  Lab Work: None ordered today.   Testing/Procedures: None ordered today.   Follow-Up: At [facility], you and your health needs are our priority.  As part of our continuing mission to provide you with exceptional heart care, we have created designated Provider Care Teams.  These Care Teams include your primary Cardiologist (physician) and Advanced Practice Providers (APPs -  Physician Assistants and Nurse Practitioners) who all work together to provide you with the care you need, when you need it.  We recommend signing up for the patient portal called "MyChart".  Sign up information is provided on this After Visit Summary.  MyChart is used to connect with patients for Virtual Visits (Telemedicine).  Patients are able to view lab/test results, encounter notes, upcoming appointments, etc.  Non-urgent messages can be sent to your provider as well.   To learn more about what you can do with MyChart, go to [email or web address].    Your next appointment:   2 -3 month(s)  The format for your next appointment:   In Person or Virtual  Provider:   Skeet Latch, MD or Laurann Montana, NP   Other Instructions To prevent palpitations: Make sure you are adequately hydrated.  Avoid and/or limit caffeine containing beverages like soda or tea. Exercise regularly.  Manage stress well. Some over the counter medications can cause palpitations such as Benadryl, AdvilPM, TylenolPM. Regular Advil or Tylenol do not cause palpitations.    Heart Healthy Diet Recommendations: A low-salt diet is recommended. Meats should be  grilled, baked, or boiled. Avoid fried foods. Focus on lean protein sources like fish or chicken with vegetables and fruits. The American Heart Association is a Microbiologist!  American Heart Association Diet and Lifeystyle Recommendations   Exercise recommendations: The American Heart Association recommends 150 minutes of moderate intensity exercise weekly. Try 30 minutes of moderate intensity exercise 4-5 times per week. This could include walking, jogging, or swimming.

## 2021-12-14 ENCOUNTER — Other Ambulatory Visit (HOSPITAL_COMMUNITY): Payer: Self-pay

## 2021-12-16 ENCOUNTER — Other Ambulatory Visit (HOSPITAL_COMMUNITY): Payer: Self-pay

## 2021-12-16 ENCOUNTER — Encounter (HOSPITAL_BASED_OUTPATIENT_CLINIC_OR_DEPARTMENT_OTHER): Payer: Self-pay

## 2021-12-18 ENCOUNTER — Other Ambulatory Visit (HOSPITAL_COMMUNITY): Payer: Self-pay

## 2021-12-23 ENCOUNTER — Ambulatory Visit (HOSPITAL_BASED_OUTPATIENT_CLINIC_OR_DEPARTMENT_OTHER): Payer: 59 | Admitting: Cardiovascular Disease

## 2022-01-10 ENCOUNTER — Other Ambulatory Visit (HOSPITAL_COMMUNITY): Payer: Self-pay

## 2022-01-13 ENCOUNTER — Other Ambulatory Visit (HOSPITAL_COMMUNITY): Payer: Self-pay

## 2022-01-17 ENCOUNTER — Other Ambulatory Visit (HOSPITAL_COMMUNITY): Payer: Self-pay

## 2022-01-18 ENCOUNTER — Other Ambulatory Visit (HOSPITAL_COMMUNITY): Payer: Self-pay

## 2022-02-11 ENCOUNTER — Other Ambulatory Visit (HOSPITAL_BASED_OUTPATIENT_CLINIC_OR_DEPARTMENT_OTHER): Payer: Self-pay | Admitting: Cardiovascular Disease

## 2022-02-11 ENCOUNTER — Other Ambulatory Visit (HOSPITAL_COMMUNITY): Payer: Self-pay

## 2022-02-11 MED ORDER — SPIRONOLACTONE 25 MG PO TABS
25.0000 mg | ORAL_TABLET | Freq: Every day | ORAL | 3 refills | Status: DC
  Filled 2022-02-11: qty 30, 30d supply, fill #0

## 2022-02-14 ENCOUNTER — Other Ambulatory Visit (HOSPITAL_COMMUNITY): Payer: Self-pay

## 2022-02-17 ENCOUNTER — Other Ambulatory Visit (HOSPITAL_COMMUNITY): Payer: Self-pay

## 2022-02-18 ENCOUNTER — Other Ambulatory Visit (HOSPITAL_COMMUNITY): Payer: Self-pay

## 2022-02-19 ENCOUNTER — Other Ambulatory Visit (HOSPITAL_COMMUNITY): Payer: Self-pay

## 2022-02-25 ENCOUNTER — Ambulatory Visit
Admission: EM | Admit: 2022-02-25 | Discharge: 2022-02-25 | Disposition: A | Discharge status: Home / Self Care | Payer: 59 | Attending: Internal Medicine | Admitting: Internal Medicine

## 2022-02-25 ENCOUNTER — Ambulatory Visit (INDEPENDENT_AMBULATORY_CARE_PROVIDER_SITE_OTHER): Payer: 59

## 2022-02-25 ENCOUNTER — Telehealth: Payer: Self-pay

## 2022-02-25 DIAGNOSIS — R071 Chest pain on breathing: Secondary | ICD-10-CM | POA: Diagnosis not present

## 2022-02-25 DIAGNOSIS — R0789 Other chest pain: Secondary | ICD-10-CM

## 2022-02-25 DIAGNOSIS — R059 Cough, unspecified: Secondary | ICD-10-CM

## 2022-02-25 NOTE — Telephone Encounter (Signed)
Patient calls nurse line requesting to schedule appointment for chest pain. She reports that she has been having chest pain for the last two days. States that pain is worsened by deep breaths and sneezing. Rates pain at 4/10. Describes as soreness/tightness. She denies recent heavy lifting or muscle strain. Denies sick symptoms or known sick exposures. She also reports having issues with constipation and gas. She has been taking gas-x, however, chest pain remains constant.   Pain does not radiate and no nausea or vomiting. Spoke with Dr. Owens Shark regarding patient. Due to patient's history and present symptoms, Dr. Owens Shark recommended that patient be evaluated today in the urgent care.   Advised patient of provider recommendation. Patient verbalized understanding.   Talbot Grumbling, RN

## 2022-02-25 NOTE — ED Provider Notes (Signed)
[facility]    CSN: [id number] Arrival date & time: 02/25/22  1740      History   Chief Complaint Chief Complaint  Patient presents with   Chest Pain    HPI Claudia Wall is a 46 y.o. female.   Patient presents with left-sided chest pain that seems to radiate around to her left back that has been present for 3 days.  She reports it is intermittent and feels like a "gas pain" or "a muscle strain".  Pain worsens with inspiration.  Patient denies any obvious injury to the area or any recent heavy lifting.  Patient denies any associated shortness of breath, palpitations, headache, dizziness, blurred vision, nausea, vomiting.  She reports that she is followed by cardiology given that "part of her heart does not pump efficiently".   Chest Pain   Past Medical History:  Diagnosis Date   Alcohol abuse    Hx, occ on weekends   Anxiety    Constipation    Depression    GERD (gastroesophageal reflux disease)    HTN (hypertension)    Irregular periods/menstrual cycles    Joint pain    Lactose intolerance    Leg edema    goes away when props legs up per pt   lower Back pain    Mastitis    h/o right   Obesity    PCOS (polycystic ovarian syndrome)    Pre-diabetes    PTSD (post-traumatic stress disorder)    Sleep apnea    no cpap used due to cost per pt   Tobacco abuse    Stopped 2021   Uterine fibroid     Patient Active Problem List   Diagnosis Date Noted   S/P laparoscopic hysterectomy 09/27/2021   Uterine fibroid 09/27/2021   OSA (obstructive sleep apnea) 05/07/2021   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   Anxiety 10/20/2020   Cervical dysplasia 06/09/2019   Uterine polyp 04/20/2019   Chronic bilateral low back pain without sciatica 09/23/2018   Obesity, Class III, BMI 40-49.9 (morbid obesity) ([place]) 09/23/2018   Pelvic pain 07/18/2018   Bilateral carpal tunnel syndrome 02/09/2018   Acute lumbar myofascial strain 12/29/2017   Chronic post-traumatic stress  disorder (PTSD) 12/29/2017   Morbid obesity ([place]) 11/13/2017   Insomnia due to stress 11/09/2017   Chronic toe pain, left foot 11/09/2017   Other hyperlipidemia 05/13/2017   Vitamin D deficiency 05/13/2017   Prediabetes 05/13/2017   Enlarged thyroid 04/29/2017   PCOS (polycystic ovarian syndrome) 04/29/2017   Irregular periods/menstrual cycles 02/12/2017   Vaginal dryness 02/12/2017   Left carpal tunnel syndrome 07/08/2016   Carpal tunnel syndrome, right upper limb 07/07/2013   Palpitations 10/27/2011   Dyshidrotic eczema 11/06/2008   Tobacco use disorder 11/01/2008   BMI 45.0-49.9, adult ([place]) 02/03/2007   Essential hypertension 05/28/2006   GERD (gastroesophageal reflux disease) 05/28/2006    Past Surgical History:  Procedure Laterality Date   CHOLECYSTECTOMY  03/31/2000   INCISION AND DRAINAGE BREAST ABSCESS     yrs ago   IR RADIOLOGIST EVAL & MGMT  04/03/2021   IR RADIOLOGIST EVAL & MGMT  04/26/2021   left carpal tunnel release surgery  05/2018   ROBOTIC ASSISTED LAPAROSCOPIC HYSTERECTOMY AND SALPINGECTOMY Bilateral 09/27/2021   Procedure: XI ROBOTIC ASSISTED LAPAROSCOPIC HYSTERECTOMY AND SALPINGECTOMY;  Surgeon: Azucena Fallen, MD;  Location: [facility];  Service: Gynecology;  Laterality: Bilateral;    OB History     Gravida  2   Para  Term      Preterm      AB      Living  2      SAB      IAB      Ectopic      Multiple      Live Births           Obstetric Comments  Vag delivery x 2          Home Medications    Prior to Admission medications   Medication Sig Start Date End Date Taking? Authorizing Provider  amLODipine (NORVASC) 10 MG tablet Take 1 tablet (10 mg total) by mouth daily. 09/25/21   Loel Dubonnet, NP  Blood Pressure Monitoring (ADULT BLOOD PRESSURE CUFF LG) KIT Check your blood pressure once daily. Keep a written log and bring to your next doctor's appointment. 08/23/20   Alcus Dad, MD  ibuprofen  (ADVIL) 400 MG tablet Take 400 mg by mouth every 6 (six) hours as needed.    [provider]  ibuprofen (ADVIL) 600 MG tablet Take 1 tablet (600 mg total) by mouth 2 (two) times daily. 11/06/21     lidocaine (LIDODERM) 5 % Place 1 patch onto the skin daily. Remove & Discard patch within 12 hours or as directed by your provider. 08/07/21   Aundra Dubin, PA-C  metoprolol succinate (TOPROL XL) 25 MG 24 hr tablet Take 1 tablet by mouth daily. Patient not taking: Reported on 12/09/2021 11/01/21   Skeet Latch, MD  olmesartan (BENICAR) 40 MG tablet Take 1 tablet (40 mg total) by mouth daily. Patient taking differently: Take 40 mg by mouth at bedtime. 05/24/21   Skeet Latch, MD  Semaglutide-Weight Management [facility]) 0.5 MG/0.5ML SOAJ Inject 0.5 mg into the skin every 7 (seven) days. 10/21/21   Skeet Latch, MD  Semaglutide-Weight Management [facility]) 1 MG/0.5ML SOAJ Inject 1 mg into the skin every 7 (seven) days. 10/21/21   Skeet Latch, MD  Semaglutide-Weight Management [facility]) 1.7 MG/0.75ML SOAJ Inject 1.7 mg into the skin once a week. 08/30/21   Skeet Latch, MD  spironolactone (ALDACTONE) 25 MG tablet Take 1 tablet (25 mg total) by mouth daily. 02/11/22   Loel Dubonnet, NP    Family History Family History  Problem Relation Age of Onset   Cancer Mother    Hypertension Mother    Heart disease Mother    Depression Mother    Bipolar disorder Mother    Schizophrenia Mother    Alcoholism Mother    Drug abuse Mother    Obesity Mother    Hypertension Father    Diabetes Father    Heart attack Maternal Aunt    Heart failure Maternal Grandmother     Social History Social History   Tobacco Use   Smoking status: Former    Packs/day: 0.25    Years: 25.00    Total pack years: 6.25    Types: Cigarettes    Quit date: 2022    Years since quitting: 1.9   Smokeless tobacco: Never   Tobacco comments:    "used to smoke 1/2 to 1ppd depending on stress"  Vaping Use    Vaping Use: Never used  Substance Use Topics   Alcohol use: Yes    Alcohol/week: 3.0 standard drinks of alcohol    Types: 3 Glasses of wine per week    Comment: occassional   Drug use: No     Allergies   Hydrocodone, Oxycodone, and Tylenol [acetaminophen]   Review  of Systems Review of Systems Per HPI  Physical Exam Triage Vital Signs ED Triage Vitals  Enc Vitals Group     BP 02/25/22 1927 116/78     Pulse Rate 02/25/22 1927 95     Resp 02/25/22 1927 16     Temp 02/25/22 1925 98.1 F (36.7 C)     Temp Source 02/25/22 1925 Oral     SpO2 02/25/22 1927 99 %     Weight --      Height --      Head Circumference --      [place] Flow --      Pain Score 02/25/22 1923 4     Pain Loc --      Pain Edu? --      Excl. in [place]? --    No data found.  Updated Vital Signs BP 116/78   Pulse 95   Temp 98.1 F (36.7 C) (Oral)   Resp 16   LMP  (LMP Unknown) Comment: PARTIAL HYSTERECTOMY  SpO2 99%   Visual Acuity Right Eye Distance:   Left Eye Distance:   Bilateral Distance:    Right Eye Near:   Left Eye Near:    Bilateral Near:     Physical Exam Constitutional:      General: She is not in acute distress.    Appearance: Normal appearance. She is not toxic-appearing or diaphoretic.  HENT:     Head: Normocephalic and atraumatic.  Eyes:     Extraocular Movements: Extraocular movements intact.     Conjunctiva/sclera: Conjunctivae normal.  Cardiovascular:     Rate and Rhythm: Normal rate and regular rhythm.     Pulses: Normal pulses.     Heart sounds: Normal heart sounds.  Pulmonary:     Effort: Pulmonary effort is normal. No respiratory distress.     Breath sounds: Normal breath sounds.  Chest:     Chest wall: No tenderness.  Musculoskeletal:       Back:     Comments: Tenderness to palpation to left upper thoracic back.  No obvious direct spinal tenderness, crepitus, step-off.  No swelling or discoloration noted.  Neurological:     General: No focal deficit  present.     Mental Status: She is alert and oriented to person, place, and time. Mental status is at baseline.  Psychiatric:        Mood and Affect: Mood normal.        Behavior: Behavior normal.        Thought Content: Thought content normal.        Judgment: Judgment normal.      UC Treatments / Results  Labs (all labs ordered are listed, but only abnormal results are displayed) Labs Reviewed - No data to display  EKG   Radiology DG Chest 2 View  Result Date: 02/25/2022 CLINICAL DATA:  Left-sided chest pain 3/10, constant and worsening with deep inspiration and coughing. EXAM: CHEST - 2 VIEW COMPARISON:  PA Lat 05/06/2017. FINDINGS: Both views overpenetrated limiting assessment for subtle infiltrates. No obvious pulmonary infiltrate is seen. No pleural effusion is evident. Heart size, vasculature and mediastinal and hilar silhouettes are normal. There is thoracic spondylosis. No pneumothorax or displaced rib fracture is evident. IMPRESSION: No evidence of acute cardiopulmonary disease. Limited study due to overpenetration. Electronically Signed   By: Telford Nab M.D.   On: 02/25/2022 20:01    Procedures Procedures (including critical care time)  Medications Ordered in UC Medications - No data to  display  Initial Impression / Assessment and Plan / UC Course  I have reviewed the triage vital signs and the nursing notes.  Pertinent labs & imaging results that were available during my care of the patient were reviewed by me and considered in my medical decision making (see chart for details).     EKG completed that showed normal sinus rhythm with nonspecific T wave abnormality.  When compared to previous EKGs, it appears unremarkable.  Discussed with patient that there is limited resources here in urgent care to evaluate chest pain.  Patient was advised to go to the ER for further evaluation and management.  Patient refused to go to the ER.  Risks associated with not going to  the ER were discussed with patient.  Patient voiced understanding and accepted risks.   Chest x-ray was completed given patient refused to go to the ER.  It was negative for any acute cardiopulmonary process.  Differential diagnoses include musculoskeletal pain, GERD, gas related pain, acute coronary syndrome.  Low suspicion for acute coronary syndrome as HEAR is less than 0.  Recommended supportive care including safe over-the-counter pain relievers and ice application.  Strict ER precautions given.  Advised patient to follow-up with her cardiologist tomorrow to schedule appointment for further evaluation.  Patient verbalized understanding and was agreeable with plan. Final Clinical Impressions(s) / UC Diagnoses   Final diagnoses:  Other chest pain     Discharge Instructions      Your EKG was fairly unremarkable and your chest x-ray was normal.  Recommend that you follow-up with your heart doctor tomorrow and go to the ER if symptoms persist or worsen.    ED Prescriptions   None    PDMP not reviewed this encounter.   Teodora Medici, [place] 02/25/22 2038

## 2022-02-25 NOTE — Discharge Instructions (Signed)
Your EKG was fairly unremarkable and your chest x-ray was normal.  Recommend that you follow-up with your heart doctor tomorrow and go to the ER if symptoms persist or worsen.

## 2022-02-25 NOTE — ED Triage Notes (Signed)
[facility] to [facility] with [facility] of cp for 2 days pt reports pain is left sided and feels like she strained a muscle but does not recall any ways she could have injuried it. Pt reports pain is 3/10 and is constant and worsens with deep breaths and coughing/.

## 2022-02-28 ENCOUNTER — Ambulatory Visit (INDEPENDENT_AMBULATORY_CARE_PROVIDER_SITE_OTHER): Payer: 59 | Admitting: Student

## 2022-02-28 ENCOUNTER — Other Ambulatory Visit (HOSPITAL_COMMUNITY): Payer: Self-pay

## 2022-02-28 VITALS — BP 106/62 | HR 81 | Ht 63.0 in | Wt 237.8 lb

## 2022-02-28 DIAGNOSIS — K5903 Drug induced constipation: Secondary | ICD-10-CM | POA: Diagnosis not present

## 2022-02-28 DIAGNOSIS — M546 Pain in thoracic spine: Secondary | ICD-10-CM | POA: Diagnosis not present

## 2022-02-28 MED ORDER — DICLOFENAC SODIUM 75 MG PO TBEC
75.0000 mg | DELAYED_RELEASE_TABLET | Freq: Two times a day (BID) | ORAL | 0 refills | Status: DC
  Filled 2022-02-28: qty 30, 15d supply, fill #0

## 2022-02-28 MED ORDER — SENNA 8.6 MG PO TABS
1.0000 | ORAL_TABLET | Freq: Every day | ORAL | 0 refills | Status: DC | PRN
  Filled 2022-02-28: qty 120, 120d supply, fill #0

## 2022-02-28 MED ORDER — POLYETHYLENE GLYCOL 3350 17 GM/SCOOP PO POWD
17.0000 g | Freq: Two times a day (BID) | ORAL | 1 refills | Status: DC | PRN
  Filled 2022-02-28: qty 3570, 105d supply, fill #0

## 2022-02-28 NOTE — Patient Instructions (Signed)
It was wonderful to meet you today. Thank you for allowing me to be a part of your care. Below is a short summary of what we discussed at your visit today:  Your back pain is likely muscular.  Commend use of lidocaine patch which you would apply at the affected area.  So you can get over-the-counter Voltaren gel which you can also apply at the affected area.  You can only use 1 already ordered continues but at the same time.  I have also placed medication for MiraLAX and senna which she will use as needed for her constipation.  Please bring all of your medications to every appointment!  If you have any questions or concerns, please do not hesitate to contact us via phone or MyChart message.   Alen Bleacher, MD [facility]

## 2022-02-28 NOTE — Progress Notes (Signed)
    SUBJECTIVE:   CHIEF COMPLAINT / HPI:   Patient is a 46 year old female presenting today for follow-up after visit urgent care for chest pain. At the urgent care EKG show NSR and CXR was negative for cardiopulmonary process. Describe is as a dull pain that is on and off but worse with deep breath and some positional changes. Pain has been going on for few weeks. Nothing has relieved the pain.  She has tried over-the-counter Tums with no relief.   Have used Advil but mostly due to lower back [place] and unsure if this provides any relief.  Denied any shortness of breath, palpitations, headache, dizziness, vision changes. No recent travel. No birthcontrol and reports hysterectomy in June of yesterday. Former smoker, quit 3 years ago and smoke half a pack a day for 30 years.   PERTINENT  PMH / PSH: Reviewed   OBJECTIVE:   BP 106/62   Pulse 81   Ht '5\' 3"'$  (1.6 m)   Wt 237 lb 12.8 oz (107.9 kg)   LMP  (LMP Unknown) Comment: PARTIAL HYSTERECTOMY  SpO2 99%   BMI 42.12 kg/m    Physical Exam General: Alert, well appearing, NAD Cardiovascular: RRR, No Murmurs, Normal S2/S2 Respiratory: CTAB, No wheezing or Rales Abdomen: No distension or tenderness. +BS  MSK: Tenderness on the left upper back, No CVA tenderness Extremities: 5 out of 5 muscle strength in all extremities with sensations intact.   ASSESSMENT/PLAN:   Upper Back pain The nature of her back pain is more consistent with musculoskeletal likely a muscle strain. Low concern for ACS or PE.  No red flag symptoms, denies incontinence or saddle paresthesia. Exam notable for area of tenderness at the left upper back on exam, otherwise the rest of her exam was unremarkable. She has 5/5 muscle strength on all extremities with sensation intact.  -Recommend use of lidocaine patch or Voltaren gel interchangeably  -Encourage use of sports bra -Return precaution reviewed with patient.  Constipation Patient reports usually having 2  bowel movement in a week since starting her Wegovy.  She is reporting about 20 pound weight loss on the medication and no desire discontinued.  Her constipation is likely GI disturbance in the setting of GLP-1 use. -Rx as needed MiraLAX and senna.   Claudia Bleacher, MD [place]

## 2022-03-01 ENCOUNTER — Other Ambulatory Visit (HOSPITAL_COMMUNITY): Payer: Self-pay

## 2022-03-04 ENCOUNTER — Ambulatory Visit: Payer: 59 | Admitting: Family Medicine

## 2022-03-04 ENCOUNTER — Other Ambulatory Visit (HOSPITAL_COMMUNITY): Payer: Self-pay

## 2022-03-05 ENCOUNTER — Other Ambulatory Visit (HOSPITAL_COMMUNITY): Payer: Self-pay

## 2022-03-14 ENCOUNTER — Other Ambulatory Visit (HOSPITAL_COMMUNITY): Payer: Self-pay

## 2022-03-14 ENCOUNTER — Telehealth: Payer: Self-pay | Admitting: Cardiovascular Disease

## 2022-03-14 MED ORDER — SPIRONOLACTONE 25 MG PO TABS
25.0000 mg | ORAL_TABLET | Freq: Every day | ORAL | 0 refills | Status: DC
  Filled 2022-03-14: qty 90, 90d supply, fill #0

## 2022-03-14 MED ORDER — WEGOVY 2.4 MG/0.75ML ~~LOC~~ SOAJ
2.4000 mg | SUBCUTANEOUS | 1 refills | Status: DC
  Filled 2022-03-14: qty 3, 28d supply, fill #0
  Filled 2022-04-11: qty 3, 28d supply, fill #1

## 2022-03-14 NOTE — Telephone Encounter (Signed)
Needs OV scheduled with Dr. Oval Linsey or APP in next 4-6 weeks as overdue for f/u. Okay to refill Spironolactone.  Okay to increase Wegovy to 2.'4mg'$  dose.   Loel Dubonnet, NP

## 2022-03-14 NOTE — Telephone Encounter (Signed)
Spoke with patient and scheduled follow up  Patient has not been taking Metoprolol but blood pressure has been running below 130 at home with SBP in the 90's  She is concerned about low heart rate  Advised to start checking her blood pressure daily, bring readings and machine to follow up

## 2022-03-14 NOTE — Telephone Encounter (Signed)
Patient is requesting a refill for Spironolactone and Wegovy, but she is requesting to have the [facility] increased to the next dose if at all possible. She states that she has taken it for the past month and she does not see any changes. Patient mentions that there was a week that she did not have it and was unable to take it, but she assumes this did not interfere with results.  *STAT* If patient is at the pharmacy, call can be transferred to refill team.   1. Which medications need to be refilled? (please list name of each medication and dose if known)  spironolactone (ALDACTONE) 25 MG tablet Semaglutide-Weight Management (WEGOVY) 1.7 MG/0.75ML SOAJ  2. Which pharmacy/location (including street and city if local pharmacy) is medication to be sent to? [facility]  3. Do they need a 30 day or 90 day supply?   90 day supply

## 2022-03-17 ENCOUNTER — Other Ambulatory Visit (HOSPITAL_COMMUNITY): Payer: Self-pay

## 2022-04-11 ENCOUNTER — Other Ambulatory Visit (HOSPITAL_COMMUNITY): Payer: Self-pay

## 2022-04-15 ENCOUNTER — Encounter (HOSPITAL_BASED_OUTPATIENT_CLINIC_OR_DEPARTMENT_OTHER): Payer: Self-pay | Admitting: Family

## 2022-04-15 ENCOUNTER — Other Ambulatory Visit (HOSPITAL_BASED_OUTPATIENT_CLINIC_OR_DEPARTMENT_OTHER): Payer: Self-pay

## 2022-04-15 ENCOUNTER — Ambulatory Visit (HOSPITAL_BASED_OUTPATIENT_CLINIC_OR_DEPARTMENT_OTHER): Payer: Commercial Managed Care - PPO | Admitting: Family

## 2022-04-15 VITALS — BP 110/70 | HR 88 | Ht 63.0 in | Wt 237.0 lb

## 2022-04-15 DIAGNOSIS — I10 Essential (primary) hypertension: Secondary | ICD-10-CM

## 2022-04-15 DIAGNOSIS — R002 Palpitations: Secondary | ICD-10-CM

## 2022-04-15 DIAGNOSIS — E782 Mixed hyperlipidemia: Secondary | ICD-10-CM | POA: Diagnosis not present

## 2022-04-15 DIAGNOSIS — G4733 Obstructive sleep apnea (adult) (pediatric): Secondary | ICD-10-CM | POA: Diagnosis not present

## 2022-04-15 MED ORDER — AMLODIPINE BESYLATE 5 MG PO TABS
5.0000 mg | ORAL_TABLET | Freq: Every day | ORAL | 3 refills | Status: DC
  Filled 2022-04-15: qty 90, 90d supply, fill #0
  Filled 2022-06-26: qty 90, 90d supply, fill #1
  Filled 2022-10-01 – 2022-11-03 (×2): qty 90, 90d supply, fill #2
  Filled 2023-02-13: qty 90, 90d supply, fill #3

## 2022-04-15 MED ORDER — METOPROLOL SUCCINATE ER 25 MG PO TB24: 12.5000 mg | ORAL_TABLET | Freq: Every day | ORAL | 2 refills | Status: DC

## 2022-04-15 MED ORDER — METOPROLOL SUCCINATE ER 25 MG PO TB24
25.0000 mg | ORAL_TABLET | Freq: Every day | ORAL | 2 refills | Status: DC
  Filled 2022-04-15: qty 90, 90d supply, fill #0
  Filled 2022-07-30: qty 30, 30d supply, fill #0
  Filled 2022-08-27 – 2022-09-04 (×2): qty 30, 30d supply, fill #1
  Filled 2022-09-29: qty 30, 30d supply, fill #2

## 2022-04-15 NOTE — Patient Instructions (Signed)
Medication Instructions:  Your physician has recommended you make the following change in your medication:   START Metoprolol Succinate half tablet (12.'5mg'$ ) tablet daily  REDUCE Amlodipine to '5mg'$  daily  *If you need a refill on your cardiac medications before your next appointment, please call your pharmacy*   Lab Work: Your physician recommends that you return for lab work  fasting lipid panel, CMP, CBC  You can reach out to primary care about having your annual visit to have this collected or have it done at one of the following locations:  Please return for Lab work. You may come to the...   [facility] (3rd floor) [address] [place], [place] [address]  Open: 8am-Noon and 1pm-4:30pm  Please ring the doorbell on the small table when you exit the elevator and the Lab Tech will come get you  [place] [facility] [address] [place], [place], [place] [address] Open: 8am-1pm, then 2pm-4:30pm   [place]- Please see attached locations sheet stapled to your lab work with address and hours.    If you have labs (blood work) drawn today and your tests are completely normal, you will receive your results only by: [place] (if you have MyChart) OR A paper copy in the mail If you have any lab test that is abnormal or we need to change your treatment, we will call you to review the results.   Testing/Procedures: Your EKG today showed normal sinus rhythm which is a good result!  Follow-Up: At [facility], you and your health needs are our priority.  As part of our continuing mission to provide you with exceptional heart care, we have created designated Provider Care Teams.  These Care Teams include your primary Cardiologist (physician) and Advanced Practice Providers (APPs -  Physician Assistants and Nurse Practitioners) who all work together to provide you with the care you need, when you need it.  We recommend signing  up for the patient portal called "MyChart".  Sign up information is provided on this After Visit Summary.  MyChart is used to connect with patients for Virtual Visits (Telemedicine).  Patients are able to view lab/test results, encounter notes, upcoming appointments, etc.  Non-urgent messages can be sent to your provider as well.   To learn more about what you can do with MyChart, go to [email or web address].    Your next appointment:   4 month(s) in person or virtual visit  Provider:   Skeet Latch, MD or Laurann Montana, NP    Other Instructions  We will send you a MyChart message in 2 weeks to check in.   To prevent palpitations: Make sure you are adequately hydrated.  Avoid and/or limit caffeine containing beverages like soda or tea. Exercise regularly.  Manage stress well. Some over the counter medications can cause palpitations such as Benadryl, AdvilPM, TylenolPM. Regular Advil or Tylenol do not cause palpitations.

## 2022-04-15 NOTE — Progress Notes (Signed)
Office Visit    Patient Name: Claudia Wall Date of Encounter: 04/15/2022  PCP:  Alen Bleacher, MD   [place]  Cardiologist:  Skeet Latch, MD  Advanced Practice Provider:  No care team member to display Electrophysiologist:  None      Chief Complaint    Claudia Wall is a 47 y.o. female presents today for hypertension   Past Medical History    Past Medical History:  Diagnosis Date   Alcohol abuse    Hx, occ on weekends   Anxiety    Constipation    Depression    GERD (gastroesophageal reflux disease)    HTN (hypertension)    Irregular periods/menstrual cycles    Joint pain    Lactose intolerance    Leg edema    goes away when props legs up per pt   lower Back pain    Mastitis    h/o right   Obesity    PCOS (polycystic ovarian syndrome)    Pre-diabetes    PTSD (post-traumatic stress disorder)    Sleep apnea    no cpap used due to cost per pt   Tobacco abuse    Stopped 2021   Uterine fibroid    Past Surgical History:  Procedure Laterality Date   CHOLECYSTECTOMY  03/31/2000   INCISION AND DRAINAGE BREAST ABSCESS     yrs ago   IR RADIOLOGIST EVAL & MGMT  04/03/2021   IR RADIOLOGIST EVAL & MGMT  04/26/2021   left carpal tunnel release surgery  05/2018   ROBOTIC ASSISTED LAPAROSCOPIC HYSTERECTOMY AND SALPINGECTOMY Bilateral 09/27/2021   Procedure: XI ROBOTIC ASSISTED LAPAROSCOPIC HYSTERECTOMY AND SALPINGECTOMY;  Surgeon: Azucena Fallen, MD;  [facility];  Service: Gynecology;  Laterality: Bilateral;    Allergies  Allergies  Allergen Reactions   Hydrocodone     itching   Oxycodone Itching   Tylenol [Acetaminophen]     itching    History of Present Illness    Claudia Wall is a 47 y.o. female with a hx of  HTN, morbid obesity, GERD, OSA, PCOS, alcohol abuse, anxiety, depression, PTSD. Last seen via video visit 12/09/21.    Initial evaluation October 2022 for elevated blood pressure and  palpitations.  Initial palpitations August 2019 or 2020 right after receiving COVID vaccination.  Described as dizziness, shortness of breath, "spaced out ".  She reported being diagnosed with hypertension when she was 47 years old.  Initially prescribed doxazosin in her early 26s but did not take this.  Prior sleep study did not reveal sleep apnea.  Renal and aldosterone and thyroid were all normal.  3-day ZIO monitor November 2022 NSR, rare PAC/PVC, 13 beat NSVT.  Echo revealed LVEF 47-82%, grade 1 diastolic dysfunction.  Renal artery Doppler is normal.  Repeat sleep study 02/2021 revealed obstructive sleep apnea and was recommended for CPAP.  Palpitations improved with reduced alcohol intake.  Started on Wegovy for weight loss.   Seen 11/01/2021 noting persistent palpitations and was started on Toprol 25 mg daily.  BP was at goal on amlodipine, olmesartan, spironolactone.  She was unable to afford CPAP and was referred to sleep medicine team to see if there are any additional options.  She was restarted on Wegovy.  Seen 12/09/21 via video visit. She had not yet started Metoprolol due to concerns regarding the medication and was recommended to start.   Urgent care visit 02/25/22 with chest pain worse with inspiration. EKG nonacute.  She declined to be seen in ED. CXR unremarkable.    Follow-up today for follow up independently.  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  She notes she has not started taking metoprolol as she gets blood pressure readings at home 90s/50s-110s/60s and was concerned about hypotension.  When her blood pressure is low she notes associated fatigue.Reports no shortness of breath nor dyspnea on exertion. Reports no chest pain, pressure, or tightness. No edema, orthopnea, PND. Reports continued palpitations.    EKGs/Labs/Other Studies Reviewed:   The following studies were reviewed today:   Echo 02/06/2021  1. Left ventricular ejection fraction, by estimation, is  60 to 65%. The left ventricle has normal function. The left ventricle has no regional wall motion abnormalities. Left ventricular diastolic parameters are consistent with Grade I diastolic  dysfunction (impaired relaxation). The average left ventricular global longitudinal strain is -17.9 %. The global longitudinal strain is normal.   2. Right ventricular systolic function is normal. The right ventricular  size is normal. There is normal pulmonary artery systolic pressure.   3. The mitral valve is normal in structure. No evidence of mitral valve  regurgitation. No evidence of mitral stenosis.   4. The aortic valve is tricuspid. Aortic valve regurgitation is not  visualized. No aortic stenosis is present.   5. The inferior vena cava is normal in size with greater than 50%  respiratory variability, suggesting right atrial pressure of 3 mmHg.    Renal Artery Duplex 02/06/2021 Right: Normal size right kidney. Normal right Resisitive Index.         Normal cortical thickness of right kidney. No evidence of         right renal artery stenosis. RRV flow present.  Left:  Normal size of left kidney. Normal left Resistive Index.         Normal cortical thickness of the left kidney. No evidence of         left renal artery stenosis. LRV flow present.  Mesenteric:  Normal Celiac artery and Superior Mesenteric artery findings.    Monitor 01/30/2021 3 Day Zio Monitor   Quality: Fair.  Baseline artifact. Predominant rhythm: Sinus rhythm Average heart rate: 88 bpm Max heart rate: 156 bpm Min heart rate: 55 bpm Pauses >2.5 seconds: None   13 beats NSVT <1% PACs and PVCs Patient triggered events corresponded to PVCs,  sinus rhythm, and sinus tachycardia  EKG:  EKG is ordered today.  The ekg ordered today demonstrates NSR 88 bpm with no acute ST/T wave changes.   Recent Labs: 09/25/2021: BUN 12; Creatinine, Ser 0.97; Potassium 3.9; Sodium 138 09/28/2021: Hemoglobin 12.8; Platelets 362  Recent Lipid  Panel    Component Value Date/Time   CHOL 154 01/07/2019 1654   TRIG 87 01/07/2019 1654   HDL 46 01/07/2019 1654   CHOLHDL 3.3 01/07/2019 1654   CHOLHDL 3.9 Ratio 06/12/2010 1723   VLDL 24 06/12/2010 1723   LDLCALC 92 01/07/2019 1654    Home Medications   Current Meds  Medication Sig   amLODipine (NORVASC) 5 MG tablet Take 1 tablet (5 mg total) by mouth daily.   Blood Pressure Monitoring (ADULT BLOOD PRESSURE CUFF LG) KIT Check your blood pressure once daily. Keep a written log and bring to your next doctor's appointment.   ibuprofen (ADVIL) 400 MG tablet Take 400 mg by mouth every 6 (six) hours as needed.   ibuprofen (ADVIL) 600 MG tablet Take 600 mg by mouth every 6 (six)  hours as needed.   lidocaine (LIDODERM) 5 % Place 1 patch onto the skin daily. Remove & Discard patch within 12 hours or as directed by your provider.   olmesartan (BENICAR) 40 MG tablet Take 1 tablet (40 mg total) by mouth daily. (Patient taking differently: Take 40 mg by mouth at bedtime.)   Semaglutide-Weight Management (WEGOVY) 2.4 MG/0.75ML SOAJ Inject 2.4 mg into the skin once a week.   spironolactone (ALDACTONE) 25 MG tablet Take 1 tablet (25 mg total) by mouth daily.   [DISCONTINUED] amLODipine (NORVASC) 10 MG tablet Take 1 tablet (10 mg total) by mouth daily.   [DISCONTINUED] ibuprofen (ADVIL) 600 MG tablet Take 1 tablet (600 mg total) by mouth 2 (two) times daily. (Patient taking differently: Take 600 mg by mouth every 6 (six) hours as needed.)   [DISCONTINUED] metoprolol succinate (TOPROL XL) 25 MG 24 hr tablet Take 1 tablet (25 mg total) by mouth daily.     Review of Systems      All other systems reviewed and are otherwise negative except as noted above.  Physical Exam    VS:  BP 110/70   Pulse 88   Ht '5\' 3"'$  (1.6 m)   Wt 237 lb (107.5 kg)   BMI 41.98 kg/m  , BMI Body mass index is 41.98 kg/m.  Wt Readings from Last 3 Encounters:  04/15/22 237 lb (107.5 kg)  02/28/22 237 lb 12.8 oz (107.9  kg)  12/09/21 249 lb (112.9 kg)    GEN: Well nourished, overweight, well developed, in no acute distress. HEENT: normal. Neck: Supple, no JVD, carotid bruits, or masses. Cardiac: RRR, no murmurs, rubs, or gallops. No clubbing, cyanosis, edema.  Radials/PT 2+ and equal bilaterally.  Respiratory:  Respirations regular and unlabored, clear to auscultation bilaterally. GI: Soft, nontender, nondistended. MS: No deformity or atrophy. Skin: Warm and dry, no rash. Neuro:  Strength and sensation are intact. Psych: Normal affect.  Assessment & Plan      HTN -reports BP at home well controlled. She does get hypotensive readings which are associated with fatigue. Will reduce Amlodipine to '5mg'$  QD. Continue olmesartan 40 mg daily, spironolactone 25 mg daily. Discussed to monitor BP at home at least 2 hours after medications and sitting for 5-10 minutes.  BP goal less than 130/80.BMP for monitoring on Olmesartan   Palpitations-previously prescribed Toprol 25 mg daily but has not taken due to concerns of hypotension.  We have adjusted her amlodipine, as above.  She will start Toprol 12.5 mg daily.  Discussed to avoid caffeine/alcohol, manage stress well, stay physically active, stay well-hydrated to prevent palpitations.  OSA -OSA but has not been able to afford CPAP.  We will again contact our sleep medicine team to see if there are any options.  In the meantime we will work on diet and exercise.   Morbid obesity - Weight loss via diet and exercise encouraged. Discussed the impact being overweight would have on cardiovascular risk. Over the last 5 months she has lost 18 pounds. Continue Wegovy 2.'4mg'$  weekly. Since initial start 07/2021 has lost 6% of her body weight.    HLD -presently managed with diet exercise.  She is overdue for lipid panel and will order lipid panel, CMP today.  She will have collected when she is fasting.            Disposition: Follow up in 4 month(s) with Skeet Latch, MD or  APP.  Signed, Loel Dubonnet, NP 04/15/2022, 6:15 PM [facility] 

## 2022-04-17 DIAGNOSIS — I10 Essential (primary) hypertension: Secondary | ICD-10-CM | POA: Diagnosis not present

## 2022-04-17 DIAGNOSIS — R002 Palpitations: Secondary | ICD-10-CM | POA: Diagnosis not present

## 2022-04-17 DIAGNOSIS — E782 Mixed hyperlipidemia: Secondary | ICD-10-CM | POA: Diagnosis not present

## 2022-04-18 LAB — COMPREHENSIVE METABOLIC PANEL
ALT: 16 IU/L (ref 0–32)
AST: 16 IU/L (ref 0–40)
Albumin/Globulin Ratio: 1.5 (ref 1.2–2.2)
Albumin: 4.6 g/dL (ref 3.9–4.9)
Alkaline Phosphatase: 87 IU/L (ref 44–121)
BUN/Creatinine Ratio: 14 (ref 9–23)
BUN: 15 mg/dL (ref 6–24)
Bilirubin Total: <0.2 mg/dL (ref 0.0–1.2)
CO2: 22 mmol/L (ref 20–29)
Calcium: 9.7 mg/dL (ref 8.7–10.2)
Chloride: 106 mmol/L (ref 96–106)
Creatinine, Ser: 1.08 mg/dL — ABNORMAL HIGH (ref 0.57–1.00)
Globulin, Total: 3 g/dL (ref 1.5–4.5)
Glucose: 84 mg/dL (ref 70–99)
Potassium: 4.5 mmol/L (ref 3.5–5.2)
Sodium: 140 mmol/L (ref 134–144)
Total Protein: 7.6 g/dL (ref 6.0–8.5)
eGFR: 64 mL/min/{1.73_m2} (ref >59)

## 2022-04-18 LAB — CBC
Hematocrit: 40.1 % (ref 34.0–46.6)
Hemoglobin: 13.2 g/dL (ref 11.1–15.9)
MCH: 30.3 pg (ref 26.6–33.0)
MCHC: 32.9 g/dL (ref 31.5–35.7)
MCV: 92 fL (ref 79–97)
Platelets: 379 10*3/uL (ref 150–450)
RBC: 4.36 x10E6/uL (ref 3.77–5.28)
RDW: 12.5 % (ref 11.7–15.4)
WBC: 7.6 10*3/uL (ref 3.4–10.8)

## 2022-04-18 LAB — LIPID PANEL
Chol/HDL Ratio: 3 ratio (ref 0.0–4.4)
Cholesterol, Total: 129 mg/dL (ref 100–199)
HDL: 43 mg/dL (ref >39)
LDL Chol Calc (NIH): 74 mg/dL (ref 0–99)
Triglycerides: 55 mg/dL (ref 0–149)
VLDL Cholesterol Cal: 12 mg/dL (ref 5–40)

## 2022-04-23 ENCOUNTER — Telehealth: Payer: Commercial Managed Care - PPO | Admitting: Physician Assistant

## 2022-04-23 ENCOUNTER — Telehealth: Payer: Self-pay | Admitting: *Deleted

## 2022-04-23 ENCOUNTER — Ambulatory Visit: Payer: Self-pay | Admitting: *Deleted

## 2022-04-23 ENCOUNTER — Telehealth: Payer: Self-pay | Admitting: Student

## 2022-04-23 ENCOUNTER — Other Ambulatory Visit (HOSPITAL_COMMUNITY): Payer: Self-pay

## 2022-04-23 DIAGNOSIS — B3731 Acute candidiasis of vulva and vagina: Secondary | ICD-10-CM

## 2022-04-23 MED ORDER — FLUCONAZOLE 150 MG PO TABS
150.0000 mg | ORAL_TABLET | ORAL | 0 refills | Status: DC | PRN
  Filled 2022-04-23: qty 2, 6d supply, fill #0

## 2022-04-23 NOTE — Patient Instructions (Signed)
Augustina Mood, thank you for joining Mar Daring, PA-C for today's virtual visit.  While this provider is not your primary care provider (PCP), if your PCP is located in our provider database this encounter information will be shared with them immediately following your visit.   [facility] account gives you access to today's visit and all your visits, tests, and labs performed at [facility] " click here if you don't have a [facility] account or go to [email or web address]  Consent: (Patient) Claudia Wall provided verbal consent for this virtual visit at the beginning of the encounter.  Current Medications:  Current Outpatient Medications:    fluconazole (DIFLUCAN) 150 MG tablet, Take 1 tablet (150 mg total) by mouth every 3 (three) days as needed., Disp: 2 tablet, Rfl: 0   amLODipine (NORVASC) 5 MG tablet, Take 1 tablet (5 mg total) by mouth daily., Disp: 90 tablet, Rfl: 3   Blood Pressure Monitoring (ADULT BLOOD PRESSURE CUFF LG) KIT, Check your blood pressure once daily. Keep a written log and bring to your next doctor's appointment., Disp: 1 kit, Rfl: 0   ibuprofen (ADVIL) 400 MG tablet, Take 400 mg by mouth every 6 (six) hours as needed., Disp: , Rfl:    ibuprofen (ADVIL) 600 MG tablet, Take 600 mg by mouth every 6 (six) hours as needed., Disp: , Rfl:    lidocaine (LIDODERM) 5 %, Place 1 patch onto the skin daily. Remove & Discard patch within 12 hours or as directed by your provider., Disp: 30 patch, Rfl: 0   metoprolol succinate (TOPROL XL) 25 MG 24 hr tablet, Take 0.5 tablets (12.5 mg total) by mouth daily., Disp: 15 tablet, Rfl: 2   olmesartan (BENICAR) 40 MG tablet, Take 1 tablet (40 mg total) by mouth daily. (Patient taking differently: Take 40 mg by mouth at bedtime.), Disp: 90 tablet, Rfl: 3   Semaglutide-Weight Management (WEGOVY) 2.4 MG/0.75ML SOAJ, Inject 2.4 mg into the skin once a week., Disp: 3 mL, Rfl: 1   spironolactone  (ALDACTONE) 25 MG tablet, Take 1 tablet (25 mg total) by mouth daily., Disp: 90 tablet, Rfl: 0   Medications ordered in this encounter:  Meds ordered this encounter  Medications   fluconazole (DIFLUCAN) 150 MG tablet    Sig: Take 1 tablet (150 mg total) by mouth every 3 (three) days as needed.    Dispense:  2 tablet    Refill:  0    Order Specific Question:   Supervising Provider    Answer:   Chase Picket [id number]     *If you need refills on other medications prior to your next appointment, please contact your pharmacy*  Follow-Up: Call back or seek an in-person evaluation if the symptoms worsen or if the condition fails to improve as anticipated.  [facility] [phone]  Other Instructions  Vaginal Yeast Infection, Adult  Vaginal yeast infection is a condition that causes vaginal discharge as well as soreness, swelling, and redness (inflammation) of the vagina. This is a common condition. Some women get this infection frequently. What are the causes? This condition is caused by a change in the normal balance of the yeast (Candida) and normal bacteria that live in the vagina. This change causes an overgrowth of yeast, which causes the inflammation. What increases the risk? The condition is more likely to develop in women who: Take antibiotic medicines. Have diabetes. Take birth control pills. Are pregnant. Douche often. Have a weak body  defense system (immune system). Have been taking steroid medicines for a long time. Frequently wear tight clothing. What are the signs or symptoms? Symptoms of this condition include: White, thick, creamy vaginal discharge. Swelling, itching, redness, and irritation of the vagina. The lips of the vagina (labia) may be affected as well. Pain or a burning feeling while urinating. Pain during sex. How is this diagnosed? This condition is diagnosed based on: Your medical history. A physical exam. A pelvic exam. Your  health care provider will examine a sample of your vaginal discharge under a microscope. Your health care provider may send this sample for testing to confirm the diagnosis. How is this treated? This condition is treated with medicine. Medicines may be over-the-counter or prescription. You may be told to use one or more of the following: Medicine that is taken by mouth (orally). Medicine that is applied as a cream (topically). Medicine that is inserted directly into the vagina (suppository). Follow these instructions at home: Take or apply over-the-counter and prescription medicines only as told by your health care provider. Do not use tampons until your health care provider approves. Do not have sex until your infection has cleared. Sex can prolong or worsen your symptoms of infection. Ask your health care provider when it is safe to resume sexual activity. Keep all follow-up visits. This is important. How is this prevented?  Do not wear tight clothes, such as pantyhose or tight pants. Wear breathable cotton underwear. Do not use douches, perfumed soap, creams, or powders. Wipe from front to back after using the toilet. If you have diabetes, keep your blood sugar levels under control. Ask your health care provider for other ways to prevent yeast infections. Contact a health care provider if: You have a fever. Your symptoms go away and then return. Your symptoms do not get better with treatment. Your symptoms get worse. You have new symptoms. You develop blisters in or around your vagina. You have blood coming from your vagina and it is not your menstrual period. You develop pain in your abdomen. Summary Vaginal yeast infection is a condition that causes discharge as well as soreness, swelling, and redness (inflammation) of the vagina. This condition is treated with medicine. Medicines may be over-the-counter or prescription. Take or apply over-the-counter and prescription medicines  only as told by your health care provider. Do not douche. Resume sexual activity or use of tampons as instructed by your health care provider. Contact a health care provider if your symptoms do not get better with treatment or your symptoms go away and then return. This information is not intended to replace advice given to you by your health care provider. Make sure you discuss any questions you have with your health care provider. Document Revised: 06/04/2020 Document Reviewed: 06/04/2020 Elsevier Patient Education  [place].    If you have been instructed to have an in-person evaluation today at a local Urgent Care facility, please use the link below. It will take you to a list of all of our available [facility], including address, phone number and hours of operation. Please do not delay care.  [facility]  If you or a family member do not have a primary care provider, use the link below to schedule a visit and establish care. When you choose a [facility] primary care physician or advanced practice provider, you gain a long-term partner in health. Find a Primary Care Provider  Learn more about [facility] in-office and  virtual care options: [facility]

## 2022-04-23 NOTE — Progress Notes (Signed)
Virtual Visit Consent   Claudia Wall, you are scheduled for a virtual visit with a [facility] provider today. Just as with appointments in the office, your consent must be obtained to participate. Your consent will be active for this visit and any virtual visit you may have with one of our providers in the next 365 days. If you have a MyChart account, a copy of this consent can be sent to you electronically.  As this is a virtual visit, video technology does not allow for your provider to perform a traditional examination. This may limit your provider's ability to fully assess your condition. If your provider identifies any concerns that need to be evaluated in person or the need to arrange testing (such as labs, EKG, etc.), we will make arrangements to do so. Although advances in technology are sophisticated, we cannot ensure that it will always work on either your end or our end. If the connection with a video visit is poor, the visit may have to be switched to a telephone visit. With either a video or telephone visit, we are not always able to ensure that we have a secure connection.  By engaging in this virtual visit, you consent to the provision of healthcare and authorize for your insurance to be billed (if applicable) for the services provided during this visit. Depending on your insurance coverage, you may receive a charge related to this service.  I need to obtain your verbal consent now. Are you willing to proceed with your visit today? Claudia Wall has provided verbal consent on 04/23/2022 for a virtual visit (video or telephone). Mar Daring, PA-C  Date: 04/23/2022 5:16 PM  Virtual Visit via Video Note   I, Mar Daring, connected with  Claudia Wall  ([id number], Jan 29, 1976) on 04/23/22 at  5:15 PM EST by a video-enabled telemedicine application and verified that I am speaking with the correct person using two identifiers.  Location: Patient: Virtual Visit Location  Patient: Other: Isolated at work Provider: Virtual Visit Location Provider: Home Office   I discussed the limitations of evaluation and management by telemedicine and the availability of in person appointments. The patient expressed understanding and agreed to proceed.    History of Present Illness: Claudia Wall is a 47 y.o. who identifies as a female who was assigned female at birth, and is being seen today for possible yeast infection.  HPI: Vaginal Itching The patient's primary symptoms include genital itching and vaginal discharge. The patient's pertinent negatives include no genital lesions, genital odor, pelvic pain or vaginal bleeding. Primary symptoms comment: skin irritation. This is a new problem. The current episode started in the past 7 days (2.5 days). The problem occurs constantly. The problem has been gradually worsening. The patient is experiencing no pain. Associated symptoms include constipation (from wegovy) and dysuria (mild). Pertinent negatives include no abdominal pain, back pain, chills, fever, flank pain, frequency, joint swelling, nausea or sore throat. The vaginal discharge was white and thick.     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   Anxiety 10/20/2020   Cervical dysplasia 06/09/2019   Uterine polyp 04/20/2019   Chronic bilateral low back pain without sciatica 09/23/2018   Obesity, Class III, BMI 40-49.9 (morbid obesity) ([place]) 09/23/2018   Pelvic pain 07/18/2018   Bilateral carpal tunnel syndrome  02/09/2018   Acute lumbar myofascial strain 12/29/2017   Chronic post-traumatic stress disorder (PTSD) 12/29/2017   Morbid obesity ([place]) 11/13/2017   Insomnia due to stress 11/09/2017   Chronic toe pain, left foot 11/09/2017   Other hyperlipidemia 05/13/2017   Vitamin D deficiency 05/13/2017    Prediabetes 05/13/2017   Enlarged thyroid 04/29/2017   PCOS (polycystic ovarian syndrome) 04/29/2017   Irregular periods/menstrual cycles 02/12/2017   Vaginal dryness 02/12/2017   Left carpal tunnel syndrome 07/08/2016   Carpal tunnel syndrome, right upper limb 07/07/2013   Palpitations 10/27/2011   Dyshidrotic eczema 11/06/2008   Tobacco use disorder 11/01/2008   BMI 45.0-49.9, adult ([place]) 02/03/2007   Essential hypertension 05/28/2006   GERD (gastroesophageal reflux disease) 05/28/2006    Allergies:  Allergies  Allergen Reactions   Hydrocodone     itching   Oxycodone Itching   Tylenol [Acetaminophen]     itching   Medications:  Current Outpatient Medications:    fluconazole (DIFLUCAN) 150 MG tablet, Take 1 tablet (150 mg total) by mouth every 3 (three) days as needed., Disp: 2 tablet, Rfl: 0   amLODipine (NORVASC) 5 MG tablet, Take 1 tablet (5 mg total) by mouth daily., Disp: 90 tablet, Rfl: 3   Blood Pressure Monitoring (ADULT BLOOD PRESSURE CUFF LG) KIT, Check your blood pressure once daily. Keep a written log and bring to your next doctor's appointment., Disp: 1 kit, Rfl: 0   ibuprofen (ADVIL) 400 MG tablet, Take 400 mg by mouth every 6 (six) hours as needed., Disp: , Rfl:    ibuprofen (ADVIL) 600 MG tablet, Take 600 mg by mouth every 6 (six) hours as needed., Disp: , Rfl:    lidocaine (LIDODERM) 5 %, Place 1 patch onto the skin daily. Remove & Discard patch within 12 hours or as directed by your provider., Disp: 30 patch, Rfl: 0   metoprolol succinate (TOPROL XL) 25 MG 24 hr tablet, Take 0.5 tablets (12.5 mg total) by mouth daily., Disp: 15 tablet, Rfl: 2   olmesartan (BENICAR) 40 MG tablet, Take 1 tablet (40 mg total) by mouth daily. (Patient taking differently: Take 40 mg by mouth at bedtime.), Disp: 90 tablet, Rfl: 3   Semaglutide-Weight Management (WEGOVY) 2.4 MG/0.75ML SOAJ, Inject 2.4 mg into the skin once a week., Disp: 3 mL, Rfl: 1   spironolactone (ALDACTONE) 25 MG  tablet, Take 1 tablet (25 mg total) by mouth daily., Disp: 90 tablet, Rfl: 0  Observations/Objective: Patient is well-developed, well-nourished in no acute distress.  Resting comfortably   Head is normocephalic, atraumatic.  No labored breathing.  Speech is clear and coherent with logical content.  Patient is alert and oriented at baseline.    Assessment and Plan: 1. Yeast vaginitis - fluconazole (DIFLUCAN) 150 MG tablet; Take 1 tablet (150 mg total) by mouth every 3 (three) days as needed.  Dispense: 2 tablet; Refill: 0  - Symptoms consistent with yeast vaginitis - Fluconazole prescribed - Limit bubble baths, scented lotions/soaps/detergents - Limit tight fitting clothing - Seek on person evaluation if not improving or if symptoms worsen   Follow Up Instructions: I discussed the assessment and treatment plan with the patient. The patient was provided an opportunity to ask questions and all were answered. The patient agreed with the plan and demonstrated an understanding of the instructions.  A copy of instructions were sent to the patient via MyChart unless otherwise noted below.     The patient was advised to call back or seek an in-person evaluation  if the symptoms worsen or if the condition fails to improve as anticipated.  Time:  I spent 8 minutes with the patient via telehealth technology discussing the above problems/concerns.    Mar Daring, PA-C

## 2022-04-23 NOTE — Telephone Encounter (Signed)
  Received page about after-hours call.  Attempted to call patient and left voicemail that I would call again in 10 minutes.  I called a second time, again with no answer, left voicemail for patient to call after-hours line again if she would still like to speak with a physician.      Signed      After-hours call: Patient called after-hours line again, I received notification and attempted to call patient 2 more times, left voicemail each time.  Over voicemail advised patient that she can call the after-hours line again if she still wants to speak with a physician, she can call our clinic in the morning when we are open, if she feels she needs to be seen now to go to the ED or an urgent care.          Above notes from PCP read to pt.  Secured [employer] for pt, reviewed process.

## 2022-04-23 NOTE — Telephone Encounter (Signed)
error 

## 2022-04-23 NOTE — Telephone Encounter (Signed)
Received page about after-hours call.  Attempted to call patient and left voicemail that I would call again in 10 minutes.  I called a second time, again with no answer, left voicemail for patient to call after-hours line again if she would still like to speak with a physician.

## 2022-04-23 NOTE — Telephone Encounter (Signed)
After-hours call: Patient called after-hours line again, I received notification and attempted to call patient 2 more times, left voicemail each time.  Over voicemail advised patient that she can call the after-hours line again if she still wants to speak with a physician, she can call our clinic in the morning when we are open, if she feels she needs to be seen now to go to the ED or an urgent care.

## 2022-04-29 ENCOUNTER — Encounter (HOSPITAL_BASED_OUTPATIENT_CLINIC_OR_DEPARTMENT_OTHER): Payer: Self-pay

## 2022-05-07 ENCOUNTER — Ambulatory Visit (INDEPENDENT_AMBULATORY_CARE_PROVIDER_SITE_OTHER): Payer: Commercial Managed Care - PPO | Admitting: Family Medicine

## 2022-05-07 VITALS — BP 123/81 | HR 87 | Temp 98.8°F | Wt 234.2 lb

## 2022-05-07 DIAGNOSIS — J111 Influenza due to unidentified influenza virus with other respiratory manifestations: Secondary | ICD-10-CM | POA: Diagnosis not present

## 2022-05-07 LAB — POC SOFIA 2 FLU + SARS ANTIGEN FIA
Influenza A, POC: NEGATIVE
Influenza B, POC: NEGATIVE
SARS Coronavirus 2 Ag: NEGATIVE

## 2022-05-07 NOTE — Progress Notes (Signed)
    SUBJECTIVE:   CHIEF COMPLAINT / HPI:  No chief complaint on file.   Started feeling sick yesterday. 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  Wants to make sure she can return to work.  PERTINENT  PMH / PSH: HTN, OSA, GERD, obesity, tobacco use  Patient Care Team: Alen Bleacher, MD as PCP - General (Family Medicine) Skeet Latch, MD as PCP - Cardiology (Cardiology) Skeet Latch, MD as Attending Physician (Cardiology)   OBJECTIVE:   BP 123/81   Pulse 87   Temp 98.8 F (37.1 C) (Oral)   Wt 234 lb 4 oz (106.3 kg)   LMP  (LMP Unknown) Comment: PARTIAL HYSTERECTOMY  SpO2 100%   BMI 41.50 kg/m   Physical Exam Constitutional:      General: She is not in acute distress. HENT:     Head: Normocephalic and atraumatic.     Comments: No frontal or maxillary sinus tenderness    Mouth/Throat:     Mouth: Mucous membranes are moist.     Pharynx: Oropharynx is clear. No oropharyngeal exudate or posterior oropharyngeal erythema.  Eyes:     Extraocular Movements: Extraocular movements intact.  Cardiovascular:     Rate and Rhythm: Normal rate and regular rhythm.     Heart sounds: Normal heart sounds.  Pulmonary:     Effort: Pulmonary effort is normal. No respiratory distress.     Breath sounds: Normal breath sounds.  Musculoskeletal:     Cervical back: Neck supple.  Neurological:     Mental Status: She is alert.         05/07/2022   10:57 AM  Depression screen PHQ 2/9  Decreased Interest 0  Down, Depressed, Hopeless 0  PHQ - 2 Score 0  Altered sleeping 0  Tired, decreased energy 3  Change in appetite 2  Feeling bad or failure about yourself  1  Trouble concentrating 0  Moving slowly or fidgety/restless 0  Suicidal thoughts 0  PHQ-9 Score 6  Difficult doing  work/chores Not difficult at all     {Show previous vital signs (optional):23777}    ASSESSMENT/PLAN:   1. Influenza-like illness Viral upper respiratory symptoms for 2 days, doubt bacterial etiology.  Point-of-care flu and COVID negative.  Treat supportively.  Okay to return to work tomorrow as long as she feels good enough to work. - POC SOFIA 2 FLU + SARS ANTIGEN FIA   - supportive care  Return if symptoms worsen or fail to improve.   Zola Button, MD [facility]

## 2022-05-07 NOTE — Patient Instructions (Addendum)
It was nice seeing you today!  Flu and COVID test were both negative.  There is no reason to keep you out of work as long as you feel good enough to work.  For body aches and headache, take Tylenol and/or ibuprofen as needed.  I recommend using honey for cough.  Make sure to continue staying hydrated.  Stay well, Zola Button, MD [facility] [phone]  --  Make sure to check out at the front desk before you leave today.  Please arrive at least 15 minutes prior to your scheduled appointments.  If you had blood work today, I will send you a MyChart message or a letter if results are normal. Otherwise, I will give you a call.  If you had a referral placed, they will call you to set up an appointment. Please give [name] a call if you don't hear back in the next 2 weeks.  If you need additional refills before your next appointment, please call your pharmacy first.

## 2022-05-14 ENCOUNTER — Other Ambulatory Visit (HOSPITAL_COMMUNITY): Payer: Self-pay

## 2022-05-14 ENCOUNTER — Other Ambulatory Visit: Payer: Self-pay | Admitting: Cardiovascular Disease

## 2022-05-14 DIAGNOSIS — Z6841 Body Mass Index (BMI) 40.0 and over, adult: Secondary | ICD-10-CM

## 2022-05-14 MED ORDER — WEGOVY 2.4 MG/0.75ML ~~LOC~~ SOAJ
2.4000 mg | SUBCUTANEOUS | 1 refills | Status: DC
  Filled 2022-05-14: qty 3, 28d supply, fill #0

## 2022-05-14 NOTE — Telephone Encounter (Signed)
Please review for refill. Thank you! 

## 2022-05-17 ENCOUNTER — Other Ambulatory Visit (HOSPITAL_COMMUNITY): Payer: Self-pay

## 2022-05-19 ENCOUNTER — Other Ambulatory Visit (HOSPITAL_COMMUNITY): Payer: Self-pay

## 2022-05-19 ENCOUNTER — Telehealth: Payer: Self-pay | Admitting: Cardiovascular Disease

## 2022-05-19 DIAGNOSIS — Z6841 Body Mass Index (BMI) 40.0 and over, adult: Secondary | ICD-10-CM

## 2022-05-19 NOTE — Telephone Encounter (Signed)
Pt has been on Wegovy since 06/19/21. Baseline weight 254 lbs. Current weight 234 lbs on 05/07/22. 8% weight loss. Will submit PA for continuation of therapy, Key: [id number]. We do not have any samples of Wegovy unfortunately. Pt aware.

## 2022-05-19 NOTE — Telephone Encounter (Signed)
Pt c/o medication issue:  1. Name of Medication: Semaglutide-Weight Management (WEGOVY) 2.4 MG/0.75ML SOAJ   2. How are you currently taking this medication (dosage and times per day)?  Inject 2.4 mg into the skin once a week.       3. Are you having a reaction (difficulty breathing--STAT)? No  4. What is your medication issue? Pt states she's out of medication and needing prior auth before she's able to get any. Pt would also like to know if she's able to get samples until prior auth goes through. Please advise.

## 2022-05-20 ENCOUNTER — Other Ambulatory Visit (HOSPITAL_COMMUNITY): Payer: Self-pay

## 2022-05-20 MED ORDER — WEGOVY 2.4 MG/0.75ML ~~LOC~~ SOAJ
2.4000 mg | SUBCUTANEOUS | 11 refills | Status: DC
  Filled 2022-05-20: qty 3, 28d supply, fill #0
  Filled 2022-06-10: qty 3, 28d supply, fill #1
  Filled 2022-07-08: qty 3, 28d supply, fill #2
  Filled 2022-08-27: qty 3, 28d supply, fill #3

## 2022-05-20 NOTE — Telephone Encounter (Signed)
Prior auth approved through 05/20/23, left VM for pt making her aware, refill sent to pharmacy.

## 2022-05-24 ENCOUNTER — Encounter (HOSPITAL_BASED_OUTPATIENT_CLINIC_OR_DEPARTMENT_OTHER): Payer: Self-pay

## 2022-06-10 ENCOUNTER — Other Ambulatory Visit (HOSPITAL_COMMUNITY): Payer: Self-pay

## 2022-06-15 ENCOUNTER — Encounter (HOSPITAL_BASED_OUTPATIENT_CLINIC_OR_DEPARTMENT_OTHER): Payer: Self-pay | Admitting: Emergency Medicine

## 2022-06-15 ENCOUNTER — Emergency Department (HOSPITAL_BASED_OUTPATIENT_CLINIC_OR_DEPARTMENT_OTHER): Payer: Commercial Managed Care - PPO

## 2022-06-15 ENCOUNTER — Other Ambulatory Visit: Payer: Self-pay

## 2022-06-15 ENCOUNTER — Emergency Department (HOSPITAL_BASED_OUTPATIENT_CLINIC_OR_DEPARTMENT_OTHER)
Admission: EM | Admit: 2022-06-15 | Discharge: 2022-06-15 | Disposition: A | Discharge status: Home / Self Care | Payer: Commercial Managed Care - PPO | Attending: Emergency Medicine | Admitting: Emergency Medicine

## 2022-06-15 DIAGNOSIS — X58XXXA Exposure to other specified factors, initial encounter: Secondary | ICD-10-CM | POA: Diagnosis not present

## 2022-06-15 DIAGNOSIS — Z79899 Other long term (current) drug therapy: Secondary | ICD-10-CM | POA: Diagnosis not present

## 2022-06-15 DIAGNOSIS — S93401A Sprain of unspecified ligament of right ankle, initial encounter: Secondary | ICD-10-CM | POA: Insufficient documentation

## 2022-06-15 DIAGNOSIS — S99911A Unspecified injury of right ankle, initial encounter: Secondary | ICD-10-CM | POA: Diagnosis present

## 2022-06-15 DIAGNOSIS — M25571 Pain in right ankle and joints of right foot: Secondary | ICD-10-CM | POA: Diagnosis not present

## 2022-06-15 NOTE — ED Provider Notes (Signed)
Tensed Provider Note   CSN: [id number] Arrival date & time: 06/15/22  1822     History  No chief complaint on file.   Claudia Wall is a 47 y.o. female.  HPI   Patient is a 47 year old female presents to the Emergency Department due to right ankle pain.  This has been going on off and on for months, states its "almost all the time".  She works as an Clinical biochemist, on her feet a lot for work.  There is no inciting incident that she can recall, states yesterday she went dancing and the pain became worse along her lateral ankle.  Denies paresthesias, fevers, trauma.  No previous surgeries interventions on the right foot.  Home Medications Prior to Admission medications   Medication Sig Start Date End Date Taking? Authorizing Provider  amLODipine (NORVASC) 5 MG tablet Take 1 tablet (5 mg total) by mouth daily. 04/15/22 04/10/23  Loel Dubonnet, NP  Blood Pressure Monitoring (ADULT BLOOD PRESSURE CUFF LG) KIT Check your blood pressure once daily. Keep a written log and bring to your next doctor's appointment. 08/23/20   Alcus Dad, MD  fluconazole (DIFLUCAN) 150 MG tablet Take 1 tablet (150 mg total) by mouth every 3 (three) days as needed. 04/23/22   Mar Daring, PA-C  ibuprofen (ADVIL) 400 MG tablet Take 400 mg by mouth every 6 (six) hours as needed.    [provider]  ibuprofen (ADVIL) 600 MG tablet Take 600 mg by mouth every 6 (six) hours as needed.    [provider]  lidocaine (LIDODERM) 5 % Place 1 patch onto the skin daily. Remove & Discard patch within 12 hours or as directed by your provider. 08/07/21   Aundra Dubin, PA-C  metoprolol succinate (TOPROL XL) 25 MG 24 hr tablet Take 0.5 tablets (12.5 mg total) by mouth daily. 04/15/22   Loel Dubonnet, NP  olmesartan (BENICAR) 40 MG tablet Take 1 tablet (40 mg total) by mouth daily. Patient taking differently: Take 40 mg by mouth at bedtime. 05/24/21    Skeet Latch, MD  Semaglutide-Weight Management [facility]) 2.4 MG/0.75ML SOAJ Inject 2.4 mg into the skin once a week. 05/20/22   Skeet Latch, MD  spironolactone (ALDACTONE) 25 MG tablet Take 1 tablet (25 mg total) by mouth daily. 03/14/22   Skeet Latch, MD      Allergies    Hydrocodone, Oxycodone, and Tylenol [acetaminophen]    Review of Systems   Review of Systems  Physical Exam Updated Vital Signs BP 138/86   Pulse 86   Temp 98.2 F (36.8 C) (Oral)   Resp 17   LMP  (LMP Unknown) Comment: PARTIAL HYSTERECTOMY  SpO2 98%  Physical Exam Vitals and nursing note reviewed. Exam conducted with a chaperone present.  Constitutional:      General: She is not in acute distress.    Appearance: Normal appearance.  HENT:     Head: Normocephalic and atraumatic.  Eyes:     General: No scleral icterus.    Extraocular Movements: Extraocular movements intact.     Pupils: Pupils are equal, round, and reactive to light.  Cardiovascular:     Pulses: Normal pulses.  Musculoskeletal:        General: Tenderness present. Normal range of motion.     Comments: Tenderness over lateral malleolus, able to plantarflex and dorsiflex, can move all toes.  No crepitus, no contusion.  Negative Thompson sign.  Skin:  Capillary Refill: Capillary refill takes less than 2 seconds.     Coloration: Skin is not jaundiced.  Neurological:     Mental Status: She is alert. Mental status is at baseline.     Coordination: Coordination normal.     ED Results / Procedures / Treatments   Labs (all labs ordered are listed, but only abnormal results are displayed) Labs Reviewed - No data to display  EKG None  Radiology DG Ankle Complete Right  Result Date: 06/15/2022 CLINICAL DATA:  Right ankle pain EXAM: RIGHT ANKLE - COMPLETE 3+ VIEW COMPARISON:  None Available. FINDINGS: There is no evidence of fracture, dislocation, or joint effusion. There is no evidence of arthropathy or other focal bone  abnormality. Soft tissues are unremarkable. IMPRESSION: Negative. Electronically Signed   By: Fidela Salisbury M.D.   On: 06/15/2022 19:21    Procedures Procedures    Medications Ordered in ED Medications - No data to display  ED Course/ Medical Decision Making/ A&P                             Medical Decision Making Amount and/or Complexity of Data Reviewed Radiology: ordered.   Patient is a 47 year old female presenting to the emergency department due to right foot/ankle pain.  Differential includes fracture, dislocation, sprain, tendon or ligamental injury, compartment syndrome.  Neurovascular intact on exam, brisk cap refill.  DP PT 2+, compartments are soft.  Able to range, no indication of ischemic limb, septic joint, compartment syndrome, obvious deformity.  X-ray negative.  Suspect strain, recommended podiatry follow-up.  Stable for discharge.        Final Clinical Impression(s) / ED Diagnoses Final diagnoses:  Sprain of right ankle, unspecified ligament, initial encounter    Rx / DC Orders ED Discharge Orders     None         Sherrill Raring, PA-C 06/15/22 [place], [place], DO 06/15/22 2315

## 2022-06-15 NOTE — ED Notes (Signed)
RN reviewed discharge instructions with pt. Pt verbalized understanding and had no further questions. VSS upon discharge.  

## 2022-06-15 NOTE — Discharge Instructions (Signed)
Your x-ray today was fine, no broken bone.  Suspect you have a sprain, follow-up with podiatry if no improvement.  He can take Tylenol Motrin for pain.

## 2022-06-15 NOTE — ED Triage Notes (Signed)
Right bottom foot and ankle has been bothering her for about a month, went dancing last night and is really hurting now.

## 2022-06-15 NOTE — ED Notes (Signed)
X-ray at bedside

## 2022-06-16 DIAGNOSIS — M25571 Pain in right ankle and joints of right foot: Secondary | ICD-10-CM | POA: Diagnosis not present

## 2022-06-17 ENCOUNTER — Ambulatory Visit: Payer: Commercial Managed Care - PPO | Admitting: Student

## 2022-06-17 ENCOUNTER — Encounter: Payer: Self-pay | Admitting: Student

## 2022-06-17 ENCOUNTER — Other Ambulatory Visit (HOSPITAL_COMMUNITY): Payer: Self-pay

## 2022-06-17 VITALS — BP 134/87 | HR 80 | Ht 63.0 in | Wt 238.8 lb

## 2022-06-17 DIAGNOSIS — N898 Other specified noninflammatory disorders of vagina: Secondary | ICD-10-CM

## 2022-06-17 DIAGNOSIS — R413 Other amnesia: Secondary | ICD-10-CM

## 2022-06-17 DIAGNOSIS — Z0000 Encounter for general adult medical examination without abnormal findings: Secondary | ICD-10-CM

## 2022-06-17 DIAGNOSIS — F419 Anxiety disorder, unspecified: Secondary | ICD-10-CM | POA: Diagnosis not present

## 2022-06-17 LAB — POCT WET PREP (WET MOUNT)
Clue Cells Wet Prep Whiff POC: NEGATIVE
Trichomonas Wet Prep HPF POC: ABSENT

## 2022-06-17 LAB — POCT GLYCOSYLATED HEMOGLOBIN (HGB A1C): Hemoglobin A1C: 5.2 % (ref 4.0–5.6)

## 2022-06-17 MED ORDER — HYDROXYZINE HCL 25 MG PO TABS
25.0000 mg | ORAL_TABLET | Freq: Four times a day (QID) | ORAL | 0 refills | Status: DC | PRN
  Filled 2022-06-17: qty 10, 3d supply, fill #0

## 2022-06-17 MED ORDER — HYDROXYZINE HCL 10 MG PO TABS
10.0000 mg | ORAL_TABLET | Freq: Four times a day (QID) | ORAL | 0 refills | Status: DC | PRN
  Filled 2022-06-17: qty 10, 3d supply, fill #0
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
## 2022-06-17 NOTE — Patient Instructions (Addendum)
It was wonderful to see you today. Thank you for allowing me to be a part of your care. Below is a short summary of what we discussed at your visit today:  For your memory I am suspicious that this is likely stress-induced.  Strongly believe you will benefit from counseling.  You can call your insurance to give you a list of therapist in your network.  I will also get your TSH level to check your thyroid level as well.  I have sent in prescription for hydroxyzine which you will take before your flight.  This is to help with your anxiety and also help with sleeping as well.  For your vaginal discharge we have taken a sample to swab you for STD and fungi infection.  Please bring all of your medications to every appointment!  If you have any questions or concerns, please do not hesitate to contact us via phone or MyChart message.   Alen Bleacher, MD [facility]

## 2022-06-18 ENCOUNTER — Telehealth: Payer: Self-pay | Admitting: Student

## 2022-06-18 LAB — TSH: TSH: 1.19 u[IU]/mL (ref 0.450–4.500)

## 2022-06-18 MED ORDER — CLOTRIMAZOLE 1 % EX CREA
1.0000 | TOPICAL_CREAM | Freq: Two times a day (BID) | CUTANEOUS | 0 refills | Status: DC
  Filled 2022-06-18: qty 30, 15d supply, fill #0

## 2022-06-18 NOTE — Telephone Encounter (Signed)
Called and verified patient before discussing results of her wet prep which was positive for fungi infection. Patient expressed preference for topical treatment since she got a two dose diflucan treatment about a month ago in which the pruritus improved but discharge has since not resolved. Sent in prescription for topical Clotrimazole.

## 2022-06-19 ENCOUNTER — Other Ambulatory Visit (HOSPITAL_COMMUNITY): Payer: Self-pay

## 2022-06-19 ENCOUNTER — Encounter: Payer: Self-pay | Admitting: Student

## 2022-06-20 ENCOUNTER — Other Ambulatory Visit: Payer: Self-pay | Admitting: Student

## 2022-06-20 ENCOUNTER — Other Ambulatory Visit (HOSPITAL_COMMUNITY): Payer: Self-pay

## 2022-06-20 ENCOUNTER — Encounter: Payer: Self-pay | Admitting: Student

## 2022-06-20 MED ORDER — CLOTRIMAZOLE 2 % VA CREA
1.0000 | TOPICAL_CREAM | Freq: Every day | VAGINAL | 0 refills | Status: AC
Start: 2022-06-20 — End: 2022-06-23
  Filled 2022-06-20: qty 21, 3d supply, fill #0

## 2022-06-20 NOTE — Progress Notes (Signed)
Ordered vaginal Clotrimazole for 3 days.

## 2022-06-20 NOTE — Telephone Encounter (Signed)
Patient calls nurse line in regards to vaginal yeast medication.   She reports she went to the pharmacy and she was given an external cream. She reports her yeast symptoms are "internal." She did not pick medication up.   She does not want Diflucan and prefers an internal vaginal cream.  Will forward to PCP.

## 2022-06-21 ENCOUNTER — Other Ambulatory Visit (HOSPITAL_COMMUNITY): Payer: Self-pay

## 2022-06-23 ENCOUNTER — Other Ambulatory Visit (HOSPITAL_COMMUNITY): Payer: Self-pay

## 2022-06-26 ENCOUNTER — Other Ambulatory Visit (HOSPITAL_COMMUNITY): Payer: Self-pay

## 2022-06-26 ENCOUNTER — Telehealth: Payer: Commercial Managed Care - PPO | Admitting: Family Medicine

## 2022-06-26 ENCOUNTER — Other Ambulatory Visit: Payer: Self-pay | Admitting: Cardiovascular Disease

## 2022-06-26 ENCOUNTER — Other Ambulatory Visit (HOSPITAL_BASED_OUTPATIENT_CLINIC_OR_DEPARTMENT_OTHER): Payer: Self-pay | Admitting: Cardiovascular Disease

## 2022-06-26 DIAGNOSIS — B3731 Acute candidiasis of vulva and vagina: Secondary | ICD-10-CM | POA: Diagnosis not present

## 2022-06-26 MED ORDER — FLUCONAZOLE 150 MG PO TABS
150.0000 mg | ORAL_TABLET | ORAL | 0 refills | Status: DC
  Filled 2022-06-26: qty 2, 3d supply, fill #0

## 2022-06-26 NOTE — Progress Notes (Signed)

## 2022-06-27 ENCOUNTER — Other Ambulatory Visit (HOSPITAL_COMMUNITY): Payer: Self-pay

## 2022-06-27 MED ORDER — OLMESARTAN MEDOXOMIL 40 MG PO TABS
40.0000 mg | ORAL_TABLET | Freq: Every day | ORAL | 3 refills | Status: DC
  Filled 2022-06-27: qty 90, 90d supply, fill #0
  Filled 2022-09-18: qty 90, 90d supply, fill #1
  Filled 2023-01-15: qty 90, 90d supply, fill #2
  Filled 2023-04-20: qty 90, 90d supply, fill #3

## 2022-06-27 MED ORDER — SPIRONOLACTONE 25 MG PO TABS
25.0000 mg | ORAL_TABLET | Freq: Every day | ORAL | 0 refills | Status: DC
  Filled 2022-06-27: qty 90, 90d supply, fill #0

## 2022-07-04 ENCOUNTER — Other Ambulatory Visit (HOSPITAL_COMMUNITY): Payer: Self-pay

## 2022-07-07 ENCOUNTER — Other Ambulatory Visit: Payer: Self-pay | Admitting: Student

## 2022-07-07 ENCOUNTER — Other Ambulatory Visit (HOSPITAL_COMMUNITY): Payer: Self-pay

## 2022-07-07 ENCOUNTER — Telehealth: Payer: Self-pay

## 2022-07-07 DIAGNOSIS — F419 Anxiety disorder, unspecified: Secondary | ICD-10-CM

## 2022-07-07 MED ORDER — LORAZEPAM 0.5 MG PO TABS
0.5000 mg | ORAL_TABLET | Freq: Three times a day (TID) | ORAL | 0 refills | Status: AC
  Filled 2022-07-07: qty 6, 2d supply, fill #0

## 2022-07-07 NOTE — Progress Notes (Signed)
Ordered ativan 0.5mg  Q8H for flight anxiety. Previously gave PRN hydroxyzine but patient report that wasn't as effective as ativan in the past. Unable to reach patient but left a generic VM to say med hafve been sent to her requested pharmacy.

## 2022-07-07 NOTE — Telephone Encounter (Signed)
Patient calls nurse line due to concerns with flight anxiety. She has flight on Wednesday, 4/10 and feels that hydroxyzine is not going to be strong enough for her level of anxiety.   She states that the ativan worked well in the past. "Kept calm enough to get on the plane without panicking."   Advised that she may need additional appointment to discuss medication change.   Patient states that she was just seen a few weeks ago.  Will forward request to PCP.   Pharmacy- [facility]  Veronda Prude, RN

## 2022-07-08 ENCOUNTER — Other Ambulatory Visit (HOSPITAL_COMMUNITY): Payer: Self-pay

## 2022-07-09 ENCOUNTER — Other Ambulatory Visit (HOSPITAL_COMMUNITY): Payer: Self-pay

## 2022-07-10 ENCOUNTER — Other Ambulatory Visit (HOSPITAL_COMMUNITY): Payer: Self-pay

## 2022-07-10 ENCOUNTER — Other Ambulatory Visit: Payer: Self-pay

## 2022-07-23 ENCOUNTER — Other Ambulatory Visit (HOSPITAL_COMMUNITY): Payer: Self-pay

## 2022-07-23 ENCOUNTER — Other Ambulatory Visit: Payer: Self-pay | Admitting: Internal Medicine

## 2022-07-23 MED ORDER — POLYMYXIN B-TRIMETHOPRIM 10000-0.1 UNIT/ML-% OP SOLN
1.0000 [drp] | OPHTHALMIC | 0 refills | Status: AC
  Filled 2022-07-23: qty 10, 25d supply, fill #0

## 2022-07-23 NOTE — Progress Notes (Signed)
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 States feels like gravel in her eyes. Denies allergies. Sending in drops for acute bacterial conjunctivitis. Discussed possibly viral. Hand washing/avoid eye touching discussed.

## 2022-07-30 ENCOUNTER — Other Ambulatory Visit (HOSPITAL_COMMUNITY): Payer: Self-pay

## 2022-08-14 ENCOUNTER — Ambulatory Visit (HOSPITAL_BASED_OUTPATIENT_CLINIC_OR_DEPARTMENT_OTHER): Payer: Commercial Managed Care - PPO | Admitting: Family

## 2022-08-27 ENCOUNTER — Other Ambulatory Visit (HOSPITAL_COMMUNITY): Payer: Self-pay

## 2022-08-27 ENCOUNTER — Telehealth: Payer: Self-pay | Admitting: Cardiovascular Disease

## 2022-08-27 NOTE — Telephone Encounter (Signed)
Chart reviewed, patient is [employer] employee- insurance coverage for weight loss medications has ended.   Returned call to patient, no answer, left message to call back

## 2022-08-27 NOTE — Telephone Encounter (Signed)
Pt c/o medication issue:  1. Name of Medication: Semaglutide-Weight Management (WEGOVY) 2.4 MG/0.75ML SOAJ   2. How are you currently taking this medication (dosage and times per day)? Inject 2.4 mg into the skin once a week.   3. Are you having a reaction (difficulty breathing--STAT)? No  4. What is your medication issue? Pt states that insurance will not cover medication. Pt would like a callback regarding this matter. Please advise

## 2022-08-28 NOTE — Telephone Encounter (Signed)
Patient returned call, transferred from call center;   Rescheduled patient for her overdue 4 month follow up. Explained to patient that [employer] dropped their coverage for weight loss medications. Explained we can review chart to see if we can get it covered under new heart indications. Patient verbalizes understanding.

## 2022-08-28 NOTE — Telephone Encounter (Signed)
Medicare is the only one covering for now under cardiovascular risk reduction. We can refer to [facility] or [facility] to help with weight loss if she is interested.   Alver Sorrow, NP

## 2022-09-04 ENCOUNTER — Other Ambulatory Visit (HOSPITAL_COMMUNITY): Payer: Self-pay

## 2022-09-05 DIAGNOSIS — Z1231 Encounter for screening mammogram for malignant neoplasm of breast: Secondary | ICD-10-CM | POA: Diagnosis not present

## 2022-09-05 DIAGNOSIS — G8929 Other chronic pain: Secondary | ICD-10-CM | POA: Diagnosis not present

## 2022-09-05 DIAGNOSIS — R92323 Mammographic fibroglandular density, bilateral breasts: Secondary | ICD-10-CM | POA: Diagnosis not present

## 2022-09-05 DIAGNOSIS — I10 Essential (primary) hypertension: Secondary | ICD-10-CM | POA: Diagnosis not present

## 2022-09-05 DIAGNOSIS — Z6841 Body Mass Index (BMI) 40.0 and over, adult: Secondary | ICD-10-CM | POA: Diagnosis not present

## 2022-09-05 DIAGNOSIS — F40243 Fear of flying: Secondary | ICD-10-CM | POA: Diagnosis not present

## 2022-09-05 DIAGNOSIS — M545 Low back pain, unspecified: Secondary | ICD-10-CM | POA: Diagnosis not present

## 2022-09-05 DIAGNOSIS — Z1331 Encounter for screening for depression: Secondary | ICD-10-CM | POA: Diagnosis not present

## 2022-09-18 ENCOUNTER — Other Ambulatory Visit: Payer: Self-pay | Admitting: Family

## 2022-09-18 ENCOUNTER — Other Ambulatory Visit: Payer: Self-pay

## 2022-09-18 ENCOUNTER — Other Ambulatory Visit (HOSPITAL_COMMUNITY): Payer: Self-pay

## 2022-09-18 MED ORDER — SPIRONOLACTONE 25 MG PO TABS
25.0000 mg | ORAL_TABLET | Freq: Every day | ORAL | 1 refills | Status: DC
  Filled 2022-09-18: qty 90, 90d supply, fill #0

## 2022-09-18 NOTE — Telephone Encounter (Signed)
Rx request sent to pharmacy.  

## 2022-10-01 ENCOUNTER — Other Ambulatory Visit (HOSPITAL_COMMUNITY): Payer: Self-pay

## 2022-10-13 ENCOUNTER — Telehealth (HOSPITAL_BASED_OUTPATIENT_CLINIC_OR_DEPARTMENT_OTHER): Payer: Commercial Managed Care - PPO | Admitting: Family

## 2022-10-13 NOTE — Progress Notes (Deleted)
  Cardiology Office Note:  .   Date:  10/13/2022  ID:  Claudia Wall, DOB Mar 01, 1976, MRN [id number] PCP: Claudia Simon, MD  [facility] Providers Cardiologist:  Claudia Si, MD { Click to update primary MD,subspecialty MD or APP then REFRESH:1}   History of Present Illness: .   Claudia Wall is a 47 y.o. female *** with a hx of  HTN, morbid obesity, GERD, OSA, PCOS, alcohol abuse, anxiety, depression, PTSD. Last seen via video visit 12/09/21.    Initial evaluation October 2022 for elevated blood pressure and palpitations.  Initial palpitations August 2019 or 2020 right after receiving COVID vaccination.  Described as dizziness, shortness of breath, "spaced out ".  She reported being diagnosed with hypertension when she was 47 years old.  Initially prescribed doxazosin in her early 74s but did not take this.  Prior sleep study did not reveal sleep apnea.  Renal and aldosterone and thyroid were all normal.  3-day ZIO monitor November 2022 NSR, rare PAC/PVC, 13 beat NSVT.  Echo revealed LVEF 60-65%, grade 1 diastolic dysfunction.  Renal artery Doppler is normal.  Repeat sleep study 02/2021 revealed obstructive sleep apnea and was recommended for CPAP.  Palpitations improved with reduced alcohol intake.  Started on Wegovy for weight loss.   Seen 11/01/2021 noting persistent palpitations and was started on Toprol 25 mg daily.  BP was at goal on amlodipine, olmesartan, spironolactone.  She was unable to afford CPAP and was referred to sleep medicine team to see if there are any additional options.  She was restarted on Wegovy.   Seen 12/09/21 via video visit. She had not yet started Metoprolol due to concerns regarding the medication and was recommended to start.    Urgent care visit 02/25/22 with chest pain worse with inspiration. EKG nonacute. She declined to be seen in ED. CXR unremarkable.    Follow-up today for follow up independently.  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  She notes she has not started taking metoprolol as she gets blood pressure readings at home 90s/50s-110s/60s and was concerned about hypotension.  When her blood pressure is low she notes associated fatigue.Reports no shortness of breath nor dyspnea on exertion. Reports no chest pain, pressure, or tightness. No edema, orthopnea, PND. Reports continued palpitations.    ROS: ***  Studies Reviewed: .        *** Risk Assessment/Calculations:   {Does this patient have ATRIAL FIBRILLATION?:[id number]} No BP recorded.  {Refresh Note OR Click here to enter BP  :1}***       Physical Exam:   VS:  LMP  (LMP Unknown) Comment: PARTIAL HYSTERECTOMY   Wt Readings from Last 3 Encounters:  06/17/22 238 lb 12.8 oz (108.3 kg)  05/07/22 234 lb 4 oz (106.3 kg)  04/15/22 237 lb (107.5 kg)    GEN: Well nourished, well developed in no acute distress NECK: No JVD; No carotid bruits CARDIAC: ***RRR, no murmurs, rubs, gallops RESPIRATORY:  Clear to auscultation without rales, wheezing or rhonchi  ABDOMEN: Soft, non-tender, non-distended EXTREMITIES:  No edema; No deformity   ASSESSMENT AND PLAN: .   ***    {Are you ordering a CV Procedure (e.g. stress test, cath, DCCV, TEE, etc)?   Press F2        :[id number]}  Dispo: ***  Signed, Alver Sorrow, NP

## 2022-10-17 ENCOUNTER — Other Ambulatory Visit (HOSPITAL_COMMUNITY): Payer: Self-pay

## 2022-10-17 DIAGNOSIS — M47816 Spondylosis without myelopathy or radiculopathy, lumbar region: Secondary | ICD-10-CM | POA: Diagnosis not present

## 2022-10-17 DIAGNOSIS — M533 Sacrococcygeal disorders, not elsewhere classified: Secondary | ICD-10-CM | POA: Diagnosis not present

## 2022-10-17 MED ORDER — BACLOFEN 10 MG PO TABS
ORAL_TABLET | ORAL | 0 refills | Status: DC
  Filled 2022-10-17: qty 90, 30d supply, fill #0

## 2022-10-18 ENCOUNTER — Other Ambulatory Visit (HOSPITAL_COMMUNITY): Payer: Self-pay

## 2022-11-03 ENCOUNTER — Other Ambulatory Visit (HOSPITAL_BASED_OUTPATIENT_CLINIC_OR_DEPARTMENT_OTHER): Payer: Self-pay | Admitting: Family

## 2022-11-03 ENCOUNTER — Other Ambulatory Visit (HOSPITAL_COMMUNITY): Payer: Self-pay

## 2022-11-03 DIAGNOSIS — I10 Essential (primary) hypertension: Secondary | ICD-10-CM

## 2022-11-05 ENCOUNTER — Other Ambulatory Visit (HOSPITAL_COMMUNITY): Payer: Self-pay

## 2022-11-06 ENCOUNTER — Other Ambulatory Visit (HOSPITAL_COMMUNITY): Payer: Self-pay

## 2022-11-06 ENCOUNTER — Telehealth (HOSPITAL_BASED_OUTPATIENT_CLINIC_OR_DEPARTMENT_OTHER): Payer: Self-pay | Admitting: Cardiovascular Disease

## 2022-11-06 DIAGNOSIS — I10 Essential (primary) hypertension: Secondary | ICD-10-CM

## 2022-11-06 MED ORDER — METOPROLOL SUCCINATE ER 25 MG PO TB24
25.0000 mg | ORAL_TABLET | Freq: Every day | ORAL | 2 refills | Status: DC
Start: 2022-11-06 — End: 2022-11-28
  Filled 2022-11-06: qty 30, 30d supply, fill #0

## 2022-11-06 NOTE — Telephone Encounter (Signed)
Discussed with Ronn Melena NP and ok to fill at full tablet daily   Mychart message sent as requested

## 2022-11-06 NOTE — Telephone Encounter (Signed)
Pt c/o medication issue:  1. Name of Medication:  metoprolol succinate (TOPROL XL) 25 MG 24 hr tablet   2. How are you currently taking this medication (dosage and times per day)? 1 tablet daily   3. Are you having a reaction (difficulty breathing--STAT)? No   4. What is your medication issue? Patient states she has been taking a tablet daily and ran out of meds. She went to the pharmacy to pick up a refill and they advised she is only supposed to take 1/2 tablet daily so she is not due for a refill. Patient reports a full tablet has been working for her so she is requesting the prescription be changed and be sent in to last her until her upcoming appointment. She states you can attempt to call, but she may be unable to answer due to work. If so a mychart message will suffice. Please advise.

## 2022-11-13 DIAGNOSIS — M48062 Spinal stenosis, lumbar region with neurogenic claudication: Secondary | ICD-10-CM | POA: Diagnosis not present

## 2022-11-13 DIAGNOSIS — M47816 Spondylosis without myelopathy or radiculopathy, lumbar region: Secondary | ICD-10-CM | POA: Diagnosis not present

## 2022-11-20 DIAGNOSIS — M47816 Spondylosis without myelopathy or radiculopathy, lumbar region: Secondary | ICD-10-CM | POA: Diagnosis not present

## 2022-11-20 DIAGNOSIS — M48062 Spinal stenosis, lumbar region with neurogenic claudication: Secondary | ICD-10-CM | POA: Diagnosis not present

## 2022-11-28 ENCOUNTER — Other Ambulatory Visit (HOSPITAL_COMMUNITY): Payer: Self-pay

## 2022-11-28 ENCOUNTER — Ambulatory Visit (HOSPITAL_BASED_OUTPATIENT_CLINIC_OR_DEPARTMENT_OTHER): Payer: Commercial Managed Care - PPO | Admitting: Family

## 2022-11-28 ENCOUNTER — Encounter (HOSPITAL_BASED_OUTPATIENT_CLINIC_OR_DEPARTMENT_OTHER): Payer: Self-pay | Admitting: Family

## 2022-11-28 VITALS — BP 114/74 | HR 80 | Ht 63.0 in | Wt 244.0 lb

## 2022-11-28 DIAGNOSIS — R600 Localized edema: Secondary | ICD-10-CM | POA: Diagnosis not present

## 2022-11-28 DIAGNOSIS — I10 Essential (primary) hypertension: Secondary | ICD-10-CM

## 2022-11-28 DIAGNOSIS — R7303 Prediabetes: Secondary | ICD-10-CM

## 2022-11-28 DIAGNOSIS — R002 Palpitations: Secondary | ICD-10-CM

## 2022-11-28 MED ORDER — SPIRONOLACTONE 25 MG PO TABS
25.0000 mg | ORAL_TABLET | Freq: Every day | ORAL | 3 refills | Status: AC
Start: 2022-11-28 — End: ?
  Filled 2022-11-28 – 2023-01-15 (×2): qty 90, 90d supply, fill #0
  Filled 2023-05-05: qty 90, 90d supply, fill #1
  Filled 2023-08-13: qty 90, 90d supply, fill #2

## 2022-11-28 MED ORDER — METOPROLOL SUCCINATE ER 25 MG PO TB24
25.0000 mg | ORAL_TABLET | Freq: Every day | ORAL | 1 refills | Status: DC
Start: 2022-11-28 — End: 2023-05-07
  Filled 2022-11-28 – 2022-12-09 (×2): qty 90, 90d supply, fill #0
  Filled 2023-03-09: qty 90, 90d supply, fill #1

## 2022-11-28 NOTE — Patient Instructions (Signed)
Medication Instructions:  Continue your current medications.   *If you need a refill on your cardiac medications before your next appointment, please call your pharmacy*   Lab Work: Your physician recommends that you return for lab work today: BNP, A1c  If you have labs (blood work) drawn today and your tests are completely normal, you will receive your results only by: MyChart Message (if you have MyChart) OR A paper copy in the mail If you have any lab test that is abnormal or we need to change your treatment, we will call you to review the results.  Follow-Up: At [facility], you and your health needs are our priority.  As part of our continuing mission to provide you with exceptional heart care, we have created designated Provider Care Teams.  These Care Teams include your primary Cardiologist (physician) and Advanced Practice Providers (APPs -  Physician Assistants and Nurse Practitioners) who all work together to provide you with the care you need, when you need it.  We recommend signing up for the patient portal called "MyChart".  Sign up information is provided on this After Visit Summary.  MyChart is used to connect with patients for Virtual Visits (Telemedicine).  Patients are able to view lab/test results, encounter notes, upcoming appointments, etc.  Non-urgent messages can be sent to your provider as well.   To learn more about what you can do with MyChart, go to [email or web address].    Your next appointment:   6 month(s)  Provider:   Chilton Si, MD or Gillian Shields, NP    Other Instructions  To prevent or reduce lower extremity swelling: Eat a low salt diet. Salt makes the body hold onto extra fluid which causes swelling. Sit with legs elevated. For example, in the recliner or on an ottoman.  Wear knee-high compression stockings during the daytime. Ones labeled 15-20 mmHg provide good compression.  Chronic Venous Insufficiency Chronic venous  insufficiency is a condition that causes the veins in the legs to struggle to pump blood from the legs to the heart. It is also called venous stasis. This condition can happen when the vein walls are stretched, weakened, or damaged. It can also happen when the valves inside the vein are damaged. With the right treatment, you should be able to still lead an active life. What are the causes? Common causes of this condition include: Venous hypertension. This is high blood pressure inside the veins. Sitting or standing too long. This can cause increased blood pressure in the veins of the leg. Deep vein thrombosis (DVT). This is a blood clot that blocks blood flow in a vein. Phlebitis. This is inflammation of a vein. It can cause a blood clot to form. An abnormal growth of cells (tumor) in the area between your hip bones (pelvis). This can cause blood to back up. What increases the risk? Factors that may make you more likely to get this condition include: Having a family history of the condition. Being overweight. Being pregnant. Not getting enough exercise. Smoking. Having a job that requires you to sit or stand in one place for a long time. Being a certain age. Females in their 33s and 81s and males in their 71s are more likely to get this condition. What are the signs or symptoms? Symptoms of this condition include: Varicose veins. These are veins that are enlarged, bulging, or twisted. Skin breakdown or ulcers. Reddened skin or dark discoloration of the skin on the leg between the knee and ankle.  Lipodermatosclerosis. This is brown, smooth, tight, and painful skin just above the ankle. It is often on the inside of the leg. Swelling of the legs. How is this diagnosed? This condition may be diagnosed based on your medical history and a physical exam. You may also need tests, such as: A duplex ultrasound. This shows how blood flows through a blood vessel. Plethysmography. This tests blood  flow. Venogram. This looks at the veins using an X-ray and dye. How is this treated? The goals of treatment are to help you return to an active life and to relieve pain. Treatment may include: Wearing compression stockings. These do not cure the condition but can help relieve symptoms. They can also help stop your condition from getting worse. Sclerotherapy. This involves injecting a solution to shrink damaged veins. Surgery. This may include: Vein stripping. This is when a diseased vein is taken out. Laser ablation surgery. This is when blood flow is cut off through the vein. Repairing or remaking a valve inside the affected vein. Follow these instructions at home: Lifestyle Do not use any products that contain nicotine or tobacco. These products include cigarettes, chewing tobacco, and vaping devices, such as e-cigarettes. If you need help quitting, ask your health care provider. Stay active. Exercise, walk, or do other activities. Ask your provider what activities are safe for you. General instructions Take over-the-counter and prescription medicines only as told by your provider. Drink enough fluid to keep your pee (urine) pale yellow. Wear compression stockings as told by your provider. These stockings help to prevent blood clots and reduce swelling in your legs. Keep all follow-up visits. Your provider will check your legs for any changes and adjust your treatment plan as needed. Contact a health care provider if: You have redness, swelling, or more pain in the affected area. You see a red streak or line that goes up or down from the area. You have skin breakdown or skin loss. You get an injury in the affected area. You get a fever. Get help right away if: You have severe pain that does not get better with medicine. You get an injury and an open wound in the affected area. Your foot or ankle becomes numb or weak all of a sudden. You have trouble moving your foot or ankle. Your  symptoms do not go away or get worse. You have chest pain. You have shortness of breath. These symptoms may be an emergency. Get help right away. Call 911. Do not wait to see if the symptoms will go away. Do not drive yourself to the hospital. This information is not intended to replace advice given to you by your health care provider. Make sure you discuss any questions you have with your health care provider. Document Revised: 04/01/2022 Document Reviewed: 04/01/2022 Elsevier Patient Education  2024 [employer].

## 2022-11-28 NOTE — Progress Notes (Unsigned)
  Cardiology Office Note:  .   Date:  11/28/2022  ID:  Claudia Wall, DOB 21-Aug-1975, MRN [id number] PCP: Jerre Simon, MD  [facility] Providers Cardiologist:  Chilton Si, MD { Click to update primary MD,subspecialty MD or APP then REFRESH:1}   History of Present Illness: .   Claudia Wall is a 47 y.o. female ***  Bilateral legs started swelling a week ago. Ntoes she increased her fluid intake about a week ago.   Since starting Metoprolol, palpitations re better. Notes they are now only intermittently occurring.   No lightheadedness, dizziness.   ROS: ***  Studies Reviewed: .        *** Risk Assessment/Calculations:   {Does this patient have ATRIAL FIBRILLATION?:[id number]}         Physical Exam:   VS:  BP 114/74   Pulse 80   Ht 5\' 3"  (1.6 m)   Wt 244 lb (110.7 kg)   LMP  (LMP Unknown) Comment: PARTIAL HYSTERECTOMY  SpO2 100%   BMI 43.22 kg/m    Wt Readings from Last 3 Encounters:  11/28/22 244 lb (110.7 kg)  06/17/22 238 lb 12.8 oz (108.3 kg)  05/07/22 234 lb 4 oz (106.3 kg)    GEN: Well nourished, well developed in no acute distress NECK: No JVD; No carotid bruits CARDIAC: ***RRR, no murmurs, rubs, gallops RESPIRATORY:  Clear to auscultation without rales, wheezing or rhonchi  ABDOMEN: Soft, non-tender, non-distended EXTREMITIES:  No edema; No deformity   ASSESSMENT AND PLAN: .   ***    {Are you ordering a CV Procedure (e.g. stress test, cath, DCCV, TEE, etc)?   Press F2        :[id number]}  Dispo: ***  Signed, Alver Sorrow, NP

## 2022-11-29 LAB — BRAIN NATRIURETIC PEPTIDE: BNP: 6.3 pg/mL (ref 0.0–100.0)

## 2022-11-29 LAB — HEMOGLOBIN A1C
Est. average glucose Bld gHb Est-mCnc: 120 mg/dL
Hgb A1c MFr Bld: 5.8 % — ABNORMAL HIGH (ref 4.8–5.6)

## 2022-12-02 ENCOUNTER — Other Ambulatory Visit (HOSPITAL_COMMUNITY): Payer: Self-pay

## 2022-12-04 DIAGNOSIS — M47816 Spondylosis without myelopathy or radiculopathy, lumbar region: Secondary | ICD-10-CM | POA: Diagnosis not present

## 2022-12-04 DIAGNOSIS — M48062 Spinal stenosis, lumbar region with neurogenic claudication: Secondary | ICD-10-CM | POA: Diagnosis not present

## 2022-12-07 ENCOUNTER — Other Ambulatory Visit: Payer: Self-pay

## 2022-12-07 ENCOUNTER — Emergency Department (HOSPITAL_BASED_OUTPATIENT_CLINIC_OR_DEPARTMENT_OTHER)
Admission: EM | Admit: 2022-12-07 | Discharge: 2022-12-08 | Disposition: A | Discharge status: Home / Self Care | Payer: Commercial Managed Care - PPO | Attending: Emergency Medicine | Admitting: Emergency Medicine

## 2022-12-07 ENCOUNTER — Emergency Department (HOSPITAL_BASED_OUTPATIENT_CLINIC_OR_DEPARTMENT_OTHER): Payer: Commercial Managed Care - PPO

## 2022-12-07 ENCOUNTER — Encounter (HOSPITAL_BASED_OUTPATIENT_CLINIC_OR_DEPARTMENT_OTHER): Payer: Self-pay

## 2022-12-07 DIAGNOSIS — Z79899 Other long term (current) drug therapy: Secondary | ICD-10-CM | POA: Insufficient documentation

## 2022-12-07 DIAGNOSIS — Z90710 Acquired absence of both cervix and uterus: Secondary | ICD-10-CM | POA: Diagnosis not present

## 2022-12-07 DIAGNOSIS — R300 Dysuria: Secondary | ICD-10-CM | POA: Insufficient documentation

## 2022-12-07 DIAGNOSIS — I10 Essential (primary) hypertension: Secondary | ICD-10-CM | POA: Insufficient documentation

## 2022-12-07 DIAGNOSIS — R1031 Right lower quadrant pain: Secondary | ICD-10-CM | POA: Diagnosis not present

## 2022-12-07 DIAGNOSIS — K5900 Constipation, unspecified: Secondary | ICD-10-CM | POA: Insufficient documentation

## 2022-12-07 DIAGNOSIS — R1030 Lower abdominal pain, unspecified: Secondary | ICD-10-CM | POA: Diagnosis present

## 2022-12-07 DIAGNOSIS — Z9049 Acquired absence of other specified parts of digestive tract: Secondary | ICD-10-CM | POA: Diagnosis not present

## 2022-12-07 LAB — COMPREHENSIVE METABOLIC PANEL
ALT: 18 U/L (ref 0–44)
AST: 22 U/L (ref 15–41)
Albumin: 4.3 g/dL (ref 3.5–5.0)
Alkaline Phosphatase: 59 U/L (ref 38–126)
Anion gap: 6 (ref 5–15)
BUN: 16 mg/dL (ref 6–20)
CO2: 26 mmol/L (ref 22–32)
Calcium: 9.3 mg/dL (ref 8.9–10.3)
Chloride: 103 mmol/L (ref 98–111)
Creatinine, Ser: 1.04 mg/dL — ABNORMAL HIGH (ref 0.44–1.00)
GFR, Estimated: >60 mL/min (ref >60)
Glucose, Bld: 97 mg/dL (ref 70–99)
Potassium: 4.2 mmol/L (ref 3.5–5.1)
Sodium: 135 mmol/L (ref 135–145)
Total Bilirubin: 0.3 mg/dL (ref 0.3–1.2)
Total Protein: 7.7 g/dL (ref 6.5–8.1)

## 2022-12-07 LAB — URINALYSIS, ROUTINE W REFLEX MICROSCOPIC
Bilirubin Urine: NEGATIVE
Glucose, UA: NEGATIVE mg/dL
Hgb urine dipstick: NEGATIVE
Ketones, ur: NEGATIVE mg/dL
Leukocytes,Ua: NEGATIVE
Nitrite: NEGATIVE
Protein, ur: NEGATIVE mg/dL
Specific Gravity, Urine: 1.005 (ref 1.005–1.030)
pH: 6.5 (ref 5.0–8.0)

## 2022-12-07 LAB — CBC WITH DIFFERENTIAL/PLATELET
Abs Immature Granulocytes: 0.01 10*3/uL (ref 0.00–0.07)
Basophils Absolute: 0 10*3/uL (ref 0.0–0.1)
Basophils Relative: 1 %
Eosinophils Absolute: 0.2 10*3/uL (ref 0.0–0.5)
Eosinophils Relative: 2 %
HCT: 34.5 % — ABNORMAL LOW (ref 36.0–46.0)
Hemoglobin: 12 g/dL (ref 12.0–15.0)
Immature Granulocytes: 0 %
Lymphocytes Relative: 53 %
Lymphs Abs: 4.1 10*3/uL — ABNORMAL HIGH (ref 0.7–4.0)
MCH: 31.1 pg (ref 26.0–34.0)
MCHC: 34.8 g/dL (ref 30.0–36.0)
MCV: 89.4 fL (ref 80.0–100.0)
Monocytes Absolute: 0.7 10*3/uL (ref 0.1–1.0)
Monocytes Relative: 9 %
Neutro Abs: 2.7 10*3/uL (ref 1.7–7.7)
Neutrophils Relative %: 35 %
Platelets: 331 10*3/uL (ref 150–400)
RBC: 3.86 MIL/uL — ABNORMAL LOW (ref 3.87–5.11)
RDW: 12 % (ref 11.5–15.5)
WBC: 7.8 10*3/uL (ref 4.0–10.5)
nRBC: 0 % (ref 0.0–0.2)

## 2022-12-07 LAB — LIPASE, BLOOD: Lipase: 30 U/L (ref 11–51)

## 2022-12-07 MED ORDER — POLYETHYLENE GLYCOL 3350 17 GM/SCOOP PO POWD
17.0000 g | Freq: Every day | ORAL | 0 refills | Status: DC
Start: 2022-12-07 — End: 2023-11-20
  Filled 2022-12-07: qty 238, 14d supply, fill #0

## 2022-12-07 MED ORDER — IOHEXOL 300 MG/ML  SOLN
100.0000 mL | Freq: Once | INTRAMUSCULAR | Status: AC | PRN
  Administered 2022-12-07: 100 mL via INTRAVENOUS

## 2022-12-07 NOTE — ED Triage Notes (Signed)
Pt presents with R adnexal pelvic pain that is worsened with urination, defecation, and movements. Pt is sexually active with same female partner for 5 years.  Denies vaginal bleeding or discharge.

## 2022-12-07 NOTE — ED Notes (Signed)
Pt unable to give a urine sample at this time. Specimen cup at bs.

## 2022-12-07 NOTE — ED Provider Notes (Signed)
[place] EMERGENCY DEPARTMENT AT [facility] Provider Note   CSN: [id number] Arrival date & time: 12/07/22  2049     History  Chief Complaint  Patient presents with   Dysuria    Claudia Wall is a 47 y.o. female.  The history is provided by the patient. No language interpreter was used.  Dysuria    47 year old female with significant history of obesity, uterine fibroid, hypertension, PCOS presenting complaining of abdominal pain.  Patient report for the past 2 to 3 days she has had recurrent pain to her abdomen.  Pain is localized to her lower abdomen described as a sharp stabbing sensation waxing waning waking her up at night with some associated chills.  She feels some pressure when she urinate but denies any burning urination.  She feels constipated but still having bowel movement and her last bowel movement was earlier today.  She notes some nausea without vomiting no fever no chest pain or shortness of breath no dysuria hematuria vaginal bleeding or vaginal discharge.  She has had partial hysterectomy in the past due to her uterine fibroid.  She does not endorse any significant treatment tried at home.  No new sexual partner.  Home Medications Prior to Admission medications   Medication Sig Start Date End Date Taking? Authorizing Provider  amLODipine (NORVASC) 5 MG tablet Take 1 tablet (5 mg total) by mouth daily. 04/15/22 04/10/23  Alver Sorrow, NP  baclofen (LIORESAL) 10 MG tablet Take one tablet (10 mg dose) by mouth 3 (three) times a day for 30 days. 10/17/22     Blood Pressure Monitoring (ADULT BLOOD PRESSURE CUFF LG) KIT Check your blood pressure once daily. Keep a written log and bring to your next doctor's appointment. 08/23/20   Maury Dus, MD  fluconazole (DIFLUCAN) 150 MG tablet Take 1 tablet (150 mg total) by mouth as directed. May repeat in 3 days 06/26/22   Freddy Finner, NP  ibuprofen (ADVIL) 400 MG tablet Take 400 mg by mouth every 6 (six) hours  as needed. Patient not taking: Reported on 11/28/2022    [provider]  lidocaine (LIDODERM) 5 % Place 1 patch onto the skin daily. Remove & Discard patch within 12 hours or as directed by your provider. 08/07/21   Cristie Hem, PA-C  metoprolol succinate (TOPROL XL) 25 MG 24 hr tablet Take 1 tablet (25 mg total) by mouth daily. 11/28/22   Alver Sorrow, NP  olmesartan (BENICAR) 40 MG tablet Take 1 tablet (40 mg total) by mouth daily. 06/27/22   Alver Sorrow, NP  Semaglutide-Weight Management (WEGOVY) 2.4 MG/0.75ML SOAJ Inject 2.4 mg into the skin once a week. Patient not taking: Reported on 11/28/2022 05/20/22   Chilton Si, MD  spironolactone (ALDACTONE) 25 MG tablet Take 1 tablet (25 mg total) by mouth daily. 11/28/22   Alver Sorrow, NP      Allergies    Hydrocodone, Oxycodone, and Tylenol [acetaminophen]    Review of Systems   Review of Systems  Genitourinary:  Positive for dysuria.  All other systems reviewed and are negative.   Physical Exam Updated Vital Signs BP (!) 134/98 (BP Location: Right Arm)   Pulse 90   Temp 98.4 F (36.9 C) (Oral)   Resp 19   Ht 5\' 3"  (1.6 m)   Wt 110.7 kg   LMP  (LMP Unknown) Comment: PARTIAL HYSTERECTOMY  SpO2 98%   BMI 43.22 kg/m  Physical Exam Vitals and nursing note reviewed.  Constitutional:      General: She is not in acute distress.    Appearance: She is well-developed. She is obese.  HENT:     Head: Atraumatic.  Eyes:     Conjunctiva/sclera: Conjunctivae normal.  Cardiovascular:     Rate and Rhythm: Normal rate and regular rhythm.     Pulses: Normal pulses.     Heart sounds: Normal heart sounds.  Pulmonary:     Effort: Pulmonary effort is normal.  Abdominal:     General: Abdomen is flat.     Palpations: Abdomen is soft.     Tenderness: There is abdominal tenderness (Mild tenderness along the lower pannus on palpation without focal point tenderness.).  Musculoskeletal:     Cervical back: Neck  supple.  Skin:    Findings: No rash.  Neurological:     Mental Status: She is alert.  Psychiatric:        Mood and Affect: Mood normal.     ED Results / Procedures / Treatments   Labs (all labs ordered are listed, but only abnormal results are displayed) Labs Reviewed  URINALYSIS, ROUTINE W REFLEX MICROSCOPIC - Abnormal; Notable for the following components:      Result Value   Color, Urine COLORLESS (*)    All other components within normal limits  CBC WITH DIFFERENTIAL/PLATELET - Abnormal; Notable for the following components:   RBC 3.86 (*)    HCT 34.5 (*)    Lymphs Abs 4.1 (*)    All other components within normal limits  COMPREHENSIVE METABOLIC PANEL - Abnormal; Notable for the following components:   Creatinine, Ser 1.04 (*)    All other components within normal limits  LIPASE, BLOOD    EKG None  Radiology CT ABDOMEN PELVIS W CONTRAST  Result Date: 12/07/2022 CLINICAL DATA:  Right lower quadrant abdominal pain EXAM: CT ABDOMEN AND PELVIS WITH CONTRAST TECHNIQUE: Multidetector CT imaging of the abdomen and pelvis was performed using the standard protocol following bolus administration of intravenous contrast. RADIATION DOSE REDUCTION: This exam was performed according to the departmental dose-optimization program which includes automated exposure control, adjustment of the mA and/or kV according to patient size and/or use of iterative reconstruction technique. CONTRAST:  OMNIPAQUE IOHEXOL 300 MG/ML  SOLN COMPARISON:  CT abdomen and pelvis 07/10/2008 FINDINGS: Lower chest: No acute abnormality. Hepatobiliary: No focal liver abnormality is seen. Status post cholecystectomy. No biliary dilatation. Pancreas: Unremarkable. No pancreatic ductal dilatation or surrounding inflammatory changes. Spleen: Normal in size without focal abnormality. Adrenals/Urinary Tract: Adrenal glands are unremarkable. Kidneys are normal, without renal calculi, focal lesion, or hydronephrosis.  Bladder is unremarkable. Stomach/Bowel: Stomach is within normal limits. Appendix appears normal. No evidence of bowel wall thickening, distention, or inflammatory changes. Vascular/Lymphatic: No significant vascular findings are present. No enlarged abdominal or pelvic lymph nodes. Reproductive: Status post hysterectomy. No adnexal masses. Other: No abdominal wall hernia or abnormality. No abdominopelvic ascites. Musculoskeletal: No acute or significant osseous findings. IMPRESSION: 1. No CT evidence of acute abdominal/pelvic process. Normal appendix. 2. Status post cholecystectomy and hysterectomy. Electronically Signed   By: Darliss Cheney M.D.   On: 12/07/2022 23:19    Procedures Procedures    Medications Ordered in ED Medications  iohexol (OMNIPAQUE) 300 MG/ML solution 100 mL (100 mLs Intravenous Contrast Given 12/07/22 2253)    ED Course/ Medical Decision Making/ A&P  Medical Decision Making Amount and/or Complexity of Data Reviewed Labs: ordered. Radiology: ordered.  Risk Prescription drug management.   BP (!) 134/98 (BP Location: Right Arm)   Pulse 90   Temp 98.4 F (36.9 C) (Oral)   Resp 19   Ht 5\' 3"  (1.6 m)   Wt 110.7 kg   LMP  (LMP Unknown) Comment: PARTIAL HYSTERECTOMY  SpO2 98%   BMI 43.22 kg/m   47:19 PM  47 year old female with significant history of obesity, uterine fibroid, hypertension, PCOS presenting complaining of abdominal pain.  Patient report for the past 2 to 3 days she has had recurrent pain to her abdomen.  Pain is localized to her lower abdomen described as a sharp stabbing sensation waxing waning waking her up at night with some associated chills.  She feels some pressure when she urinate but denies any burning urination.  She feels constipated but still having bowel movement and her last bowel movement was earlier today.  She notes some nausea without vomiting no fever no chest pain or shortness of breath no dysuria  hematuria vaginal bleeding or vaginal discharge.  She has had partial hysterectomy in the past due to her uterine fibroid.  She does not endorse any significant treatment tried at home.  No new sexual partner.  On exam this is an obese female sitting in the bed in no acute discomfort.  Heart with normal rate and rhythm, lungs are clear to auscultation bilaterally abdomen is soft with some tenderness along lower pannus but no guarding or rebound tenderness.  No CVA tenderness.  Vital sign overall reassuring.  -Labs ordered, independently viewed and interpreted by me.  Labs remarkable for ua without UTI.  Normal WBC, electrolytes panel are reassuring, normal lipase -The patient was maintained on a cardiac monitor.  I personally viewed and interpreted the cardiac monitored which showed an underlying rhythm of: NSR -Imaging independently viewed and interpreted by me and I agree with radiologist's interpretation.  Result remarkable for abd/pelvis CT -This patient presents to the ED for concern of abd pain, this involves an extensive number of treatment options, and is a complaint that carries with it a high risk of complications and morbidity.  The differential diagnosis includes appendicitis, colitis, diverticulitis, kidney stone, uti, pyelonephritis, pancreatitis, constipation -Co morbidities that complicate the patient evaluation includes GERD, obesity, stress -Treatment includes reassurance -Reevaluation of the patient after these medicines showed that the patient improved -PCP office notes or outside notes reviewed -Escalation to admission/observation considered: patients feels much better, is comfortable with discharge, and will follow up with PCP -Prescription medication considered, patient comfortable with miralax -Social Determinant of Health considered which includes tobacco use, depression         Final Clinical Impression(s) / ED Diagnoses Final diagnoses:  Constipation, unspecified  constipation type    Rx / DC Orders ED Discharge Orders          Ordered    polyethylene glycol powder (GLYCOLAX/MIRALAX) 17 GM/SCOOP powder  Daily        12/07/22 2336              Fayrene Helper, PA-C 12/07/22 2337    Melene Plan, DO 12/08/22 1455

## 2022-12-07 NOTE — Discharge Instructions (Signed)
You have been evaluated for your symptoms.  Fortunately CT scan and your labs today did not show any concerning finding.  Your symptoms may be due to constipation.  Please use MiraLAX as prescribed and follow-up closely with your doctor for further care.  Return if you have any concern.

## 2022-12-08 ENCOUNTER — Other Ambulatory Visit (HOSPITAL_COMMUNITY): Payer: Self-pay

## 2022-12-09 ENCOUNTER — Other Ambulatory Visit (HOSPITAL_COMMUNITY): Payer: Self-pay

## 2022-12-11 ENCOUNTER — Other Ambulatory Visit (HOSPITAL_COMMUNITY): Payer: Self-pay

## 2022-12-11 DIAGNOSIS — M47816 Spondylosis without myelopathy or radiculopathy, lumbar region: Secondary | ICD-10-CM | POA: Diagnosis not present

## 2022-12-11 DIAGNOSIS — M48062 Spinal stenosis, lumbar region with neurogenic claudication: Secondary | ICD-10-CM | POA: Diagnosis not present

## 2022-12-18 DIAGNOSIS — M47816 Spondylosis without myelopathy or radiculopathy, lumbar region: Secondary | ICD-10-CM | POA: Diagnosis not present

## 2022-12-18 DIAGNOSIS — M48062 Spinal stenosis, lumbar region with neurogenic claudication: Secondary | ICD-10-CM | POA: Diagnosis not present

## 2022-12-25 DIAGNOSIS — M47816 Spondylosis without myelopathy or radiculopathy, lumbar region: Secondary | ICD-10-CM | POA: Diagnosis not present

## 2022-12-25 DIAGNOSIS — M48062 Spinal stenosis, lumbar region with neurogenic claudication: Secondary | ICD-10-CM | POA: Diagnosis not present

## 2023-01-08 DIAGNOSIS — M48062 Spinal stenosis, lumbar region with neurogenic claudication: Secondary | ICD-10-CM | POA: Diagnosis not present

## 2023-01-08 DIAGNOSIS — M47816 Spondylosis without myelopathy or radiculopathy, lumbar region: Secondary | ICD-10-CM | POA: Diagnosis not present

## 2023-01-15 ENCOUNTER — Other Ambulatory Visit (HOSPITAL_COMMUNITY): Payer: Self-pay

## 2023-01-15 DIAGNOSIS — M48062 Spinal stenosis, lumbar region with neurogenic claudication: Secondary | ICD-10-CM | POA: Diagnosis not present

## 2023-01-15 DIAGNOSIS — M47816 Spondylosis without myelopathy or radiculopathy, lumbar region: Secondary | ICD-10-CM | POA: Diagnosis not present

## 2023-01-29 DIAGNOSIS — M48062 Spinal stenosis, lumbar region with neurogenic claudication: Secondary | ICD-10-CM | POA: Diagnosis not present

## 2023-01-29 DIAGNOSIS — M47816 Spondylosis without myelopathy or radiculopathy, lumbar region: Secondary | ICD-10-CM | POA: Diagnosis not present

## 2023-02-05 DIAGNOSIS — M48062 Spinal stenosis, lumbar region with neurogenic claudication: Secondary | ICD-10-CM | POA: Diagnosis not present

## 2023-02-05 DIAGNOSIS — M47816 Spondylosis without myelopathy or radiculopathy, lumbar region: Secondary | ICD-10-CM | POA: Diagnosis not present

## 2023-02-13 ENCOUNTER — Other Ambulatory Visit: Payer: Self-pay

## 2023-02-13 ENCOUNTER — Encounter: Payer: Self-pay | Admitting: Internal Medicine

## 2023-02-13 ENCOUNTER — Ambulatory Visit: Payer: Commercial Managed Care - PPO | Admitting: Orthopaedic Surgery

## 2023-02-13 ENCOUNTER — Other Ambulatory Visit (HOSPITAL_COMMUNITY): Payer: Self-pay

## 2023-02-13 ENCOUNTER — Ambulatory Visit (INDEPENDENT_AMBULATORY_CARE_PROVIDER_SITE_OTHER): Payer: Commercial Managed Care - PPO | Admitting: Internal Medicine

## 2023-02-13 VITALS — BP 132/70 | HR 66 | Temp 98.0°F | Resp 16 | Ht 63.0 in | Wt 245.3 lb

## 2023-02-13 DIAGNOSIS — G5601 Carpal tunnel syndrome, right upper limb: Secondary | ICD-10-CM

## 2023-02-13 DIAGNOSIS — M47816 Spondylosis without myelopathy or radiculopathy, lumbar region: Secondary | ICD-10-CM | POA: Diagnosis not present

## 2023-02-13 DIAGNOSIS — L299 Pruritus, unspecified: Secondary | ICD-10-CM | POA: Diagnosis not present

## 2023-02-13 MED ORDER — BUPIVACAINE HCL 0.5 % IJ SOLN
1.0000 mL | INTRAMUSCULAR | Status: AC | PRN
Start: 2023-02-13 — End: 2023-02-13
  Administered 2023-02-13: 1 mL

## 2023-02-13 MED ORDER — METHYLPREDNISOLONE ACETATE 40 MG/ML IJ SUSP
40.0000 mg | INTRAMUSCULAR | Status: AC | PRN
Start: 2023-02-13 — End: 2023-02-13
  Administered 2023-02-13: 40 mg

## 2023-02-13 MED ORDER — LIDOCAINE HCL 1 % IJ SOLN
1.0000 mL | INTRAMUSCULAR | Status: AC | PRN
Start: 2023-02-13 — End: 2023-02-13
  Administered 2023-02-13: 1 mL

## 2023-02-13 NOTE — Progress Notes (Signed)
Office Visit Note   Patient: Claudia Wall           Date of Birth: January 15, 1976           MRN: [id number] Visit Date: 02/13/2023              Requested by: Jerre Simon, MD [address] [place],  [place] [address] PCP: Jerre Simon, MD   Assessment & Plan: Visit Diagnoses:  1. Right carpal tunnel syndrome     Plan: Claudia Wall is a 47 year old female with right carpal tunnel syndrome.  She had a good response from the prior cortisone injections so we will repeat this today.  We will see her back as needed.  She tolerated the injection well.  Follow-Up Instructions: No follow-ups on file.   Orders:  No orders of the defined types were placed in this encounter.  No orders of the defined types were placed in this encounter.     Procedures: Hand/UE Inj: R carpal tunnel for carpal tunnel syndrome on 02/13/2023 2:51 PM Indications: pain Details: 25 G needle Medications: 1 mL lidocaine 1 %; 1 mL bupivacaine 0.5 %; 40 mg methylPREDNISolone acetate 40 MG/ML Outcome: tolerated well, no immediate complications Patient was prepped and draped in the usual sterile fashion.       Clinical Data: No additional findings.   Subjective: Chief Complaint  Patient presents with   Right Hand - Pain    HPI Claudia Wall returns today for follow-up evaluation of right carpal tunnel syndrome.  We did a cortisone injection 2 years ago and she felt good relief until about 6 months ago.  Now she is waking up due to the pain.  She is requesting another injection today.  Review of Systems  Constitutional: Negative.   HENT: Negative.    Eyes: Negative.   Respiratory: Negative.    Cardiovascular: Negative.   Endocrine: Negative.   Musculoskeletal: Negative.   Neurological: Negative.   Hematological: Negative.   Psychiatric/Behavioral: Negative.    All other systems reviewed and are negative.    Objective: Vital Signs: LMP  (LMP Unknown) Comment: PARTIAL HYSTERECTOMY  Physical  Exam Vitals and nursing note reviewed.  Constitutional:      Appearance: She is well-developed.  HENT:     Head: Normocephalic and atraumatic.  Pulmonary:     Effort: Pulmonary effort is normal.  Abdominal:     Palpations: Abdomen is soft.  Musculoskeletal:     Cervical back: Neck supple.  Skin:    General: Skin is warm.     Capillary Refill: Capillary refill takes less than 2 seconds.  Neurological:     Mental Status: She is alert and oriented to person, place, and time.  Psychiatric:        Behavior: Behavior normal.        Thought Content: Thought content normal.        Judgment: Judgment normal.     Ortho Exam Exam of the right hand is unchanged from prior visit. Specialty Comments:  No specialty comments available.  Imaging: No results found.   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   Anxiety 10/20/2020   Cervical dysplasia 06/09/2019   Uterine polyp 04/20/2019   Chronic bilateral low back pain without sciatica 09/23/2018   Obesity, Class III, BMI 40-49.9 (morbid obesity) (HCC) 09/23/2018  Pelvic pain 07/18/2018   Bilateral carpal tunnel syndrome 02/09/2018   Acute lumbar myofascial strain 12/29/2017   Chronic post-traumatic stress disorder (PTSD) 12/29/2017   Morbid obesity (HCC) 11/13/2017   Insomnia due to stress 11/09/2017   Chronic toe pain, left foot 11/09/2017   Other hyperlipidemia 05/13/2017   Vitamin D deficiency 05/13/2017   Prediabetes 05/13/2017   Enlarged thyroid 04/29/2017   PCOS (polycystic ovarian syndrome) 04/29/2017   Irregular periods/menstrual cycles 02/12/2017   Vaginal dryness 02/12/2017   Left carpal tunnel syndrome 07/08/2016   Carpal tunnel syndrome, right upper limb 07/07/2013   Palpitations 10/27/2011   Dyshidrotic eczema 11/06/2008   Tobacco use disorder  11/01/2008   BMI 45.0-49.9, adult (HCC) 02/03/2007   Essential hypertension 05/28/2006   GERD (gastroesophageal reflux disease) 05/28/2006   Past Medical History:  Diagnosis Date   Alcohol abuse    Hx, occ on weekends   Anxiety    Constipation    Depression    GERD (gastroesophageal reflux disease)    HTN (hypertension)    Irregular periods/menstrual cycles    Joint pain    Lactose intolerance    Leg edema    goes away when props legs up per pt   lower Back pain    Mastitis    h/o right   Obesity    PCOS (polycystic ovarian syndrome)    Pre-diabetes    PTSD (post-traumatic stress disorder)    Sleep apnea    no cpap used due to cost per pt   Tobacco abuse    Stopped 2021   Uterine fibroid     Family History  Problem Relation Age of Onset   Cancer Mother    Hypertension Mother    Heart disease Mother    Depression Mother    Bipolar disorder Mother    Schizophrenia Mother    Alcoholism Mother    Drug abuse Mother    Obesity Mother    Hypertension Father    Diabetes Father    Heart attack Maternal Aunt    Heart failure Maternal Grandmother     Past Surgical History:  Procedure Laterality Date   CHOLECYSTECTOMY  03/31/2000   INCISION AND DRAINAGE BREAST ABSCESS     yrs ago   IR RADIOLOGIST EVAL & MGMT  04/03/2021   IR RADIOLOGIST EVAL & MGMT  04/26/2021   left carpal tunnel release surgery  05/2018   ROBOTIC ASSISTED LAPAROSCOPIC HYSTERECTOMY AND SALPINGECTOMY Bilateral 09/27/2021   Procedure: XI ROBOTIC ASSISTED LAPAROSCOPIC HYSTERECTOMY AND SALPINGECTOMY;  Surgeon: Shea Evans, MD;  Location: [facility];  Service: Gynecology;  Laterality: Bilateral;   Social History   Occupational History   Not on file  Tobacco Use   Smoking status: Former    Current packs/day: 0.00    Average packs/day: 0.3 packs/day for 25.0 years (6.3 ttl pk-yrs)    Types: Cigarettes    Start date: 65    Quit date: 2022    Years since quitting: 2.8   Smokeless  tobacco: Never   Tobacco comments:    "used to smoke 1/2 to 1ppd depending on stress"  Vaping Use   Vaping status: Never Used  Substance and Sexual Activity   Alcohol use: Yes    Alcohol/week: 3.0 standard drinks of alcohol    Types: 3 Glasses of wine per week    Comment: occassional   Drug use: No   Sexual activity: Yes    Partners: Female    Birth control/protection: Other-see comments,  None    Comment: for past 21 years

## 2023-02-13 NOTE — Patient Instructions (Addendum)
Pruritus:  - Likely due to dry skin. No rashes. - labwork previously normal: 05/2022: normal TSH, 10/2022: A1c with prediabetes, 11/2022: normal renal/liver function and blood counts  - SPT 01/2023: negative to aeroallergens  - Do a daily soaking tub bath in warm water for 10-15 minutes.  - Use a gentle, unscented cleanser at the end of the bath (such as Dove unscented bar or baby wash, or Aveeno sensitive body wash). Then rinse, pat half-way dry, and apply a gentle, unscented moisturizer cream or ointment (Cerave, Cetaphil, Eucerin, Aveeno, Vanicream, Vaseline)  all over while still damp. Dry skin makes the itching and rash of eczema worse. The skin should be moisturized with a gentle, unscented moisturizer at least twice daily.  - Use only unscented liquid laundry detergent.

## 2023-02-13 NOTE — Progress Notes (Signed)
NEW PATIENT  Date of Service/Encounter:  02/13/23  Consult requested by: Jerre Simon, MD   Subjective:   Claudia Wall (DOB: 1975/07/18) is a 47 y.o. female who presents to the clinic on 02/13/2023 with a chief complaint of Allergy Testing (Environmental: ALL/Food: No Hx) and Eczema (????) .    History obtained from: chart review and patient.   Ongoing for almost 10 years.  No rashes, just gets itchy all over.  Occurs throughout the year, no seasonality.  Worse in evening time.  Lotions cause burning of skin.  She does not moisturize well.  Uses fragrances in products also.   Also on olemsartan-hydrochlorothiazide, beta blocker for palpitations and spironolactone for LE edema, started around 2023 after the itching started.    Past Medical History: Past Medical History:  Diagnosis Date   Alcohol abuse    Hx, occ on weekends   Anxiety    Constipation    Depression    GERD (gastroesophageal reflux disease)    HTN (hypertension)    Irregular periods/menstrual cycles    Joint pain    Lactose intolerance    Leg edema    goes away when props legs up per pt   lower Back pain    Mastitis    h/o right   Obesity    PCOS (polycystic ovarian syndrome)    Pre-diabetes    PTSD (post-traumatic stress disorder)    Sleep apnea    no cpap used due to cost per pt   Tobacco abuse    Stopped 2021   Uterine fibroid     Past Surgical History: Past Surgical History:  Procedure Laterality Date   CHOLECYSTECTOMY  03/31/2000   INCISION AND DRAINAGE BREAST ABSCESS     yrs ago   IR RADIOLOGIST EVAL & MGMT  04/03/2021   IR RADIOLOGIST EVAL & MGMT  04/26/2021   left carpal tunnel release surgery  05/2018   ROBOTIC ASSISTED LAPAROSCOPIC HYSTERECTOMY AND SALPINGECTOMY Bilateral 09/27/2021   Procedure: XI ROBOTIC ASSISTED LAPAROSCOPIC HYSTERECTOMY AND SALPINGECTOMY;  Surgeon: Shea Evans, MD;  Location: [facility];  Service: Gynecology;  Laterality: Bilateral;     Family History: Family History  Problem Relation Age of Onset   Cancer Mother    Hypertension Mother    Heart disease Mother    Depression Mother    Bipolar disorder Mother    Schizophrenia Mother    Alcoholism Mother    Drug abuse Mother    Obesity Mother    Hypertension Father    Diabetes Father    Heart attack Maternal Aunt    Heart failure Maternal Grandmother     Social History:  Flooring in bedroom: carpet Pets: none Tobacco use/exposure: prior 1992-2020 1/2ppd  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 If you have any questions, ask your nurse or doctor.          STOP taking these medications    fluconazole 150 MG tablet Commonly known as: Diflucan Stopped by: Birder Robson   Wegovy 2.4 MG/0.75ML Soaj Generic drug: Semaglutide-Weight Management Stopped by: Birder Robson       TAKE  these medications    Adult Blood Pressure Cuff Lg Kit Check your blood pressure once daily. Keep a written log and bring to your next doctor's appointment.   amLODipine 5 MG tablet Commonly known as: NORVASC Take 1 tablet (5 mg total) by mouth daily.   baclofen 10 MG tablet Commonly known as: LIORESAL Take one tablet (10 mg dose) by mouth 3 (three) times a day for 30 days.   diphenhydrAMINE 25 mg capsule Commonly known as: BENADRYL Take 25 mg by mouth at bedtime as needed for itching.   hydrOXYzine 25 MG tablet Commonly known as: ATARAX Take 25 mg by mouth every 6 (six) hours as needed for anxiety.   ibuprofen 400 MG tablet Commonly known as: ADVIL Take 400 mg by mouth every 6 (six) hours as needed.   levocetirizine 5 MG tablet Commonly known as: XYZAL Take 15 mg by mouth daily.   lidocaine 5 % Commonly known as:  Lidoderm Place 1 patch onto the skin daily. Remove & Discard patch within 12 hours or as directed by your provider.   LORazepam 0.5 MG tablet Commonly known as: ATIVAN Take 0.5 mg by mouth every 8 (eight) hours as needed for anxiety.   metoprolol succinate 25 MG 24 hr tablet Commonly known as: Toprol XL Take 1 tablet (25 mg total) by mouth daily.   olmesartan 40 MG tablet Commonly known as: BENICAR Take 1 tablet (40 mg total) by mouth daily.   omeprazole 20 MG capsule Commonly known as: PRILOSEC Take 20 mg by mouth 2 (two) times daily.   polyethylene glycol powder 17 GM/SCOOP powder Commonly known as: GLYCOLAX/MIRALAX Take 17 grams by mouth daily.   spironolactone 25 MG tablet Commonly known as: ALDACTONE Take 1 tablet (25 mg total) by mouth daily.         REVIEW OF SYSTEMS: Pertinent positives and negatives discussed in HPI.   Objective:   Physical Exam: BP 132/70 (BP Location: Right Arm, Patient Position: Sitting, Cuff Size: Large)   Pulse 66   Temp 98 F (36.7 C) (Temporal)   Resp 16   Ht 5\' 3"  (1.6 m)   Wt 245 lb 4.8 oz (111.3 kg)   LMP  (LMP Unknown) Comment: PARTIAL HYSTERECTOMY  SpO2 100%   BMI 43.45 kg/m  Body mass index is 43.45 kg/m. GEN: alert, well developed HEENT: clear conjunctiva, nose without inferior turbinate hypertrophy, pink nasal mucosa, slight clear rhinorrhea HEART: regular rate and rhythm, no murmur LUNGS: clear to auscultation bilaterally, no coughing, unlabored respiration ABDOMEN: soft, non distended  SKIN: no rashes or lesions, + diffusely dry skin   Reviewed:  11/28/2022: seen by Cardiology for palpitations, HTN, OSA, HLD, LE edema.  Improved on toprolol and spironolactone.   Skin Testing:  Skin prick testing was placed, which includes aeroallergens/foods, histamine control, and saline control.  Verbal consent was obtained prior to placing test.  Patient tolerated procedure well.  Allergy testing results were read and  interpreted by myself, documented by clinical staff. Adequate positive and negative control.  Results discussed with patient/family.  Airborne Adult Perc - 02/13/23 1143     Time Antigen Placed 1143    Allergen Manufacturer Waynette Buttery    Location Back    Number of Test 55    Panel 1 Select    1. Control-Buffer 50% Glycerol Negative    2. Control-Histamine 3+    3. Bahia Negative    4. French Southern Territ[place] Negative    5. Johnson Negative    6. [facility]  Blue Negative    7. Meadow Fescue Negative    8. Perennial Rye Negative    9. Timothy Negative    10. Ragweed Mix Negative    11. Cocklebur Negative    12. Plantain,  English Negative    13. Baccharis Negative    14. Dog Fennel Negative    15. Russian Thistle Negative    16. Lamb's Quarters Negative    17. Sheep Sorrell Negative    18. Rough Pigweed Negative    19. Marsh Elder, Rough Negative    20. Mugwort, Common Negative    21. Box, Elder Negative    22. Cedar, red Negative    23. Sweet Gum Negative    24. Pecan Pollen Negative    25. Pine Mix Negative    26. Walnut, Black Pollen Negative    27. Red Mulberry Negative    28. Ash Mix Negative    29. Birch Mix Negative    30. Beech American Negative    31. Cottonwood, Guinea[place]issau Negative    32. Hickory, White Negative    33. Maple Mix Negative    34. Oak, [place] Mix Negative    35. Sycamore Eastern Negative    36. Alternaria Alternata Negative    37. Cladosporium Herbarum Negative    38. Aspergillus Mix Negative    39. Penicillium Mix Negative    40. Bipolaris Sorokiniana (Helminthosporium) Negative    41. Drechslera Spicifera (Curvularia) Negative    42. Mucor Plumbeus Negative    43. Fusarium Moniliforme Negative    44. Aureobasidium Pullulans (pullulara) Negative    45. Rhizopus Oryzae Negative    46. Botrytis Cinera Negative    47. Epicoccum Nigrum Negative    48. Phoma Betae Negative    49. Dust Mite Mix Negative    50. Cat Hair 10,000 BAU/ml Negative    51.  Dog Epithelia  Negative    52. Mixed Feathers Negative    53. Horse Epithelia Negative    54. Cockroach, German Negative    55. Tobacco Leaf Negative               Assessment:   1. Pruritus     Plan/Recommendations:  Pruritus:  - Likely due to dry skin. No rashes. - labwork previously normal: 05/2022: normal TSH, 10/2022: A1c with prediabetes, 11/2022: normal renal/liver function and blood counts  - Considered medication side effects but her current list of meds have all been started recently in the last year while itching has been ongoing for many years prior to that. - SPT 01/2023: negative to aeroallergens  - Do a daily soaking tub bath in warm water for 10-15 minutes.  - Use a gentle, unscented cleanser at the end of the bath (such as Dove unscented bar or baby wash, or Aveeno sensitive body wash). Then rinse, pat half-way dry, and apply a gentle, unscented moisturizer cream or ointment (Cerave, Cetaphil, Eucerin, Aveeno, Vanicream, Vaseline)  all over while still damp. Dry skin makes the itching and rash of eczema worse. The skin should be moisturized with a gentle, unscented moisturizer at least twice daily.  - Use only unscented liquid laundry detergent.     Return if symptoms worsen or fail to improve.  Alesia Morin, MD [facility]

## 2023-03-10 ENCOUNTER — Ambulatory Visit: Payer: PRIVATE HEALTH INSURANCE | Admitting: Internal Medicine

## 2023-03-12 ENCOUNTER — Other Ambulatory Visit (HOSPITAL_COMMUNITY): Payer: Self-pay

## 2023-03-13 ENCOUNTER — Other Ambulatory Visit (HOSPITAL_COMMUNITY): Payer: Self-pay

## 2023-04-07 ENCOUNTER — Other Ambulatory Visit: Payer: Self-pay | Admitting: Internal Medicine

## 2023-04-07 DIAGNOSIS — L299 Pruritus, unspecified: Secondary | ICD-10-CM

## 2023-04-07 NOTE — Progress Notes (Signed)
 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  Discussed many times a cause for this is unknown but she would like a second opinion; also discussed possibility of psychogenic pruritus. Does respond to Xyzal so discussed continuation PRN.   Reports HIV/Hepatitis screen previously and it was negative.

## 2023-04-14 NOTE — Addendum Note (Signed)
 Addended by: Alfonse Spruce on: 04/14/2023 03:49 PM   Modules accepted: Orders

## 2023-04-14 NOTE — Progress Notes (Signed)
 I discussed her itching with the patient. We are going to do some more investigation to see whether this is something else going on.

## 2023-04-15 ENCOUNTER — Emergency Department (HOSPITAL_BASED_OUTPATIENT_CLINIC_OR_DEPARTMENT_OTHER): Payer: Commercial Managed Care - PPO | Admitting: Radiology

## 2023-04-15 ENCOUNTER — Other Ambulatory Visit: Payer: Self-pay

## 2023-04-15 DIAGNOSIS — Z79899 Other long term (current) drug therapy: Secondary | ICD-10-CM | POA: Insufficient documentation

## 2023-04-15 DIAGNOSIS — I10 Essential (primary) hypertension: Secondary | ICD-10-CM | POA: Insufficient documentation

## 2023-04-15 DIAGNOSIS — Z20822 Contact with and (suspected) exposure to covid-19: Secondary | ICD-10-CM | POA: Insufficient documentation

## 2023-04-15 DIAGNOSIS — R0789 Other chest pain: Secondary | ICD-10-CM | POA: Insufficient documentation

## 2023-04-15 DIAGNOSIS — R079 Chest pain, unspecified: Secondary | ICD-10-CM | POA: Diagnosis not present

## 2023-04-15 LAB — CBC
HCT: 36.8 % (ref 36.0–46.0)
Hemoglobin: 12.7 g/dL (ref 12.0–15.0)
MCH: 31 pg (ref 26.0–34.0)
MCHC: 34.5 g/dL (ref 30.0–36.0)
MCV: 89.8 fL (ref 80.0–100.0)
Platelets: 336 10*3/uL (ref 150–400)
RBC: 4.1 MIL/uL (ref 3.87–5.11)
RDW: 12.3 % (ref 11.5–15.5)
WBC: 7.3 10*3/uL (ref 4.0–10.5)
nRBC: 0 % (ref 0.0–0.2)

## 2023-04-15 LAB — BASIC METABOLIC PANEL
Calcium: 9.5 mg/dL (ref 8.9–10.3)
Creatinine, Ser: 1.04 mg/dL — ABNORMAL HIGH (ref 0.44–1.00)
GFR, Estimated: >60 mL/min (ref >60)
Glucose, Bld: 110 mg/dL — ABNORMAL HIGH (ref 70–99)
Potassium: 3.8 mmol/L (ref 3.5–5.1)

## 2023-04-15 LAB — BASIC METABOLIC PANEL WITH GFR
Anion gap: 6 (ref 5–15)
BUN: 17 mg/dL (ref 6–20)
CO2: 26 mmol/L (ref 22–32)
Chloride: 104 mmol/L (ref 98–111)
Sodium: 136 mmol/L (ref 135–145)

## 2023-04-15 LAB — RESP PANEL BY RT-PCR (RSV, FLU A&B, COVID)  RVPGX2
Influenza A by PCR: NEGATIVE
Influenza B by PCR: NEGATIVE
Resp Syncytial Virus by PCR: NEGATIVE
SARS Coronavirus 2 by RT PCR: NEGATIVE

## 2023-04-15 LAB — TROPONIN I (HIGH SENSITIVITY): Troponin I (High Sensitivity): 2 ng/L (ref <18)

## 2023-04-15 NOTE — ED Triage Notes (Signed)
 Pt POV from home reporting L ribcage with inspiration for past week, denies cough or injury, also reports fatigue.

## 2023-04-16 ENCOUNTER — Emergency Department (HOSPITAL_BASED_OUTPATIENT_CLINIC_OR_DEPARTMENT_OTHER)
Admission: EM | Admit: 2023-04-16 | Discharge: 2023-04-16 | Disposition: A | Discharge status: Home / Self Care | Payer: Commercial Managed Care - PPO | Attending: Emergency Medicine | Admitting: Emergency Medicine

## 2023-04-16 DIAGNOSIS — R0789 Other chest pain: Secondary | ICD-10-CM

## 2023-04-16 MED ORDER — NAPROXEN 500 MG PO TABS: 500.0000 mg | ORAL_TABLET | Freq: Two times a day (BID) | ORAL | 0 refills | Status: DC

## 2023-04-16 NOTE — Discharge Instructions (Signed)
Begin taking naproxen as prescribed.  Rest.  Follow-up with primary doctor if not improving in the next few days and return to the ER if if you develop worsening pain, difficulty breathing, or for other new and concerning symptoms.

## 2023-04-16 NOTE — ED Provider Notes (Signed)
[place] EMERGENCY DEPARTMENT AT [facility] Provider Note   CSN: [id number] Arrival date & time: 04/15/23  1802     History  Chief Complaint  Patient presents with   Fatigue   Chest Pain    CARDIN MILTIMORE is a 48 y.o. female.  Patient is a 48 year old female with history of hypertension, anxiety, GERD.  Patient presenting today for evaluation of chest discomfort.  She describes a 1 week history of discomfort to the left lateral ribs that is worse when she breathes or moves.  She denies feeling shortness of breath.  No cough, no fevers or chills.  She denies any injury or trauma.  The history is provided by the patient.       Home Medications Prior to Admission medications   Medication Sig Start Date End Date Taking? Authorizing Provider  amLODipine (NORVASC) 5 MG tablet Take 1 tablet (5 mg total) by mouth daily. 04/15/22 05/14/23  Alver Sorrow, NP  baclofen (LIORESAL) 10 MG tablet Take one tablet (10 mg dose) by mouth 3 (three) times a day for 30 days. 10/17/22     Blood Pressure Monitoring (ADULT BLOOD PRESSURE CUFF LG) KIT Check your blood pressure once daily. Keep a written log and bring to your next doctor's appointment. 08/23/20   Maury Dus, MD  diphenhydrAMINE (BENADRYL) 25 mg capsule Take 25 mg by mouth at bedtime as needed for itching. 11/27/19   [provider]  hydrOXYzine (ATARAX) 25 MG tablet Take 25 mg by mouth every 6 (six) hours as needed for anxiety. 06/17/22   [provider]  ibuprofen (ADVIL) 400 MG tablet Take 400 mg by mouth every 6 (six) hours as needed.    [provider]  levocetirizine (XYZAL) 5 MG tablet Take 15 mg by mouth daily.    [provider]  lidocaine (LIDODERM) 5 % Place 1 patch onto the skin daily. Remove & Discard patch within 12 hours or as directed by your provider. 08/07/21   Cristie Hem, PA-C  LORazepam (ATIVAN) 0.5 MG tablet Take 0.5 mg by mouth every 8 (eight) hours as needed for  anxiety. 07/07/22   [provider]  metoprolol succinate (TOPROL XL) 25 MG 24 hr tablet Take 1 tablet (25 mg total) by mouth daily. 11/28/22   Alver Sorrow, NP  olmesartan (BENICAR) 40 MG tablet Take 1 tablet (40 mg total) by mouth daily. 06/27/22   Alver Sorrow, NP  omeprazole (PRILOSEC) 20 MG capsule Take 20 mg by mouth 2 (two) times daily.    [provider]  polyethylene glycol powder (GLYCOLAX/MIRALAX) 17 GM/SCOOP powder Take 17 grams by mouth daily. 12/07/22   Fayrene Helper, PA-C  spironolactone (ALDACTONE) 25 MG tablet Take 1 tablet (25 mg total) by mouth daily. 11/28/22   Alver Sorrow, NP      Allergies    Tramadol, Hydrocodone, Oxycodone, and Tylenol [acetaminophen]    Review of Systems   Review of Systems  All other systems reviewed and are negative.   Physical Exam Updated Vital Signs BP 122/72   Pulse 76   Temp 98.3 F (36.8 C) (Oral)   Resp 20   Ht 5\' 3"  (1.6 m)   Wt 113.4 kg   LMP  (LMP Unknown) Comment: PARTIAL HYSTERECTOMY  SpO2 100%   BMI 44.29 kg/m  Physical Exam Vitals and nursing note reviewed.  Constitutional:      General: She is not in acute distress.    Appearance: She is  well-developed. She is not diaphoretic.  HENT:     Head: Normocephalic and atraumatic.  Cardiovascular:     Rate and Rhythm: Normal rate and regular rhythm.     Heart sounds: No murmur heard.    No friction rub. No gallop.  Pulmonary:     Effort: Pulmonary effort is normal. No respiratory distress.     Breath sounds: Normal breath sounds. No wheezing.  Abdominal:     General: Bowel sounds are normal. There is no distension.     Palpations: Abdomen is soft.     Tenderness: There is no abdominal tenderness.  Musculoskeletal:        General: Normal range of motion.     Cervical back: Normal range of motion and neck supple.     Right lower leg: No tenderness. No edema.     Left lower leg: No tenderness. No edema.     Comments: Homan's sign is absent.   Skin:    General: Skin is warm and dry.  Neurological:     General: No focal deficit present.     Mental Status: She is alert and oriented to person, place, and time.     ED Results / Procedures / Treatments   Labs (all labs ordered are listed, but only abnormal results are displayed) Labs Reviewed  BASIC METABOLIC PANEL - Abnormal; Notable for the following components:      Result Value   Glucose, Bld 110 (*)    Creatinine, Ser 1.04 (*)    All other components within normal limits  RESP PANEL BY RT-PCR (RSV, FLU A&B, COVID)  RVPGX2  CBC  TROPONIN I (HIGH SENSITIVITY)  TROPONIN I (HIGH SENSITIVITY)    EKG EKG Interpretation Date/Time:  Wednesday April 15 2023 18:33:56 EST Ventricular Rate:  82 PR Interval:  160 QRS Duration:  90 QT Interval:  382 QTC Calculation: 446 R Axis:   73  Text Interpretation: Normal sinus rhythm Nonspecific T wave abnormality Abnormal ECG When compared with ECG of 11-Jan-2021 08:32, No significant change was found Confirmed by Vonita Moss [phone]) on 04/15/2023 7:12:49 PM  Radiology DG Chest 2 View Result Date: 04/15/2023 CLINICAL DATA:  Chest pain EXAM: CHEST - 2 VIEW COMPARISON:  02/25/2022 FINDINGS: Lungs are clear.  No pleural effusion or pneumothorax. The heart is normal in size. Mild degenerative changes of the visualized thoracolumbar spine. Cholecystectomy clips. IMPRESSION: Normal chest radiographs. Electronically Signed   By: Charline Bills M.D.   On: 04/15/2023 19:29    Procedures Procedures    Medications Ordered in ED Medications - No data to display  ED Course/ Medical Decision Making/ A&P  Patient is a 34 presenting with chest discomfort as described in the HPI.  Patient arrives here with stable vital signs and is afebrile there is no hypoxia or fever.  Physical examination unremarkable.  Laboratory studies obtained including CBC, metabolic panel, and troponin, all of which are normal.  Chest x-ray is  clear.  Patient's symptoms most consistent with a musculoskeletal chest wall pain.  She is having no hypoxia, no respiratory distress, and respiratory rate is 16 at the time evaluation.  There is nothing in the workup that would suggest a cardiac etiology and I doubt other emergent pathology.  I feel as though patient can safely be prescribed NSAIDs and advised past and follow-up as needed.  Final Clinical Impression(s) / ED Diagnoses Final diagnoses:  None    Rx / DC Orders ED Discharge Orders     None  Geoffery Lyons, MD 04/16/23 [phone]

## 2023-04-20 ENCOUNTER — Other Ambulatory Visit (HOSPITAL_COMMUNITY): Payer: Self-pay

## 2023-04-21 ENCOUNTER — Other Ambulatory Visit (HOSPITAL_COMMUNITY): Payer: Self-pay

## 2023-04-23 ENCOUNTER — Other Ambulatory Visit (HOSPITAL_COMMUNITY): Payer: Self-pay

## 2023-05-01 ENCOUNTER — Encounter: Payer: Self-pay | Admitting: Podiatry

## 2023-05-01 ENCOUNTER — Ambulatory Visit: Payer: Commercial Managed Care - PPO | Admitting: Podiatry

## 2023-05-01 ENCOUNTER — Other Ambulatory Visit (HOSPITAL_COMMUNITY): Payer: Self-pay

## 2023-05-01 DIAGNOSIS — M79674 Pain in right toe(s): Secondary | ICD-10-CM

## 2023-05-01 DIAGNOSIS — L600 Ingrowing nail: Secondary | ICD-10-CM | POA: Diagnosis not present

## 2023-05-01 DIAGNOSIS — B351 Tinea unguium: Secondary | ICD-10-CM | POA: Diagnosis not present

## 2023-05-01 DIAGNOSIS — M79675 Pain in left toe(s): Secondary | ICD-10-CM | POA: Diagnosis not present

## 2023-05-01 MED ORDER — TERBINAFINE HCL 250 MG PO TABS
250.0000 mg | ORAL_TABLET | Freq: Every day | ORAL | 0 refills | Status: DC
  Filled 2023-05-01 (×2): qty 90, 90d supply, fill #0

## 2023-05-01 NOTE — Progress Notes (Signed)
Chief Complaint  Patient presents with   Nail Problem    "I have an ingrown toenail." N - ingrown toenail L - hallux right D - 2 years O - gradually worse C - sharp pain, tender, sore, hard A - pressure T - I cut it a couple of days ago    Subjective: Patient presents today for evaluation of pain to the lateral border of the right great toe. Patient is concerned for possible ingrown nail.  It is very sensitive to touch.  Patient also has a long history of toenail fungus.  In the past she was prescribed oral Lamisil however she never took it.  She would like to have it evaluated again today patient presents today for further treatment and evaluation.  Past Medical History:  Diagnosis Date   Alcohol abuse    Hx, occ on weekends   Anxiety    Constipation    Depression    GERD (gastroesophageal reflux disease)    HTN (hypertension)    Irregular periods/menstrual cycles    Joint pain    Lactose intolerance    Leg edema    goes away when props legs up per pt   lower Back pain    Mastitis    h/o right   Obesity    PCOS (polycystic ovarian syndrome)    Pre-diabetes    PTSD (post-traumatic stress disorder)    Sleep apnea    no cpap used due to cost per pt   Tobacco abuse    Stopped 2021   Uterine fibroid     Past Surgical History:  Procedure Laterality Date   CHOLECYSTECTOMY  03/31/2000   INCISION AND DRAINAGE BREAST ABSCESS     yrs ago   IR RADIOLOGIST EVAL & MGMT  04/03/2021   IR RADIOLOGIST EVAL & MGMT  04/26/2021   left carpal tunnel release surgery  05/2018   ROBOTIC ASSISTED LAPAROSCOPIC HYSTERECTOMY AND SALPINGECTOMY Bilateral 09/27/2021   Procedure: XI ROBOTIC ASSISTED LAPAROSCOPIC HYSTERECTOMY AND SALPINGECTOMY;  Surgeon: Shea Kallon Caylor, MD;  Location: [facility];  Service: Gynecology;  Laterality: Bilateral;    Allergies  Allergen Reactions   Tramadol Itching   Hydrocodone     itching   Oxycodone Itching   Tylenol [Acetaminophen]      itching    RT foot 05/01/2023  LT foot 05/01/2023  Objective:  General: Well developed, nourished, in no acute distress, alert and oriented x3   Dermatology: Skin is warm, dry and supple bilateral.  Lateral border right great toe is tender with evidence of an ingrowing nail. Pain on palpation noted to the border of the nail fold. The remaining nails appear unremarkable at this time.  Hyperkeratotic dystrophic discolored nails also noted 1-5 bilateral  Vascular: DP and PT pulses palpable.  No clinical evidence of vascular compromise  Neruologic: Grossly intact via light touch bilateral.  Musculoskeletal: No pedal deformity noted  Assesement: #1 Paronychia with ingrowing nail lateral border right great toe #2 pain due to onychomycosis of toenails both  Plan of Care:  -Patient evaluated.  -Discussed treatment alternatives and plan of care. Explained nail avulsion procedure and post procedure course to patient. -Patient opted for permanent partial nail avulsion of the ingrown portion of the nail.  -Prior to procedure, local anesthesia infiltration utilized using 3 ml of a 50:50 mixture of 2% plain lidocaine and 0.5% plain marcaine in a normal hallux block fashion and a betadine prep performed.  -Partial permanent nail avulsion with chemical matrixectomy  performed using 3x30sec applications of phenol followed by alcohol flush.  -Light dressing applied.  Post care instructions provided -We also discussed different treatment options for toenail fungus.  She was prescribed Lamisil in the past but did not take it.  CMP 12/07/2022 hepatic function WNL. 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 I asked her to email me her resume  Felecia Shelling, DPM [facility]  Dr. Felecia Shelling, DPM    [address] [place], [place] [address]                Office [phone]  Fax [phone]

## 2023-05-05 ENCOUNTER — Other Ambulatory Visit (HOSPITAL_COMMUNITY): Payer: Self-pay

## 2023-05-07 ENCOUNTER — Other Ambulatory Visit (HOSPITAL_BASED_OUTPATIENT_CLINIC_OR_DEPARTMENT_OTHER): Payer: Self-pay | Admitting: Family

## 2023-05-07 DIAGNOSIS — I10 Essential (primary) hypertension: Secondary | ICD-10-CM

## 2023-05-08 ENCOUNTER — Other Ambulatory Visit (HOSPITAL_COMMUNITY): Payer: Self-pay

## 2023-05-08 MED ORDER — METOPROLOL SUCCINATE ER 25 MG PO TB24
25.0000 mg | ORAL_TABLET | Freq: Every day | ORAL | 1 refills | Status: DC
  Filled 2023-05-08 – 2023-06-08 (×2): qty 90, 90d supply, fill #0
  Filled 2023-09-15: qty 90, 90d supply, fill #1

## 2023-05-08 MED ORDER — AMLODIPINE BESYLATE 5 MG PO TABS
5.0000 mg | ORAL_TABLET | Freq: Every day | ORAL | 1 refills | Status: DC
  Filled 2023-05-08: qty 90, 90d supply, fill #0
  Filled 2023-07-21: qty 90, 90d supply, fill #1

## 2023-05-20 ENCOUNTER — Ambulatory Visit: Payer: Commercial Managed Care - PPO | Admitting: Podiatry

## 2023-05-25 ENCOUNTER — Encounter: Payer: Self-pay | Admitting: Podiatry

## 2023-05-25 ENCOUNTER — Ambulatory Visit: Payer: Commercial Managed Care - PPO | Admitting: Podiatry

## 2023-05-25 DIAGNOSIS — L600 Ingrowing nail: Secondary | ICD-10-CM | POA: Diagnosis not present

## 2023-05-25 NOTE — Progress Notes (Signed)
   Chief Complaint  Patient presents with   Nail Problem    "It still hurts and it's turning dark down at the bottom."    Subjective: 48 y.o. female presents today status post permanent nail avulsion procedure of the medial border of the right great toe that was performed on 05/01/2023.  Patient doing well.  She continues to have some slight sensitivity to the area but overall improvement.  She is also taking the oral Lamisil as prescribed.  Past Medical History:  Diagnosis Date   Alcohol abuse    Hx, occ on weekends   Anxiety    Constipation    Depression    GERD (gastroesophageal reflux disease)    HTN (hypertension)    Irregular periods/menstrual cycles    Joint pain    Lactose intolerance    Leg edema    goes away when props legs up per pt   lower Back pain    Mastitis    h/o right   Obesity    PCOS (polycystic ovarian syndrome)    Pre-diabetes    PTSD (post-traumatic stress disorder)    Sleep apnea    no cpap used due to cost per pt   Tobacco abuse    Stopped 2021   Uterine fibroid     Objective: Neurovascular status intact.  Skin is warm, dry and supple. Nail and respective nail fold appears to be healing appropriately.   Assessment: #1 s/p partial permanent nail matrixectomy lateral border right great toe #2 onychomycosis of toenails both   Plan of care: -Patient was evaluated  -Light debridement of the periungual debris was performed to the border of the respective toe and nail plate using a tissue nipper. -Continue oral Lamisil as prescribed on 05/01/2023 until completed.  Lamisil 2 and 50 mg #90 daily -Patient is to return to clinic on a PRN basis.  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 Felecia Shelling, DPM    [address] [place], [place] [address]                Office [phone]  Fax [phone]

## 2023-05-28 ENCOUNTER — Other Ambulatory Visit: Payer: Self-pay | Admitting: Allergy & Immunology

## 2023-05-28 DIAGNOSIS — L299 Pruritus, unspecified: Secondary | ICD-10-CM | POA: Diagnosis not present

## 2023-05-28 DIAGNOSIS — R002 Palpitations: Secondary | ICD-10-CM

## 2023-05-28 NOTE — Progress Notes (Signed)
 I ordered a a hemoglobin A1c per patient request.   Malachi Bonds, MD [facility]

## 2023-05-29 LAB — HEMOGLOBIN A1C
Est. average glucose Bld gHb Est-mCnc: 120 mg/dL
Hgb A1c MFr Bld: 5.8 % — ABNORMAL HIGH (ref 4.8–5.6)

## 2023-05-31 LAB — ALPHA-GAL PANEL
Allergen Lamb IgE: <0.1 kU/L
Beef IgE: <0.1 kU/L
IgE (Immunoglobulin E), Serum: 14 [IU]/mL (ref 6–495)
O215-IgE Alpha-Gal: <0.1 kU/L
Pork IgE: <0.1 kU/L

## 2023-06-01 ENCOUNTER — Encounter (HOSPITAL_BASED_OUTPATIENT_CLINIC_OR_DEPARTMENT_OTHER): Payer: Self-pay | Admitting: Cardiovascular Disease

## 2023-06-01 ENCOUNTER — Ambulatory Visit (HOSPITAL_BASED_OUTPATIENT_CLINIC_OR_DEPARTMENT_OTHER): Payer: Commercial Managed Care - PPO | Admitting: Cardiovascular Disease

## 2023-06-01 VITALS — BP 120/62 | HR 79 | Ht 63.0 in | Wt 256.9 lb

## 2023-06-01 DIAGNOSIS — Z6841 Body Mass Index (BMI) 40.0 and over, adult: Secondary | ICD-10-CM | POA: Diagnosis not present

## 2023-06-01 DIAGNOSIS — G4733 Obstructive sleep apnea (adult) (pediatric): Secondary | ICD-10-CM | POA: Diagnosis not present

## 2023-06-01 DIAGNOSIS — I10 Essential (primary) hypertension: Secondary | ICD-10-CM

## 2023-06-01 NOTE — Patient Instructions (Signed)
 Medication Instructions:  Your physician recommends that you continue on your current medications as directed. Please refer to the Current Medication list given to you today.  Follow-Up: At [facility], you and your health needs are our priority.  As part of our continuing mission to provide you with exceptional heart care, we have created designated Provider Care Teams.  These Care Teams include your primary Cardiologist (physician) and Advanced Practice Providers (APPs -  Physician Assistants and Nurse Practitioners) who all work together to provide you with the care you need, when you need it.  We recommend signing up for the patient portal called "MyChart".  Sign up information is provided on this After Visit Summary.  MyChart is used to connect with patients for Virtual Visits (Telemedicine).  Patients are able to view lab/test results, encounter notes, upcoming appointments, etc.  Non-urgent messages can be sent to your provider as well.   To learn more about what you can do with MyChart, go to [email or web address].    Your next appointment:   1 year with Dr. Duke Salvia

## 2023-06-01 NOTE — Progress Notes (Signed)
 Cardiology Office Note:  .   Date:  06/01/2023  ID:  Claudia Wall, DOB [birth year], MRN [id number] PCP: Jerre Simon, MD  [facility] Providers Cardiologist:  Chilton Si, MD    History of Present Illness: .    Claudia Wall is a 48 y.o. female with hypertension, morbid obesity, GERD, obstructive sleep apnea, PCOS, alcohol abuse, tobacco abuse, anxiety, depression, and PTSD, here for follow-up. She was initially seen 12/2020 for the evaluation of elevated blood pressure and palpitations. She saw Dr. Cherly Hensen 11/2020. At that visit her blood pressure was 132/92 on HCTZ. She presented to Urgent Care 01/11/2021 with episodes of palpitations occurring 5-6 times per week with typical duration of a second. EKG at that time showed normal sinus rhythm. It was recommended she follow-up with her PCP for possible Holter monitor. She then saw Claudia Jumbo, Claudia Wall 01/14/2021 and reported associated shortness of breath, dizziness, and weakness with her palpitations. Claudia Wall requested a Holter monitor and a cardiology referral. At that visit her blood pressure was also elevated at 160/91.   Her palpitations have occurred since August 2019 or 2020 right after receiving a COVID vaccination. At that time she felt dizziness, shortness of breath, and felt "spaced out." Since then, her palpitations have lasted for about one second, and occur sporadically when she is at rest or walking. She had associated dizziness and shortness of breath as well. She is not taking meloxicam because it exacerbates her palpitations. Also, she reported being dx with hypertension when she was 48 yo. She was initially prescribed doxazosin in her early 20's, but she did not take this. A prior sleep study did not reveal sleep apnea. At her last appointment, her blood pressure was elevated so amlodipine was added. She planned to work on diet and exercise. Her renin, aldosterone, and thyroid were all normal. Her PCP ordered a  ambulatory monitor. The 3-day Zio monitor 01/2021 showed sinus rhythm, rare PACs and PVCs, and 13 beats NSVT. Echo revealed LVEF 60-65% and grade 1 diastolic dysfunction. Renal artery dopplers were normal. Sleep study 02/2021 revealed obstructive sleep apnea.    Her palpitations had improved and just occurred after she drank alcohol. Her blood pressure was improving but not at goal so she was started on tribenzor. She was restarted on CPAP. She saw our pharmacist and was started on [facility] 06/20/2021. On 09/25/2021 she called the office and reported blood pressures of 160-189/100-109. Her amlodipine was increased to 10 mg daily. At her appointment 10/2021 she reported palpitations. Metoprolol was added.  She hat not started it at her f/u 11/2021.  She was unable to afford a CPAP machine.  At the visit 03/2022 amlodipine was reduced 2/2 hypotension.  Claudia Wall experiences palpitations once or twice a month, which have become less frequent than before. These episodes are accompanied by a sensation of near syncope, lasting only a few seconds, and occur randomly, whether at rest or during activity. No chest pain, pressure, or breathing difficulties are associated with these episodes.  Her hypertension is currently well-controlled with medications, including olmesartan, spironolactone, amlodipine, and metoprolol. She does not frequently monitor her blood pressure at home, but it has been stable during office visits.  She experiences leg swelling, particularly after prolonged standing at work, which improves by morning. She does not use compression socks but is on her feet a lot at work.  She mentions difficulty with weight management and a lack of regular exercise, although she is active at work. She  has not been engaging in planned physical activities.     ROS:  As per HPI  Studies Reviewed: .       3 Day Zio Monitor 01/2021:   Quality: Fair.  Baseline artifact. Predominant rhythm: Sinus rhythm Average  heart rate: 88 bpm Max heart rate: 156 bpm Min heart rate: 55 bpm Pauses >2.5 seconds: None   13 beats NSVT <1% PACs and PVCs Patient triggered events corresponded to PVCs,  sinus rhythm, and sinus tachycardia  Risk Assessment/Calculations:             Physical Exam:   VS:  BP 120/62   Pulse 79   Ht 5\' 3"  (1.6 m)   Wt 256 lb 14.4 oz (116.5 kg)   LMP  (LMP Unknown) Comment: PARTIAL HYSTERECTOMY  SpO2 99%   BMI 45.51 kg/m  , BMI Body mass index is 45.51 kg/m. GENERAL:  Well appearing HEENT: Pupils equal round and reactive, fundi not visualized, oral mucosa unremarkable NECK:  No jugular venous distention, waveform within normal limits, carotid upstroke brisk and symmetric, no bruits, no thyromegaly LUNGS:  Clear to auscultation bilaterally HEART:  RRR.  PMI not displaced or sustained,S1 and S2 within normal limits, no S3, no S4, no clicks, no rubs, no murmurs ABD:  Flat, positive bowel sounds normal in frequency in pitch, no bruits, no rebound, no guarding, no midline pulsatile mass, no hepatomegaly, no splenomegaly EXT:  2 plus pulses throughout, no edema, no cyanosis no clubbing SKIN:  No rashes no nodules NEURO:  Cranial nerves II through XII grossly intact, motor grossly intact throughout PSYCH:  Cognitively intact, oriented to person place and time   ASSESSMENT AND PLAN: .    # Palpitations # NSVT:  Palpitations occur once or twice a month, sometimes with near-syncope, but are less frequent. No chest pain or pressure. Blood pressure is stable.   OK to take extra metoprolol as needed.  She has no structural heart disease on echo.   # Diastolic dysfunction Grade 1 diastolic dysfunction likely due to hypertension and weight. Normal ejection fraction (60-65%) on 2022 echocardiogram. Explained hypertension's role in myocardial hypertrophy. - Encourage regular exercise and weight loss to improve diastolic function and control blood pressure.  # Hypertension Blood pressure  well-controlled with olmesartan, spironolactone, amlodipine, and metoprolol. Emphasized exercise and weight loss for further management and potential medication reduction. - Continue olmesartan, spironolactone, amlodipine, and metoprolol. - Encourage regular exercise and weight loss to aid in blood pressure management.  # Venous insufficiency Leg swelling likely due to venous insufficiency from prolonged standing, not heart failure. - Recommend wearing compression socks, especially at work, to reduce leg swelling.  # Obstructive sleep apnea (OSA) Has not obtained a CPAP machine. Due to cost prohibitive.      Dispo: f/u in 1 year  Signed, Chilton Si, MD

## 2023-06-03 ENCOUNTER — Encounter: Payer: Self-pay | Admitting: Allergy & Immunology

## 2023-06-05 LAB — ALLERGENS W/COMP RFLX AREA 2
Alternaria Alternata IgE: <0.1 kU/L
Aspergillus Fumigatus IgE: <0.1 kU/L
Bermuda Grass IgE: <0.1 kU/L
Cedar, Mountain IgE: <0.1 kU/L
Cladosporium Herbarum IgE: <0.1 kU/L
Cockroach, German IgE: <0.1 kU/L
Common Silver Birch IgE: <0.1 kU/L
Cottonwood IgE: <0.1 kU/L
D Farinae IgE: <0.1 kU/L
D Pteronyssinus IgE: <0.1 kU/L
E001-IgE Cat Dander: <0.1 kU/L
E005-IgE Dog Dander: <0.1 kU/L
Elm, American IgE: <0.1 kU/L
IgE (Immunoglobulin E), Serum: 14 [IU]/mL (ref 6–495)
Johnson Grass IgE: <0.1 kU/L
Maple/Box Elder IgE: <0.1 kU/L
Mouse Urine IgE: <0.1 kU/L
Oak, White IgE: <0.1 kU/L
Pecan, Hickory IgE: <0.1 kU/L
Penicillium Chrysogen IgE: <0.1 kU/L
Pigweed, Rough IgE: <0.1 kU/L
Ragweed, Short IgE: <0.1 kU/L
Sheep Sorrel IgE Qn: <0.1 kU/L
Timothy Grass IgE: <0.1 kU/L
White Mulberry IgE: <0.1 kU/L

## 2023-06-05 LAB — ANTINUCLEAR ANTIBODIES, IFA: ANA Titer 1: NEGATIVE

## 2023-06-05 LAB — THYROID ANTIBODIES (THYROPEROXIDASE & THYROGLOBULIN)
Thyroglobulin Antibody: <1 [IU]/mL (ref 0.0–0.9)
Thyroperoxidase Ab SerPl-aCnc: <9 [IU]/mL (ref 0–34)

## 2023-06-05 LAB — SEDIMENTATION RATE: Sed Rate: 37 mm/h — ABNORMAL HIGH (ref 0–32)

## 2023-06-05 LAB — C-REACTIVE PROTEIN: CRP: 3 mg/L (ref 0–10)

## 2023-06-05 LAB — CHRONIC URTICARIA: cu index: <1.9 (ref <10)

## 2023-06-05 LAB — TRYPTASE: Tryptase: 4.9 ug/L (ref 2.2–13.2)

## 2023-06-08 ENCOUNTER — Other Ambulatory Visit (HOSPITAL_COMMUNITY): Payer: Self-pay

## 2023-06-11 ENCOUNTER — Other Ambulatory Visit (HOSPITAL_COMMUNITY): Payer: Self-pay

## 2023-06-11 MED ORDER — PIMECROLIMUS 1 % EX CREA
TOPICAL_CREAM | Freq: Two times a day (BID) | CUTANEOUS | 5 refills | Status: DC
Start: 2023-06-11 — End: 2023-11-20
  Filled 2023-06-11: qty 30, 15d supply, fill #0
  Filled 2023-09-15: qty 30, 15d supply, fill #1

## 2023-06-11 MED ORDER — TRIAMCINOLONE ACETONIDE 0.1 % EX OINT
1.0000 | TOPICAL_OINTMENT | Freq: Two times a day (BID) | CUTANEOUS | 0 refills | Status: AC | PRN
Start: 2023-06-11 — End: ?
  Filled 2023-06-11: qty 454, 30d supply, fill #0

## 2023-06-12 ENCOUNTER — Other Ambulatory Visit (HOSPITAL_COMMUNITY): Payer: Self-pay

## 2023-06-16 ENCOUNTER — Encounter: Payer: Self-pay | Admitting: Allergy & Immunology

## 2023-07-01 ENCOUNTER — Other Ambulatory Visit: Payer: Self-pay

## 2023-07-01 ENCOUNTER — Encounter (HOSPITAL_COMMUNITY): Payer: Self-pay | Admitting: *Deleted

## 2023-07-01 ENCOUNTER — Ambulatory Visit (HOSPITAL_COMMUNITY)
Admission: EM | Admit: 2023-07-01 | Discharge: 2023-07-01 | Disposition: A | Discharge status: Home / Self Care | Attending: Family Medicine | Admitting: Family Medicine

## 2023-07-01 DIAGNOSIS — R002 Palpitations: Secondary | ICD-10-CM | POA: Diagnosis not present

## 2023-07-01 LAB — TSH: TSH: 1.816 u[IU]/mL (ref 0.350–4.500)

## 2023-07-01 LAB — COMPREHENSIVE METABOLIC PANEL WITH GFR
ALT: 22 U/L (ref 0–44)
AST: 21 U/L (ref 15–41)
Albumin: 4.2 g/dL (ref 3.5–5.0)
Alkaline Phosphatase: 56 U/L (ref 38–126)
Anion gap: 8 (ref 5–15)
BUN: 11 mg/dL (ref 6–20)
CO2: 24 mmol/L (ref 22–32)
Calcium: 9.6 mg/dL (ref 8.9–10.3)
Chloride: 105 mmol/L (ref 98–111)
Creatinine, Ser: 1.06 mg/dL — ABNORMAL HIGH (ref 0.44–1.00)
GFR, Estimated: >60 mL/min (ref >60)
Glucose, Bld: 100 mg/dL — ABNORMAL HIGH (ref 70–99)
Potassium: 4 mmol/L (ref 3.5–5.1)
Sodium: 137 mmol/L (ref 135–145)
Total Bilirubin: 0.6 mg/dL (ref 0.0–1.2)
Total Protein: 7.7 g/dL (ref 6.5–8.1)

## 2023-07-01 LAB — CBC
HCT: 38.2 % (ref 36.0–46.0)
Hemoglobin: 13 g/dL (ref 12.0–15.0)
MCH: 30.7 pg (ref 26.0–34.0)
MCHC: 34 g/dL (ref 30.0–36.0)
MCV: 90.3 fL (ref 80.0–100.0)
Platelets: 364 10*3/uL (ref 150–400)
RBC: 4.23 MIL/uL (ref 3.87–5.11)
RDW: 12.1 % (ref 11.5–15.5)
WBC: 7.6 10*3/uL (ref 4.0–10.5)
nRBC: 0 % (ref 0.0–0.2)

## 2023-07-01 NOTE — ED Triage Notes (Signed)
 PT reports she was driving home after work and became short of breath. Pt felt her heart flutter and felt high. Pt denies any new meds.

## 2023-07-01 NOTE — Discharge Instructions (Addendum)
 You have had labs (blood tests) sent today. We will call you with any significant abnormalities or if there is need to begin or change treatment or pursue further follow up.  You may also review your test results online through MyChart. If you do not have a MyChart account, instructions to sign up should be on your discharge paperwork.

## 2023-07-02 ENCOUNTER — Telehealth (HOSPITAL_BASED_OUTPATIENT_CLINIC_OR_DEPARTMENT_OTHER): Payer: Self-pay | Admitting: Cardiovascular Disease

## 2023-07-02 NOTE — Telephone Encounter (Signed)
 Called and left detailed message for pt.

## 2023-07-02 NOTE — Telephone Encounter (Signed)
 Called and spoke to patient. Patient verified with 2 identifiers (DOB and Last Name)  Patient's concerns: Patient states she felt her heart flutter for about 4-5 seconds yesterday while driving--during she found it to be hard to breath / SOB. She also felt lost and confused. Pt's voice Does not know what triggered episode. She went to urgent care and tests were fine, EKG was normal. VS: 137/82 - 64 - 96% RA. Does not have Hx asthma and does not have allergies. Denies feeling sick. Does not feel a BP log.  Nurse's Recommendations: Recommended reaching out to PCP. Will route to APP for any further recommendations. Also advised to take BP 1-2 hours after taking medication and to be at rest for at least 5-10 mins before taking BP. OK to record BP on paper and sent through [facility] if BP remains elevated, along with symptoms felt at time of BP.  Patient verbalized understanding.

## 2023-07-02 NOTE — Telephone Encounter (Signed)
 Prior monitor and echocardiogram reassuring. Urgent care workup reassuring. Typical triggers for palpitations include dehydration, stress, alcohol. Ensure staying well hydrated, managing stress as best she can. If she has recurrent palpitations, as previously discussed with Dr. Duke Salvia can take extra 0.5-1 tablet of her Metoprolol. Agree with recommendation to monitor BP and monitor symptoms. If recur, would recommend PCP eval.   Alver Sorrow, NP

## 2023-07-02 NOTE — Telephone Encounter (Signed)
 Patient c/o Palpitations:  STAT if patient reporting lightheadedness, shortness of breath, or chest pain  How long have you had palpitations/irregular HR/ Afib? Are you having the symptoms now?   No  Are you currently experiencing lightheadedness, SOB or CP?   No  Do you have a history of afib (atrial fibrillation) or irregular heart rhythm?   Yes  Have you checked your BP or HR? (document readings if available):   No  Are you experiencing any other symptoms?  Hoarse voice, a little SOB, disoriented/confused  Patient stated yesterday she had palpitations which lasted 4-5 seconds.  Patient stated she went to Urgent Care and everything checked out normal.

## 2023-07-04 NOTE — ED Provider Notes (Signed)
 [facility]   [id number] 07/01/23 Arrival Time: 1857  ASSESSMENT & PLAN:  1. Palpitations    ECG: NSR. No STEMI.  Results for orders placed or performed during the hospital encounter of 07/01/23  CBC   Collection Time: 07/01/23  8:03 PM  Result Value Ref Range   WBC 7.6 4.0 - 10.5 K/uL   RBC 4.23 3.87 - 5.11 MIL/uL   Hemoglobin 13.0 12.0 - 15.0 g/dL   HCT 40.1 02.7 - 25.3 %   MCV 90.3 80.0 - 100.0 fL   MCH 30.7 26.0 - 34.0 pg   MCHC 34.0 30.0 - 36.0 g/dL   RDW 66.4 40.3 - 47.4 %   Platelets 364 150 - 400 K/uL   nRBC 0.0 0.0 - 0.2 %  Comprehensive metabolic panel with GFR   Collection Time: 07/01/23  8:03 PM  Result Value Ref Range   Sodium 137 135 - 145 mmol/L   Potassium 4.0 3.5 - 5.1 mmol/L   Chloride 105 98 - 111 mmol/L   CO2 24 22 - 32 mmol/L   Glucose, Bld 100 (H) 70 - 99 mg/dL   BUN 11 6 - 20 mg/dL   Creatinine, Ser 2.59 (H) 0.44 - 1.00 mg/dL   Calcium 9.6 8.9 - 56.3 mg/dL   Total Protein 7.7 6.5 - 8.1 g/dL   Albumin 4.2 3.5 - 5.0 g/dL   AST 21 15 - 41 U/L   ALT 22 0 - 44 U/L   Alkaline Phosphatase 56 38 - 126 U/L   Total Bilirubin 0.6 0.0 - 1.2 mg/dL   GFR, Estimated >87 >56 mL/min   Anion gap 8 5 - 15  TSH   Collection Time: 07/01/23  8:03 PM  Result Value Ref Range   TSH 1.816 0.350 - 4.500 uIU/mL   Cr just slightly elevated. Reports distant h/o palpitations before. Currently asymptomatic. Avoid caffeine.  Recommend:  Follow-up Information     [facility] at [facility].   Specialty: Emergency Medicine Why: If symptoms worsen in any way. Contact information: [address] [place] [address] [phone]        Schedule an appointment as soon as possible for a visit  with Jerre Simon, MD.   Specialty: Family Medicine Why: For follow up. Contact information: [address] [place] [address] [phone]                 Reviewed expectations re: course of  current medical issues. Questions answered. Outlined signs and symptoms indicating need for more acute intervention. Understanding verbalized. After Visit Summary given.   SUBJECTIVE: History from: Patient. Claudia Wall is a 48 y.o. female. Pt reports single episode of palpitations while driving; today; last 3-4 seconds then resolved. Distant h/o same. No prev evaluation. Denies current CP/SOB. Associated symptoms during episode: felt SOB and was feeling lightheaded; these resolved. Ambulatory here. Denies recreational drug use. Social History   Tobacco Use  Smoking Status Former   Current packs/day: 0.00   Average packs/day: 0.3 packs/day for 25.0 years (6.3 ttl pk-yrs)   Types: Cigarettes   Start date: 40   Quit date: 2022   Years since quitting: 3.2   Passive exposure: Past  Smokeless Tobacco Never  Tobacco Comments   "used to smoke 1/2 to 1ppd depending on stress"   Social History   Substance and Sexual Activity  Alcohol Use Yes   Alcohol/week: 3.0 standard drinks of alcohol   Types: 3 Glasses of  wine per week   Comment: 2-3 times a week   Normal PO intake without n/v/d.  OBJECTIVE:  Vitals:   07/01/23 1934  BP: 137/82  Pulse: 63  Resp: 18  Temp: 98.4 F (36.9 C)  SpO2: 99%    General appearance: alert; no distress Eyes: PERRLA; EOMI; conjunctiva normal HENT: [place]; AT; without nasal congestion Neck: supple  CV: RRR Lungs: speaks full sentences without difficulty; unlabored; CTAB Extremities: no edema Skin: warm and dry Neurologic: normal gait Psychological: alert and cooperative; normal mood and affect  Labs:  Labs Reviewed  COMPREHENSIVE METABOLIC PANEL WITH GFR - Abnormal; Notable for the following components:      Result Value   Glucose, Bld 100 (*)    Creatinine, Ser 1.06 (*)    All other components within normal limits  CBC  TSH    Imaging: No results found.  Allergies  Allergen Reactions   Tramadol Itching   Hydrocodone      itching   Oxycodone Itching   Tylenol [Acetaminophen]     itching    Past Medical History:  Diagnosis Date   Alcohol abuse    Hx, occ on weekends   Anxiety    Constipation    Depression    GERD (gastroesophageal reflux disease)    HTN (hypertension)    Irregular periods/menstrual cycles    Joint pain    Lactose intolerance    Leg edema    goes away when props legs up per pt   lower Back pain    Mastitis    h/o right   Obesity    PCOS (polycystic ovarian syndrome)    Pre-diabetes    PTSD (post-traumatic stress disorder)    Sleep apnea    no cpap used due to cost per pt   Tobacco abuse    Stopped 2021   Uterine fibroid    Social History   Socioeconomic History   Marital status: Single    Spouse name: Catering manager   Number of children: Not on file   Years of education: Not on file   Highest education level: Not on file  Occupational History   Not on file  Tobacco Use   Smoking status: Former    Current packs/day: 0.00    Average packs/day: 0.3 packs/day for 25.0 years (6.3 ttl pk-yrs)    Types: Cigarettes    Start date: 39    Quit date: 2022    Years since quitting: 3.2    Passive exposure: Past   Smokeless tobacco: Never   Tobacco comments:    "used to smoke 1/2 to 1ppd depending on stress"  Vaping Use   Vaping status: Never Used  Substance and Sexual Activity   Alcohol use: Yes    Alcohol/week: 3.0 standard drinks of alcohol    Types: 3 Glasses of wine per week    Comment: 2-3 times a week   Drug use: Not Currently   Sexual activity: Yes    Partners: Female    Birth control/protection: Other-see comments, None    Comment: for past 21 years  Other Topics Concern   Not on file  Social History Narrative   Is in a same sex relationship x 1 year. Very supportive and loving per pt and partner.    Social Drivers of Corporate investment banker Strain: Low Risk  (09/05/2022)   Received from [facility]   Overall Financial Resource  Strain (CARDIA)    Difficulty of Paying  Living Expenses: Not hard at all  Food Insecurity: No Food Insecurity (09/05/2022)   Received from [facility], [facility]   Hunger Vital Sign    Worried About Running Out of Food in the Last Year: Never true    Ran Out of Food in the Last Year: Never true  Transportation Needs: No Transportation Needs (09/05/2022)   Received from [facility], [facility]   [facility] - Transportation    Lack of Transportation (Medical): No    Lack of Transportation (Non-Medical): No  Physical Activity: Not on file  Stress: Not on file  Social Connections: Unknown (08/02/2021)   Received from [facility], [facility]   Social Network    Social Network: Not on file  Intimate Partner Violence: Unknown (07/05/2021)   Received from [facility], [facility]   HITS    Physically Hurt: Not on file    Insult or Talk Down To: Not on file    Threaten Physical Harm: Not on file    Scream or Curse: Not on file   Family History  Problem Relation Age of Onset   Cancer Mother    Hypertension Mother    Heart disease Mother    Depression Mother    Bipolar disorder Mother    Schizophrenia Mother    Alcoholism Mother    Drug abuse Mother    Obesity Mother    Hypertension Father    Diabetes Father    Heart attack Maternal Aunt    Heart failure Maternal Grandmother    Past Surgical History:  Procedure Laterality Date   CHOLECYSTECTOMY  03/31/2000   INCISION AND DRAINAGE BREAST ABSCESS     yrs ago   IR RADIOLOGIST EVAL & MGMT  04/03/2021   IR RADIOLOGIST EVAL & MGMT  04/26/2021   left carpal tunnel release surgery  05/2018   ROBOTIC ASSISTED LAPAROSCOPIC HYSTERECTOMY AND SALPINGECTOMY Bilateral 09/27/2021   Procedure: XI ROBOTIC ASSISTED LAPAROSCOPIC HYSTERECTOMY AND SALPINGECTOMY;  Surgeon: Shea Evans, MD;  Location: [facility];  Service: Gynecology;  Laterality: Bilateral;     Mardella Layman, MD 07/04/23 1251

## 2023-07-09 ENCOUNTER — Ambulatory Visit: Admitting: Allergy & Immunology

## 2023-07-09 DIAGNOSIS — L299 Pruritus, unspecified: Secondary | ICD-10-CM | POA: Diagnosis not present

## 2023-07-09 DIAGNOSIS — L281 Prurigo nodularis: Secondary | ICD-10-CM

## 2023-07-09 DIAGNOSIS — J310 Chronic rhinitis: Secondary | ICD-10-CM | POA: Diagnosis not present

## 2023-07-09 NOTE — Patient Instructions (Signed)
 Pruritus:  - Likely due to dry skin. No rashes. - labwork previously normal: 05/2022: normal TSH, 10/2022: A1c with prediabetes, 11/2022: normal renal/liver function and blood counts  - SPT 01/2023: negative to aeroallergens  - Do a daily soaking tub bath in warm water for 10-15 minutes.  - Use a gentle, unscented cleanser at the end of the bath (such as Dove unscented bar or baby wash, or Aveeno sensitive body wash). Then rinse, pat half-way dry, and apply a gentle, unscented moisturizer cream or ointment (Cerave, Cetaphil, Eucerin, Aveeno, Vanicream, Vaseline)  all over while still damp. Dry skin makes the itching and rash of eczema worse. The skin should be moisturized with a gentle, unscented moisturizer at least twice daily.  - Use only unscented liquid laundry detergent.

## 2023-07-09 NOTE — Progress Notes (Unsigned)
 FOLLOW UP  Date of Service/Encounter:  07/09/23   Assessment:   Pruritus  Non-allergic rhinitis  Prurigo nodularis  Plan/Recommendations:   Patient Instructions  Pruritus:  - Likely due to dry skin. No rashes. - labwork previously normal: 05/2022: normal TSH, 10/2022: A1c with prediabetes, 11/2022: normal renal/liver function and blood counts  - SPT 01/2023: negative to aeroallergens  - Do a daily soaking tub bath in warm water for 10-15 minutes.  - Use a gentle, unscented cleanser at the end of the bath (such as Dove unscented bar or baby wash, or Aveeno sensitive body wash). Then rinse, pat half-way dry, and apply a gentle, unscented moisturizer cream or ointment (Cerave, Cetaphil, Eucerin, Aveeno, Vanicream, Vaseline)  all over while still damp. Dry skin makes the itching and rash of eczema worse. The skin should be moisturized with a gentle, unscented moisturizer at least twice daily.  - Use only unscented liquid laundry detergent.   Subjective:   Claudia Wall is a 48 y.o. female presenting today for follow up of No chief complaint on file.   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   Anxiety 10/20/2020   Cervical dysplasia 06/09/2019   Uterine polyp 04/20/2019   Chronic bilateral low back pain without sciatica 09/23/2018   Obesity, Class III, BMI 40-49.9 (morbid obesity) (HCC) 09/23/2018   Pelvic pain 07/18/2018   Bilateral carpal tunnel syndrome 02/09/2018   Acute lumbar myofascial strain 12/29/2017   Chronic post-traumatic stress disorder (PTSD) 12/29/2017   Morbid obesity (HCC) 11/13/2017   Insomnia due to stress 11/09/2017   Chronic toe pain, left foot 11/09/2017    Other hyperlipidemia 05/13/2017   Vitamin D deficiency 05/13/2017   Prediabetes 05/13/2017   Enlarged thyroid 04/29/2017   PCOS (polycystic ovarian syndrome) 04/29/2017   Irregular periods/menstrual cycles 02/12/2017   Vaginal dryness 02/12/2017   Left carpal tunnel syndrome 07/08/2016   Carpal tunnel syndrome, right upper limb 07/07/2013   Palpitations 10/27/2011   Dyshidrotic eczema 11/06/2008   Tobacco use disorder 11/01/2008   BMI 45.0-49.9, adult (HCC) 02/03/2007   Essential hypertension 05/28/2006   GERD (gastroesophageal reflux disease) 05/28/2006    History obtained from: chart review and patient.  Discussed the use of AI scribe software for clinical note transcription with the patient and/or guardian, who gave verbal consent to proceed.  Claudia Wall is a 48 y.o. female presenting for a follow up visit.  She was last seen in November 2020 for by Dr. Allena Katz.  At that time, she presented with pruritus.  She also had some chronic rhinitis.  Skin testing was negative to the head outdoor allergens as well as most common foods.  Dr. Allena Katz felt that her itching was likely from dry skin.  There are no distinct rashes appreciated at the time.  She was encouraged to use moisturizing religiously and change to unscented liquid laundry detergent.  In the interim, she contacted me and was wondering about additional lab work.  I obtain labs including a zone 2 environmental allergy panel, thyroid antibodies, tryptase, inflammatory markers, chronic urticaria panel, ANA, and an alpha gal panel.  These were all normal aside from a very mildly elevated ESR.  We put her on suppressive doses of  antihistamines.  She was already on hydrocortisone.  I added on triamcinolone as well as pimecrolimus to see if this would help with her symptoms. When when I first saw her. She did have some intense itching when I saw her.  She also had chronic areas of hyperkeratinization consistent with prurigo nodularis noted  especially in her bilateral arms.  I do not really appreciate much in the way of eczema, but she did have some excoriations and lesions in her antecubital fossa.  Overall, her skin was extremely ichthyotic.  In the interim, she has continued to use her triamcinolone and pimecrolimus.  She does this at least once a day.  It does not seem to be extremely effective.  She has been trying to moisturize more routinely.  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  Itching in the skin changes involve her both of her arms as well as her legs.  Otherwise, there have been no changes to her past medical history, surgical history, family history, or social history.    Review of systems otherwise negative other than that mentioned in the HPI.    Objective:   Vitals reviewed and all within normal limits.    Physical Exam Constitutional:      Appearance: She is well-developed.  HENT:     Head: Normocephalic and atraumatic.     Right Ear: Tympanic membrane, ear canal and external ear normal.     Left Ear: Tympanic membrane, ear canal and external ear normal.     Nose: No nasal deformity, septal deviation, mucosal edema or rhinorrhea.     Right Turbinates: Not enlarged or swollen.     Left Turbinates: Not enlarged or swollen.     Right Sinus: No maxillary sinus tenderness or frontal sinus tenderness.     Left Sinus: No maxillary sinus tenderness or frontal sinus tenderness.     Mouth/Throat:     Mouth: Mucous membranes are not pale and not dry.     Pharynx: Uvula midline.  Eyes:     General: Lids are normal. No allergic shiner.       Right eye: No discharge.        Left eye: No discharge.     Conjunctiva/sclera: Conjunctivae normal.     Right eye: Right conjunctiva is not injected. No chemosis.    Left eye: Left conjunctiva is not injected. No chemosis.    Pupils: Pupils are equal, round, and reactive to light.  Cardiovascular:     Rate and Rhythm: Normal rate  and regular rhythm.     Heart sounds: Normal heart sounds.  Pulmonary:     Effort: Pulmonary effort is normal. No tachypnea, accessory muscle usage or respiratory distress.     Breath sounds: Normal breath sounds. No wheezing, rhonchi or rales.  Chest:     Chest wall: No tenderness.  Lymphadenopathy:     Cervical: No cervical adenopathy.  Skin:    General: Skin is warm.     Capillary Refill: Capillary refill takes less than 2 seconds.     Coloration: Skin is ashen. Skin is not pale.     Findings: No abrasion, erythema, petechiae or rash. Rash is not papular, urticarial or vesicular.     Comments: Ichthyotic skin over bilateral arms as well as her neck and legs.  She has hyperkeratinization of her arms with multiple areas that appear consistent with prurigo nodularis.  She does have eczematous lesions in her bilateral antecubital fossa.  Excoriations are present.  Neurological:     Mental Status: She is alert.  Psychiatric:        Behavior: Behavior is cooperative.      Diagnostic studies: none     Malachi Bonds, MD  [facility]

## 2023-07-10 ENCOUNTER — Encounter: Payer: Self-pay | Admitting: Allergy & Immunology

## 2023-07-10 DIAGNOSIS — L281 Prurigo nodularis: Secondary | ICD-10-CM | POA: Insufficient documentation

## 2023-07-10 DIAGNOSIS — L299 Pruritus, unspecified: Secondary | ICD-10-CM | POA: Insufficient documentation

## 2023-07-10 DIAGNOSIS — J310 Chronic rhinitis: Secondary | ICD-10-CM | POA: Insufficient documentation

## 2023-07-11 DIAGNOSIS — F4323 Adjustment disorder with mixed anxiety and depressed mood: Secondary | ICD-10-CM | POA: Diagnosis not present

## 2023-07-14 ENCOUNTER — Other Ambulatory Visit (HOSPITAL_BASED_OUTPATIENT_CLINIC_OR_DEPARTMENT_OTHER): Payer: Self-pay | Admitting: Family

## 2023-07-14 ENCOUNTER — Other Ambulatory Visit (HOSPITAL_COMMUNITY): Payer: Self-pay

## 2023-07-15 ENCOUNTER — Other Ambulatory Visit (HOSPITAL_COMMUNITY): Payer: Self-pay

## 2023-07-15 MED ORDER — OLMESARTAN MEDOXOMIL 40 MG PO TABS
40.0000 mg | ORAL_TABLET | Freq: Every day | ORAL | 3 refills | Status: DC
  Filled 2023-07-15: qty 90, 90d supply, fill #0
  Filled 2023-10-22: qty 90, 90d supply, fill #1
  Filled 2024-01-18: qty 90, 90d supply, fill #2

## 2023-07-16 ENCOUNTER — Telehealth: Payer: Self-pay | Admitting: *Deleted

## 2023-07-16 NOTE — Telephone Encounter (Signed)
-----   Message from Rochester Chuck sent at 07/10/2023 10:28 PM EDT ----- Nemluvio!

## 2023-07-16 NOTE — Telephone Encounter (Signed)
 Patient Ins denied Nemluvio has to try and fail all of the following Dupixent, Rinvoq, Adbry

## 2023-07-18 DIAGNOSIS — F4323 Adjustment disorder with mixed anxiety and depressed mood: Secondary | ICD-10-CM | POA: Diagnosis not present

## 2023-07-20 NOTE — Telephone Encounter (Signed)
 Dang. I thought the rep said that we could get anyone on the drug - even if it is not on the formulary. Is that wrong?

## 2023-07-21 ENCOUNTER — Other Ambulatory Visit (HOSPITAL_COMMUNITY): Payer: Self-pay

## 2023-07-22 DIAGNOSIS — F4323 Adjustment disorder with mixed anxiety and depressed mood: Secondary | ICD-10-CM | POA: Diagnosis not present

## 2023-07-24 NOTE — Telephone Encounter (Signed)
 Can we submit the application to [employer] to get it covered?   Drexel Gentles, MD [facility] 

## 2023-07-28 NOTE — Telephone Encounter (Signed)
 Right now I can't even fax anything to their HUB. I have been trying to fax another patient for over a week and will not go through and I have reached out to their [facility] and no other fax number as backup. Can't even get a copay card for patient right now

## 2023-07-29 DIAGNOSIS — F4323 Adjustment disorder with mixed anxiety and depressed mood: Secondary | ICD-10-CM | POA: Diagnosis not present

## 2023-07-30 ENCOUNTER — Other Ambulatory Visit: Payer: Self-pay

## 2023-07-30 ENCOUNTER — Encounter (HOSPITAL_BASED_OUTPATIENT_CLINIC_OR_DEPARTMENT_OTHER): Payer: Self-pay

## 2023-07-30 ENCOUNTER — Emergency Department (HOSPITAL_BASED_OUTPATIENT_CLINIC_OR_DEPARTMENT_OTHER)
Admission: EM | Admit: 2023-07-30 | Discharge: 2023-07-30 | Disposition: A | Discharge status: Home / Self Care | Attending: Emergency Medicine | Admitting: Emergency Medicine

## 2023-07-30 ENCOUNTER — Emergency Department (HOSPITAL_BASED_OUTPATIENT_CLINIC_OR_DEPARTMENT_OTHER)

## 2023-07-30 DIAGNOSIS — I10 Essential (primary) hypertension: Secondary | ICD-10-CM | POA: Insufficient documentation

## 2023-07-30 DIAGNOSIS — Z90710 Acquired absence of both cervix and uterus: Secondary | ICD-10-CM | POA: Diagnosis not present

## 2023-07-30 DIAGNOSIS — R195 Other fecal abnormalities: Secondary | ICD-10-CM | POA: Insufficient documentation

## 2023-07-30 DIAGNOSIS — R197 Diarrhea, unspecified: Secondary | ICD-10-CM | POA: Diagnosis not present

## 2023-07-30 DIAGNOSIS — R110 Nausea: Secondary | ICD-10-CM | POA: Insufficient documentation

## 2023-07-30 DIAGNOSIS — R82998 Other abnormal findings in urine: Secondary | ICD-10-CM | POA: Diagnosis not present

## 2023-07-30 DIAGNOSIS — R1030 Lower abdominal pain, unspecified: Secondary | ICD-10-CM | POA: Diagnosis not present

## 2023-07-30 DIAGNOSIS — Z79899 Other long term (current) drug therapy: Secondary | ICD-10-CM | POA: Diagnosis not present

## 2023-07-30 DIAGNOSIS — R319 Hematuria, unspecified: Secondary | ICD-10-CM | POA: Diagnosis not present

## 2023-07-30 DIAGNOSIS — R1084 Generalized abdominal pain: Secondary | ICD-10-CM | POA: Diagnosis not present

## 2023-07-30 LAB — URINALYSIS, ROUTINE W REFLEX MICROSCOPIC
Bilirubin Urine: NEGATIVE
Glucose, UA: NEGATIVE mg/dL
Hgb urine dipstick: NEGATIVE
Ketones, ur: NEGATIVE mg/dL
Leukocytes,Ua: NEGATIVE
Nitrite: NEGATIVE
Protein, ur: NEGATIVE mg/dL
Specific Gravity, Urine: 1.008 (ref 1.005–1.030)
pH: 5.5 (ref 5.0–8.0)

## 2023-07-30 LAB — CBC WITH DIFFERENTIAL/PLATELET
Abs Immature Granulocytes: 0.01 10*3/uL (ref 0.00–0.07)
Basophils Absolute: 0 10*3/uL (ref 0.0–0.1)
Basophils Relative: 0 %
Eosinophils Absolute: 0.1 10*3/uL (ref 0.0–0.5)
Eosinophils Relative: 1 %
HCT: 36.2 % (ref 36.0–46.0)
Hemoglobin: 12.4 g/dL (ref 12.0–15.0)
Immature Granulocytes: 0 %
Lymphocytes Relative: 50 %
Lymphs Abs: 4.2 10*3/uL — ABNORMAL HIGH (ref 0.7–4.0)
MCH: 31 pg (ref 26.0–34.0)
MCHC: 34.3 g/dL (ref 30.0–36.0)
MCV: 90.5 fL (ref 80.0–100.0)
Monocytes Absolute: 0.7 10*3/uL (ref 0.1–1.0)
Monocytes Relative: 8 %
Neutro Abs: 3.4 10*3/uL (ref 1.7–7.7)
Neutrophils Relative %: 41 %
Platelets: 354 10*3/uL (ref 150–400)
RBC: 4 MIL/uL (ref 3.87–5.11)
RDW: 11.9 % (ref 11.5–15.5)
WBC: 8.4 10*3/uL (ref 4.0–10.5)
nRBC: 0 % (ref 0.0–0.2)

## 2023-07-30 LAB — COMPREHENSIVE METABOLIC PANEL WITH GFR
ALT: 20 U/L (ref 0–44)
AST: 22 U/L (ref 15–41)
Albumin: 4.4 g/dL (ref 3.5–5.0)
Alkaline Phosphatase: 78 U/L (ref 38–126)
Anion gap: 11 (ref 5–15)
BUN: 13 mg/dL (ref 6–20)
CO2: 23 mmol/L (ref 22–32)
Calcium: 9.7 mg/dL (ref 8.9–10.3)
Chloride: 100 mmol/L (ref 98–111)
Creatinine, Ser: 1.05 mg/dL — ABNORMAL HIGH (ref 0.44–1.00)
GFR, Estimated: >60 mL/min (ref >60)
Glucose, Bld: 126 mg/dL — ABNORMAL HIGH (ref 70–99)
Potassium: 3.8 mmol/L (ref 3.5–5.1)
Sodium: 135 mmol/L (ref 135–145)
Total Bilirubin: 0.2 mg/dL (ref 0.0–1.2)
Total Protein: 7.7 g/dL (ref 6.5–8.1)

## 2023-07-30 LAB — OCCULT BLOOD X 1 CARD TO LAB, STOOL: Fecal Occult Bld: NEGATIVE

## 2023-07-30 LAB — CK: Total CK: 125 U/L (ref 38–234)

## 2023-07-30 NOTE — ED Provider Notes (Signed)
 [name] Provider Note   CSN: [id number] Arrival date & time: 07/30/23  1732     History  Chief Complaint  Patient presents with   Hematuria    Claudia Wall is a 48 y.o. female.  HPI       48 year old female with a history of hypertension, PCOS, OSA, who presents with concern for possible hematuria, hematochezia, abdominal pain.  Reports that she has been having loose stool or diarrhea for the past 3 weeks 3 times a day.  Yesterday when she urinated her urine looked dark between a red and orange color.  When she urinated again this morning had a similar appearance.  After she urinated this morning she developed a cramping sort of lower abdominal pain.  Reports is present bilaterally and describes it as a moderate discomfort.  She also notes she had a stool that was red in color.  She has been having some beets in her salad.  She has had some mild nausea.  She denies vomiting, fevers, cough.  She has a history of hysterectomy and has not had any vaginal bleeding.  She is not sure if what she is seeing in her urine or her stool is blood.  Past Medical History:  Diagnosis Date   Alcohol abuse    Hx, occ on weekends   Anxiety    Constipation    Depression    GERD (gastroesophageal reflux disease)    HTN (hypertension)    Irregular periods/menstrual cycles    Joint pain    Lactose intolerance    Leg edema    goes away when props legs up per pt   lower Back pain    Mastitis    h/o right   Obesity    PCOS (polycystic ovarian syndrome)    Pre-diabetes    PTSD (post-traumatic stress disorder)    Sleep apnea    no cpap used due to cost per pt   Tobacco abuse    Stopped 2021   Uterine fibroid      Home Medications Prior to Admission medications   Medication Sig Start Date End Date Taking? Authorizing Provider  amLODipine  (NORVASC ) 5 MG tablet Take 1 tablet (5 mg total) by mouth daily. 05/08/23 05/02/24  Clearnce Curia, NP   Blood Pressure Monitoring (ADULT BLOOD PRESSURE CUFF LG) KIT Check your blood pressure once daily. Keep a written log and bring to your next doctor's appointment. 08/23/20   Edsel Grace, MD  diphenhydrAMINE  (BENADRYL ) 25 mg capsule Take 25 mg by mouth at bedtime as needed for itching. 11/27/19   [provider]  ibuprofen  (ADVIL ) 400 MG tablet Take 400 mg by mouth every 6 (six) hours as needed.    [provider]  levocetirizine (XYZAL) 5 MG tablet Take 15 mg by mouth daily.    [provider]  LORazepam  (ATIVAN ) 0.5 MG tablet Take 0.5 mg by mouth every 8 (eight) hours as needed for anxiety. 07/07/22   [provider]  metoprolol  succinate (TOPROL  XL) 25 MG 24 hr tablet Take 1 tablet (25 mg total) by mouth daily. 05/08/23   Clearnce Curia, NP  olmesartan  (BENICAR ) 40 MG tablet Take 1 tablet (40 mg total) by mouth daily. 07/15/23   Maudine Sos, MD  omeprazole (PRILOSEC) 20 MG capsule Take 20 mg by mouth 2 (two) times daily.    [provider]  pimecrolimus  (ELIDEL ) 1 % cream Apply topically 2 (two) times daily. OK to  use on the face. 06/11/23   Rochester Chuck, MD  polyethylene glycol powder (GLYCOLAX /MIRALAX ) 17 GM/SCOOP powder Take 17 grams by mouth daily. 12/07/22   Debbra Fairy, PA-C  spironolactone  (ALDACTONE ) 25 MG tablet Take 1 tablet (25 mg total) by mouth daily. 11/28/22   Clearnce Curia, NP  terbinafine  (LAMISIL ) 250 MG tablet Take 1 tablet (250 mg total) by mouth daily. 05/01/23   Dot Gazella, DPM  triamcinolone  ointment (KENALOG ) 0.1 % Apply topically 2 (two) times daily as needed. Avoid the face. 06/11/23   Rochester Chuck, MD      Allergies    Tramadol , Hydrocodone , Oxycodone , and Tylenol  [acetaminophen ]    Review of Systems   Review of Systems  Physical Exam Updated Vital Signs BP 113/84 (BP Location: Right Arm)   Pulse 65   Temp 98 F (36.7 C) (Oral)   Resp 20   Ht 5\' 3"  (1.6 m)   Wt 116.5 kg   LMP  (LMP  Unknown) Comment: PARTIAL HYSTERECTOMY  SpO2 100%   BMI 45.50 kg/m  Physical Exam Vitals and nursing note reviewed.  Constitutional:      General: She is not in acute distress.    Appearance: She is well-developed. She is not diaphoretic.  HENT:     Head: Normocephalic and atraumatic.  Eyes:     Conjunctiva/sclera: Conjunctivae normal.  Cardiovascular:     Rate and Rhythm: Normal rate and regular rhythm.     Heart sounds: Normal heart sounds. No murmur heard.    No friction rub. No gallop.  Pulmonary:     Effort: Pulmonary effort is normal. No respiratory distress.     Breath sounds: Normal breath sounds. No wheezing or rales.  Abdominal:     General: There is no distension.     Palpations: Abdomen is soft.     Tenderness: There is abdominal tenderness (lower abdomen, worse on right side). There is no guarding.  Genitourinary:    Rectum: Guaiac result negative (scant red color on rectal exam, hemoccult negative).  Musculoskeletal:        General: No tenderness.     Cervical back: Normal range of motion.  Skin:    General: Skin is warm and dry.     Findings: No erythema or rash.  Neurological:     Mental Status: She is alert and oriented to person, place, and time.     ED Results / Procedures / Treatments   Labs (all labs ordered are listed, but only abnormal results are displayed) Labs Reviewed  URINALYSIS, ROUTINE W REFLEX MICROSCOPIC - Abnormal; Notable for the following components:      Result Value   Color, Urine COLORLESS (*)    All other components within normal limits  CBC WITH DIFFERENTIAL/PLATELET - Abnormal; Notable for the following components:   Lymphs Abs 4.2 (*)    All other components within normal limits  COMPREHENSIVE METABOLIC PANEL WITH GFR - Abnormal; Notable for the following components:   Glucose, Bld 126 (*)    Creatinine, Ser 1.05 (*)    All other components within normal limits  URINE CULTURE  CK  OCCULT BLOOD X 1 CARD TO LAB, STOOL     EKG None  Radiology CT ABDOMEN PELVIS WO CONTRAST Result Date: 07/30/2023 CLINICAL DATA:  Abdominal/flank pain, hematuria (Ped 0-17y) ?hematuria, hematochezia, lower abdominal pain worse on right, r/o appendicitis, eval for colitis or diverticulitis EXAM: CT ABDOMEN AND PELVIS WITHOUT CONTRAST TECHNIQUE: Multidetector CT imaging of the abdomen  and pelvis was performed following the standard protocol without IV contrast. RADIATION DOSE REDUCTION: This exam was performed according to the departmental dose-optimization program which includes automated exposure control, adjustment of the mA and/or kV according to patient size and/or use of iterative reconstruction technique. COMPARISON:  None Available. FINDINGS: Lower chest: No acute abnormality. Hepatobiliary: No focal liver abnormality. Status post cholecystectomy. No biliary dilatation. Pancreas: No focal lesion. Normal pancreatic contour. No surrounding inflammatory changes. No main pancreatic ductal dilatation. Spleen: Normal in size without focal abnormality. Adrenals/Urinary Tract: No adrenal nodule bilaterally. No nephrolithiasis and no hydronephrosis. No definite contour-deforming renal mass. No ureterolithiasis or hydroureter. The urinary bladder is unremarkable. Stomach/Bowel: Stomach is within normal limits. No evidence of bowel wall thickening or dilatation. Appendix appears normal. Vascular/Lymphatic: No abdominal aorta or iliac aneurysm. Mild atherosclerotic plaque of the aorta and its branches. No abdominal, pelvic, or inguinal lymphadenopathy. Reproductive: Status post hysterectomy. No adnexal masses. Other: No intraperitoneal free fluid. No intraperitoneal free gas. No organized fluid collection. Musculoskeletal: No abdominal wall hernia or abnormality. No suspicious lytic or blastic osseous lesions. No acute displaced fracture. IMPRESSION: No acute intra-abdominal or intrapelvic abnormality with limited evaluation on this noncontrast study.  Electronically Signed   By: Morgane  Naveau M.D.   On: 07/30/2023 20:08    Procedures Procedures    Medications Ordered in ED Medications - No data to display  ED Course/ Medical Decision Making/ A&P                                     48 year old female with a history of hypertension, PCOS, OSA, who presents with concern for possible hematuria, hematochezia, abdominal pain.  Differential diagnosis includes urinary tract infection, nephrolithiasis, diverticulitis, colitis, rhabdomyolysis,  hepatitis/hyperbilirubinemia, change in color due to food dyes.  Labs completed and personally by interpreted by me show a urinalysis without signs of urinary tract infection or hematuria.  Given her abdominal pain and tenderness on exam, other labs were obtained which showed normal hgb, normal wbc, no clinically significant electrolyte abnormalities. CK obtained due to concern for urinary color changes shows no sign of elevation.  CT abdomen pelvis was obtained which shows no acute abnormalities.  Low clinical suspicion for ovarian torsion, TOA by history, exam.   Scant red color on glove, hemoccult negative.  Has been eating beets in salad and possible urine and stool color changes could be due to beets. Recommend follow up with PCP, consider GI follow up for stool changes/diarrhea.  Patient discharged in stable condition with understanding of reasons to return.           Final Clinical Impression(s) / ED Diagnoses Final diagnoses:  Generalized abdominal pain  Diarrhea, unspecified type  Red stool  Red urine due to beet ingestion    Rx / DC Orders ED Discharge Orders     None         Scarlette Currier, MD 07/31/23 1348

## 2023-07-30 NOTE — Telephone Encounter (Signed)
 That sounds like a disaster!   Drexel Gentles, MD [facility] 

## 2023-07-30 NOTE — ED Triage Notes (Signed)
 Arrives POV with complaints of suspected blood in urine x1 day. Also reporting 4/10 pain with urination.

## 2023-07-31 LAB — URINE CULTURE: Culture: <10000 — AB

## 2023-08-04 NOTE — Telephone Encounter (Signed)
 Do you want me to try and get her on something else?

## 2023-08-05 NOTE — Telephone Encounter (Signed)
 So the HUB is dead end?   I talked to [place] and she is willing to try Dupixent.   Drexel Gentles, MD [facility] 

## 2023-08-13 ENCOUNTER — Other Ambulatory Visit (HOSPITAL_COMMUNITY): Payer: Self-pay

## 2023-08-14 ENCOUNTER — Other Ambulatory Visit (HOSPITAL_COMMUNITY): Payer: Self-pay

## 2023-08-15 ENCOUNTER — Other Ambulatory Visit (HOSPITAL_COMMUNITY): Payer: Self-pay

## 2023-08-20 DIAGNOSIS — F4323 Adjustment disorder with mixed anxiety and depressed mood: Secondary | ICD-10-CM | POA: Diagnosis not present

## 2023-09-01 NOTE — Telephone Encounter (Signed)
L/m for patient to contact me to advise approval, copay card and submit to [facility] 

## 2023-09-07 ENCOUNTER — Encounter (HOSPITAL_BASED_OUTPATIENT_CLINIC_OR_DEPARTMENT_OTHER): Payer: Self-pay | Admitting: Cardiovascular Disease

## 2023-09-07 DIAGNOSIS — I10 Essential (primary) hypertension: Secondary | ICD-10-CM

## 2023-09-07 DIAGNOSIS — Z5181 Encounter for therapeutic drug level monitoring: Secondary | ICD-10-CM

## 2023-09-08 NOTE — Telephone Encounter (Signed)
 Let's stop the amlodipine  and increase the spironolactone  from 25 to 50.  That should balance out, as her one BP reading was good. Have her check at home 3-4 times per week or if she feels hypotensive.  Repeat BMET in 2 weeks.

## 2023-09-14 ENCOUNTER — Telehealth: Admitting: Family Medicine

## 2023-09-14 DIAGNOSIS — H01001 Unspecified blepharitis right upper eyelid: Secondary | ICD-10-CM | POA: Diagnosis not present

## 2023-09-14 DIAGNOSIS — H01004 Unspecified blepharitis left upper eyelid: Secondary | ICD-10-CM

## 2023-09-14 MED ORDER — ERYTHROMYCIN 5 MG/GM OP OINT
1.0000 | TOPICAL_OINTMENT | Freq: Every day | OPHTHALMIC | 0 refills | Status: AC
  Filled 2023-09-14: qty 7, 7d supply, fill #0

## 2023-09-14 NOTE — Progress Notes (Signed)
 Virtual Visit Consent   ROCHELL PUETT, you are scheduled for a virtual visit with a [facility] provider today. Just as with appointments in the office, your consent must be obtained to participate. Your consent will be active for this visit and any virtual visit you may have with one of our providers in the next 365 days. If you have a MyChart account, a copy of this consent can be sent to you electronically.  As this is a virtual visit, video technology does not allow for your provider to perform a traditional examination. This may limit your provider's ability to fully assess your condition. If your provider identifies any concerns that need to be evaluated in person or the need to arrange testing (such as labs, EKG, etc.), we will make arrangements to do so. Although advances in technology are sophisticated, we cannot ensure that it will always work on either your end or our end. If the connection with a video visit is poor, the visit may have to be switched to a telephone visit. With either a video or telephone visit, we are not always able to ensure that we have a secure connection.  By engaging in this virtual visit, you consent to the provision of healthcare and authorize for your insurance to be billed (if applicable) for the services provided during this visit. Depending on your insurance coverage, you may receive a charge related to this service.  I need to obtain your verbal consent now. Are you willing to proceed with your visit today? FREDERIKA HUKILL has provided verbal consent on 09/14/2023 for a virtual visit (video or telephone). Albertha Huger, FNP  Date: 09/14/2023 7:28 PM   Virtual Visit via Video Note   I, Albertha Huger, connected with  DEEDEE LYBARGER  ([id number], [birth year]) on 09/14/23 at  7:00 PM EDT by a video-enabled telemedicine application and verified that I am speaking with the correct person using two identifiers.  Location: Patient: Virtual Visit Location Patient:  Home Provider: Virtual Visit Location Provider: Home Office   I discussed the limitations of evaluation and management by telemedicine and the availability of in person appointments. The patient expressed understanding and agreed to proceed.    History of Present Illness: MCKALA PANTALEON is a 48 y.o. who identifies as a female who was assigned female at birth, and is being seen today for crusting, irritation and redness along upper eyelashes. Lower lids normal. .  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   Anxiety 10/20/2020   Cervical dysplasia 06/09/2019   Uterine polyp 04/20/2019   Chronic bilateral low back pain without sciatica 09/23/2018   Obesity, Class III, BMI 40-49.9 (morbid obesity) 09/23/2018   Pelvic pain 07/18/2018   Bilateral carpal tunnel syndrome 02/09/2018   Acute lumbar myofascial strain 12/29/2017   Chronic post-traumatic stress disorder (PTSD) 12/29/2017   Morbid obesity (HCC) 11/13/2017   Insomnia due to stress 11/09/2017   Chronic toe pain, left foot 11/09/2017   Other hyperlipidemia 05/13/2017   Vitamin D  deficiency 05/13/2017   Prediabetes 05/13/2017   Enlarged thyroid  04/29/2017   PCOS (polycystic ovarian syndrome) 04/29/2017   Irregular periods/menstrual cycles 02/12/2017   Vaginal dryness 02/12/2017   Left carpal tunnel syndrome 07/08/2016  Carpal tunnel syndrome, right upper limb 07/07/2013   Palpitations 10/27/2011   Dyshidrotic eczema 11/06/2008   Tobacco use disorder 11/01/2008   BMI 45.0-49.9, adult (HCC) 02/03/2007   Essential hypertension 05/28/2006   GERD (gastroesophageal reflux disease) 05/28/2006    Allergies:  Allergies  Allergen Reactions   Tramadol  Itching    Hydrocodone      itching   Oxycodone  Itching   Tylenol  [Acetaminophen ]     itching   Medications:  Current Outpatient Medications:    erythromycin ophthalmic ointment, Place 1 Application into both eyes at bedtime for 7 days., Disp: 7 g, Rfl: 0   amLODipine  (NORVASC ) 5 MG tablet, Take 1 tablet (5 mg total) by mouth daily., Disp: 90 tablet, Rfl: 1   Blood Pressure Monitoring (ADULT BLOOD PRESSURE CUFF LG) KIT, Check your blood pressure once daily. Keep a written log and bring to your next doctor's appointment., Disp: 1 kit, Rfl: 0   diphenhydrAMINE  (BENADRYL ) 25 mg capsule, Take 25 mg by mouth at bedtime as needed for itching., Disp: , Rfl:    ibuprofen  (ADVIL ) 400 MG tablet, Take 400 mg by mouth every 6 (six) hours as needed., Disp: , Rfl:    levocetirizine (XYZAL) 5 MG tablet, Take 15 mg by mouth daily., Disp: , Rfl:    LORazepam  (ATIVAN ) 0.5 MG tablet, Take 0.5 mg by mouth every 8 (eight) hours as needed for anxiety., Disp: , Rfl:    metoprolol  succinate (TOPROL  XL) 25 MG 24 hr tablet, Take 1 tablet (25 mg total) by mouth daily., Disp: 90 tablet, Rfl: 1   olmesartan  (BENICAR ) 40 MG tablet, Take 1 tablet (40 mg total) by mouth daily., Disp: 90 tablet, Rfl: 3   omeprazole (PRILOSEC) 20 MG capsule, Take 20 mg by mouth 2 (two) times daily., Disp: , Rfl:    pimecrolimus  (ELIDEL ) 1 % cream, Apply topically 2 (two) times daily. OK to use on the face., Disp: 30 g, Rfl: 5   polyethylene glycol powder (GLYCOLAX /MIRALAX ) 17 GM/SCOOP powder, Take 17 grams by mouth daily., Disp: 238 g, Rfl: 0   spironolactone  (ALDACTONE ) 25 MG tablet, Take 1 tablet (25 mg total) by mouth daily., Disp: 90 tablet, Rfl: 3   terbinafine  (LAMISIL ) 250 MG tablet, Take 1 tablet (250 mg total) by mouth daily., Disp: 90 tablet, Rfl: 0   triamcinolone  ointment (KENALOG ) 0.1 %, Apply topically 2 (two) times daily as needed. Avoid the face., Disp: 454 g, Rfl: 0  Observations/Objective: Patient is well-developed, well-nourished in  no acute distress.  Resting comfortably  at home.  Head is normocephalic, atraumatic.  No labored breathing.  Speech is clear and coherent with logical content.  Patient is alert and oriented at baseline.    Assessment and Plan: 1. Blepharitis of upper eyelids of both eyes, unspecified type (Primary)  Warm compresses. Apply a thin ribbon along upper eyelashes. UC as needed.  Follow Up Instructions: I discussed the assessment and treatment plan with the patient. The patient was provided an opportunity to ask questions and all were answered. The patient agreed with the plan and demonstrated an understanding of the instructions.  A copy of instructions were sent to the patient via MyChart unless otherwise noted below.     The patient was advised to call back or seek an in-person evaluation if the symptoms worsen or if the condition fails to improve as anticipated.    Kaedance Magos, FNP

## 2023-09-14 NOTE — Telephone Encounter (Signed)
L/m for patient to contact me again ?

## 2023-09-14 NOTE — Patient Instructions (Signed)
Blepharitis Blepharitis refers to inflammation of the eyelids. It is a common condition and can cause dryness or grittiness in the eyes. Other symptoms may include: Reddish, scaly skin around the scalp and eyebrows. Burning or itching of the eyelids. Eye discharge at night that causes the eyelashes to stick together in the morning. Eyelashes that fall out. Redness of the eyes. Sensitivity to light. Follow these instructions at home: Pay attention to any changes in how your eyes look or feel. Tell your health care provider about any changes. Follow these instructions to help with your condition. Keeping clean Wash your hands often with soap and water for at least 20 seconds. Clean your eyes and wash the edges of your eyelids with diluted baby shampoo or commercial eyelid wipes. Do this 2 or more times a day. Wash your face and eyebrows at least once a day. Use a clean towel each time you dry your eyelids. Do not use this towel to clean or dry other areas of your body. Do not share your towel with anyone. General instructions Avoid wearing makeup until you get better. Do not share makeup with anyone. Avoid rubbing your eyes. Use warm compresses on the eyes for 5-10 minutes. Do this 1 or 2 times a day, or as told by your health care provider. You can use warm water on a towel, but a microwaveable heating pad often stays warm longer. The pad should be very warm but not hot enough to burn the skin. If you were prescribed an antibiotic ointment or steroid drops, apply or use the medicine as told by your health care provider. Do not stop using the medicine even if you feel better. Keep all follow-up visits. This is important. Contact a health care provider if: Your eyelids feel hot. You have blisters or a rash on your eyelids. The inflammation gets worse or does not go away in 2-4 days. Get help right away if: You have pain or redness that gets worse or spreads to other parts of your face. Your  vision changes. You have pain when looking at lights or moving objects. You have a fever. Summary Blepharitis refers to inflammation of the eyelids. It can cause dryness and grittiness in the eyes. Pay attention to any changes in how your eyes look or feel. Tell your health care provider about any changes. Follow home care instructions as told by your health care provider. Wash your hands often with soap and water for at least 20 seconds. Avoid wearing makeup. Do not rub your eyes. If you were prescribed an antibiotic ointment or steroid drops, apply or use the medicine as told by your health care provider. Get help right away if you have a fever, vision changes, pain or redness that gets worse or spreads to other parts of your face, or pain when looking at lights or moving objects. This information is not intended to replace advice given to you by your health care provider. Make sure you discuss any questions you have with your health care provider. Document Revised: 04/18/2020 Document Reviewed: 04/18/2020 Elsevier Patient Education  2024 [employer].

## 2023-09-15 ENCOUNTER — Other Ambulatory Visit: Payer: Self-pay

## 2023-09-15 ENCOUNTER — Other Ambulatory Visit (HOSPITAL_COMMUNITY): Payer: Self-pay

## 2023-09-15 ENCOUNTER — Telehealth (HOSPITAL_BASED_OUTPATIENT_CLINIC_OR_DEPARTMENT_OTHER): Payer: Self-pay | Admitting: Cardiovascular Disease

## 2023-09-15 DIAGNOSIS — I10 Essential (primary) hypertension: Secondary | ICD-10-CM

## 2023-09-15 MED ORDER — SPIRONOLACTONE 50 MG PO TABS
50.0000 mg | ORAL_TABLET | Freq: Every day | ORAL | 3 refills | Status: AC
  Filled 2023-09-15: qty 90, 90d supply, fill #0
  Filled 2023-12-10: qty 90, 90d supply, fill #1
  Filled 2024-03-28: qty 90, 90d supply, fill #2

## 2023-09-15 NOTE — Telephone Encounter (Signed)
 Called and spoke to pt. Full name and DOB verified.  Pt stated she had numerous questions in regards to stopping Amlodipine  and increasing Spiro, their uses and how the changes would effect her. Any and all questions were answered.  MC sent to pt on how to help her edema and explanation on medication changes. Lab slips to be mailed to pt.

## 2023-09-15 NOTE — Telephone Encounter (Signed)
 Pt c/o medication issue:  1. Name of Medication:   amLODipine  (NORVASC ) 5 MG tablet  spironolactone  (ALDACTONE ) 25 MG tablet   2. How are you currently taking this medication (dosage and times per day)?   As prescribed  3. Are you having a reaction (difficulty breathing--STAT)?   4. What is your medication issue?   Patient stated her medication was recently changed and she wants a call back to discuss questions she has about this.

## 2023-09-16 DIAGNOSIS — F4323 Adjustment disorder with mixed anxiety and depressed mood: Secondary | ICD-10-CM | POA: Diagnosis not present

## 2023-09-21 DIAGNOSIS — F4323 Adjustment disorder with mixed anxiety and depressed mood: Secondary | ICD-10-CM | POA: Diagnosis not present

## 2023-09-24 ENCOUNTER — Ambulatory Visit: Payer: Self-pay

## 2023-09-24 NOTE — Telephone Encounter (Signed)
 Duplicate encounter, please see other triage note.    Copied from CRM [id number]. Topic: Clinical - Pink Word Triage >> Sep 24, 2023 11:25 AM Treva T wrote: Reason for Triage: Patient calling, states she has a bad yeast infection, having vaginal itching and irritation. Uncomfortable when use restroom.  Patient can be reached at [phone] to discuss further. >> Sep 24, 2023 11:28 AM Treva T wrote: Reason for Triage: Patient calling, states she has a bad yeast infection, having vaginal itching and irritation. Uncomfortable when use restroom.  Patient can be reached at [phone] to discuss further.

## 2023-09-24 NOTE — Telephone Encounter (Addendum)
 First attempt; no answer Second attempt; no answer   Reason for Triage: Patient calling, states she has a bad yeast infection, having vaginal itching and irritation. Uncomfortable when use restroom.   Patient can be reached at [phone] to discuss further.

## 2023-09-24 NOTE — Telephone Encounter (Signed)
 Reason for Triage: Patient calling, states she has a bad yeast infection, having vaginal itching and irritation. Uncomfortable when use restroom.   Patient can be reached at [phone] to discuss further.     FYI Only or Action Required?: FYI only for provider.  Patient was last seen in primary care on 09/14/2023 by Blair, Diane W, FNP. Called Nurse Triage reporting Vaginitis. Symptoms began several days ago. Interventions attempted: Nothing. Symptoms are: gradually worsening.  Triage Disposition: See Physician Within 24 Hours, Call PCP When Office is Open  Patient/caregiver understands and will follow disposition?: No    Reason for Disposition  MODERATE-SEVERE itching (i.e., interferes with school, work, or sleep)  Answer Assessment - Initial Assessment Questions 1. SYMPTOM: What's the main symptom you're concerned about? (e.g., pain, itching, dryness)     Irritation 2. LOCATION: Where is the irritation located? (e.g., inside/outside, left/right)     Inside  3. ONSET: When did the irritation start?     4-5 days  4. PAIN: Is there any pain? If Yes, ask: How bad is it? (Scale: 1-10; mild, moderate, severe)   -  MILD (1-3): Doesn't interfere with normal activities.    -  MODERATE (4-7): Interferes with normal activities (e.g., work or school) or awakens from sleep.     -  SEVERE (8-10): Excruciating pain, unable to do any normal activities.     Mild to moderate  5. ITCHING: Is there any itching? If Yes, ask: How bad is it? (Scale: 1-10; mild, moderate, severe)     No 6. CAUSE: What do you think is causing the discharge? Have you had the same problem before? What happened then?     Believes she has a yeast infection  7. OTHER SYMPTOMS: Do you have any other symptoms? (e.g., fever, itching, vaginal bleeding, pain with urination, injury to genital area, vaginal foreign body)     Some swelling  Protocols used: Vaginal Symptoms-A-AH

## 2023-09-26 ENCOUNTER — Telehealth: Admitting: Nurse Practitioner

## 2023-09-26 DIAGNOSIS — B3731 Acute candidiasis of vulva and vagina: Secondary | ICD-10-CM | POA: Diagnosis not present

## 2023-09-26 MED ORDER — FLUCONAZOLE 150 MG PO TABS
150.0000 mg | ORAL_TABLET | Freq: Once | ORAL | 0 refills | Status: AC
  Filled 2023-09-26: qty 1, 1d supply, fill #0

## 2023-09-26 NOTE — Progress Notes (Signed)

## 2023-09-26 NOTE — Progress Notes (Signed)
 I have spent 5 minutes in review of e-visit questionnaire, review and updating patient chart, medical decision making and response to patient.   Claiborne Rigg, NP

## 2023-09-28 ENCOUNTER — Other Ambulatory Visit (HOSPITAL_COMMUNITY): Payer: Self-pay

## 2023-09-28 DIAGNOSIS — Z5181 Encounter for therapeutic drug level monitoring: Secondary | ICD-10-CM | POA: Diagnosis not present

## 2023-09-28 DIAGNOSIS — I10 Essential (primary) hypertension: Secondary | ICD-10-CM | POA: Diagnosis not present

## 2023-09-28 NOTE — Telephone Encounter (Signed)
 No response from paient

## 2023-09-29 ENCOUNTER — Ambulatory Visit: Admitting: Family Medicine

## 2023-09-29 LAB — BASIC METABOLIC PANEL WITH GFR
BUN/Creatinine Ratio: 9 (ref 9–23)
BUN: 9 mg/dL (ref 6–24)
CO2: 21 mmol/L (ref 20–29)
Calcium: 9.9 mg/dL (ref 8.7–10.2)
Chloride: 102 mmol/L (ref 96–106)
Creatinine, Ser: 0.95 mg/dL (ref 0.57–1.00)
Glucose: 77 mg/dL (ref 70–99)
Potassium: 4.1 mmol/L (ref 3.5–5.2)
Sodium: 137 mmol/L (ref 134–144)
eGFR: 74 mL/min/{1.73_m2} (ref >59)

## 2023-09-30 ENCOUNTER — Ambulatory Visit: Payer: Self-pay | Admitting: Cardiovascular Disease

## 2023-10-01 NOTE — Telephone Encounter (Signed)
 I talked to the patient and she is going to contact Tammy.  Marty Shaggy, MD [facility] 

## 2023-10-05 DIAGNOSIS — H524 Presbyopia: Secondary | ICD-10-CM | POA: Diagnosis not present

## 2023-10-05 DIAGNOSIS — H5213 Myopia, bilateral: Secondary | ICD-10-CM | POA: Diagnosis not present

## 2023-10-05 DIAGNOSIS — H52209 Unspecified astigmatism, unspecified eye: Secondary | ICD-10-CM | POA: Diagnosis not present

## 2023-10-09 DIAGNOSIS — F4323 Adjustment disorder with mixed anxiety and depressed mood: Secondary | ICD-10-CM | POA: Diagnosis not present

## 2023-10-22 ENCOUNTER — Encounter (HOSPITAL_BASED_OUTPATIENT_CLINIC_OR_DEPARTMENT_OTHER): Payer: Self-pay | Admitting: Cardiovascular Disease

## 2023-10-22 NOTE — Telephone Encounter (Signed)
 Please review and advise.

## 2023-10-26 ENCOUNTER — Ambulatory Visit

## 2023-11-06 ENCOUNTER — Ambulatory Visit

## 2023-11-20 ENCOUNTER — Other Ambulatory Visit (HOSPITAL_COMMUNITY)
Admission: RE | Admit: 2023-11-20 | Discharge: 2023-11-20 | Disposition: A | Discharge status: Home / Self Care | Source: Ambulatory Visit | Attending: Family Medicine | Admitting: Family Medicine

## 2023-11-20 ENCOUNTER — Ambulatory Visit (INDEPENDENT_AMBULATORY_CARE_PROVIDER_SITE_OTHER)

## 2023-11-20 ENCOUNTER — Other Ambulatory Visit (HOSPITAL_COMMUNITY): Payer: Self-pay

## 2023-11-20 VITALS — BP 128/57 | HR 71 | Ht 63.0 in | Wt 252.5 lb

## 2023-11-20 DIAGNOSIS — Z0000 Encounter for general adult medical examination without abnormal findings: Secondary | ICD-10-CM

## 2023-11-20 DIAGNOSIS — Z1211 Encounter for screening for malignant neoplasm of colon: Secondary | ICD-10-CM

## 2023-11-20 DIAGNOSIS — F1010 Alcohol abuse, uncomplicated: Secondary | ICD-10-CM

## 2023-11-20 DIAGNOSIS — Z6841 Body Mass Index (BMI) 40.0 and over, adult: Secondary | ICD-10-CM

## 2023-11-20 DIAGNOSIS — K5904 Chronic idiopathic constipation: Secondary | ICD-10-CM

## 2023-11-20 DIAGNOSIS — M545 Low back pain, unspecified: Secondary | ICD-10-CM | POA: Diagnosis not present

## 2023-11-20 DIAGNOSIS — N898 Other specified noninflammatory disorders of vagina: Secondary | ICD-10-CM

## 2023-11-20 DIAGNOSIS — G8929 Other chronic pain: Secondary | ICD-10-CM

## 2023-11-20 MED ORDER — POLYETHYLENE GLYCOL 3350 17 GM/SCOOP PO POWD
17.0000 g | Freq: Every day | ORAL | 0 refills | Status: DC
  Filled 2023-11-20: qty 476, 28d supply, fill #0

## 2023-11-20 NOTE — Assessment & Plan Note (Signed)
 Poorly controlled, previously evaluated by Dr. Jerri in [facility] but did not want procedural intervention.  MRI lumbar spine done in 08/17/2021 with no stenosis and extensive facet arthropathy -Follow-up after nutrition counseling and attempted weight loss.

## 2023-11-20 NOTE — Patient Instructions (Addendum)
 Thank you for visiting the clinic today, it was good to see you!  Please always bring your medication bottles  In today's visit we discussed:  Weight: I have put in an x-ray to look at the extent of your arthritis, try to do low weight bearing exercises like water exercises as tolerated. [facility] employees get a discount at [facility] so you can go there. I have also put in a referral to meet with a nutritionist.  They will call you to schedule the colonoscopy  Constipation: Eat more vegetables and fruits, take the senakot and the miralax  twice a day until you have regular bowel movements without straining.  Vaginal discharge: I will message you about the results and send in a prescription if applicable.  Please follow-up in 1 month for alcohol use, anxiety, and blood pressure  For any questions, please call the office at [phone] or send me a message in MyChart. Have a great day!  -Fairy Amy, MD  [facility] Resident, PGY-1

## 2023-11-20 NOTE — Progress Notes (Signed)
 SUBJECTIVE:   Chief compliant/HPI: annual examination  Claudia Wall is a 48 y.o. who presents today for an annual exam.   History tabs reviewed and updated.   Review of systems form reviewed and notable:  BP: recently d/c amlodipine  and increased spironolactone  from 25 to 50mg . No dizziness or light headedness. One previous episode of palpitations that she went to urgent care  Back pain: Lumbar back pain dull achy that radiates down bilateral legs. Tried physical therapy for 2 months which didn't help. Does some stretching at home which does help a little. Ongoing for 7 years, does improve when she takes her Advil  400 mg. Worse in the morning, keeps her up at night, no improvement with movement.  Also has seen [facility] spine specialist diagnosed with facet arthropathy and they recommended nerve ablation which she did not want to do.  Vaginal discharge: Patient reports that over the last couple of days she has had increased vaginal discharge getting on her underwear, accompanied with some vaginal itchiness and is suspicious that is similar to previous candidal infections.  No vaginal bleeding, history of total abdominal hysterectomy.  Is sexually active currently.  Weight: Previously on Wegovy  and had lost 20 pounds and they have all come back b/c insurance stopped covering it. Eats lots of snacks, like salami, cheese, cake, ice cream. Eats sandwiches, lunch at work usually catered from [employer]. Used to see Dr. Denyse, tried keto and low carb diets with some weight loss but the weight came back.   Alcohol use 8-12 beers and two mixed drinks every Friday, about 3-4 beers on Saturday for the last several decades.  Sometimes she feels like it's too much drinking, stays in bed all day Saturday. Needs some drinks Saturday morning to get my day going. Strong cravings every Friday. Does not feel ready to quit.  Constipation: Straining when she poops, passing hard stools, BM about every  3 days, also has had days of watery/yellowy stool 1-2 in a day. Drinks about two bottles of water a day, and very low fiber diet.   OBJECTIVE:   BP (!) 128/57   Pulse 71   Ht 5' 3 (1.6 m)   Wt 252 lb 8 oz (114.5 kg)   LMP  (LMP Unknown) Comment: PARTIAL HYSTERECTOMY  SpO2 100%   BMI 44.73 kg/m    Cardiac: Regular rate and rhythm. Normal S1/S2. No murmurs, rubs, or gallops appreciated. Lungs: Clear bilaterally to ascultation.  Abdomen: Normoactive bowel sounds. No tenderness to deep or light palpation. No rebound or guarding.   Psych: Pleasant and appropriate  Pelvic exam: normal external genitalia, vulva, vagina, cervix, uterus and adnexa, VULVA: normal appearing vulva with no masses, tenderness or lesions, VAGINA: normal appearing vagina with normal color and discharge, no lesions, vaginal discharge - white and creamy, WET MOUNT done - results: DNA probe for chlamydia and GC obtained, wet prep analysis included in DNA probe because of limited lab staff, exam chaperoned by Carlyon Hamilton.   ASSESSMENT/PLAN:   Assessment & Plan Routine adult health maintenance Well-appearing female, needs to upload hep B records as she states they have been done.  Some concerns for anxiety.  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  Currently not willing to quit but interested in cutting down -AUDIT  next visit Chronic idiopathic constipation Uncontrolled, ongoing issue for several years, will benefit from colonoscopy.  Hard stringy BM every 3 days or so -MiraLAX  twice a day -Senokot every morning -Increased fiber in diet BMI 45.0-49.9, adult (HCC) Poorly controlled, reviously on Wegovy , discontinued because insurance coverage and had rebound weight gain, lots of problematic dietary  behaviors and very low tolerance for exercise because of back pain. -Nutrition referral placed -Exercise as tolerated -Encouraged to try [employer] membership to do water aerobic exercises Chronic bilateral low back pain without sciatica Poorly controlled, previously evaluated by Dr. Jerri in [facility] but did not want procedural intervention.  MRI lumbar spine done in 08/17/2021 with no stenosis and extensive facet arthropathy -Follow-up after nutrition counseling and attempted weight loss. Vaginal discharge Most consistent with history of vaginal candidiasis, could also be STI related pelvic exam done, wet prep and GC chlamydia collected -Pending GC probe with wet prep Annual Examination  See AVS for age appropriate recommendations.   PHQ score not done, reviewed and discussed.  Blood pressure reviewed and at goal.  Asked about intimate partner violence and resources given as appropriate  The patient currently uses TAH for contraception. Folate recommended as appropriate, minimum of 400 mcg per day.   Considered the following items based upon USPSTF recommendations: Diabetes screening: discussed Screening for elevated cholesterol: discussed HIV testing: Negative done 9 years ago Hepatitis C: Negative done 3 years ago Hepatitis B: Reportedly done, needs to upload records Syphilis if at high risk: discussed GC/CT at high risk, ordered.  Reviewed risk factors for latent tuberculosis and not indicated Reviewed risk factors for osteoporosis. Using FRAX tool estimated risk of major osteoporotic fracture of low, patient 48 years old %, early screening not ordered   Discussed family history, BRCA testing undecided. Tool used to risk stratify was discussed at next visit.  Cervical cancer screening: not indicated given history of hysterectomy with prior normal cytology.  Breast cancer screening: discussed potential benefits, risks including overdiagnosis and biopsy, elected to wait until age  20 Colorectal cancer screening: discussed, colonoscopy ordered if age 39 or over.   Follow up in 4 weeks or sooner if indicated.  MyChart Activation: Already signed up  Fairy Amy, MD [facility]

## 2023-11-20 NOTE — Assessment & Plan Note (Signed)
 Poorly controlled, reviously on Wegovy , discontinued because insurance coverage and had rebound weight gain, lots of problematic dietary behaviors and very low tolerance for exercise because of back pain. -Nutrition referral placed -Exercise as tolerated -Encouraged to try [employer] membership to do water aerobic exercises

## 2023-11-23 ENCOUNTER — Ambulatory Visit: Payer: Self-pay

## 2023-11-23 ENCOUNTER — Other Ambulatory Visit (HOSPITAL_COMMUNITY): Payer: Self-pay

## 2023-11-23 DIAGNOSIS — B9689 Other specified bacterial agents as the cause of diseases classified elsewhere: Secondary | ICD-10-CM

## 2023-11-23 DIAGNOSIS — E66813 Obesity, class 3: Secondary | ICD-10-CM

## 2023-11-23 LAB — CERVICOVAGINAL ANCILLARY ONLY
Bacterial Vaginitis (gardnerella): POSITIVE — AB
Candida Glabrata: NEGATIVE
Candida Vaginitis: NEGATIVE
Chlamydia: NEGATIVE
Comment: NEGATIVE
Comment: NEGATIVE
Comment: NEGATIVE
Comment: NEGATIVE
Comment: NEGATIVE
Comment: NORMAL
Neisseria Gonorrhea: NEGATIVE
Trichomonas: NEGATIVE

## 2023-11-23 MED ORDER — METRONIDAZOLE 0.75 % VA GEL
1.0000 | Freq: Every day | VAGINAL | 0 refills | Status: DC
  Filled 2023-11-23: qty 70, 5d supply, fill #0

## 2023-11-23 NOTE — Addendum Note (Signed)
 Addended by: DONAH LAYMON PARAS on: 11/23/2023 08:13 AM   Modules accepted: Level of Service

## 2023-11-23 NOTE — Progress Notes (Signed)
 Called patient today to notify the patient about her positive result for Bacterial Vaginitis. Pt states Flagyl  500 mg TID PO did not work in the past and would like to have the gel form instead.   Metronidazole  at bedtime for 5 days was prescribed  Patient verbalized an an understanding of treatment plan

## 2023-11-25 ENCOUNTER — Telehealth: Payer: Self-pay | Admitting: Family Medicine

## 2023-11-25 NOTE — Telephone Encounter (Signed)
 AFTER HOURS LINE  Pt called to emergency after-hours line regarding lumbar imaging.  States that she was told she would have an x-ray ordered of her lower back at her last visit to look for arthritis and it was not ordered.  I offered to order it for her, she states that she will go to urgent care this week to get the x-ray completed. Advised to call back with further concern.

## 2023-11-28 ENCOUNTER — Other Ambulatory Visit (HOSPITAL_COMMUNITY): Payer: Self-pay

## 2023-11-28 ENCOUNTER — Ambulatory Visit
Admission: RE | Admit: 2023-11-28 | Discharge: 2023-11-28 | Disposition: A | Discharge status: Home / Self Care | Source: Ambulatory Visit | Attending: Internal Medicine | Admitting: Internal Medicine

## 2023-11-28 VITALS — BP 110/71 | HR 80 | Temp 98.5°F | Resp 17

## 2023-11-28 DIAGNOSIS — M5442 Lumbago with sciatica, left side: Secondary | ICD-10-CM | POA: Diagnosis not present

## 2023-11-28 DIAGNOSIS — G8929 Other chronic pain: Secondary | ICD-10-CM | POA: Diagnosis not present

## 2023-11-28 MED ORDER — PREDNISONE 20 MG PO TABS
40.0000 mg | ORAL_TABLET | Freq: Every day | ORAL | 0 refills | Status: AC
  Filled 2023-11-28: qty 10, 5d supply, fill #0

## 2023-11-28 MED ORDER — BACLOFEN 10 MG PO TABS
10.0000 mg | ORAL_TABLET | Freq: Three times a day (TID) | ORAL | 0 refills | Status: DC
  Filled 2023-11-28: qty 30, 10d supply, fill #0

## 2023-11-28 NOTE — Discharge Instructions (Addendum)
 Your back pain is caused by sciatic nerve inflammation.   Take prednisone  steroid pills as prescribed starting today.  2 pills- 40mg - once daily each morning with food/breakfast for 5 days Do not take ibuprofen /naproxen /other NSAIDs while taking steroid pills as this can cause upset stomach.   Take muscle relaxer as needed for muscle spasm, mostly take this at bedtime as this medicine can cause drowsiness.  Apply heat to the pulled muscle 20 minutes on 20 minutes off as needed, heat relaxes muscles.  Perform gentle exercises and stretches to area of tenderness.  I would like for you to rest, however I do not want you to avoid moving the area. Movement and stretching will help with healing.  Red flag symptoms to watch out for are numbness/tingling to the legs, weakness, loss of bowel/bladder control, and/or worsening pain that does not respond well to medicines.  I have placed a referral to [facility].  Please call their office on Tuesday (September 2nd) to schedule an appointment.  If you develop any new or worsening symptoms or if your symptoms do not start to improve, please return here or follow-up with your primary care provider. If your symptoms are severe, please go to the emergency room.

## 2023-11-28 NOTE — ED Provider Notes (Signed)
 [facility]    CSN: [id number] Arrival date & time: 11/28/23  1404      History   Chief Complaint Chief Complaint  Patient presents with   Back Pain    Need X-ray for pcp to review - Entered by patient    HPI Claudia Wall is a 48 y.o. female.   Claudia Wall is a 48 y.o. female presenting for chief complaint of worsening chronic bilateral low back pain that started several months ago and has worsened over the last 1 to 2 months.  Pain is localized to the midline lumbar spine and radiates to the bilateral thighs.  Pain has started to radiate down the back of the left lower extremity and into the left ankle.  Pain is worsened by certain movements and she cannot identify any relieving factors for pain.  She has been treated by PCP for low back pain with physical therapy and has been evaluated by [facility] spinal specialist who recommended lumbar spinal surgery.  Patient would prefer not to have surgery.  No fall, trauma, numbness or tingling, saddle paresthesia, changes to bowel or urinary habits, unilateral extremity weakness, and previous spinal surgeries.  She is treating pain with ibuprofen  and heat without much relief. MRI in 2023 shows degenerative changes at the L5-S1 joint space.   Back Pain   Past Medical History:  Diagnosis Date   Alcohol abuse    Hx, occ on weekends   Anxiety    Constipation    Depression    GERD (gastroesophageal reflux disease)    HTN (hypertension)    Irregular periods/menstrual cycles    Joint pain    Lactose intolerance    Leg edema    goes away when props legs up per pt   lower Back pain    Mastitis    h/o right   Obesity    PCOS (polycystic ovarian syndrome)    Pre-diabetes    PTSD (post-traumatic stress disorder)    Sleep apnea    no cpap used due to cost per pt   Tobacco abuse    Stopped 2021   Uterine fibroid     Patient Active Problem List   Diagnosis Date Noted   Pruritus 07/10/2023   Non-allergic rhinitis  07/10/2023   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   S/P laparoscopic hysterectomy 09/27/2021   Uterine fibroid 09/27/2021   OSA (obstructive sleep apnea) 05/07/2021   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   Anxiety 10/20/2020   Cervical dysplasia 06/09/2019   Uterine polyp 04/20/2019   Chronic bilateral low back pain without sciatica 09/23/2018   Obesity, Class III, BMI 40-49.9 (morbid obesity) 09/23/2018   Pelvic pain 07/18/2018   Acute lumbar myofascial strain 12/29/2017   Chronic post-traumatic stress disorder (PTSD) 12/29/2017   Morbid obesity (HCC) 11/13/2017   Insomnia due to stress 11/09/2017   Chronic toe pain, left foot 11/09/2017   Other hyperlipidemia 05/13/2017   Vitamin D  deficiency 05/13/2017   Prediabetes 05/13/2017   Enlarged thyroid  04/29/2017   PCOS (polycystic ovarian syndrome) 04/29/2017   Irregular periods/menstrual cycles 02/12/2017   Vaginal dryness 02/12/2017   Carpal tunnel syndrome, right upper limb 07/07/2013   Palpitations 10/27/2011   Dyshidrotic eczema 11/06/2008   Tobacco use disorder 11/01/2008   BMI 45.0-49.9, adult (HCC) 02/03/2007   Essential hypertension 05/28/2006   GERD (gastroesophageal reflux disease) 05/28/2006    Past Surgical History:  Procedure Laterality Date   CHOLECYSTECTOMY  03/31/2000   INCISION AND DRAINAGE BREAST ABSCESS  yrs ago   IR RADIOLOGIST EVAL & MGMT  04/03/2021   IR RADIOLOGIST EVAL & MGMT  04/26/2021   left carpal tunnel release surgery  05/2018   ROBOTIC ASSISTED LAPAROSCOPIC HYSTERECTOMY AND SALPINGECTOMY Bilateral 09/27/2021   Procedure: XI ROBOTIC ASSISTED LAPAROSCOPIC HYSTERECTOMY AND SALPINGECTOMY;  Surgeon: Barbette Knock, MD;  Location: [facility];  Service: Gynecology;  Laterality: Bilateral;    OB History     Gravida  2   Para      Term      Preterm      AB      Living  2      SAB      IAB      Ectopic      Multiple      Live Births           Obstetric Comments  Vag delivery  x 2          Home Medications    Prior to Admission medications   Medication Sig Start Date End Date Taking? Authorizing Provider  baclofen  (LIORESAL ) 10 MG tablet Take 1 tablet (10 mg total) by mouth 3 (three) times daily. 11/28/23  Yes Enedelia Dorna HERO, FNP  predniSONE  (DELTASONE ) 20 MG tablet Take 2 tablets (40 mg total) by mouth daily with breakfast for 5 days. 11/28/23 12/03/23 Yes StanhopeDorna HERO, FNP  Blood Pressure Monitoring (ADULT BLOOD PRESSURE CUFF LG) KIT Check your blood pressure once daily. Keep a written log and bring to your next doctor's appointment. 08/23/20   Malvina Ellen, MD  ibuprofen  (ADVIL ) 400 MG tablet Take 400 mg by mouth every 6 (six) hours as needed.    [provider]  levocetirizine (XYZAL) 5 MG tablet Take 15 mg by mouth daily. Patient not taking: Reported on 11/20/2023    [provider]  LORazepam  (ATIVAN ) 0.5 MG tablet Take 0.5 mg by mouth every 8 (eight) hours as needed for anxiety. 07/07/22   [provider]  metoprolol  succinate (TOPROL  XL) 25 MG 24 hr tablet Take 1 tablet (25 mg total) by mouth daily. 05/08/23   Vannie Reche RAMAN, NP  metroNIDAZOLE  (METROGEL ) 0.75 % vaginal gel Place 1 Applicatorful vaginally at bedtime. For 5 days. 11/23/23   Suknaim, Houston B, DO  olmesartan  (BENICAR ) 40 MG tablet Take 1 tablet (40 mg total) by mouth daily. 07/15/23   Raford Riggs, MD  polyethylene glycol powder (GLYCOLAX /MIRALAX ) 17 GM/SCOOP powder Take 17 grams dissolved in liquid by mouth daily. 11/20/23   Lorrane Pac, MD  spironolactone  (ALDACTONE ) 50 MG tablet Take 1 tablet (50 mg total) by mouth daily. 09/15/23   Alvstad, Kristin L, RPH-CPP  triamcinolone  ointment (KENALOG ) 0.1 % Apply topically 2 (two) times daily as needed. Avoid the face. Patient not taking: Reported on 11/20/2023 06/11/23   Iva Marty Saltness, MD    Family History Family History  Problem Relation Age of Onset   Cancer Mother    Hypertension Mother     Heart disease Mother    Depression Mother    Bipolar disorder Mother    Schizophrenia Mother    Alcoholism Mother    Drug abuse Mother    Obesity Mother    Hypertension Father    Diabetes Father    Heart attack Maternal Aunt    Heart failure Maternal Grandmother     Social History Social History   Tobacco Use   Smoking status: Former    Current packs/day: 0.00    Average packs/day:  0.3 packs/day for 25.0 years (6.3 ttl pk-yrs)    Types: Cigarettes    Start date: 73    Quit date: 2022    Years since quitting: 3.6    Passive exposure: Past   Smokeless tobacco: Never   Tobacco comments:    used to smoke 1/2 to 1ppd depending on stress  Vaping Use   Vaping status: Never Used  Substance Use Topics   Alcohol use: Yes    Alcohol/week: 3.0 standard drinks of alcohol    Types: 3 Glasses of wine per week    Comment: 2-3 times a week   Drug use: Not Currently     Allergies   Tramadol , Hydrocodone , Oxycodone , and Tylenol  [acetaminophen ]   Review of Systems Review of Systems  Musculoskeletal:  Positive for back pain.  Per HPI   Physical Exam Triage Vital Signs ED Triage Vitals  Encounter Vitals Group     BP 11/28/23 1424 110/71     Girls Systolic BP Percentile --      Girls Diastolic BP Percentile --      Boys Systolic BP Percentile --      Boys Diastolic BP Percentile --      Pulse Rate 11/28/23 1424 80     Resp 11/28/23 1417 17     Temp 11/28/23 1424 98.5 F (36.9 C)     Temp Source 11/28/23 1417 Oral     SpO2 11/28/23 1424 96 %     Weight --      Height --      Head Circumference --      Peak Flow --      Pain Score 11/28/23 1423 9     Pain Loc --      Pain Education --      Exclude from Growth Chart --    No data found.  Updated Vital Signs BP 110/71 (BP Location: Right Arm)   Pulse 80   Temp 98.5 F (36.9 C) (Oral)   Resp 17   LMP  (LMP Unknown) Comment: PARTIAL HYSTERECTOMY  SpO2 96%   Visual Acuity Right Eye Distance:   Left Eye  Distance:   Bilateral Distance:    Right Eye Near:   Left Eye Near:    Bilateral Near:     Physical Exam Vitals and nursing note reviewed.  Constitutional:      Appearance: She is not ill-appearing or toxic-appearing.  HENT:     Head: Normocephalic and atraumatic.     Right Ear: Hearing and external ear normal.     Left Ear: Hearing and external ear normal.     Nose: Nose normal.     Mouth/Throat:     Lips: Pink.  Eyes:     General: Lids are normal. Vision grossly intact. Gaze aligned appropriately.     Extraocular Movements: Extraocular movements intact.     Conjunctiva/sclera: Conjunctivae normal.  Pulmonary:     Effort: Pulmonary effort is normal.  Musculoskeletal:     Cervical back: Normal and neck supple.     Thoracic back: Normal.     Lumbar back: Tenderness (Significant tenderness to palpation over the midline lumbar spine at L5-S1 and to the left lumbar paraspinous muscles.  Tender to palpation over the left SI joint.) present. No swelling, edema, deformity, signs of trauma, lacerations, spasms or bony tenderness. Normal range of motion. Negative right straight leg raise test and negative left straight leg raise test. No scoliosis.     Comments: Normal range  of motion with forward flexion at the hips.  Able to touch toes.  Changes positions from seated to standing without difficulty.   Skin:    General: Skin is warm and dry.     Capillary Refill: Capillary refill takes less than 2 seconds.     Findings: No rash.  Neurological:     General: No focal deficit present.     Mental Status: She is alert and oriented to person, place, and time. Mental status is at baseline.     Cranial Nerves: No dysarthria or facial asymmetry.  Psychiatric:        Mood and Affect: Mood normal.        Speech: Speech normal.        Behavior: Behavior normal.        Thought Content: Thought content normal.        Judgment: Judgment normal.      UC Treatments / Results  Labs (all labs  ordered are listed, but only abnormal results are displayed) Labs Reviewed - No data to display  EKG   Radiology No results found.  Procedures Procedures (including critical care time)  Medications Ordered in UC Medications - No data to display  Initial Impression / Assessment and Plan / UC Course  I have reviewed the triage vital signs and the nursing notes.  Pertinent labs & imaging results that were available during my care of the patient were reviewed by me and considered in my medical decision making (see chart for details).   1.  Chronic bilateral low back pain with left-sided sciatica Evaluation suggests sciatic nerve pain.  No red flag signs/symptoms found on exam indicating need for referral to ED for further workup.  Deferred imaging based on atraumatic mechanism of injury.  Pain has not responded well to NSAIDs, therefore will treat with short course of steroid to be started today.  Muscle relaxer as needed for muscular involvement, drowsiness precautions discussed.  Referral to Dr. Gillie at [facility] has been provided.  We discussed that our referrals from urgent care do not always go through due to lack of referrals coordinator, she has been given Dr. Shelagh phone number to call on Tuesday, September 2 to get an appointment for follow-up evaluation.   Counseled patient on potential for adverse effects with medications prescribed/recommended today, strict ER and return-to-clinic precautions discussed, patient verbalized understanding.    Final Clinical Impressions(s) / UC Diagnoses   Final diagnoses:  Chronic bilateral low back pain with left-sided sciatica     Discharge Instructions      Your back pain is caused by sciatic nerve inflammation.   Take prednisone  steroid pills as prescribed starting today.  2 pills- 40mg - once daily each morning with food/breakfast for 5 days Do not take ibuprofen /naproxen /other NSAIDs  while taking steroid pills as this can cause upset stomach.   Take muscle relaxer as needed for muscle spasm, mostly take this at bedtime as this medicine can cause drowsiness.  Apply heat to the pulled muscle 20 minutes on 20 minutes off as needed, heat relaxes muscles.  Perform gentle exercises and stretches to area of tenderness.  I would like for you to rest, however I do not want you to avoid moving the area. Movement and stretching will help with healing.  Red flag symptoms to watch out for are numbness/tingling to the legs, weakness, loss of bowel/bladder control, and/or worsening pain that does not respond well to medicines.  I have placed  a referral to [facility].  Please call their office on Tuesday (September 2nd) to schedule an appointment.  If you develop any new or worsening symptoms or if your symptoms do not start to improve, please return here or follow-up with your primary care provider. If your symptoms are severe, please go to the emergency room.    ED Prescriptions     Medication Sig Dispense Auth. Provider   baclofen  (LIORESAL ) 10 MG tablet Take 1 tablet (10 mg total) by mouth 3 (three) times daily. 30 each Enedelia Dorna HERO, FNP   predniSONE  (DELTASONE ) 20 MG tablet Take 2 tablets (40 mg total) by mouth daily with breakfast for 5 days. 10 tablet Enedelia Dorna HERO, FNP      PDMP not reviewed this encounter.   Enedelia Dorna HERO, [place] 11/28/23 [phone]

## 2023-11-28 NOTE — ED Triage Notes (Signed)
 Pt presents for spine x-ray  per PCP. Pt has chronic lower back pain that is now radiating down legs. Pt hx of arthritis in spine.

## 2023-12-10 ENCOUNTER — Other Ambulatory Visit: Payer: Self-pay

## 2023-12-10 ENCOUNTER — Other Ambulatory Visit (HOSPITAL_BASED_OUTPATIENT_CLINIC_OR_DEPARTMENT_OTHER): Payer: Self-pay | Admitting: Family

## 2023-12-10 DIAGNOSIS — I10 Essential (primary) hypertension: Secondary | ICD-10-CM

## 2023-12-11 ENCOUNTER — Other Ambulatory Visit (HOSPITAL_COMMUNITY): Payer: Self-pay

## 2023-12-11 MED ORDER — METOPROLOL SUCCINATE ER 25 MG PO TB24
25.0000 mg | ORAL_TABLET | Freq: Every day | ORAL | 1 refills | Status: AC
  Filled 2023-12-11: qty 90, 90d supply, fill #0
  Filled 2024-03-28: qty 90, 90d supply, fill #1

## 2024-01-11 ENCOUNTER — Telehealth: Admitting: Family Medicine

## 2024-01-11 ENCOUNTER — Encounter: Payer: Self-pay | Admitting: Family Medicine

## 2024-01-11 ENCOUNTER — Other Ambulatory Visit (HOSPITAL_COMMUNITY): Payer: Self-pay

## 2024-01-11 ENCOUNTER — Other Ambulatory Visit (HOSPITAL_BASED_OUTPATIENT_CLINIC_OR_DEPARTMENT_OTHER): Payer: Self-pay

## 2024-01-11 ENCOUNTER — Ambulatory Visit: Admitting: Family Medicine

## 2024-01-11 VITALS — BP 118/76 | HR 80 | Wt 252.6 lb

## 2024-01-11 DIAGNOSIS — G8929 Other chronic pain: Secondary | ICD-10-CM

## 2024-01-11 DIAGNOSIS — M545 Low back pain, unspecified: Secondary | ICD-10-CM | POA: Diagnosis not present

## 2024-01-11 MED ORDER — TIZANIDINE HCL 4 MG PO TABS
4.0000 mg | ORAL_TABLET | Freq: Four times a day (QID) | ORAL | 0 refills | Status: AC | PRN
  Filled 2024-01-11 (×2): qty 20, 5d supply, fill #0

## 2024-01-11 MED ORDER — GABAPENTIN 100 MG PO CAPS
ORAL_CAPSULE | ORAL | 3 refills | Status: DC
  Filled 2024-01-11 (×2): qty 90, 30d supply, fill #0

## 2024-01-11 MED ORDER — PREDNISONE 20 MG PO TABS
40.0000 mg | ORAL_TABLET | Freq: Every day | ORAL | 0 refills | Status: AC
  Filled 2024-01-11 (×2): qty 10, 5d supply, fill #0

## 2024-01-11 NOTE — Progress Notes (Signed)
  Because you had recent work up at end of Aug- given medication (steroids and muscle relaxers) and advised to see a neurosurgereon for your symptoms, you will need to follow up in person we feel your condition warrants further evaluation and recommend that you be seen in a face-to-face visit.   NOTE: There will be NO CHARGE for this E-Visit   If you are having a true medical emergency, please call 911.

## 2024-01-11 NOTE — Patient Instructions (Addendum)
 It was wonderful to see you today.  Please bring ALL of your medications with you to every visit.   Today we talked about:  - For your back pain-  I have prescribed prednisone  40mg  daily for 5 days to calm down inflammation  I have prescribed tizanidine  (a muscle relaxer) every 6 hours as needed for muscle spasm. Please take the first dose when you do not need to be anywhere or drive as it can make you sleepy, it affects everyone differently.  I have started you on a medication called gabapentin  for your back pain- this will also help the sciatica. You can start 100mg  at night for a few nights, if it does not make you too sleepy you can increase to 100mg  three times a day.   I will check on your neurosurgery referral.  I have also sent a message to our medication specialist to see if we can get you back on GLP-1 agonist with your history of sleep apnea. I will let you know!  If you have bowel or bladder incontinence, numbness of your privates when you wipe, or weakness of your legs please go to the ED.  Thank you for choosing [facility].   Please call [phone] with any questions about today's appointment.  Please arrive at least 15 minutes prior to your scheduled appointments.   If you had blood work today, I will send you a MyChart message or a letter if results are normal. Otherwise, I will give you a call.   If you had a referral placed, they will call you to set up an appointment. Please give us  a call if you don't hear back in the next 2 weeks.   If you need additional refills before your next appointment, please call your pharmacy first.  Don't forget to check out the [facility] at [address] [phone] Affordable prices on prescriptions and over-the-counter items, as well as services like vaccinations and medication home delivery.   Rollene Keeling, MD  Family Medicine

## 2024-01-11 NOTE — Progress Notes (Signed)
    SUBJECTIVE:   CHIEF COMPLAINT / HPI:   Discussed the use of AI scribe software for clinical note transcription with the patient, who gave verbal consent to proceed.  History of Present Illness Claudia Wall is a 48 year old female with chronic back pain who presents with an acute exacerbation of pain radiating to her legs.  Lumbosacral pain with radiculopathy - Chronic back pain with acute exacerbation since Friday - Severe pain radiating to both legs - Constant pain interfering with ability to sit, stand, walk, and lie down - Associated muscle spasms - Leg weakness attributed to pain - Ibuprofen  provided minimal relief - Prednisone  offered slight relief in the past - Baclofen  was ineffective - Gabapentin  has not been tried - bowel or bladder incontinence, saddle paresthesias, or leg weakness - does not want surgery, had referral to neurosurgery  Medication allergies and adverse reactions - Allergies to hydrocodone , oxycodone , Tylenol , and tramadol  causing itching - Cautious with NSAID medications due to high blood pressure and prior palpitations with meloxicam    OBJECTIVE:   BP 118/76   Pulse 80   Wt 252 lb 9.6 oz (114.6 kg)   LMP  (LMP Unknown) Comment: PARTIAL HYSTERECTOMY  SpO2 99%   BMI 44.75 kg/m   Physical Exam MUSCULOSKELETAL: Back tense, especially on the right. Lumbar paraspinal muscles TTP, no midline tenderness, no induration or erythema or palptable step off.  NEUROLOGICAL: strength 5/5 in b/l lower extremities, 1+ DTRs bilaterally, normal sensation bilaterally   ASSESSMENT/PLAN:   Assessment & Plan Chronic low back pain with radiculopathy and muscle spasm Chronic low back pain with acute exacerbation, radiating to legs with muscle spasms and nerve irritation. Previous MRI showed facet joint arthritis. Baclofen  ineffective, ibuprofen  minimally effective. Prednisone  somewhat effective. Hypertension limits NSAID use. Allergies to opioids limit options.  No incontinence or significant neurological deficits. Sciatic nerve likely involved. Previous physical therapy ineffective. Surgery not preferred. Considering non-surgical options like nerve ablation or injections. Discussed prednisone  side effects, tolerated well. Gabapentin  discussed for chronic nerve pain. - Prescribe tizanidine  for muscle spasms, every six hours as needed, starting at night. - Prescribe prednisone  40 mg daily for five days. - Prescribe gabapentin , starting at 100 mg at bedtime, with potential increase to three times daily. - Provide work note for an additional day off. - Advise monitoring for emergency symptoms like sudden incontinence or significant leg weakness. - Follow up on neurosurgery referral for potential non-surgical interventions.  Hypertension Hypertension well-controlled on metoprolol , olmesartan , and spironolactone .  Obstructive sleep apnea Obstructive sleep apnea not managed with CPAP due to cost. Previous Wegovy  use for weight loss beneficial but discontinued due to coverage issues. Potential for GLP-1 agonist coverage due to sleep apnea diagnosis. - Investigate coverage for GLP-1 agonist for weight management and sleep apnea.  Obesity Obesity contributing to back pain and other health issues. Previous weight loss with Wegovy  beneficial. Sleep apnea and back pain exacerbated by obesity. - Investigate coverage for GLP-1 agonist or weight management.  [name], MD [facility]

## 2024-01-14 ENCOUNTER — Encounter: Payer: Self-pay | Admitting: Family Medicine

## 2024-01-14 NOTE — Addendum Note (Signed)
 Addended by: DONZETTA QUANT E on: 01/14/2024 08:49 AM   Modules accepted: Orders

## 2024-01-21 ENCOUNTER — Telehealth: Admitting: Physician Assistant

## 2024-01-21 DIAGNOSIS — R1024 Suprapubic pain: Secondary | ICD-10-CM

## 2024-01-21 DIAGNOSIS — N760 Acute vaginitis: Secondary | ICD-10-CM

## 2024-01-22 ENCOUNTER — Other Ambulatory Visit (HOSPITAL_COMMUNITY): Payer: Self-pay

## 2024-01-22 ENCOUNTER — Telehealth: Admitting: Physician Assistant

## 2024-01-22 ENCOUNTER — Telehealth

## 2024-01-22 DIAGNOSIS — B9689 Other specified bacterial agents as the cause of diseases classified elsewhere: Secondary | ICD-10-CM

## 2024-01-22 DIAGNOSIS — N760 Acute vaginitis: Secondary | ICD-10-CM | POA: Diagnosis not present

## 2024-01-22 DIAGNOSIS — B3731 Acute candidiasis of vulva and vagina: Secondary | ICD-10-CM

## 2024-01-22 MED ORDER — FLUCONAZOLE 150 MG PO TABS
150.0000 mg | ORAL_TABLET | ORAL | 0 refills | Status: DC | PRN
  Filled 2024-01-22: qty 2, 6d supply, fill #0

## 2024-01-22 MED ORDER — METRONIDAZOLE 0.75 % VA GEL
1.0000 | Freq: Two times a day (BID) | VAGINAL | 0 refills | Status: DC
  Filled 2024-01-22: qty 70, 5d supply, fill #0

## 2024-01-22 MED ORDER — TERCONAZOLE 0.4 % VA CREA
1.0000 | TOPICAL_CREAM | Freq: Every day | VAGINAL | 0 refills | Status: AC
  Filled 2024-01-22: qty 45, 3d supply, fill #0

## 2024-01-22 NOTE — Patient Instructions (Signed)
 Claudia Wall, thank you for joining Delon CHRISTELLA Dickinson, PA-C for today's virtual visit.  While this provider is not your primary care provider (PCP), if your PCP is located in our provider database this encounter information will be shared with them immediately following your visit.   A [facility] MyChart Wall gives you access to today's visit and all your visits, tests, and labs performed at [facility]  click here if you don't have a Claudia Wall or go to [email or web address]  Consent: (Patient) Claudia Wall provided verbal consent for this virtual visit at the beginning of the encounter.  Current Medications:  Current Outpatient Medications:    fluconazole  (DIFLUCAN ) 150 MG tablet, Take 1 tablet (150 mg total) by mouth every 3 (three) days as needed., Disp: 2 tablet, Rfl: 0   metroNIDAZOLE  (METROGEL ) 0.75 % vaginal gel, Place 1 Applicatorful vaginally 2 (two) times daily., Disp: 70 g, Rfl: 0   terconazole (TERAZOL 7) 0.4 % vaginal cream, Place 1 applicator vaginally at bedtime., Disp: 45 g, Rfl: 0   Blood Pressure Monitoring (ADULT BLOOD PRESSURE CUFF LG) KIT, Check your blood pressure once daily. Keep a written log and bring to your next doctor's appointment., Disp: 1 kit, Rfl: 0   gabapentin  (NEURONTIN ) 100 MG capsule, Take 1 capsule (100 mg) at night for three days, if not too sleepy can increase to 100 mg three times a day for nerve pain., Disp: 90 capsule, Rfl: 3   ibuprofen  (ADVIL ) 400 MG tablet, Take 400 mg by mouth every 6 (six) hours as needed., Disp: , Rfl:    LORazepam  (ATIVAN ) 0.5 MG tablet, Take 0.5 mg by mouth every 8 (eight) hours as needed for anxiety., Disp: , Rfl:    metoprolol  succinate (TOPROL  XL) 25 MG 24 hr tablet, Take 1 tablet (25 mg total) by mouth daily., Disp: 90 tablet, Rfl: 1   olmesartan  (BENICAR ) 40 MG tablet, Take 1 tablet (40 mg total) by mouth daily., Disp: 90 tablet, Rfl: 3   polyethylene glycol powder  (GLYCOLAX /MIRALAX ) 17 GM/SCOOP powder, Take 17 grams dissolved in liquid by mouth daily., Disp: 510 g, Rfl: 0   spironolactone  (ALDACTONE ) 50 MG tablet, Take 1 tablet (50 mg total) by mouth daily., Disp: 90 tablet, Rfl: 3   tiZANidine  (ZANAFLEX ) 4 MG tablet, Take 1 tablet (4 mg total) by mouth every 6 (six) hours as needed for muscle spasms., Disp: 20 tablet, Rfl: 0   triamcinolone  ointment (KENALOG ) 0.1 %, Apply topically 2 (two) times daily as needed. Avoid the face., Disp: 454 g, Rfl: 0   Medications ordered in this encounter:  Meds ordered this encounter  Medications   fluconazole  (DIFLUCAN ) 150 MG tablet    Sig: Take 1 tablet (150 mg total) by mouth every 3 (three) days as needed.    Dispense:  2 tablet    Refill:  0    Supervising Provider:   LAMPTEY, PHILIP O [[id number]]   terconazole (TERAZOL 7) 0.4 % vaginal cream    Sig: Place 1 applicator vaginally at bedtime.    Dispense:  45 g    Refill:  0    Supervising Provider:   BLAISE ALEENE KIDD [id number]   metroNIDAZOLE  (METROGEL ) 0.75 % vaginal gel    Sig: Place 1 Applicatorful vaginally 2 (two) times daily.    Dispense:  70 g    Refill:  0    Supervising Provider:   BLAISE ALEENE KIDD [id number]     *If you need refills on  other medications prior to your next appointment, please contact your pharmacy*  Follow-Up: Call back or seek an in-person evaluation if the symptoms worsen or if the condition fails to improve as anticipated.  [facility] [phone]  Other Instructions Vaginal Yeast Infection, Adult  Vaginal yeast infection is a condition that causes vaginal discharge as well as soreness, swelling, and redness (inflammation) of the vagina. This is a common condition. Some women get this infection frequently. What are the causes? This condition is caused by a change in the normal balance of the yeast (Candida) and normal bacteria that live in the vagina. This change causes an overgrowth of yeast, which causes  the inflammation. What increases the risk? The condition is more likely to develop in women who: Take antibiotic medicines. Have diabetes. Take birth control pills. Are pregnant. Douche often. Have a weak body defense system (immune system). Have been taking steroid medicines for a long time. Frequently wear tight clothing. What are the signs or symptoms? Symptoms of this condition include: White, thick, creamy vaginal discharge. Swelling, itching, redness, and irritation of the vagina. The lips of the vagina (labia) may be affected as well. Pain or a burning feeling while urinating. Pain during sex. How is this diagnosed? This condition is diagnosed based on: Your medical history. A physical exam. A pelvic exam. Your health care provider will examine a sample of your vaginal discharge under a microscope. Your health care provider may send this sample for testing to confirm the diagnosis. How is this treated? This condition is treated with medicine. Medicines may be over-the-counter or prescription. You may be told to use one or more of the following: Medicine that is taken by mouth (orally). Medicine that is applied as a cream (topically). Medicine that is inserted directly into the vagina (suppository). Follow these instructions at home: Take or apply over-the-counter and prescription medicines only as told by your health care provider. Do not use tampons until your health care provider approves. Do not have sex until your infection has cleared. Sex can prolong or worsen your symptoms of infection. Ask your health care provider when it is safe to resume sexual activity. Keep all follow-up visits. This is important. How is this prevented?  Do not wear tight clothes, such as pantyhose or tight pants. Wear breathable cotton underwear. Do not use douches, perfumed soap, creams, or powders. Wipe from front to back after using the toilet. If you have diabetes, keep your blood sugar  levels under control. Ask your health care provider for other ways to prevent yeast infections. Contact a health care provider if: You have a fever. Your symptoms go away and then return. Your symptoms do not get better with treatment. Your symptoms get worse. You have new symptoms. You develop blisters in or around your vagina. You have blood coming from your vagina and it is not your menstrual period. You develop pain in your abdomen. Summary Vaginal yeast infection is a condition that causes discharge as well as soreness, swelling, and redness (inflammation) of the vagina. This condition is treated with medicine. Medicines may be over-the-counter or prescription. Take or apply over-the-counter and prescription medicines only as told by your health care provider. Do not douche. Resume sexual activity or use of tampons as instructed by your health care provider. Contact a health care provider if your symptoms do not get better with treatment or your symptoms go away and then return. This information is not intended to replace advice given to  you by your health care provider. Make sure you discuss any questions you have with your health care provider. Document Revised: 06/04/2020 Document Reviewed: 06/04/2020 Elsevier Patient Education  2024 Elsevier Inc.   If you have been instructed to have an in-person evaluation today at a local Urgent Care facility, please use the link below. It will take you to a list of all of our available [facility], including address, phone number and hours of operation. Please do not delay care.  [facility]  If you or a family member do not have a primary care provider, use the link below to schedule a visit and establish care. When you choose a [facility] primary care physician or advanced practice provider, you gain a long-term partner in health. Find a Primary Care Provider  Learn more about [facility]'s in-office and virtual  care options: [facility] - Get Care Now

## 2024-01-22 NOTE — Progress Notes (Signed)
 Virtual Visit Consent   Claudia Wall, you are scheduled for a virtual visit with a [facility] provider today. Just as with appointments in the office, your consent must be obtained to participate. Your consent will be active for this visit and any virtual visit you may have with one of our providers in the next 365 days. If you have a MyChart account, a copy of this consent can be sent to you electronically.  As this is a virtual visit, video technology does not allow for your provider to perform a traditional examination. This may limit your provider's ability to fully assess your condition. If your provider identifies any concerns that need to be evaluated in person or the need to arrange testing (such as labs, EKG, etc.), we will make arrangements to do so. Although advances in technology are sophisticated, we cannot ensure that it will always work on either your end or our end. If the connection with a video visit is poor, the visit may have to be switched to a telephone visit. With either a video or telephone visit, we are not always able to ensure that we have a secure connection.  By engaging in this virtual visit, you consent to the provision of healthcare and authorize for your insurance to be billed (if applicable) for the services provided during this visit. Depending on your insurance coverage, you may receive a charge related to this service.  I need to obtain your verbal consent now. Are you willing to proceed with your visit today? [name] has provided verbal consent on 01/22/2024 for a virtual visit (video or telephone). Delon CHRISTELLA Dickinson, PA-C  Date: 01/22/2024 12:24 PM   Virtual Visit via Video Note   I, Delon CHRISTELLA Dickinson, connected with  Claudia Wall  ([id number], 1976/01/10) on 01/22/24 at 12:15 PM EDT by a video-enabled telemedicine application and verified that I am speaking with the correct person using two identifiers.  Location: Patient: Virtual Visit  Location Patient: Home Provider: Virtual Visit Location Provider: Home Office   I discussed the limitations of evaluation and management by telemedicine and the availability of in person appointments. The patient expressed understanding and agreed to proceed.    History of Present Illness: Claudia Wall is a 48 y.o. who identifies as a female who was assigned female at birth, and is being seen today for vaginal discharge and irritation.  HPI: Vaginal Discharge The patient's primary symptoms include genital itching and vaginal discharge. The patient's pertinent negatives include no genital odor or pelvic pain. Primary symptoms comment: burning. This is a recurrent problem. The current episode started in the past 7 days (Sunday, 01/17/24). The problem occurs constantly. The problem has been gradually worsening. The pain is mild (burning). Associated symptoms include dysuria (burning externally at vaginal introitus when urinating). Pertinent negatives include no abdominal pain, back pain, chills, flank pain, frequency or nausea. The vaginal discharge was grey. There has been no bleeding. She has not been passing clots. She has not been passing tissue. Nothing aggravates the symptoms. She has tried nothing for the symptoms. The treatment provided no relief.     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   Anxiety 10/20/2020   Cervical dysplasia 06/09/2019   Uterine  polyp 04/20/2019   Chronic bilateral low back pain without sciatica 09/23/2018   Obesity, Class III, BMI 40-49.9 (morbid obesity) (HCC) 09/23/2018   Pelvic pain 07/18/2018   Acute lumbar myofascial strain 12/29/2017   Chronic post-traumatic stress disorder (PTSD) 12/29/2017   Morbid  obesity (HCC) 11/13/2017   Insomnia due to stress 11/09/2017   Chronic toe pain, left foot 11/09/2017   Other hyperlipidemia 05/13/2017   Vitamin D  deficiency 05/13/2017   Prediabetes 05/13/2017   Enlarged thyroid  04/29/2017   PCOS (polycystic ovarian syndrome) 04/29/2017   Irregular periods/menstrual cycles 02/12/2017   Vaginal dryness 02/12/2017   Carpal tunnel syndrome, right upper limb 07/07/2013   Palpitations 10/27/2011   Dyshidrotic eczema 11/06/2008   Tobacco use disorder 11/01/2008   BMI 45.0-49.9, adult (HCC) 02/03/2007   Essential hypertension 05/28/2006   GERD (gastroesophageal reflux disease) 05/28/2006    Allergies:  Allergies  Allergen Reactions   Tramadol  Itching   Hydrocodone      itching   Oxycodone  Itching   Tylenol  [Acetaminophen ]     itching   Medications:  Current Outpatient Medications:    fluconazole  (DIFLUCAN ) 150 MG tablet, Take 1 tablet (150 mg total) by mouth every 3 (three) days as needed., Disp: 2 tablet, Rfl: 0   metroNIDAZOLE  (METROGEL ) 0.75 % vaginal gel, Place 1 Applicatorful vaginally 2 (two) times daily., Disp: 70 g, Rfl: 0   terconazole (TERAZOL 7) 0.4 % vaginal cream, Place 1 applicator vaginally at bedtime., Disp: 45 g, Rfl: 0   Blood Pressure Monitoring (ADULT BLOOD PRESSURE CUFF LG) KIT, Check your blood pressure once daily. Keep a written log and bring to your next doctor's appointment., Disp: 1 kit, Rfl: 0   gabapentin  (NEURONTIN ) 100 MG capsule, Take 1 capsule (100 mg) at night for three days, if not too sleepy can increase to 100 mg three times a day for nerve pain., Disp: 90 capsule, Rfl: 3   ibuprofen  (ADVIL ) 400 MG tablet, Take 400 mg by mouth every 6 (six) hours as needed., Disp: , Rfl:    LORazepam  (ATIVAN ) 0.5 MG tablet, Take 0.5 mg by mouth every 8 (eight) hours as needed for anxiety., Disp: , Rfl:    metoprolol  succinate (TOPROL  XL) 25 MG 24 hr tablet, Take 1 tablet (25 mg total) by mouth daily., Disp: 90 tablet, Rfl: 1    olmesartan  (BENICAR ) 40 MG tablet, Take 1 tablet (40 mg total) by mouth daily., Disp: 90 tablet, Rfl: 3   polyethylene glycol powder (GLYCOLAX /MIRALAX ) 17 GM/SCOOP powder, Take 17 grams dissolved in liquid by mouth daily., Disp: 510 g, Rfl: 0   spironolactone  (ALDACTONE ) 50 MG tablet, Take 1 tablet (50 mg total) by mouth daily., Disp: 90 tablet, Rfl: 3   tiZANidine  (ZANAFLEX ) 4 MG tablet, Take 1 tablet (4 mg total) by mouth every 6 (six) hours as needed for muscle spasms., Disp: 20 tablet, Rfl: 0   triamcinolone  ointment (KENALOG ) 0.1 %, Apply topically 2 (two) times daily as needed. Avoid the face., Disp: 454 g, Rfl: 0  Observations/Objective: Patient is well-developed, well-nourished in no acute distress.  Resting comfortably at home.  Head is normocephalic, atraumatic.  No labored breathing.  Speech is clear and coherent with logical content.  Patient is alert and oriented at baseline.    Assessment and Plan: 1. Yeast vaginitis (Primary) - fluconazole  (DIFLUCAN ) 150 MG tablet; Take 1 tablet (150 mg total) by mouth every 3 (three) days as needed.  Dispense: 2 tablet; Refill: 0 - terconazole (TERAZOL 7) 0.4 % vaginal cream;  Place 1 applicator vaginally at bedtime.  Dispense: 45 g; Refill: 0  2. BV (bacterial vaginosis) - metroNIDAZOLE  (METROGEL ) 0.75 % vaginal gel; Place 1 Applicatorful vaginally 2 (two) times daily.  Dispense: 70 g; Refill: 0  - Symptoms consistent with yeast vagintis - Fluconazole  and Terconazole cream (to apply to external tissues that are irritated) prescribed - Patient request refill of Metronidazole  gel as she feels this did not fully clear from last treatment, however she does state this discharge does not have any odor - Limit bubble baths, scented lotions/soaps/detergents - Limit tight fitting clothing - Seek on person evaluation if not improving or if symptoms worsen   Follow Up Instructions: I discussed the assessment and treatment plan with the patient.  The patient was provided an opportunity to ask questions and all were answered. The patient agreed with the plan and demonstrated an understanding of the instructions.  A copy of instructions were sent to the patient via MyChart unless otherwise noted below.    The patient was advised to call back or seek an in-person evaluation if the symptoms worsen or if the condition fails to improve as anticipated.    Delon CHRISTELLA Dickinson, PA-C

## 2024-01-22 NOTE — Progress Notes (Signed)
  Because you are having vaginal pain and possible UTI, but have had a new sexual partner, I feel your condition warrants further evaluation and I recommend that you be seen in a face-to-face visit.   NOTE: There will be NO CHARGE for this E-Visit   If you are having a true medical emergency, please call 911.     For an urgent face to face visit, [facility] has multiple urgent care centers for your convenience.  Click the link below for the full list of locations and hours, walk-in wait times, appointment scheduling options and driving directions:  Urgent Care - [place], [place], [place], [place], [place], [place]  [place]     Your MyChart E-visit questionnaire answers were reviewed by a board certified advanced clinical practitioner to complete your personal care plan based on your specific symptoms.    Thank you for using e-Visits.

## 2024-01-26 ENCOUNTER — Encounter: Payer: Self-pay | Admitting: Family

## 2024-01-26 ENCOUNTER — Ambulatory Visit (INDEPENDENT_AMBULATORY_CARE_PROVIDER_SITE_OTHER): Admitting: Family

## 2024-01-26 ENCOUNTER — Other Ambulatory Visit (HOSPITAL_COMMUNITY): Payer: Self-pay

## 2024-01-26 VITALS — BP 110/80 | HR 70 | Temp 98.0°F | Resp 19

## 2024-01-26 DIAGNOSIS — J019 Acute sinusitis, unspecified: Secondary | ICD-10-CM | POA: Diagnosis not present

## 2024-01-26 DIAGNOSIS — R0600 Dyspnea, unspecified: Secondary | ICD-10-CM

## 2024-01-26 DIAGNOSIS — R0789 Other chest pain: Secondary | ICD-10-CM

## 2024-01-26 DIAGNOSIS — L299 Pruritus, unspecified: Secondary | ICD-10-CM

## 2024-01-26 DIAGNOSIS — J310 Chronic rhinitis: Secondary | ICD-10-CM | POA: Diagnosis not present

## 2024-01-26 MED ORDER — FLUCONAZOLE 150 MG PO TABS
ORAL_TABLET | ORAL | 0 refills | Status: DC
  Filled 2024-01-26: qty 2, 6d supply, fill #0

## 2024-01-26 MED ORDER — LEVALBUTEROL TARTRATE 45 MCG/ACT IN AERO
INHALATION_SPRAY | RESPIRATORY_TRACT | 1 refills | Status: AC
  Filled 2024-01-26: qty 15, 30d supply, fill #0
  Filled 2024-03-28: qty 15, 30d supply, fill #1

## 2024-01-26 MED ORDER — AMOXICILLIN-POT CLAVULANATE 875-125 MG PO TABS
1.0000 | ORAL_TABLET | Freq: Two times a day (BID) | ORAL | 0 refills | Status: DC
  Filled 2024-01-26: qty 14, 7d supply, fill #0

## 2024-01-26 MED ORDER — FLUTICASONE PROPIONATE 50 MCG/ACT NA SUSP
NASAL | 2 refills | Status: DC
  Filled 2024-01-26: qty 16, 30d supply, fill #0

## 2024-01-26 NOTE — Patient Instructions (Addendum)
 Prurigo nodularis - failed topical steroids as well as a calcineurin inhibitor - Information provided on Nemluvio previously. - She has failed multiple steroids as well as a calcineurin inhibitor. - Lesions consistent with prurigo nodularis - Continue with moisturizing 1-2 times daily. - Continue to avoid perfumes and scented items including laundry detergent.  2. Acute sinus infection - Start Augmentin 875 mg taking 1 tablet twice a day for 7 days - prescription given for diflucan  in case she develops a yeast infection - Continue Xyzal 5 mg once a day as needed for runny nose/itching/drainage down throat -Start Flonase (fluticasone) 1-2 sprays in each nostril once a day as needed for stuffy nose. In the right nostril, point the applicator out toward the right ear. In the left nostril, point the applicator out toward the left ear  3.Dyspnea/tightness in chest -Your breathing test looks good today and only improved 5% after 4 puffs of Xopenex -Start Xopenex 2 puffs every 4-6 hours as needed for cough, wheeze, tightness in chest, or shortness of breath   Please inform us  of any Emergency Department visits, hospitalizations, or changes in symptoms. Call us  before going to the ED for breathing or allergy  symptoms since we might be able to fit you in for a sick visit. Feel free to contact us  anytime with any questions, problems, or concerns.    Websites that have reliable patient information: 1. American Academy of Asthma, Allergy , and Immunology: www.aaaai.org 2. Food Allergy  Research and Education (FARE): foodallergy.org 3. Mothers of Asthmatics: http://www.asthmacommunitynetwork.org 4. [employer] of Allergy , Asthma, and Immunology: www.acaai.org

## 2024-01-26 NOTE — Progress Notes (Signed)
 [address] [place] [address] Dept: [phone]  FOLLOW UP NOTE  Patient ID: Jon LOISE Ochs, female    DOB: 09/07/1975  Age: 48 y.o. MRN: [id number] Date of Office Visit: 01/26/2024  Assessment  Chief Complaint: Cough (Lots of coughing. Yellow mucous with cough. Mouth dryness. ) and Nasal Congestion (About 2 weeks, itchy face (eyes,nose,ears, & throat))  HPI Claudia Wall is a 48 year old female who presents today for acute visit of possible sinus infection.  She was last seen on July 09, 2023 by Dr. Iva for pruritus, nonallergic rhinitis, prurigo nodularis.  She denies any new diagnosis or surgery since her last office visit.  She reports for the past 2 weeks she has had stuffy nose, itchy nose, itchy runny eyes, ears itching throat itching voice in and out, cough that is productive with yellow's Fudim that is thick and chunky, sneezing, postnasal drip that is yellow in color, sinus pressure, and her nose hurting.  She wonders if her nose is hurting from rubbing her nose.  She denies fever or chills.  She has bodyaches, but reports that they are no more than normal.  She does not know of any known sick contacts.  Last week she took a COVID-19 test and it was negative.  This past Saturday she had a bad headache and took ibuprofen  and it went away.  She did try using Flonase nasal spray Sunday and open her up.  When later on that evening she did have a headache.  She does take Xyzal 5 mg as needed.  She reports most days she will have shortness of breath and tightness in her chest. She will cough every now and then due to a tickle or catch in her breath.She has never been diagnosed with asthma and does not have any inhalers. She quit smoking about 4-5 years ago,but will occasionally smokes a black and mild.  Itching/prurigo nodularis: she reports that she does not have itching every day. She only notices itching when she touches certain fabrics such as in blankets or couches. She  does not ever see a rash when she is itchy. She takes Xyzal 5 mg only when she is itchy. Triamcinolone  does not really work.     Drug Allergies:  Allergies  Allergen Reactions   Tramadol  Itching   Hydrocodone      itching   Oxycodone  Itching   Tylenol  [Acetaminophen ]     itching    Review of Systems: Negative except as per HPI   Physical Exam: BP 110/80   Pulse 70   Temp 98 F (36.7 C)   Resp 19   LMP  (LMP Unknown) Comment: PARTIAL HYSTERECTOMY  SpO2 98%    Physical Exam Constitutional:      Appearance: Normal appearance.  HENT:     Head: Normocephalic and atraumatic.     Comments: Pharynx normal, eyes normal, ears normal, nose: Bilateral lower turbinates moderately edematous and pale with no drainage noted    Right Ear: Tympanic membrane, ear canal and external ear normal.     Left Ear: Tympanic membrane, ear canal and external ear normal.     Mouth/Throat:     Mouth: Mucous membranes are moist.     Pharynx: Oropharynx is clear.  Eyes:     Conjunctiva/sclera: Conjunctivae normal.  Cardiovascular:     Rate and Rhythm: Regular rhythm.     Heart sounds: Normal heart sounds.  Pulmonary:     Effort: Pulmonary effort is normal.  Breath sounds: Normal breath sounds.     Comments: Lungs clear to auscultation Musculoskeletal:     Cervical back: Neck supple.  Skin:    General: Skin is warm.  Neurological:     Mental Status: She is alert and oriented to person, place, and time.  Psychiatric:        Mood and Affect: Mood normal.        Behavior: Behavior normal.        Thought Content: Thought content normal.        Judgment: Judgment normal.     Diagnostics: FVC 2.68 L (92%), FEV1 2.20 L (93%), FEV1/FVC 0.82.  Spirometry indicates normal spirometry.  4 puffs of Xopenex given.  Postbronchodilator response shows 2.72 L (94%), FEV1 2.31 L (98%), FEV1/FVC 0.85.  Spirometry indicates no significant response.  5% change in FEV1.  Normal spirometry  Assessment and  Plan: 1. Acute non-recurrent sinusitis, unspecified location   2. Dyspnea, unspecified type   3. Tightness in chest   4. Non-allergic rhinitis   5. Pruritus     Meds ordered this encounter  Medications   amoxicillin-clavulanate (AUGMENTIN) 875-125 MG tablet    Sig: Take 1 tablet by mouth 2 (two) times daily.    Dispense:  14 tablet    Refill:  0   fluconazole  (DIFLUCAN ) 150 MG tablet    Sig: Take one tablet  at the first sign of symptoms and may repeat in 3 days if needed    Dispense:  2 tablet    Refill:  0   fluticasone (FLONASE) 50 MCG/ACT nasal spray    Sig: Place 1-2 sprays in each nostril once a day as needed for stuffy nose    Dispense:  16 g    Refill:  2   levalbuterol (XOPENEX HFA) 45 MCG/ACT inhaler    Sig: Inhale 2 puffs every 4-6 hours as needed for cough, wheeze, tightness in chest, or shortness of breat    Dispense:  15 g    Refill:  1    Patient Instructions  Prurigo nodularis - failed topical steroids as well as a calcineurin inhibitor - Information provided on Nemluvio previously. - She has failed multiple steroids as well as a calcineurin inhibitor. - Lesions consistent with prurigo nodularis - Continue with moisturizing 1-2 times daily. - Continue to avoid perfumes and scented items including laundry detergent.  2. Acute sinus infection - Start Augmentin 875 mg taking 1 tablet twice a day for 7 days - prescription given for diflucan  in case she develops a yeast infection - Continue Xyzal 5 mg once a day as needed for runny nose/itching/drainage down throat -Start Flonase (fluticasone) 1-2 sprays in each nostril once a day as needed for stuffy nose. In the right nostril, point the applicator out toward the right ear. In the left nostril, point the applicator out toward the left ear  3.Dyspnea/tightness in chest -Your breathing test looks good today and only improved 5% after 4 puffs of Xopenex -Start Xopenex 2 puffs every 4-6 hours as needed for cough,  wheeze, tightness in chest, or shortness of breath   Please inform us  of any Emergency Department visits, hospitalizations, or changes in symptoms. Call us  before going to the ED for breathing or allergy  symptoms since we might be able to fit you in for a sick visit. Feel free to contact us  anytime with any questions, problems, or concerns.    Websites that have reliable patient information: 1. American Academy of Asthma, Allergy ,  and Immunology: www.aaaai.org 2. Food Allergy  Research and Education (FARE): foodallergy.org 3. Mothers of Asthmatics: http://www.asthmacommunitynetwork.org 4. American College of Allergy , Asthma, and Immunology: www.acaai.org           Return in about 3 months (around 04/27/2024), or if symptoms worsen or fail to improve.    Thank you for the opportunity to care for this patient.  Please do not hesitate to contact me with questions.  Wanda Craze, FNP [facility] 

## 2024-01-31 DIAGNOSIS — Z131 Encounter for screening for diabetes mellitus: Secondary | ICD-10-CM | POA: Diagnosis not present

## 2024-01-31 DIAGNOSIS — R5383 Other fatigue: Secondary | ICD-10-CM | POA: Diagnosis not present

## 2024-01-31 DIAGNOSIS — E559 Vitamin D deficiency, unspecified: Secondary | ICD-10-CM | POA: Diagnosis not present

## 2024-02-01 ENCOUNTER — Encounter: Payer: Self-pay | Admitting: Radiology

## 2024-02-01 DIAGNOSIS — E559 Vitamin D deficiency, unspecified: Secondary | ICD-10-CM | POA: Diagnosis not present

## 2024-02-01 DIAGNOSIS — R5383 Other fatigue: Secondary | ICD-10-CM | POA: Diagnosis not present

## 2024-02-01 DIAGNOSIS — Z131 Encounter for screening for diabetes mellitus: Secondary | ICD-10-CM | POA: Diagnosis not present

## 2024-02-03 ENCOUNTER — Other Ambulatory Visit (HOSPITAL_COMMUNITY): Payer: Self-pay

## 2024-02-03 MED ORDER — WEGOVY 0.25 MG/0.5ML ~~LOC~~ SOAJ
0.2500 mg | SUBCUTANEOUS | 0 refills | Status: DC
  Filled 2024-02-03: qty 2, 28d supply, fill #0

## 2024-02-03 NOTE — Addendum Note (Signed)
 Addended by: AZALEA, Nikoloz Huy on: 02/03/2024 11:19 AM   Modules accepted: Orders

## 2024-02-05 ENCOUNTER — Encounter: Payer: Self-pay | Admitting: Internal Medicine

## 2024-02-10 ENCOUNTER — Other Ambulatory Visit (HOSPITAL_COMMUNITY): Payer: Self-pay

## 2024-02-15 ENCOUNTER — Encounter: Payer: Self-pay | Admitting: Family Medicine

## 2024-02-15 ENCOUNTER — Ambulatory Visit: Admitting: Family Medicine

## 2024-02-15 DIAGNOSIS — L235 Allergic contact dermatitis due to other chemical products: Secondary | ICD-10-CM | POA: Diagnosis not present

## 2024-02-15 DIAGNOSIS — L259 Unspecified contact dermatitis, unspecified cause: Secondary | ICD-10-CM | POA: Insufficient documentation

## 2024-02-15 NOTE — Progress Notes (Signed)
   [address] [place] [address] Dept: [phone]  Follow-up Note  RE: Claudia Wall MRN: [id number] DOB: 1975/06/26 Date of Office Visit: 02/15/2024  Primary care provider: Manon Jester, DO Referring provider: Manon Jester, DO   Claudia Wall returns to the office today for the patch test placement, given suspected history of contact dermatitis.  At today's visit, she reports that she  is feeling well overall with no cardiopulmonary, gastrointestinal, or integumentary symptoms.  She has not had any oral steroids over the past 4 weeks.  Clear of patches, specifically keeping patches dry and in place has been discussed.  All questions answered prior to discharge.   Diagnostics: NAC 80 patches placed NAC-80 (1-80)   1. Ammonium persulfate  2. [place] Balsam  3. Omitted  4. 4-tert-Butylphenolformaldehyde resin (PTBP)  5. Bacitracin  6. Budesonide  7. Quaternium-15  8. Cinnamal  9. Cobalt(II) chloride hexahydrate  10. Colophonium  11. Methyldibromo glutaronitrile  12. Decyl Glucoside  13. Ethylenediamine dihydrochloride  14. 2-Hydroxyethyl methacrylate  15. Hydroperoxides of Linalool  16. Iodopropynyl butylcarbamate  17. 2-Mercaptobenzothiazole (MBT)  18. Thiuram mix  19. METHYLISOTHIAZOLINONE  20. Propylene glycol  21. 1,3-Diphenylguanidine  22. Hydroperoxides of Limonene  23. Black rubber mix  24. Carba mix  25. Fragrance mix I  26. Fragrance mix II  27. Textile dye mix II  28. Neomycin sulfate  29. Nickel(II) sulfate hexahydrate  30. p-Phenylenediamine (PPD)  31. Potassium dichromate  32. Propolis  33. Sodium Metabisulfite  34. Tixocortol-21-pivalate  35. Lanolin alcohol  36. Methylisothiazolinone + Methylchloroisothiazolinone  37. Cocamidopropyl betaine  38. 3-(Dimethylamino)-1-propylamine  39. Formaldehyde  40. Oleamidopropyl dimethylamine  41. 2-Bromo-2-Nitropropane-l,3-diol  42. Diazolidinyl urea  43. DMDM Hydantoin  44. Epoxy resin, Bisphenol A   45. Benzophenone-4  46. Imidazolidinyl urea  47. Lauryl polyglucose  48 Methyl methacrylate  49. Paraben mix  50. Mercapto mix  51. Caine mix III  52. Mixed dialkyl thiourea  53. Compositae mix II  54. Toluenesulfonamide formaldehyde resin  55. Tea Tree Oil oxidized  56. Ylang-Ylang oil  57. Amidoamine  58. Amerchol L 101  59. Benzocaine  60. Benzyl alchohol  61. Benzyl salicylate  62. Chloroxylenol (PCMX)  63. Cocamide DEA  64. Clobetasol-17-propionate  65. Toluene-2,5-Diamine sulfate  66. Ethyl acrylate  67. N-Isopropyl-N-phenyl--4-phenylenediamine (IPPD)  68. Lidocaine   69. Omitted  70. Sesquiterpene lactone mix  71. 2-n-Octyl-4-isothiazolin-3-one  72. Propyl gallate  73. Polymyxin B  sulfate  74. Pramoxine hydrochloride  75. Sodium benzoate  76. Sorbitan oleate  77. Sorbitan sesquioleate  78. Tocopherol  79. BENZALKONIUM CHLORIDE  80. Chlorhexidine digluconate    Allergic contact dermatitis - Instructions provided on care of the patches for the next 48 hours. Claudia Wall was instructed to avoid showering for the next 48 hours. Claudia Wall will follow up in 48 hours and 96 hours for patch readings.    Call the clinic if this treatment plan is not working well for you  Follow up in 2 days or sooner if needed.  Thank you for the opportunity to care for this patient.  Please do not hesitate to contact me with questions.  Arlean Mutter, FNP [facility]  [facility]

## 2024-02-15 NOTE — Patient Instructions (Addendum)
   [address] [place] [address] Dept: [phone]  Diagnostics: NAC 80 patches placed NAC-80 (1-80)   1. Ammonium persulfate  2. [place] Balsam  3. Omitted  4. 4-tert-Butylphenolformaldehyde resin (PTBP)  5. Bacitracin  6. Budesonide  7. Quaternium-15  8. Cinnamal  9. Cobalt(II) chloride hexahydrate  10. Colophonium  11. Methyldibromo glutaronitrile  12. Decyl Glucoside  13. Ethylenediamine dihydrochloride  14. 2-Hydroxyethyl methacrylate  15. Hydroperoxides of Linalool  16. Iodopropynyl butylcarbamate  17. 2-Mercaptobenzothiazole (MBT)  18. Thiuram mix  19. METHYLISOTHIAZOLINONE  20. Propylene glycol  21. 1,3-Diphenylguanidine  22. Hydroperoxides of Limonene  23. Black rubber mix  24. Carba mix  25. Fragrance mix I  26. Fragrance mix II  27. Textile dye mix II  28. Neomycin sulfate  29. Nickel(II) sulfate hexahydrate  30. p-Phenylenediamine (PPD)  31. Potassium dichromate  32. Propolis  33. Sodium Metabisulfite  34. Tixocortol-21-pivalate  35. Lanolin alcohol  36. Methylisothiazolinone + Methylchloroisothiazolinone  37. Cocamidopropyl betaine  38. 3-(Dimethylamino)-1-propylamine  39. Formaldehyde  40. Oleamidopropyl dimethylamine  41. 2-Bromo-2-Nitropropane-l,3-diol  42. Diazolidinyl urea  43. DMDM Hydantoin  44. Epoxy resin, Bisphenol A  45. Benzophenone-4  46. Imidazolidinyl urea  47. Lauryl polyglucose  48 Methyl methacrylate  49. Paraben mix  50. Mercapto mix  51. Caine mix III  52. Mixed dialkyl thiourea  53. Compositae mix II  54. Toluenesulfonamide formaldehyde resin  55. Tea Tree Oil oxidized  56. Ylang-Ylang oil  57. Amidoamine  58. Amerchol L 101  59. Benzocaine  60. Benzyl alchohol  61. Benzyl salicylate  62. Chloroxylenol (PCMX)  63. Cocamide DEA  64. Clobetasol-17-propionate  65. Toluene-2,5-Diamine sulfate  66. Ethyl acrylate  67. N-Isopropyl-N-phenyl--4-phenylenediamine (IPPD)  68. Lidocaine   69. Omitted  70.  Sesquiterpene lactone mix  71. 2-n-Octyl-4-isothiazolin-3-one  72. Propyl gallate  73. Polymyxin B  sulfate  74. Pramoxine hydrochloride  75. Sodium benzoate.  1 mL IV alginate will follow-up visit yeah.  This chart.  Normal.  It is lab work then culture.  He is on the last time on  76. Sorbitan oleate  77. Sorbitan sesquioleate  78. Tocopherol  79. BENZALKONIUM CHLORIDE  80. Chlorhexidine digluconate    Allergic contact dermatitis - Instructions provided on care of the patches for the next 48 hours. Bryana Froemming was instructed to avoid showering for the next 48 hours. Everlee Quakenbush will follow up in 48 hours and 96 hours for patch readings.    Call the clinic if this treatment plan is not working well for you  Follow up in 2 days or sooner if needed.

## 2024-02-17 ENCOUNTER — Ambulatory Visit (INDEPENDENT_AMBULATORY_CARE_PROVIDER_SITE_OTHER): Admitting: Family

## 2024-02-17 ENCOUNTER — Encounter: Payer: Self-pay | Admitting: Family

## 2024-02-17 DIAGNOSIS — L235 Allergic contact dermatitis due to other chemical products: Secondary | ICD-10-CM | POA: Diagnosis not present

## 2024-02-17 NOTE — Addendum Note (Signed)
 Addended by: CELESTIA BIBBER D on: 02/17/2024 12:02 PM   Modules accepted: Orders

## 2024-02-17 NOTE — Progress Notes (Signed)
 Follow-up Note  RE: Claudia Wall MRN: @MRN @ DOB: [birth year] Date of Office Visit: 02/17/2024  Primary care provider: Manon Jester, DO Referring provider: Manon Jester, DO   [name] returns to the office today for the initial patch test interpretation, given suspected history of contact dermatitis.    Diagnostics:    NAC 80 48-hour reading:  NAC Panel Tested NAC-80 (1-80)   1. Ammonium persulfate Negative   2. [place] Balsam Negative   3. BENZISOTHIAZOLINONE Comment   omit  4. 4-tert-Butylphenolformaldehyde resin (PTBP) Negative   5. Bacitracin Negative   6. Budesonide Negative   7. Quaternium-15 Negative   8. Cinnamal Negative   9. Cobalt(II) chloride hexahydrate Negative  10. Colophonium Negative   11. Methyldibromo glutaronitrile Negative   12. Decyl Glucoside Negative   13. Ethylenediamine dihydrochloride Negative   14. 2-Hydroxyethyl methacrylate Negative   15. Hydroperoxides of Linalool Negative   16. Iodopropynyl butylcarbamate Negative   17. 2-Mercaptobenzothiazole (MBT) Negative   18. Thiuram mix Negative   19. METHYLISOTHIAZOLINONE Negative   20. Propylene glycol Negative   21. 1,3-Diphenylguanidine Negative   22. Hydroperoxides of Limonene Negative   23. Black rubber mix Negative   24. Carba mix Negative   25. Fragrance mix I Negative   26. Fragrance mix II Negative   27. Textile dye mix II +/-  28. Neomycin sulfate Negative   29. Nickel(II) sulfate hexahydrate Negative  30. p-Phenylenediamine (PPD) Negative   31. Potassium dichromate Negative   32. Propolis Negative   33. Sodium Metabisulfite Negative   34. Tixocortol-21-pivalate Negative   35. Lanolin alcohol Negative   36. Methylisothiazolinone + Methylchloroisothiazolinone Negative   37. Cocamidopropyl betaine Negative   38. 3-(Dimethylamino)-1-propylamine Negative   39. Formaldehyde Negative  40. Oleamidopropyl dimethylamine Negative   41. 2-Bromo-2-Nitropropane-l,3-diol Negative   42.  Diazolidinyl urea Negative  43. DMDM Hydantoin Negative   44. Epoxy resin, Bisphenol A Negative   45. Benzophenone-4 Negative   46. Imidazolidinyl urea Negative   47. Lauryl polyglucose Negative  48 Methyl methacrylate Negative   49. Paraben mix Negative   50. Mercapto mix Negative   51. Caine mix III Negative   52. Mixed dialkyl thiourea Negative   53. Compositae mix II Negative   54. Toluenesulfonamide formaldehyde resin Negative   55. Tea Tree Oil oxidized Negative   56. Ylang-Ylang oil Negative   57. Amidoamine Negative   58. Amerchol L 101 Negative   59. Benzocaine Negative   60. Benzyl alchohol Negative   61. Benzyl salicylate Negative   62. Chloroxylenol (PCMX) Negative   63. Cocamide DEA Negative   64. Clobetasol-17-propionate Negative   65. Toluene-2,5-Diamine sulfate Negative   66. Ethyl acrylate Negative   67. N-Isopropyl-N-phenyl--4-phenylenediamine (IPPD) Negative   68. Lidocaine  Negative   69. Hydroxyisohexyl 3-Cyclohexene Carboxaldehyde Comment   omit  70. Sesquiterpene lactone mix Negative   71. 2-n-Octyl-4-isothiazolin-3-one Negative   72. Propyl gallate Negative   73. Polymyxin B  sulfate Negative   74. Pramoxine hydrochloride Negative   75. Sodium benzoate Negative   76. Sorbitan oleate Negative   77. Sorbitan sesquioleate Negative   78. Tocopherol Negative   79. BENZALKONIUM CHLORIDE Negative   80. Chlorhexidine digluconate Negative     Plan:   Allergic contact dermatitis - The patient has been provided detailed information regarding the substances textile dye mix II is sensitive to, as well as products containing the substances.   - Meticulous avoidance of these substances is recommended.  - If avoidance is not possible, the use  of barrier creams or lotions is recommended. - If symptoms persist or progress despite meticulous avoidance of textile dye mix II, a dermatology referral may be warranted.  Wanda Craze, FNP [facility]

## 2024-02-22 NOTE — Progress Notes (Signed)
 Referring Physician:  Donzetta Rollene BRAVO, MD [address] [place],  [place] [address]  Primary Physician:  Manon Jester, DO  History of Present Illness: 02/29/2024 [name] is here today with a chief complaint of chronic low back pain since 2017 that has become progressively worse.  She states that the pain extends across her lower back and into her buttock.  When she stands for prolonged period of time or increases her movements it will also radiate to the outside of her thighs.  She denies any numbness and tingling down her legs.  She states that sometimes she feels weak secondary to pain, but denies any frank weakness.  She does have some relief in her symptoms when she leans forward on a cart in the store.  No saddle anesthesia.  Does have some left hip pain that radiates to her groin.  She often has muscle spasms that have been unrelieved with tizanidine  at this point in time.  She did do physical therapy for 6 months last year without any relief in her symptoms.      Weakness: none Bowel/Bladder Dysfunction: none  Conservative measures:  Physical therapy:  Has participated in [facility]. 6 months in 2024 Multimodal medical therapy including regular antiinflammatories:  prednisone , tizanidine , gabapentin , Ibuprofen  Injections:  No epidural steroid injections.   The symptoms are causing a significant impact on the patient's life.   Review of Systems:  A 10 point review of systems is negative, except for the pertinent positives and negatives detailed in the HPI.  Past Medical History: Past Medical History:  Diagnosis Date   Alcohol abuse    Hx, occ on weekends   Anxiety    Constipation    Depression    GERD (gastroesophageal reflux disease)    HTN (hypertension)    Irregular periods/menstrual cycles    Joint pain    Lactose intolerance    Leg edema    goes away when props legs up per pt   lower Back pain    Mastitis    h/o right   Obesity    PCOS (polycystic  ovarian syndrome)    Pre-diabetes    PTSD (post-traumatic stress disorder)    Sleep apnea    no cpap used due to cost per pt   Tobacco abuse    Stopped 2021   Uterine fibroid     Past Surgical History: Past Surgical History:  Procedure Laterality Date   CHOLECYSTECTOMY  03/31/2000   INCISION AND DRAINAGE BREAST ABSCESS     yrs ago   IR RADIOLOGIST EVAL & MGMT  04/03/2021   IR RADIOLOGIST EVAL & MGMT  04/26/2021   left carpal tunnel release surgery  05/2018   ROBOTIC ASSISTED LAPAROSCOPIC HYSTERECTOMY AND SALPINGECTOMY Bilateral 09/27/2021   Procedure: XI ROBOTIC ASSISTED LAPAROSCOPIC HYSTERECTOMY AND SALPINGECTOMY;  Surgeon: Barbette Knock, MD;  Location[facility];  Service: Gynecology;  Laterality: Bilateral;    Allergies: Allergies as of 02/29/2024 - Review Complete 02/29/2024  Allergen Reaction Noted   Tramadol  Itching 11/27/2019   Hydrocodone   11/23/2017   Oxycodone  Itching 09/27/2021   Tylenol  [acetaminophen ]  11/23/2017    Medications: Outpatient Encounter Medications as of 02/29/2024  Medication Sig   amoxicillin -clavulanate (AUGMENTIN ) 875-125 MG tablet Take 1 tablet by mouth 2 (two) times daily.   Blood Pressure Monitoring (ADULT BLOOD PRESSURE CUFF LG) KIT Check your blood pressure once daily. Keep a written log and bring to your next doctor's appointment.   fluconazole  (DIFLUCAN ) 150  MG tablet Take 1 tablet (150 mg total) by mouth every 3 (three) days as needed.   fluconazole  (DIFLUCAN ) 150 MG tablet Take one tablet by mouth at the first sign of symptoms and may repeat in 3 days if needed.   fluticasone  (FLONASE ) 50 MCG/ACT nasal spray Place 1-2 sprays in each nostril once a day as needed for stuffy nose   gabapentin  (NEURONTIN ) 100 MG capsule Take 1 capsule (100 mg) at night for three days, if not too sleepy can increase to 100 mg three times a day for nerve pain.   ibuprofen  (ADVIL ) 400 MG tablet Take 400 mg by mouth every 6 (six) hours as needed.    levalbuterol  (XOPENEX  HFA) 45 MCG/ACT inhaler Inhale 2 puffs into the lungs every 4-6 hours as needed for cough, wheeze, tightness in chest, or shortness of breath.   LORazepam  (ATIVAN ) 0.5 MG tablet Take 0.5 mg by mouth every 8 (eight) hours as needed for anxiety.   metoprolol  succinate (TOPROL  XL) 25 MG 24 hr tablet Take 1 tablet (25 mg total) by mouth daily.   metroNIDAZOLE  (METROGEL ) 0.75 % vaginal gel Place 1 Applicatorful vaginally 2 (two) times daily.   olmesartan  (BENICAR ) 40 MG tablet Take 1 tablet (40 mg total) by mouth daily.   polyethylene glycol powder (GLYCOLAX /MIRALAX ) 17 GM/SCOOP powder Take 17 grams dissolved in liquid by mouth daily.   semaglutide -weight management (WEGOVY ) 0.25 MG/0.5ML SOAJ SQ injection Inject 0.25 mg into the skin once a week.   spironolactone  (ALDACTONE ) 50 MG tablet Take 1 tablet (50 mg total) by mouth daily.   terconazole  (TERAZOL 7 ) 0.4 % vaginal cream Place 1 applicator vaginally at bedtime.   tiZANidine  (ZANAFLEX ) 4 MG tablet Take 1 tablet (4 mg total) by mouth every 6 (six) hours as needed for muscle spasms.   triamcinolone  ointment (KENALOG ) 0.1 % Apply topically 2 (two) times daily as needed. Avoid the face.   No facility-administered encounter medications on file as of 02/29/2024.    Social History: Social History   Tobacco Use   Smoking status: Former    Current packs/day: 0.00    Average packs/day: 0.3 packs/day for 25.0 years (6.3 ttl pk-yrs)    Types: Cigarettes    Start date: 65    Quit date: 2022    Years since quitting: 3.9    Passive exposure: Past   Smokeless tobacco: Never   Tobacco comments:    used to smoke 1/2 to 1ppd depending on stress  Vaping Use   Vaping status: Never Used  Substance Use Topics   Alcohol use: Yes    Alcohol/week: 3.0 standard drinks of alcohol    Types: 3 Glasses of wine per week    Comment: 2-3 times a week   Drug use: Not Currently    Family Medical History: Family History  Problem  Relation Age of Onset   Cancer Mother    Hypertension Mother    Heart disease Mother    Depression Mother    Bipolar disorder Mother    Schizophrenia Mother    Alcoholism Mother    Drug abuse Mother    Obesity Mother    Hypertension Father    Diabetes Father    Heart attack Maternal Aunt    Heart failure Maternal Grandmother     Physical Examination: @VITALWITHPAIN @  General: Patient is well developed, well nourished, calm, collected, and in no apparent distress. Attention to examination is appropriate.  Psychiatric: Patient is non-anxious.  Head:  Pupils equal, round, and  reactive to light.  ENT:  Oral mucosa appears well hydrated.  Neck:   Supple.  Full range of motion.  Respiratory: Patient is breathing without any difficulty.  Extremities: No edema.  Vascular: Palpable dorsal pedal pulses.  Skin:   On exposed skin, there are no abnormal skin lesions.  NEUROLOGICAL:     Awake, alert, oriented to person, place, and time.  Speech is clear and fluent. Fund of knowledge is appropriate.   Cranial Nerves: Pupils equal round and reactive to light.  Facial tone is symmetric.   ROM of spine: Patient has tenderness palpation of lumbar paraspinals  -SLR   Strength:  Side Iliopsoas Quads Hamstring PF DF EHL  R 5 5 5 5 5 5   L 5 5 5 5 5 5   Diffusely hyporeflexic throughout bilateral lower extremities.  Does have some pain with internal rotation of left hip. Clonus is not present. Gait is normal.   No difficulty with tandem gait.   No evidence of dysmetria noted.  Medical Decision Making  Imaging: EXAM: MRI LUMBAR SPINE WITHOUT CONTRAST   TECHNIQUE: Multiplanar, multisequence MR imaging of the lumbar spine was performed. No intravenous contrast was administered.   COMPARISON:  None Available.   FINDINGS: Segmentation:  Standard.   Alignment:  Preserved.   Vertebrae: Vertebral body heights are maintained. Mild marrow edema likely associated with right  L5-S1 facet arthropathy. No suspicious osseous lesion.   Conus medullaris and cauda equina: Conus extends to the L2 level. Conus and cauda equina appear normal.   Paraspinal and other soft tissues: Unremarkable.   Disc levels: Disc bulge and facet arthropathy at T11-T12. Minor canal stenosis. Mild right foraminal stenosis.   L1-L2:  Facet arthropathy.  No canal or foraminal stenosis.   L2-L3:  Facet arthropathy.  No canal or foraminal stenosis.   L3-L4: Facet arthropathy. No canal stenosis. Minor foraminal stenosis.   L4-L5: Facet arthropathy. No canal stenosis. Minor foraminal stenosis.   L5-S1: Moderate facet arthropathy. No canal stenosis. Mild foraminal stenosis.   IMPRESSION: Facet predominant degenerative changes are greatest at L5-S1. No high-grade stenosis.  I have personally reviewed the images and agree with the above interpretation.  Assessment and Plan: Ms. Yousuf is a pleasant 48 y.o. female is here today with a chief complaint of chronic low back pain since 2017 that has become progressively worse.  She has known facet arthropathy last seen on MRI 2 years ago.  She states that the pain extends across her lower back and into her buttock.  When she stands for prolonged period of time or increases her movements it will also radiate to the outside of her thighs.  She denies any numbness and tingling down her legs.  On examination, this of some tenderness to the lumbar paraspinals.  Negative straight leg raise.  No significant frank weakness.  She did have some pain with internal rotation of her left hip.  Some concern for arthritic pain versus left hip pathology.  Plan includes the following moving forward:  -X-rays today of patient's left hip as well as her lumbar spine with extension and flexion views - MRI of lumbar spine order has been placed - Patient is allergic to acetaminophen , but we plan to trial meloxicam .  The risk and benefits of this medication were  discussed at length.  Advised her to not take this with any other NSAID. - Patient does not feel as though tizanidine  is working well for her muscle spasms that she is having that are  very intense.  Baclofen  has been ordered for her to trial.  Risks of this medication including drowsiness were discussed at length. - Will review results once complete.  Plan to see back in approximately 8 weeks.    Thank you for involving me in the care of this patient.    Lyle Decamp, PA-C Dept. of Neurosurgery

## 2024-02-29 ENCOUNTER — Other Ambulatory Visit (HOSPITAL_COMMUNITY): Payer: Self-pay

## 2024-02-29 ENCOUNTER — Ambulatory Visit: Admitting: Physician Assistant

## 2024-02-29 ENCOUNTER — Encounter: Payer: Self-pay | Admitting: Physician Assistant

## 2024-02-29 ENCOUNTER — Ambulatory Visit

## 2024-02-29 VITALS — BP 138/82 | Wt 253.0 lb

## 2024-02-29 DIAGNOSIS — G8929 Other chronic pain: Secondary | ICD-10-CM | POA: Diagnosis not present

## 2024-02-29 DIAGNOSIS — M25552 Pain in left hip: Secondary | ICD-10-CM

## 2024-02-29 DIAGNOSIS — M47816 Spondylosis without myelopathy or radiculopathy, lumbar region: Secondary | ICD-10-CM | POA: Diagnosis not present

## 2024-02-29 DIAGNOSIS — M545 Low back pain, unspecified: Secondary | ICD-10-CM

## 2024-02-29 DIAGNOSIS — M47815 Spondylosis without myelopathy or radiculopathy, thoracolumbar region: Secondary | ICD-10-CM | POA: Diagnosis not present

## 2024-02-29 DIAGNOSIS — M160 Bilateral primary osteoarthritis of hip: Secondary | ICD-10-CM | POA: Diagnosis not present

## 2024-02-29 MED ORDER — BACLOFEN 10 MG PO TABS
10.0000 mg | ORAL_TABLET | Freq: Three times a day (TID) | ORAL | 1 refills | Status: AC
  Filled 2024-02-29: qty 90, 30d supply, fill #0
  Filled 2024-03-28: qty 90, 30d supply, fill #1

## 2024-02-29 MED ORDER — MELOXICAM 7.5 MG PO TABS
15.0000 mg | ORAL_TABLET | Freq: Every day | ORAL | 1 refills | Status: AC
  Filled 2024-02-29: qty 60, 30d supply, fill #0
  Filled 2024-03-28: qty 60, 30d supply, fill #1

## 2024-03-01 ENCOUNTER — Other Ambulatory Visit (HOSPITAL_COMMUNITY): Payer: Self-pay

## 2024-03-04 ENCOUNTER — Encounter: Payer: Self-pay | Admitting: Physician Assistant

## 2024-03-09 ENCOUNTER — Other Ambulatory Visit: Payer: Self-pay | Admitting: Physician Assistant

## 2024-03-09 ENCOUNTER — Ambulatory Visit: Payer: Self-pay | Admitting: Physician Assistant

## 2024-03-09 DIAGNOSIS — M25552 Pain in left hip: Secondary | ICD-10-CM

## 2024-03-09 NOTE — Progress Notes (Signed)
Ortho referral sent 

## 2024-03-18 ENCOUNTER — Telehealth: Payer: Self-pay

## 2024-03-18 ENCOUNTER — Other Ambulatory Visit

## 2024-03-18 ENCOUNTER — Ambulatory Visit

## 2024-03-18 DIAGNOSIS — M47816 Spondylosis without myelopathy or radiculopathy, lumbar region: Secondary | ICD-10-CM | POA: Diagnosis not present

## 2024-03-18 DIAGNOSIS — M48061 Spinal stenosis, lumbar region without neurogenic claudication: Secondary | ICD-10-CM | POA: Diagnosis not present

## 2024-03-18 DIAGNOSIS — M545 Low back pain, unspecified: Secondary | ICD-10-CM

## 2024-03-18 NOTE — Telephone Encounter (Signed)
 LVM for pt to cb to r/s appt with [facility]

## 2024-04-06 ENCOUNTER — Other Ambulatory Visit (HOSPITAL_COMMUNITY): Payer: Self-pay

## 2024-04-06 ENCOUNTER — Other Ambulatory Visit: Payer: Self-pay

## 2024-04-06 MED ORDER — LEVOCETIRIZINE DIHYDROCHLORIDE 5 MG PO TABS
5.0000 mg | ORAL_TABLET | Freq: Every evening | ORAL | 3 refills | Status: AC
  Filled 2024-04-06 – 2024-05-03 (×2): qty 90, 90d supply, fill #0

## 2024-04-06 NOTE — Progress Notes (Signed)
 Per Dr. Iva, sending in Levocetirizine to [facility].

## 2024-04-16 ENCOUNTER — Other Ambulatory Visit (HOSPITAL_COMMUNITY): Payer: Self-pay

## 2024-04-26 ENCOUNTER — Encounter (HOSPITAL_BASED_OUTPATIENT_CLINIC_OR_DEPARTMENT_OTHER): Payer: Self-pay | Admitting: Cardiovascular Disease

## 2024-04-27 ENCOUNTER — Ambulatory Visit (HOSPITAL_BASED_OUTPATIENT_CLINIC_OR_DEPARTMENT_OTHER): Admitting: Cardiovascular Disease

## 2024-04-27 ENCOUNTER — Encounter (HOSPITAL_BASED_OUTPATIENT_CLINIC_OR_DEPARTMENT_OTHER): Payer: Self-pay | Admitting: Cardiovascular Disease

## 2024-04-27 VITALS — BP 122/76 | HR 87 | Ht 63.0 in | Wt 259.1 lb

## 2024-04-27 DIAGNOSIS — Z6841 Body Mass Index (BMI) 40.0 and over, adult: Secondary | ICD-10-CM | POA: Diagnosis not present

## 2024-04-27 DIAGNOSIS — G4733 Obstructive sleep apnea (adult) (pediatric): Secondary | ICD-10-CM

## 2024-04-27 DIAGNOSIS — Z5941 Food insecurity: Secondary | ICD-10-CM | POA: Diagnosis not present

## 2024-04-27 DIAGNOSIS — R739 Hyperglycemia, unspecified: Secondary | ICD-10-CM | POA: Diagnosis not present

## 2024-04-27 DIAGNOSIS — E8881 Metabolic syndrome: Secondary | ICD-10-CM

## 2024-04-27 DIAGNOSIS — Z5181 Encounter for therapeutic drug level monitoring: Secondary | ICD-10-CM

## 2024-04-27 DIAGNOSIS — I10 Essential (primary) hypertension: Secondary | ICD-10-CM

## 2024-04-27 NOTE — Patient Instructions (Addendum)
 Medication Instructions:  Your physician recommends that you continue on your current medications as directed. Please refer to the Current Medication list given to you today.   *If you need a refill on your cardiac medications before your next appointment, please call your pharmacy*  Lab Work: FASTING LIPID/CMET/A1C SOON   If you have labs (blood work) drawn today and your tests are completely normal, you will receive your results only by: MyChart Message (if you have MyChart) OR A paper copy in the mail If you have any lab test that is abnormal or we need to change your treatment, we will call you to review the results.  Testing/Procedures: NONE  Follow-Up: At [facility], you and your health needs are our priority.  As part of our continuing mission to provide you with exceptional heart care, our providers are all part of one team.  This team includes your primary Cardiologist (physician) and Advanced Practice Providers or APPs (Physician Assistants and Nurse Practitioners) who all work together to provide you with the care you need, when you need it.  Your next appointment:   12 month(s)  Provider:   Annabella Scarce, MD, Rosaline Bane, NP, or Reche Finder, NP    SOCIAL WORKER WILL REACH OUT TO YOU   We recommend signing up for the patient portal called MyChart.  Sign up information is provided on this After Visit Summary.  MyChart is used to connect with patients for Virtual Visits (Telemedicine).  Patients are able to view lab/test results, encounter notes, upcoming appointments, etc.  Non-urgent messages can be sent to your provider as well.   To learn more about what you can do with MyChart, go to [email or web address].   Other Instructions

## 2024-04-28 ENCOUNTER — Other Ambulatory Visit (HOSPITAL_BASED_OUTPATIENT_CLINIC_OR_DEPARTMENT_OTHER): Payer: Self-pay | Admitting: Cardiovascular Disease

## 2024-04-28 ENCOUNTER — Telehealth (HOSPITAL_BASED_OUTPATIENT_CLINIC_OR_DEPARTMENT_OTHER): Payer: Self-pay | Admitting: Licensed Clinical Social Worker

## 2024-04-28 LAB — COMPREHENSIVE METABOLIC PANEL WITH GFR
ALT: 23 [IU]/L (ref 0–32)
AST: 21 [IU]/L (ref 0–40)
Albumin: 4.3 g/dL (ref 3.9–4.9)
Alkaline Phosphatase: 78 [IU]/L (ref 41–116)
BUN/Creatinine Ratio: 15 (ref 9–23)
BUN: 16 mg/dL (ref 6–24)
Bilirubin Total: 0.4 mg/dL (ref 0.0–1.2)
CO2: 21 mmol/L (ref 20–29)
Calcium: 10.2 mg/dL (ref 8.7–10.2)
Chloride: 102 mmol/L (ref 96–106)
Creatinine, Ser: 1.04 mg/dL — ABNORMAL HIGH (ref 0.57–1.00)
Globulin, Total: 3.4 g/dL (ref 1.5–4.5)
Glucose: 105 mg/dL — ABNORMAL HIGH (ref 70–99)
Potassium: 4.8 mmol/L (ref 3.5–5.2)
Sodium: 138 mmol/L (ref 134–144)
Total Protein: 7.7 g/dL (ref 6.0–8.5)
eGFR: 66 mL/min/{1.73_m2}

## 2024-04-28 LAB — LIPID PANEL
Chol/HDL Ratio: 3.4 ratio (ref 0.0–4.4)
Cholesterol, Total: 179 mg/dL (ref 100–199)
HDL: 53 mg/dL
LDL Chol Calc (NIH): 111 mg/dL — ABNORMAL HIGH (ref 0–99)
Triglycerides: 80 mg/dL (ref 0–149)
VLDL Cholesterol Cal: 15 mg/dL (ref 5–40)

## 2024-04-28 LAB — HEMOGLOBIN A1C
Est. average glucose Bld gHb Est-mCnc: 131 mg/dL
Hgb A1c MFr Bld: 6.2 % — ABNORMAL HIGH (ref 4.8–5.6)

## 2024-04-28 NOTE — Telephone Encounter (Signed)
 H&V Care Navigation CSW Progress Note  Clinical Social Worker contacted patient by phone to f/u on patient assistance request. No answer this morning at [phone], left voicemail. Will re-attempt again as able.  Patient is participating in a Managed Medicaid Plan:  No, Engineer, building services only  SDOH Screenings   Food Insecurity: No Food Insecurity (09/05/2022)   Received from [employer] Needs: No Transportation Needs (09/05/2022)   Received from [facility]  Utilities: Not At Risk (09/05/2022)   Received from [facility]  Depression (PHQ2-9): High Risk (01/11/2024)  Financial Resource Strain: Low Risk (09/05/2022)   Received from [facility]  Tobacco Use: High Risk (04/27/2024)    [name], MSW, LCSW Clinical Social Worker II [facility] Heart/Vascular Care Navigation  [phone]- work cell phone (preferred)

## 2024-04-30 ENCOUNTER — Ambulatory Visit: Payer: Self-pay | Admitting: Cardiovascular Disease

## 2024-04-30 ENCOUNTER — Encounter (HOSPITAL_BASED_OUTPATIENT_CLINIC_OR_DEPARTMENT_OTHER): Payer: Self-pay | Admitting: Cardiovascular Disease

## 2024-04-30 DIAGNOSIS — E8881 Metabolic syndrome: Secondary | ICD-10-CM | POA: Insufficient documentation

## 2024-05-02 ENCOUNTER — Telehealth: Payer: Self-pay | Admitting: Licensed Clinical Social Worker

## 2024-05-02 ENCOUNTER — Other Ambulatory Visit (HOSPITAL_BASED_OUTPATIENT_CLINIC_OR_DEPARTMENT_OTHER): Payer: Self-pay | Admitting: *Deleted

## 2024-05-02 ENCOUNTER — Other Ambulatory Visit (HOSPITAL_COMMUNITY): Payer: Self-pay

## 2024-05-02 MED ORDER — OLMESARTAN MEDOXOMIL 40 MG PO TABS
40.0000 mg | ORAL_TABLET | Freq: Every day | ORAL | 3 refills | Status: AC
  Filled 2024-05-02: qty 90, 90d supply, fill #0

## 2024-05-02 NOTE — Telephone Encounter (Signed)
 H&V Care Navigation CSW Progress Note  Clinical Social Worker contacted patient by phone to f/u on patient assistance request. No answer this afternoon at [phone], left additional voicemail. Remain available should pt return calls.    Patient is participating in a Managed Medicaid Plan:  No,[employer] commercial  SDOH Screenings   Food Insecurity: No Food Insecurity (09/05/2022)   Received from [employer] Needs: No Transportation Needs (09/05/2022)   Received from [facility]  Utilities: Not At Risk (09/05/2022)   Received from [facility]  Depression (PHQ2-9): High Risk (01/11/2024)  Financial Resource Strain: Low Risk (09/05/2022)   Received from [facility]  Tobacco Use: High Risk (04/30/2024)    [name], MSW, LCSW Clinical Social Worker II [facility] Heart/Vascular Care Navigation  [phone]- work cell phone (preferred)

## 2024-05-03 ENCOUNTER — Other Ambulatory Visit (HOSPITAL_COMMUNITY): Payer: Self-pay

## 2024-05-04 LAB — LIPID PANEL

## 2024-05-04 LAB — COMPREHENSIVE METABOLIC PANEL WITH GFR

## 2024-05-04 LAB — HEMOGLOBIN A1C

## 2024-05-06 ENCOUNTER — Other Ambulatory Visit (HOSPITAL_COMMUNITY): Payer: Self-pay

## 2024-05-06 ENCOUNTER — Ambulatory Visit: Payer: Self-pay

## 2024-05-06 VITALS — BP 121/74 | HR 73 | Ht 63.0 in | Wt 257.8 lb

## 2024-05-06 DIAGNOSIS — G4733 Obstructive sleep apnea (adult) (pediatric): Secondary | ICD-10-CM

## 2024-05-06 DIAGNOSIS — F411 Generalized anxiety disorder: Secondary | ICD-10-CM

## 2024-05-06 DIAGNOSIS — G4700 Insomnia, unspecified: Secondary | ICD-10-CM

## 2024-05-06 MED ORDER — ESCITALOPRAM OXALATE 5 MG PO TABS
5.0000 mg | ORAL_TABLET | Freq: Every day | ORAL | 1 refills | Status: AC
  Filled 2024-05-06: qty 30, 30d supply, fill #0

## 2024-05-06 MED ORDER — ZEPBOUND 2.5 MG/0.5ML ~~LOC~~ SOAJ
2.5000 mg | SUBCUTANEOUS | 1 refills | Status: AC
  Filled 2024-05-06: qty 2, 28d supply, fill #0

## 2024-05-06 MED ORDER — MELATONIN 3 MG PO CAPS
3.0000 mg | ORAL_CAPSULE | Freq: Every day | ORAL | 1 refills | Status: AC
  Filled 2024-05-06: qty 30, 30d supply, fill #0

## 2024-05-06 NOTE — Progress Notes (Unsigned)
" ° ° °  SUBJECTIVE:   CHIEF COMPLAINT / HPI:   ***  PERTINENT  PMH / PSH: ***  OBJECTIVE:   BP 121/74   Pulse 73   Ht 5' 3 (1.6 m)   Wt 257 lb 12.8 oz (116.9 kg)   LMP  (LMP Unknown) Comment: PARTIAL HYSTERECTOMY  SpO2 98%   BMI 45.67 kg/m   ***  ASSESSMENT/PLAN:   Assessment & Plan Obstructive sleep apnea  Generalized anxiety disorder Lexapro  5mg  Further f/u, consider therapy Insomnia, unspecified type Melatonin Sleep hygiene OSA treatment f/u     Leafy Scriver, DO [facility] "

## 2024-06-03 ENCOUNTER — Ambulatory Visit: Payer: Self-pay

## 2024-08-04 ENCOUNTER — Ambulatory Visit (HOSPITAL_BASED_OUTPATIENT_CLINIC_OR_DEPARTMENT_OTHER): Admitting: Cardiovascular Disease
